# Patient Record
Sex: Female | Born: 1935
Health system: Southern US, Community
[De-identification: ages and names within clinical notes are randomized; demographics above are authoritative.]

## PROBLEM LIST (undated history)

## (undated) DIAGNOSIS — C439 Malignant melanoma of skin, unspecified: Secondary | ICD-10-CM

## (undated) DIAGNOSIS — D45 Polycythemia vera: Secondary | ICD-10-CM

## (undated) DIAGNOSIS — I10 Essential (primary) hypertension: Secondary | ICD-10-CM

## (undated) DIAGNOSIS — M810 Age-related osteoporosis without current pathological fracture: Secondary | ICD-10-CM

## (undated) DIAGNOSIS — K219 Gastro-esophageal reflux disease without esophagitis: Secondary | ICD-10-CM

## (undated) DIAGNOSIS — J45909 Unspecified asthma, uncomplicated: Secondary | ICD-10-CM

## (undated) DIAGNOSIS — E7800 Pure hypercholesterolemia, unspecified: Secondary | ICD-10-CM

## (undated) DIAGNOSIS — G4730 Sleep apnea, unspecified: Secondary | ICD-10-CM

## (undated) DIAGNOSIS — E039 Hypothyroidism, unspecified: Secondary | ICD-10-CM

## (undated) DIAGNOSIS — C4490 Unspecified malignant neoplasm of skin, unspecified: Secondary | ICD-10-CM

## (undated) HISTORY — PX: CHOLECYSTECTOMY: SHX55

## (undated) HISTORY — PX: EYE SURGERY: SHX253

## (undated) HISTORY — DX: Age-related osteoporosis without current pathological fracture: M81.0

## (undated) HISTORY — DX: Unspecified malignant neoplasm of skin, unspecified: C44.90

---

## 1986-07-12 HISTORY — PX: BREAST EXCISIONAL BIOPSY: SUR124

## 2001-07-12 HISTORY — PX: BREAST BIOPSY: SHX20

## 2004-07-10 ENCOUNTER — Ambulatory Visit: Payer: Self-pay

## 2005-04-22 ENCOUNTER — Ambulatory Visit: Payer: Self-pay | Admitting: Internal Medicine

## 2005-07-13 ENCOUNTER — Ambulatory Visit: Payer: Self-pay

## 2006-07-19 ENCOUNTER — Ambulatory Visit: Payer: Self-pay | Admitting: Internal Medicine

## 2007-07-27 ENCOUNTER — Ambulatory Visit: Payer: Self-pay | Admitting: Internal Medicine

## 2007-10-11 ENCOUNTER — Ambulatory Visit: Payer: Self-pay | Admitting: Unknown Physician Specialty

## 2008-05-10 ENCOUNTER — Ambulatory Visit: Payer: Self-pay | Admitting: Internal Medicine

## 2008-07-30 ENCOUNTER — Ambulatory Visit: Payer: Self-pay | Admitting: Internal Medicine

## 2009-07-31 ENCOUNTER — Ambulatory Visit: Payer: Self-pay | Admitting: Internal Medicine

## 2010-05-04 ENCOUNTER — Ambulatory Visit: Payer: Self-pay | Admitting: Otolaryngology

## 2010-08-03 ENCOUNTER — Ambulatory Visit: Payer: Self-pay | Admitting: Internal Medicine

## 2011-07-08 ENCOUNTER — Ambulatory Visit: Payer: Self-pay

## 2011-07-16 ENCOUNTER — Ambulatory Visit: Payer: Self-pay

## 2011-07-16 DIAGNOSIS — Z79899 Other long term (current) drug therapy: Secondary | ICD-10-CM | POA: Diagnosis not present

## 2011-07-16 DIAGNOSIS — M810 Age-related osteoporosis without current pathological fracture: Secondary | ICD-10-CM | POA: Diagnosis not present

## 2011-07-16 DIAGNOSIS — N8501 Benign endometrial hyperplasia: Secondary | ICD-10-CM | POA: Diagnosis not present

## 2011-07-16 DIAGNOSIS — G4730 Sleep apnea, unspecified: Secondary | ICD-10-CM | POA: Diagnosis not present

## 2011-07-16 DIAGNOSIS — D269 Other benign neoplasm of uterus, unspecified: Secondary | ICD-10-CM | POA: Diagnosis not present

## 2011-07-16 DIAGNOSIS — Z85820 Personal history of malignant melanoma of skin: Secondary | ICD-10-CM | POA: Diagnosis not present

## 2011-07-16 DIAGNOSIS — E039 Hypothyroidism, unspecified: Secondary | ICD-10-CM | POA: Diagnosis not present

## 2011-07-16 DIAGNOSIS — N840 Polyp of corpus uteri: Secondary | ICD-10-CM | POA: Diagnosis not present

## 2011-07-16 DIAGNOSIS — I10 Essential (primary) hypertension: Secondary | ICD-10-CM | POA: Diagnosis not present

## 2011-07-16 DIAGNOSIS — N879 Dysplasia of cervix uteri, unspecified: Secondary | ICD-10-CM | POA: Diagnosis not present

## 2011-07-16 DIAGNOSIS — J45909 Unspecified asthma, uncomplicated: Secondary | ICD-10-CM | POA: Diagnosis not present

## 2011-07-20 LAB — PATHOLOGY REPORT

## 2011-08-10 ENCOUNTER — Ambulatory Visit: Payer: Self-pay | Admitting: Internal Medicine

## 2011-08-10 DIAGNOSIS — Z1231 Encounter for screening mammogram for malignant neoplasm of breast: Secondary | ICD-10-CM | POA: Diagnosis not present

## 2011-08-24 DIAGNOSIS — J301 Allergic rhinitis due to pollen: Secondary | ICD-10-CM | POA: Diagnosis not present

## 2011-08-24 DIAGNOSIS — E039 Hypothyroidism, unspecified: Secondary | ICD-10-CM | POA: Diagnosis not present

## 2011-08-24 DIAGNOSIS — E782 Mixed hyperlipidemia: Secondary | ICD-10-CM | POA: Diagnosis not present

## 2011-08-24 DIAGNOSIS — K219 Gastro-esophageal reflux disease without esophagitis: Secondary | ICD-10-CM | POA: Diagnosis not present

## 2011-08-24 DIAGNOSIS — J45909 Unspecified asthma, uncomplicated: Secondary | ICD-10-CM | POA: Diagnosis not present

## 2011-08-27 DIAGNOSIS — D239 Other benign neoplasm of skin, unspecified: Secondary | ICD-10-CM | POA: Diagnosis not present

## 2011-08-27 DIAGNOSIS — Z85820 Personal history of malignant melanoma of skin: Secondary | ICD-10-CM | POA: Diagnosis not present

## 2011-08-27 DIAGNOSIS — D1800 Hemangioma unspecified site: Secondary | ICD-10-CM | POA: Diagnosis not present

## 2011-09-23 DIAGNOSIS — G4730 Sleep apnea, unspecified: Secondary | ICD-10-CM | POA: Diagnosis not present

## 2011-09-23 DIAGNOSIS — G4710 Hypersomnia, unspecified: Secondary | ICD-10-CM | POA: Diagnosis not present

## 2011-09-23 DIAGNOSIS — G4720 Circadian rhythm sleep disorder, unspecified type: Secondary | ICD-10-CM | POA: Diagnosis not present

## 2011-10-15 DIAGNOSIS — L570 Actinic keratosis: Secondary | ICD-10-CM | POA: Diagnosis not present

## 2011-10-25 DIAGNOSIS — J449 Chronic obstructive pulmonary disease, unspecified: Secondary | ICD-10-CM | POA: Diagnosis not present

## 2011-10-25 DIAGNOSIS — R0602 Shortness of breath: Secondary | ICD-10-CM | POA: Diagnosis not present

## 2011-11-22 DIAGNOSIS — G4720 Circadian rhythm sleep disorder, unspecified type: Secondary | ICD-10-CM | POA: Diagnosis not present

## 2011-11-22 DIAGNOSIS — G4730 Sleep apnea, unspecified: Secondary | ICD-10-CM | POA: Diagnosis not present

## 2011-11-22 DIAGNOSIS — J449 Chronic obstructive pulmonary disease, unspecified: Secondary | ICD-10-CM | POA: Diagnosis not present

## 2011-11-22 DIAGNOSIS — G4710 Hypersomnia, unspecified: Secondary | ICD-10-CM | POA: Diagnosis not present

## 2011-11-24 DIAGNOSIS — E2839 Other primary ovarian failure: Secondary | ICD-10-CM | POA: Diagnosis not present

## 2011-11-24 DIAGNOSIS — M810 Age-related osteoporosis without current pathological fracture: Secondary | ICD-10-CM | POA: Diagnosis not present

## 2011-11-25 DIAGNOSIS — Z85820 Personal history of malignant melanoma of skin: Secondary | ICD-10-CM | POA: Diagnosis not present

## 2011-11-25 DIAGNOSIS — L906 Striae atrophicae: Secondary | ICD-10-CM | POA: Diagnosis not present

## 2011-12-04 DIAGNOSIS — L02419 Cutaneous abscess of limb, unspecified: Secondary | ICD-10-CM | POA: Diagnosis not present

## 2011-12-04 DIAGNOSIS — L03119 Cellulitis of unspecified part of limb: Secondary | ICD-10-CM | POA: Diagnosis not present

## 2011-12-05 DIAGNOSIS — T300 Burn of unspecified body region, unspecified degree: Secondary | ICD-10-CM | POA: Diagnosis not present

## 2011-12-05 DIAGNOSIS — L259 Unspecified contact dermatitis, unspecified cause: Secondary | ICD-10-CM | POA: Diagnosis not present

## 2011-12-08 DIAGNOSIS — T148 Other injury of unspecified body region: Secondary | ICD-10-CM | POA: Diagnosis not present

## 2011-12-27 DIAGNOSIS — K219 Gastro-esophageal reflux disease without esophagitis: Secondary | ICD-10-CM | POA: Diagnosis not present

## 2011-12-27 DIAGNOSIS — I10 Essential (primary) hypertension: Secondary | ICD-10-CM | POA: Diagnosis not present

## 2011-12-27 DIAGNOSIS — E039 Hypothyroidism, unspecified: Secondary | ICD-10-CM | POA: Diagnosis not present

## 2011-12-27 DIAGNOSIS — E782 Mixed hyperlipidemia: Secondary | ICD-10-CM | POA: Diagnosis not present

## 2012-01-03 DIAGNOSIS — G4730 Sleep apnea, unspecified: Secondary | ICD-10-CM | POA: Diagnosis not present

## 2012-01-03 DIAGNOSIS — J029 Acute pharyngitis, unspecified: Secondary | ICD-10-CM | POA: Diagnosis not present

## 2012-01-03 DIAGNOSIS — E782 Mixed hyperlipidemia: Secondary | ICD-10-CM | POA: Diagnosis not present

## 2012-01-03 DIAGNOSIS — G4710 Hypersomnia, unspecified: Secondary | ICD-10-CM | POA: Diagnosis not present

## 2012-01-03 DIAGNOSIS — I719 Aortic aneurysm of unspecified site, without rupture: Secondary | ICD-10-CM | POA: Diagnosis not present

## 2012-01-03 DIAGNOSIS — D45 Polycythemia vera: Secondary | ICD-10-CM | POA: Diagnosis not present

## 2012-01-10 ENCOUNTER — Ambulatory Visit: Payer: Self-pay | Admitting: Internal Medicine

## 2012-01-10 DIAGNOSIS — D751 Secondary polycythemia: Secondary | ICD-10-CM | POA: Diagnosis not present

## 2012-01-11 ENCOUNTER — Ambulatory Visit: Payer: Self-pay | Admitting: Internal Medicine

## 2012-01-11 DIAGNOSIS — E785 Hyperlipidemia, unspecified: Secondary | ICD-10-CM | POA: Diagnosis not present

## 2012-01-11 DIAGNOSIS — D582 Other hemoglobinopathies: Secondary | ICD-10-CM | POA: Diagnosis not present

## 2012-01-11 DIAGNOSIS — D751 Secondary polycythemia: Secondary | ICD-10-CM | POA: Diagnosis not present

## 2012-01-11 DIAGNOSIS — E039 Hypothyroidism, unspecified: Secondary | ICD-10-CM | POA: Diagnosis not present

## 2012-01-11 DIAGNOSIS — M810 Age-related osteoporosis without current pathological fracture: Secondary | ICD-10-CM | POA: Diagnosis not present

## 2012-01-11 DIAGNOSIS — G4730 Sleep apnea, unspecified: Secondary | ICD-10-CM | POA: Diagnosis not present

## 2012-01-11 DIAGNOSIS — I10 Essential (primary) hypertension: Secondary | ICD-10-CM | POA: Diagnosis not present

## 2012-01-11 DIAGNOSIS — Z79899 Other long term (current) drug therapy: Secondary | ICD-10-CM | POA: Diagnosis not present

## 2012-01-11 DIAGNOSIS — J449 Chronic obstructive pulmonary disease, unspecified: Secondary | ICD-10-CM | POA: Diagnosis not present

## 2012-01-11 LAB — CBC CANCER CENTER
Basophil %: 0.7 %
Eosinophil %: 1.6 %
HGB: 16.1 g/dL — ABNORMAL HIGH (ref 12.0–16.0)
Monocyte %: 9.5 %
Neutrophil %: 58.6 %
Platelet: 205 x10 3/mm (ref 150–440)
RBC: 5.08 10*6/uL (ref 3.80–5.20)
WBC: 8.3 x10 3/mm (ref 3.6–11.0)

## 2012-01-11 LAB — IRON AND TIBC
Iron: 86 ug/dL (ref 50–170)
Unbound Iron-Bind.Cap.: 295 ug/dL

## 2012-01-11 LAB — FERRITIN: Ferritin (ARMC): 35 ng/mL (ref 8–388)

## 2012-01-18 ENCOUNTER — Ambulatory Visit: Payer: Self-pay | Admitting: Internal Medicine

## 2012-01-18 DIAGNOSIS — J449 Chronic obstructive pulmonary disease, unspecified: Secondary | ICD-10-CM | POA: Diagnosis not present

## 2012-01-18 DIAGNOSIS — G4730 Sleep apnea, unspecified: Secondary | ICD-10-CM | POA: Diagnosis not present

## 2012-01-18 DIAGNOSIS — D45 Polycythemia vera: Secondary | ICD-10-CM | POA: Diagnosis not present

## 2012-01-18 DIAGNOSIS — G4720 Circadian rhythm sleep disorder, unspecified type: Secondary | ICD-10-CM | POA: Diagnosis not present

## 2012-01-18 DIAGNOSIS — G4710 Hypersomnia, unspecified: Secondary | ICD-10-CM | POA: Diagnosis not present

## 2012-01-18 DIAGNOSIS — R0602 Shortness of breath: Secondary | ICD-10-CM | POA: Diagnosis not present

## 2012-01-18 DIAGNOSIS — R079 Chest pain, unspecified: Secondary | ICD-10-CM | POA: Diagnosis not present

## 2012-01-31 DIAGNOSIS — J45909 Unspecified asthma, uncomplicated: Secondary | ICD-10-CM | POA: Diagnosis not present

## 2012-02-09 DIAGNOSIS — D751 Secondary polycythemia: Secondary | ICD-10-CM | POA: Diagnosis not present

## 2012-02-09 DIAGNOSIS — D582 Other hemoglobinopathies: Secondary | ICD-10-CM | POA: Diagnosis not present

## 2012-02-09 DIAGNOSIS — J449 Chronic obstructive pulmonary disease, unspecified: Secondary | ICD-10-CM | POA: Diagnosis not present

## 2012-02-09 DIAGNOSIS — G4730 Sleep apnea, unspecified: Secondary | ICD-10-CM | POA: Diagnosis not present

## 2012-02-09 DIAGNOSIS — E785 Hyperlipidemia, unspecified: Secondary | ICD-10-CM | POA: Diagnosis not present

## 2012-02-09 DIAGNOSIS — I10 Essential (primary) hypertension: Secondary | ICD-10-CM | POA: Diagnosis not present

## 2012-02-10 ENCOUNTER — Ambulatory Visit: Payer: Self-pay | Admitting: Internal Medicine

## 2012-02-10 DIAGNOSIS — J449 Chronic obstructive pulmonary disease, unspecified: Secondary | ICD-10-CM | POA: Diagnosis not present

## 2012-02-13 ENCOUNTER — Emergency Department: Payer: Self-pay | Admitting: Unknown Physician Specialty

## 2012-02-13 DIAGNOSIS — E039 Hypothyroidism, unspecified: Secondary | ICD-10-CM | POA: Diagnosis not present

## 2012-02-13 DIAGNOSIS — R319 Hematuria, unspecified: Secondary | ICD-10-CM | POA: Diagnosis not present

## 2012-02-13 DIAGNOSIS — I10 Essential (primary) hypertension: Secondary | ICD-10-CM | POA: Diagnosis not present

## 2012-02-13 DIAGNOSIS — Z79899 Other long term (current) drug therapy: Secondary | ICD-10-CM | POA: Diagnosis not present

## 2012-02-13 LAB — CBC WITH DIFFERENTIAL/PLATELET
Basophil %: 1.5 %
HCT: 47.7 % — ABNORMAL HIGH (ref 35.0–47.0)
Lymphocyte #: 1.6 10*3/uL (ref 1.0–3.6)
Lymphocyte %: 12.2 %
MCH: 31.5 pg (ref 26.0–34.0)
MCHC: 33.5 g/dL (ref 32.0–36.0)
MCV: 94 fL (ref 80–100)
Monocyte %: 7.5 %
Neutrophil #: 10.2 10*3/uL — ABNORMAL HIGH (ref 1.4–6.5)
RDW: 14 % (ref 11.5–14.5)
WBC: 13 10*3/uL — ABNORMAL HIGH (ref 3.6–11.0)

## 2012-02-13 LAB — BASIC METABOLIC PANEL
Anion Gap: 8 (ref 7–16)
Calcium, Total: 9.3 mg/dL (ref 8.5–10.1)
Chloride: 99 mmol/L (ref 98–107)
Co2: 29 mmol/L (ref 21–32)
EGFR (African American): >60
Sodium: 136 mmol/L (ref 136–145)

## 2012-02-14 ENCOUNTER — Ambulatory Visit: Payer: Self-pay | Admitting: Internal Medicine

## 2012-02-14 DIAGNOSIS — D751 Secondary polycythemia: Secondary | ICD-10-CM | POA: Diagnosis not present

## 2012-02-14 DIAGNOSIS — E785 Hyperlipidemia, unspecified: Secondary | ICD-10-CM | POA: Diagnosis not present

## 2012-02-14 DIAGNOSIS — I10 Essential (primary) hypertension: Secondary | ICD-10-CM | POA: Diagnosis not present

## 2012-02-14 DIAGNOSIS — M810 Age-related osteoporosis without current pathological fracture: Secondary | ICD-10-CM | POA: Diagnosis not present

## 2012-02-14 DIAGNOSIS — J449 Chronic obstructive pulmonary disease, unspecified: Secondary | ICD-10-CM | POA: Diagnosis not present

## 2012-02-14 DIAGNOSIS — Z79899 Other long term (current) drug therapy: Secondary | ICD-10-CM | POA: Diagnosis not present

## 2012-02-14 DIAGNOSIS — E039 Hypothyroidism, unspecified: Secondary | ICD-10-CM | POA: Diagnosis not present

## 2012-02-16 DIAGNOSIS — D751 Secondary polycythemia: Secondary | ICD-10-CM | POA: Diagnosis not present

## 2012-02-16 DIAGNOSIS — D45 Polycythemia vera: Secondary | ICD-10-CM | POA: Diagnosis not present

## 2012-02-16 DIAGNOSIS — I10 Essential (primary) hypertension: Secondary | ICD-10-CM | POA: Diagnosis not present

## 2012-02-16 DIAGNOSIS — E039 Hypothyroidism, unspecified: Secondary | ICD-10-CM | POA: Diagnosis not present

## 2012-02-16 DIAGNOSIS — J449 Chronic obstructive pulmonary disease, unspecified: Secondary | ICD-10-CM | POA: Diagnosis not present

## 2012-02-16 DIAGNOSIS — M810 Age-related osteoporosis without current pathological fracture: Secondary | ICD-10-CM | POA: Diagnosis not present

## 2012-02-16 DIAGNOSIS — E785 Hyperlipidemia, unspecified: Secondary | ICD-10-CM | POA: Diagnosis not present

## 2012-02-16 LAB — CANCER CENTER HEMATOCRIT: HCT: 44.3 % (ref 35.0–47.0)

## 2012-02-24 DIAGNOSIS — M810 Age-related osteoporosis without current pathological fracture: Secondary | ICD-10-CM | POA: Diagnosis not present

## 2012-03-01 DIAGNOSIS — J449 Chronic obstructive pulmonary disease, unspecified: Secondary | ICD-10-CM | POA: Diagnosis not present

## 2012-03-01 DIAGNOSIS — E785 Hyperlipidemia, unspecified: Secondary | ICD-10-CM | POA: Diagnosis not present

## 2012-03-01 DIAGNOSIS — I10 Essential (primary) hypertension: Secondary | ICD-10-CM | POA: Diagnosis not present

## 2012-03-01 DIAGNOSIS — M810 Age-related osteoporosis without current pathological fracture: Secondary | ICD-10-CM | POA: Diagnosis not present

## 2012-03-01 DIAGNOSIS — E039 Hypothyroidism, unspecified: Secondary | ICD-10-CM | POA: Diagnosis not present

## 2012-03-01 DIAGNOSIS — D751 Secondary polycythemia: Secondary | ICD-10-CM | POA: Diagnosis not present

## 2012-03-01 LAB — URINALYSIS, COMPLETE
Bacteria: NONE SEEN
Blood: NEGATIVE
Ketone: NEGATIVE
Leukocyte Esterase: NEGATIVE
Nitrite: NEGATIVE
Ph: 6 (ref 4.5–8.0)
Protein: NEGATIVE
Specific Gravity: 1.01 (ref 1.003–1.030)
Transitional Epi: <1

## 2012-03-02 LAB — URINE CULTURE

## 2012-03-09 DIAGNOSIS — J45909 Unspecified asthma, uncomplicated: Secondary | ICD-10-CM | POA: Diagnosis not present

## 2012-03-12 ENCOUNTER — Ambulatory Visit: Payer: Self-pay | Admitting: Internal Medicine

## 2012-03-12 DIAGNOSIS — Z79899 Other long term (current) drug therapy: Secondary | ICD-10-CM | POA: Diagnosis not present

## 2012-03-12 DIAGNOSIS — D751 Secondary polycythemia: Secondary | ICD-10-CM | POA: Diagnosis not present

## 2012-03-12 DIAGNOSIS — I10 Essential (primary) hypertension: Secondary | ICD-10-CM | POA: Diagnosis not present

## 2012-03-12 DIAGNOSIS — J449 Chronic obstructive pulmonary disease, unspecified: Secondary | ICD-10-CM | POA: Diagnosis not present

## 2012-03-12 DIAGNOSIS — E039 Hypothyroidism, unspecified: Secondary | ICD-10-CM | POA: Diagnosis not present

## 2012-03-12 DIAGNOSIS — E785 Hyperlipidemia, unspecified: Secondary | ICD-10-CM | POA: Diagnosis not present

## 2012-03-12 DIAGNOSIS — M810 Age-related osteoporosis without current pathological fracture: Secondary | ICD-10-CM | POA: Diagnosis not present

## 2012-03-16 DIAGNOSIS — D751 Secondary polycythemia: Secondary | ICD-10-CM | POA: Diagnosis not present

## 2012-03-16 LAB — CANCER CENTER HEMATOCRIT: HCT: 46.6 % (ref 35.0–47.0)

## 2012-03-29 DIAGNOSIS — D1801 Hemangioma of skin and subcutaneous tissue: Secondary | ICD-10-CM | POA: Diagnosis not present

## 2012-03-29 DIAGNOSIS — Z85820 Personal history of malignant melanoma of skin: Secondary | ICD-10-CM | POA: Diagnosis not present

## 2012-03-30 DIAGNOSIS — D751 Secondary polycythemia: Secondary | ICD-10-CM | POA: Diagnosis not present

## 2012-03-30 LAB — CANCER CENTER HEMATOCRIT: HCT: 51.2 % — ABNORMAL HIGH (ref 35.0–47.0)

## 2012-04-07 DIAGNOSIS — R7989 Other specified abnormal findings of blood chemistry: Secondary | ICD-10-CM | POA: Diagnosis not present

## 2012-04-07 DIAGNOSIS — I10 Essential (primary) hypertension: Secondary | ICD-10-CM | POA: Diagnosis not present

## 2012-04-11 ENCOUNTER — Ambulatory Visit: Payer: Self-pay | Admitting: Internal Medicine

## 2012-04-11 DIAGNOSIS — E785 Hyperlipidemia, unspecified: Secondary | ICD-10-CM | POA: Diagnosis not present

## 2012-04-11 DIAGNOSIS — E039 Hypothyroidism, unspecified: Secondary | ICD-10-CM | POA: Diagnosis not present

## 2012-04-11 DIAGNOSIS — D751 Secondary polycythemia: Secondary | ICD-10-CM | POA: Diagnosis not present

## 2012-04-11 DIAGNOSIS — I739 Peripheral vascular disease, unspecified: Secondary | ICD-10-CM | POA: Diagnosis not present

## 2012-04-11 DIAGNOSIS — I10 Essential (primary) hypertension: Secondary | ICD-10-CM | POA: Diagnosis not present

## 2012-04-11 DIAGNOSIS — G4730 Sleep apnea, unspecified: Secondary | ICD-10-CM | POA: Diagnosis not present

## 2012-04-11 DIAGNOSIS — M810 Age-related osteoporosis without current pathological fracture: Secondary | ICD-10-CM | POA: Diagnosis not present

## 2012-04-11 DIAGNOSIS — J449 Chronic obstructive pulmonary disease, unspecified: Secondary | ICD-10-CM | POA: Diagnosis not present

## 2012-04-11 DIAGNOSIS — Z79899 Other long term (current) drug therapy: Secondary | ICD-10-CM | POA: Diagnosis not present

## 2012-04-12 DIAGNOSIS — K219 Gastro-esophageal reflux disease without esophagitis: Secondary | ICD-10-CM | POA: Diagnosis not present

## 2012-04-12 DIAGNOSIS — R220 Localized swelling, mass and lump, head: Secondary | ICD-10-CM | POA: Diagnosis not present

## 2012-04-12 DIAGNOSIS — R490 Dysphonia: Secondary | ICD-10-CM | POA: Diagnosis not present

## 2012-04-12 DIAGNOSIS — J060 Acute laryngopharyngitis: Secondary | ICD-10-CM | POA: Diagnosis not present

## 2012-04-21 DIAGNOSIS — Z23 Encounter for immunization: Secondary | ICD-10-CM | POA: Diagnosis not present

## 2012-04-21 LAB — CBC CANCER CENTER
Basophil #: 0.2 x10 3/mm — ABNORMAL HIGH (ref 0.0–0.1)
Basophil %: 2.4 %
Lymphocyte %: 42.5 %
MCH: 30.4 pg (ref 26.0–34.0)
MCHC: 32.4 g/dL (ref 32.0–36.0)
MCV: 94 fL (ref 80–100)
Monocyte #: 1.1 x10 3/mm — ABNORMAL HIGH (ref 0.2–0.9)
Monocyte %: 14.8 %
Neutrophil %: 38 %
Platelet: 282 x10 3/mm (ref 150–440)
RBC: 4.92 10*6/uL (ref 3.80–5.20)

## 2012-04-24 ENCOUNTER — Ambulatory Visit: Payer: Self-pay | Admitting: Nephrology

## 2012-04-24 DIAGNOSIS — D45 Polycythemia vera: Secondary | ICD-10-CM | POA: Diagnosis not present

## 2012-04-24 DIAGNOSIS — N854 Malposition of uterus: Secondary | ICD-10-CM | POA: Diagnosis not present

## 2012-04-24 DIAGNOSIS — D751 Secondary polycythemia: Secondary | ICD-10-CM | POA: Diagnosis not present

## 2012-04-26 DIAGNOSIS — M810 Age-related osteoporosis without current pathological fracture: Secondary | ICD-10-CM | POA: Diagnosis not present

## 2012-05-05 DIAGNOSIS — R7989 Other specified abnormal findings of blood chemistry: Secondary | ICD-10-CM | POA: Diagnosis not present

## 2012-05-05 DIAGNOSIS — I10 Essential (primary) hypertension: Secondary | ICD-10-CM | POA: Diagnosis not present

## 2012-05-09 DIAGNOSIS — N390 Urinary tract infection, site not specified: Secondary | ICD-10-CM | POA: Diagnosis not present

## 2012-05-09 DIAGNOSIS — R300 Dysuria: Secondary | ICD-10-CM | POA: Diagnosis not present

## 2012-05-11 DIAGNOSIS — D751 Secondary polycythemia: Secondary | ICD-10-CM | POA: Diagnosis not present

## 2012-05-11 DIAGNOSIS — J449 Chronic obstructive pulmonary disease, unspecified: Secondary | ICD-10-CM | POA: Diagnosis not present

## 2012-05-11 DIAGNOSIS — G4730 Sleep apnea, unspecified: Secondary | ICD-10-CM | POA: Diagnosis not present

## 2012-05-11 LAB — CBC CANCER CENTER
Basophil %: 0.8 %
Eosinophil #: 0.2 x10 3/mm (ref 0.0–0.7)
Eosinophil %: 2.8 %
HGB: 14.3 g/dL (ref 12.0–16.0)
Lymphocyte #: 2.1 x10 3/mm (ref 1.0–3.6)
Lymphocyte %: 30.2 %
Monocyte %: 11.3 %
Neutrophil %: 54.9 %
RBC: 4.77 10*6/uL (ref 3.80–5.20)
WBC: 6.9 x10 3/mm (ref 3.6–11.0)

## 2012-05-12 ENCOUNTER — Ambulatory Visit: Payer: Self-pay | Admitting: Internal Medicine

## 2012-05-26 DIAGNOSIS — R300 Dysuria: Secondary | ICD-10-CM | POA: Diagnosis not present

## 2012-05-26 DIAGNOSIS — N390 Urinary tract infection, site not specified: Secondary | ICD-10-CM | POA: Diagnosis not present

## 2012-06-11 ENCOUNTER — Ambulatory Visit: Payer: Self-pay | Admitting: Internal Medicine

## 2012-06-11 DIAGNOSIS — D751 Secondary polycythemia: Secondary | ICD-10-CM | POA: Diagnosis not present

## 2012-06-12 DIAGNOSIS — D45 Polycythemia vera: Secondary | ICD-10-CM | POA: Diagnosis not present

## 2012-06-12 DIAGNOSIS — G4720 Circadian rhythm sleep disorder, unspecified type: Secondary | ICD-10-CM | POA: Diagnosis not present

## 2012-06-12 DIAGNOSIS — J45909 Unspecified asthma, uncomplicated: Secondary | ICD-10-CM | POA: Diagnosis not present

## 2012-06-12 DIAGNOSIS — J449 Chronic obstructive pulmonary disease, unspecified: Secondary | ICD-10-CM | POA: Diagnosis not present

## 2012-06-20 DIAGNOSIS — M810 Age-related osteoporosis without current pathological fracture: Secondary | ICD-10-CM | POA: Diagnosis not present

## 2012-06-20 DIAGNOSIS — K219 Gastro-esophageal reflux disease without esophagitis: Secondary | ICD-10-CM | POA: Diagnosis not present

## 2012-06-20 DIAGNOSIS — I10 Essential (primary) hypertension: Secondary | ICD-10-CM | POA: Diagnosis not present

## 2012-06-20 DIAGNOSIS — E039 Hypothyroidism, unspecified: Secondary | ICD-10-CM | POA: Diagnosis not present

## 2012-06-26 LAB — CANCER CENTER HEMATOCRIT: HCT: 44.6 % (ref 35.0–47.0)

## 2012-06-30 DIAGNOSIS — R059 Cough, unspecified: Secondary | ICD-10-CM | POA: Diagnosis not present

## 2012-06-30 DIAGNOSIS — R05 Cough: Secondary | ICD-10-CM | POA: Diagnosis not present

## 2012-06-30 DIAGNOSIS — J029 Acute pharyngitis, unspecified: Secondary | ICD-10-CM | POA: Diagnosis not present

## 2012-07-03 DIAGNOSIS — J209 Acute bronchitis, unspecified: Secondary | ICD-10-CM | POA: Diagnosis not present

## 2012-07-03 DIAGNOSIS — R059 Cough, unspecified: Secondary | ICD-10-CM | POA: Diagnosis not present

## 2012-07-03 DIAGNOSIS — R05 Cough: Secondary | ICD-10-CM | POA: Diagnosis not present

## 2012-07-12 ENCOUNTER — Ambulatory Visit: Payer: Self-pay | Admitting: Internal Medicine

## 2012-07-14 ENCOUNTER — Emergency Department: Payer: Self-pay | Admitting: Unknown Physician Specialty

## 2012-07-14 DIAGNOSIS — R109 Unspecified abdominal pain: Secondary | ICD-10-CM | POA: Diagnosis not present

## 2012-07-14 DIAGNOSIS — F17200 Nicotine dependence, unspecified, uncomplicated: Secondary | ICD-10-CM | POA: Diagnosis not present

## 2012-07-14 DIAGNOSIS — R6889 Other general symptoms and signs: Secondary | ICD-10-CM | POA: Diagnosis not present

## 2012-07-14 DIAGNOSIS — E869 Volume depletion, unspecified: Secondary | ICD-10-CM | POA: Diagnosis not present

## 2012-07-14 DIAGNOSIS — I10 Essential (primary) hypertension: Secondary | ICD-10-CM | POA: Diagnosis not present

## 2012-07-14 DIAGNOSIS — R112 Nausea with vomiting, unspecified: Secondary | ICD-10-CM | POA: Diagnosis not present

## 2012-07-14 DIAGNOSIS — R1110 Vomiting, unspecified: Secondary | ICD-10-CM | POA: Diagnosis not present

## 2012-07-14 DIAGNOSIS — R197 Diarrhea, unspecified: Secondary | ICD-10-CM | POA: Diagnosis not present

## 2012-07-14 DIAGNOSIS — R9431 Abnormal electrocardiogram [ECG] [EKG]: Secondary | ICD-10-CM | POA: Diagnosis not present

## 2012-07-14 DIAGNOSIS — G4730 Sleep apnea, unspecified: Secondary | ICD-10-CM | POA: Diagnosis not present

## 2012-07-14 DIAGNOSIS — Z9889 Other specified postprocedural states: Secondary | ICD-10-CM | POA: Diagnosis not present

## 2012-07-14 DIAGNOSIS — R5381 Other malaise: Secondary | ICD-10-CM | POA: Diagnosis not present

## 2012-07-14 LAB — URINALYSIS, COMPLETE
Bacteria: NONE SEEN
Bilirubin,UR: NEGATIVE
Ketone: NEGATIVE
Nitrite: NEGATIVE
Specific Gravity: 1.005 (ref 1.003–1.030)
Squamous Epithelial: <1
WBC UR: 17 /HPF (ref 0–5)

## 2012-07-14 LAB — COMPREHENSIVE METABOLIC PANEL
Albumin: 3.3 g/dL — ABNORMAL LOW (ref 3.4–5.0)
Anion Gap: 6 — ABNORMAL LOW (ref 7–16)
BUN: 18 mg/dL (ref 7–18)
Calcium, Total: 8.5 mg/dL (ref 8.5–10.1)
Co2: 29 mmol/L (ref 21–32)
Creatinine: 0.92 mg/dL (ref 0.60–1.30)
EGFR (African American): >60
Glucose: 116 mg/dL — ABNORMAL HIGH (ref 65–99)
Osmolality: 280 (ref 275–301)
Potassium: 4.2 mmol/L (ref 3.5–5.1)
SGPT (ALT): 25 U/L (ref 12–78)
Sodium: 139 mmol/L (ref 136–145)
Total Protein: 7.5 g/dL (ref 6.4–8.2)

## 2012-07-14 LAB — CBC
HCT: 45.8 % (ref 35.0–47.0)
MCH: 29.2 pg (ref 26.0–34.0)
MCHC: 32.6 g/dL (ref 32.0–36.0)
MCV: 90 fL (ref 80–100)
Platelet: 280 10*3/uL (ref 150–440)
RBC: 5.11 10*6/uL (ref 3.80–5.20)
RDW: 14.4 % (ref 11.5–14.5)

## 2012-07-14 LAB — CK TOTAL AND CKMB (NOT AT ARMC): CK-MB: 0.6 ng/mL (ref 0.5–3.6)

## 2012-07-17 ENCOUNTER — Ambulatory Visit: Payer: Self-pay | Admitting: Internal Medicine

## 2012-07-17 DIAGNOSIS — I10 Essential (primary) hypertension: Secondary | ICD-10-CM | POA: Diagnosis not present

## 2012-07-17 DIAGNOSIS — M810 Age-related osteoporosis without current pathological fracture: Secondary | ICD-10-CM | POA: Diagnosis not present

## 2012-07-17 DIAGNOSIS — J449 Chronic obstructive pulmonary disease, unspecified: Secondary | ICD-10-CM | POA: Diagnosis not present

## 2012-07-17 DIAGNOSIS — D751 Secondary polycythemia: Secondary | ICD-10-CM | POA: Diagnosis not present

## 2012-07-17 DIAGNOSIS — E785 Hyperlipidemia, unspecified: Secondary | ICD-10-CM | POA: Diagnosis not present

## 2012-07-17 DIAGNOSIS — E039 Hypothyroidism, unspecified: Secondary | ICD-10-CM | POA: Diagnosis not present

## 2012-07-17 DIAGNOSIS — Z79899 Other long term (current) drug therapy: Secondary | ICD-10-CM | POA: Diagnosis not present

## 2012-07-17 LAB — CBC CANCER CENTER
Basophil #: 0.1 x10 3/mm (ref 0.0–0.1)
Eosinophil %: 1.7 %
HCT: 44.1 % (ref 35.0–47.0)
Lymphocyte #: 1.7 x10 3/mm (ref 1.0–3.6)
Lymphocyte %: 12.7 %
MCV: 90 fL (ref 80–100)
Monocyte #: 1.2 x10 3/mm — ABNORMAL HIGH (ref 0.2–0.9)
Monocyte %: 8.9 %
Neutrophil #: 10.2 x10 3/mm — ABNORMAL HIGH (ref 1.4–6.5)
RBC: 4.91 10*6/uL (ref 3.80–5.20)
WBC: 13.4 x10 3/mm — ABNORMAL HIGH (ref 3.6–11.0)

## 2012-07-27 DIAGNOSIS — L708 Other acne: Secondary | ICD-10-CM | POA: Diagnosis not present

## 2012-07-27 DIAGNOSIS — Z85820 Personal history of malignant melanoma of skin: Secondary | ICD-10-CM | POA: Diagnosis not present

## 2012-07-27 DIAGNOSIS — D1801 Hemangioma of skin and subcutaneous tissue: Secondary | ICD-10-CM | POA: Diagnosis not present

## 2012-08-07 DIAGNOSIS — D751 Secondary polycythemia: Secondary | ICD-10-CM | POA: Diagnosis not present

## 2012-08-07 DIAGNOSIS — E785 Hyperlipidemia, unspecified: Secondary | ICD-10-CM | POA: Diagnosis not present

## 2012-08-07 DIAGNOSIS — M810 Age-related osteoporosis without current pathological fracture: Secondary | ICD-10-CM | POA: Diagnosis not present

## 2012-08-07 DIAGNOSIS — I10 Essential (primary) hypertension: Secondary | ICD-10-CM | POA: Diagnosis not present

## 2012-08-07 DIAGNOSIS — E039 Hypothyroidism, unspecified: Secondary | ICD-10-CM | POA: Diagnosis not present

## 2012-08-07 DIAGNOSIS — J449 Chronic obstructive pulmonary disease, unspecified: Secondary | ICD-10-CM | POA: Diagnosis not present

## 2012-08-07 LAB — CBC CANCER CENTER
Basophil #: 0.1 x10 3/mm (ref 0.0–0.1)
Basophil %: 1 %
Eosinophil #: 0.1 x10 3/mm (ref 0.0–0.7)
HCT: 46.1 % (ref 35.0–47.0)
MCH: 28.9 pg (ref 26.0–34.0)
Monocyte #: 0.8 x10 3/mm (ref 0.2–0.9)
Neutrophil #: 3.4 x10 3/mm (ref 1.4–6.5)
Platelet: 251 x10 3/mm (ref 150–440)
WBC: 6.9 x10 3/mm (ref 3.6–11.0)

## 2012-08-11 ENCOUNTER — Ambulatory Visit: Payer: Self-pay | Admitting: Internal Medicine

## 2012-08-11 DIAGNOSIS — Z1231 Encounter for screening mammogram for malignant neoplasm of breast: Secondary | ICD-10-CM | POA: Diagnosis not present

## 2012-08-12 ENCOUNTER — Ambulatory Visit: Payer: Self-pay | Admitting: Internal Medicine

## 2012-08-12 ENCOUNTER — Ambulatory Visit: Payer: Self-pay

## 2012-08-12 DIAGNOSIS — I10 Essential (primary) hypertension: Secondary | ICD-10-CM | POA: Diagnosis not present

## 2012-08-12 DIAGNOSIS — J449 Chronic obstructive pulmonary disease, unspecified: Secondary | ICD-10-CM | POA: Diagnosis not present

## 2012-08-12 DIAGNOSIS — D751 Secondary polycythemia: Secondary | ICD-10-CM | POA: Diagnosis not present

## 2012-08-12 DIAGNOSIS — E039 Hypothyroidism, unspecified: Secondary | ICD-10-CM | POA: Diagnosis not present

## 2012-08-12 DIAGNOSIS — E785 Hyperlipidemia, unspecified: Secondary | ICD-10-CM | POA: Diagnosis not present

## 2012-08-12 DIAGNOSIS — M810 Age-related osteoporosis without current pathological fracture: Secondary | ICD-10-CM | POA: Diagnosis not present

## 2012-08-12 DIAGNOSIS — Z79899 Other long term (current) drug therapy: Secondary | ICD-10-CM | POA: Diagnosis not present

## 2012-09-04 LAB — CBC CANCER CENTER
Basophil #: 0.1 x10 3/mm (ref 0.0–0.1)
Basophil %: 0.9 %
HGB: 15.1 g/dL (ref 12.0–16.0)
Lymphocyte %: 32 %
MCHC: 33.1 g/dL (ref 32.0–36.0)
MCV: 91 fL (ref 80–100)
Monocyte #: 0.6 x10 3/mm (ref 0.2–0.9)
Monocyte %: 7.9 %
Neutrophil %: 58.2 %
RBC: 5.04 10*6/uL (ref 3.80–5.20)
WBC: 7.2 x10 3/mm (ref 3.6–11.0)

## 2012-09-09 ENCOUNTER — Ambulatory Visit: Payer: Self-pay

## 2012-09-09 ENCOUNTER — Ambulatory Visit: Payer: Self-pay | Admitting: Internal Medicine

## 2012-09-09 DIAGNOSIS — E785 Hyperlipidemia, unspecified: Secondary | ICD-10-CM | POA: Diagnosis not present

## 2012-09-09 DIAGNOSIS — J449 Chronic obstructive pulmonary disease, unspecified: Secondary | ICD-10-CM | POA: Diagnosis not present

## 2012-09-09 DIAGNOSIS — E039 Hypothyroidism, unspecified: Secondary | ICD-10-CM | POA: Diagnosis not present

## 2012-09-09 DIAGNOSIS — I10 Essential (primary) hypertension: Secondary | ICD-10-CM | POA: Diagnosis not present

## 2012-09-09 DIAGNOSIS — D751 Secondary polycythemia: Secondary | ICD-10-CM | POA: Diagnosis not present

## 2012-09-09 DIAGNOSIS — Z79899 Other long term (current) drug therapy: Secondary | ICD-10-CM | POA: Diagnosis not present

## 2012-09-09 DIAGNOSIS — M810 Age-related osteoporosis without current pathological fracture: Secondary | ICD-10-CM | POA: Diagnosis not present

## 2012-10-10 ENCOUNTER — Ambulatory Visit: Payer: Self-pay | Admitting: Internal Medicine

## 2012-10-19 DIAGNOSIS — J309 Allergic rhinitis, unspecified: Secondary | ICD-10-CM | POA: Diagnosis not present

## 2012-10-19 DIAGNOSIS — I10 Essential (primary) hypertension: Secondary | ICD-10-CM | POA: Diagnosis not present

## 2012-10-19 DIAGNOSIS — E782 Mixed hyperlipidemia: Secondary | ICD-10-CM | POA: Diagnosis not present

## 2012-10-19 DIAGNOSIS — M818 Other osteoporosis without current pathological fracture: Secondary | ICD-10-CM | POA: Diagnosis not present

## 2012-10-19 DIAGNOSIS — M545 Low back pain, unspecified: Secondary | ICD-10-CM | POA: Diagnosis not present

## 2012-10-19 DIAGNOSIS — Z006 Encounter for examination for normal comparison and control in clinical research program: Secondary | ICD-10-CM | POA: Diagnosis not present

## 2012-10-21 DIAGNOSIS — T148 Other injury of unspecified body region: Secondary | ICD-10-CM | POA: Diagnosis not present

## 2012-10-21 DIAGNOSIS — W57XXXA Bitten or stung by nonvenomous insect and other nonvenomous arthropods, initial encounter: Secondary | ICD-10-CM | POA: Diagnosis not present

## 2012-11-29 ENCOUNTER — Ambulatory Visit: Payer: Self-pay | Admitting: Internal Medicine

## 2012-11-29 DIAGNOSIS — D751 Secondary polycythemia: Secondary | ICD-10-CM | POA: Diagnosis not present

## 2012-11-30 DIAGNOSIS — J069 Acute upper respiratory infection, unspecified: Secondary | ICD-10-CM | POA: Diagnosis not present

## 2012-11-30 DIAGNOSIS — J301 Allergic rhinitis due to pollen: Secondary | ICD-10-CM | POA: Diagnosis not present

## 2012-11-30 DIAGNOSIS — D751 Secondary polycythemia: Secondary | ICD-10-CM | POA: Diagnosis not present

## 2012-12-10 ENCOUNTER — Ambulatory Visit: Payer: Self-pay | Admitting: Internal Medicine

## 2012-12-11 DIAGNOSIS — J449 Chronic obstructive pulmonary disease, unspecified: Secondary | ICD-10-CM | POA: Diagnosis not present

## 2012-12-21 DIAGNOSIS — L570 Actinic keratosis: Secondary | ICD-10-CM | POA: Diagnosis not present

## 2012-12-21 DIAGNOSIS — Z85820 Personal history of malignant melanoma of skin: Secondary | ICD-10-CM | POA: Diagnosis not present

## 2012-12-25 DIAGNOSIS — I10 Essential (primary) hypertension: Secondary | ICD-10-CM | POA: Diagnosis not present

## 2012-12-25 DIAGNOSIS — E782 Mixed hyperlipidemia: Secondary | ICD-10-CM | POA: Diagnosis not present

## 2012-12-25 DIAGNOSIS — E039 Hypothyroidism, unspecified: Secondary | ICD-10-CM | POA: Diagnosis not present

## 2012-12-25 DIAGNOSIS — Z79899 Other long term (current) drug therapy: Secondary | ICD-10-CM | POA: Diagnosis not present

## 2012-12-26 DIAGNOSIS — H5200 Hypermetropia, unspecified eye: Secondary | ICD-10-CM | POA: Diagnosis not present

## 2012-12-26 DIAGNOSIS — H52229 Regular astigmatism, unspecified eye: Secondary | ICD-10-CM | POA: Diagnosis not present

## 2012-12-26 DIAGNOSIS — H524 Presbyopia: Secondary | ICD-10-CM | POA: Diagnosis not present

## 2012-12-26 DIAGNOSIS — H269 Unspecified cataract: Secondary | ICD-10-CM | POA: Diagnosis not present

## 2013-01-09 ENCOUNTER — Ambulatory Visit: Payer: Self-pay | Admitting: Internal Medicine

## 2013-01-09 DIAGNOSIS — I10 Essential (primary) hypertension: Secondary | ICD-10-CM | POA: Diagnosis not present

## 2013-01-09 DIAGNOSIS — E785 Hyperlipidemia, unspecified: Secondary | ICD-10-CM | POA: Diagnosis not present

## 2013-01-09 DIAGNOSIS — Z7982 Long term (current) use of aspirin: Secondary | ICD-10-CM | POA: Diagnosis not present

## 2013-01-09 DIAGNOSIS — D45 Polycythemia vera: Secondary | ICD-10-CM | POA: Diagnosis not present

## 2013-01-09 DIAGNOSIS — E039 Hypothyroidism, unspecified: Secondary | ICD-10-CM | POA: Diagnosis not present

## 2013-01-09 DIAGNOSIS — Z9089 Acquired absence of other organs: Secondary | ICD-10-CM | POA: Diagnosis not present

## 2013-01-09 DIAGNOSIS — J449 Chronic obstructive pulmonary disease, unspecified: Secondary | ICD-10-CM | POA: Diagnosis not present

## 2013-01-09 DIAGNOSIS — E7800 Pure hypercholesterolemia, unspecified: Secondary | ICD-10-CM | POA: Diagnosis not present

## 2013-01-09 DIAGNOSIS — Z79899 Other long term (current) drug therapy: Secondary | ICD-10-CM | POA: Diagnosis not present

## 2013-01-09 DIAGNOSIS — G4730 Sleep apnea, unspecified: Secondary | ICD-10-CM | POA: Diagnosis not present

## 2013-01-19 DIAGNOSIS — M278 Other specified diseases of jaws: Secondary | ICD-10-CM | POA: Diagnosis not present

## 2013-01-19 DIAGNOSIS — B372 Candidiasis of skin and nail: Secondary | ICD-10-CM | POA: Diagnosis not present

## 2013-01-25 DIAGNOSIS — D45 Polycythemia vera: Secondary | ICD-10-CM | POA: Diagnosis not present

## 2013-02-04 DIAGNOSIS — R319 Hematuria, unspecified: Secondary | ICD-10-CM | POA: Diagnosis not present

## 2013-02-04 DIAGNOSIS — N390 Urinary tract infection, site not specified: Secondary | ICD-10-CM | POA: Diagnosis not present

## 2013-02-09 ENCOUNTER — Ambulatory Visit: Payer: Self-pay | Admitting: Internal Medicine

## 2013-02-09 DIAGNOSIS — Z79899 Other long term (current) drug therapy: Secondary | ICD-10-CM | POA: Diagnosis not present

## 2013-02-09 DIAGNOSIS — E039 Hypothyroidism, unspecified: Secondary | ICD-10-CM | POA: Diagnosis not present

## 2013-02-09 DIAGNOSIS — J449 Chronic obstructive pulmonary disease, unspecified: Secondary | ICD-10-CM | POA: Diagnosis not present

## 2013-02-09 DIAGNOSIS — E785 Hyperlipidemia, unspecified: Secondary | ICD-10-CM | POA: Diagnosis not present

## 2013-02-09 DIAGNOSIS — Z7982 Long term (current) use of aspirin: Secondary | ICD-10-CM | POA: Diagnosis not present

## 2013-02-09 DIAGNOSIS — D45 Polycythemia vera: Secondary | ICD-10-CM | POA: Diagnosis not present

## 2013-02-09 DIAGNOSIS — Z9089 Acquired absence of other organs: Secondary | ICD-10-CM | POA: Diagnosis not present

## 2013-02-09 DIAGNOSIS — I10 Essential (primary) hypertension: Secondary | ICD-10-CM | POA: Diagnosis not present

## 2013-02-09 DIAGNOSIS — G4730 Sleep apnea, unspecified: Secondary | ICD-10-CM | POA: Diagnosis not present

## 2013-02-09 DIAGNOSIS — E7800 Pure hypercholesterolemia, unspecified: Secondary | ICD-10-CM | POA: Diagnosis not present

## 2013-02-15 DIAGNOSIS — M810 Age-related osteoporosis without current pathological fracture: Secondary | ICD-10-CM | POA: Diagnosis not present

## 2013-02-15 DIAGNOSIS — I719 Aortic aneurysm of unspecified site, without rupture: Secondary | ICD-10-CM | POA: Diagnosis not present

## 2013-02-15 DIAGNOSIS — R300 Dysuria: Secondary | ICD-10-CM | POA: Diagnosis not present

## 2013-02-15 DIAGNOSIS — J449 Chronic obstructive pulmonary disease, unspecified: Secondary | ICD-10-CM | POA: Diagnosis not present

## 2013-02-15 DIAGNOSIS — I10 Essential (primary) hypertension: Secondary | ICD-10-CM | POA: Diagnosis not present

## 2013-02-15 DIAGNOSIS — E039 Hypothyroidism, unspecified: Secondary | ICD-10-CM | POA: Diagnosis not present

## 2013-02-15 DIAGNOSIS — Z0000 Encounter for general adult medical examination without abnormal findings: Secondary | ICD-10-CM | POA: Diagnosis not present

## 2013-02-15 DIAGNOSIS — K219 Gastro-esophageal reflux disease without esophagitis: Secondary | ICD-10-CM | POA: Diagnosis not present

## 2013-02-22 LAB — CANCER CENTER HEMATOCRIT: HCT: 46.5 % (ref 35.0–47.0)

## 2013-03-12 ENCOUNTER — Ambulatory Visit: Payer: Self-pay | Admitting: Internal Medicine

## 2013-04-16 DIAGNOSIS — Z23 Encounter for immunization: Secondary | ICD-10-CM | POA: Diagnosis not present

## 2013-04-19 ENCOUNTER — Ambulatory Visit: Payer: Self-pay | Admitting: Internal Medicine

## 2013-04-19 DIAGNOSIS — D45 Polycythemia vera: Secondary | ICD-10-CM | POA: Diagnosis not present

## 2013-04-19 LAB — CBC CANCER CENTER
Basophil %: 0.7 %
Eosinophil #: 0.1 x10 3/mm (ref 0.0–0.7)
Eosinophil %: 1.2 %
HGB: 15.6 g/dL (ref 12.0–16.0)
Lymphocyte #: 2.3 x10 3/mm (ref 1.0–3.6)
Lymphocyte %: 34.7 %
MCH: 30.4 pg (ref 26.0–34.0)
MCHC: 33.1 g/dL (ref 32.0–36.0)
MCV: 92 fL (ref 80–100)
Monocyte #: 0.7 x10 3/mm (ref 0.2–0.9)
Neutrophil #: 3.4 x10 3/mm (ref 1.4–6.5)
Neutrophil %: 52.3 %
RBC: 5.15 10*6/uL (ref 3.80–5.20)
RDW: 14.3 % (ref 11.5–14.5)

## 2013-05-02 DIAGNOSIS — K625 Hemorrhage of anus and rectum: Secondary | ICD-10-CM | POA: Diagnosis not present

## 2013-05-07 DIAGNOSIS — E038 Other specified hypothyroidism: Secondary | ICD-10-CM | POA: Diagnosis not present

## 2013-05-08 DIAGNOSIS — K625 Hemorrhage of anus and rectum: Secondary | ICD-10-CM | POA: Diagnosis not present

## 2013-05-11 DIAGNOSIS — K625 Hemorrhage of anus and rectum: Secondary | ICD-10-CM | POA: Diagnosis not present

## 2013-05-12 ENCOUNTER — Ambulatory Visit: Payer: Self-pay | Admitting: Internal Medicine

## 2013-05-12 DIAGNOSIS — G4730 Sleep apnea, unspecified: Secondary | ICD-10-CM | POA: Diagnosis not present

## 2013-05-12 DIAGNOSIS — E039 Hypothyroidism, unspecified: Secondary | ICD-10-CM | POA: Diagnosis not present

## 2013-05-12 DIAGNOSIS — I10 Essential (primary) hypertension: Secondary | ICD-10-CM | POA: Diagnosis not present

## 2013-05-12 DIAGNOSIS — E785 Hyperlipidemia, unspecified: Secondary | ICD-10-CM | POA: Diagnosis not present

## 2013-05-12 DIAGNOSIS — E7800 Pure hypercholesterolemia, unspecified: Secondary | ICD-10-CM | POA: Diagnosis not present

## 2013-05-12 DIAGNOSIS — Z79899 Other long term (current) drug therapy: Secondary | ICD-10-CM | POA: Diagnosis not present

## 2013-05-12 DIAGNOSIS — Z9089 Acquired absence of other organs: Secondary | ICD-10-CM | POA: Diagnosis not present

## 2013-05-12 DIAGNOSIS — D45 Polycythemia vera: Secondary | ICD-10-CM | POA: Diagnosis not present

## 2013-05-12 DIAGNOSIS — Z7982 Long term (current) use of aspirin: Secondary | ICD-10-CM | POA: Diagnosis not present

## 2013-05-12 DIAGNOSIS — J449 Chronic obstructive pulmonary disease, unspecified: Secondary | ICD-10-CM | POA: Diagnosis not present

## 2013-05-17 DIAGNOSIS — D45 Polycythemia vera: Secondary | ICD-10-CM | POA: Diagnosis not present

## 2013-05-17 LAB — CBC CANCER CENTER
Basophil %: 1.2 %
Eosinophil #: 0.1 x10 3/mm (ref 0.0–0.7)
Eosinophil %: 0.8 %
HCT: 50.2 % — ABNORMAL HIGH (ref 35.0–47.0)
HGB: 16.5 g/dL — ABNORMAL HIGH (ref 12.0–16.0)
Lymphocyte %: 29.8 %
MCH: 30.2 pg (ref 26.0–34.0)
MCV: 92 fL (ref 80–100)
Monocyte #: 0.8 x10 3/mm (ref 0.2–0.9)
RBC: 5.47 10*6/uL — ABNORMAL HIGH (ref 3.80–5.20)
RDW: 13.9 % (ref 11.5–14.5)

## 2013-05-17 LAB — CANCER CENTER HEMATOCRIT: HCT: 48.4 % — ABNORMAL HIGH (ref 35.0–47.0)

## 2013-05-21 DIAGNOSIS — M810 Age-related osteoporosis without current pathological fracture: Secondary | ICD-10-CM | POA: Diagnosis not present

## 2013-05-21 DIAGNOSIS — I10 Essential (primary) hypertension: Secondary | ICD-10-CM | POA: Diagnosis not present

## 2013-05-21 DIAGNOSIS — K219 Gastro-esophageal reflux disease without esophagitis: Secondary | ICD-10-CM | POA: Diagnosis not present

## 2013-05-21 DIAGNOSIS — E039 Hypothyroidism, unspecified: Secondary | ICD-10-CM | POA: Diagnosis not present

## 2013-05-21 DIAGNOSIS — J45909 Unspecified asthma, uncomplicated: Secondary | ICD-10-CM | POA: Diagnosis not present

## 2013-05-21 DIAGNOSIS — J309 Allergic rhinitis, unspecified: Secondary | ICD-10-CM | POA: Diagnosis not present

## 2013-05-23 DIAGNOSIS — Z85820 Personal history of malignant melanoma of skin: Secondary | ICD-10-CM | POA: Diagnosis not present

## 2013-05-23 DIAGNOSIS — L570 Actinic keratosis: Secondary | ICD-10-CM | POA: Diagnosis not present

## 2013-05-23 DIAGNOSIS — L821 Other seborrheic keratosis: Secondary | ICD-10-CM | POA: Diagnosis not present

## 2013-05-23 DIAGNOSIS — D1801 Hemangioma of skin and subcutaneous tissue: Secondary | ICD-10-CM | POA: Diagnosis not present

## 2013-06-01 LAB — CANCER CENTER HEMATOCRIT: HCT: 46.7 % (ref 35.0–47.0)

## 2013-06-11 ENCOUNTER — Ambulatory Visit: Payer: Self-pay | Admitting: Internal Medicine

## 2013-06-28 ENCOUNTER — Ambulatory Visit: Payer: Self-pay | Admitting: Unknown Physician Specialty

## 2013-06-28 DIAGNOSIS — K219 Gastro-esophageal reflux disease without esophagitis: Secondary | ICD-10-CM | POA: Diagnosis not present

## 2013-06-28 DIAGNOSIS — E785 Hyperlipidemia, unspecified: Secondary | ICD-10-CM | POA: Diagnosis not present

## 2013-06-28 DIAGNOSIS — Z882 Allergy status to sulfonamides status: Secondary | ICD-10-CM | POA: Diagnosis not present

## 2013-06-28 DIAGNOSIS — Z881 Allergy status to other antibiotic agents status: Secondary | ICD-10-CM | POA: Diagnosis not present

## 2013-06-28 DIAGNOSIS — Z880 Allergy status to penicillin: Secondary | ICD-10-CM | POA: Diagnosis not present

## 2013-06-28 DIAGNOSIS — J45909 Unspecified asthma, uncomplicated: Secondary | ICD-10-CM | POA: Diagnosis not present

## 2013-06-28 DIAGNOSIS — Z79899 Other long term (current) drug therapy: Secondary | ICD-10-CM | POA: Diagnosis not present

## 2013-06-28 DIAGNOSIS — M810 Age-related osteoporosis without current pathological fracture: Secondary | ICD-10-CM | POA: Diagnosis not present

## 2013-06-28 DIAGNOSIS — Z7982 Long term (current) use of aspirin: Secondary | ICD-10-CM | POA: Diagnosis not present

## 2013-06-28 DIAGNOSIS — E039 Hypothyroidism, unspecified: Secondary | ICD-10-CM | POA: Diagnosis not present

## 2013-06-28 DIAGNOSIS — K648 Other hemorrhoids: Secondary | ICD-10-CM | POA: Diagnosis not present

## 2013-06-28 DIAGNOSIS — N6019 Diffuse cystic mastopathy of unspecified breast: Secondary | ICD-10-CM | POA: Diagnosis not present

## 2013-06-28 DIAGNOSIS — Z1211 Encounter for screening for malignant neoplasm of colon: Secondary | ICD-10-CM | POA: Diagnosis not present

## 2013-06-28 DIAGNOSIS — K625 Hemorrhage of anus and rectum: Secondary | ICD-10-CM | POA: Diagnosis not present

## 2013-06-28 DIAGNOSIS — Z803 Family history of malignant neoplasm of breast: Secondary | ICD-10-CM | POA: Diagnosis not present

## 2013-06-28 DIAGNOSIS — G4730 Sleep apnea, unspecified: Secondary | ICD-10-CM | POA: Diagnosis not present

## 2013-06-28 DIAGNOSIS — D126 Benign neoplasm of colon, unspecified: Secondary | ICD-10-CM | POA: Diagnosis not present

## 2013-07-09 ENCOUNTER — Ambulatory Visit: Payer: Self-pay | Admitting: Internal Medicine

## 2013-07-09 DIAGNOSIS — D45 Polycythemia vera: Secondary | ICD-10-CM | POA: Diagnosis not present

## 2013-07-09 LAB — CBC CANCER CENTER
Basophil #: 0.1 x10 3/mm (ref 0.0–0.1)
Basophil %: 1.1 %
Eosinophil #: 0.1 x10 3/mm (ref 0.0–0.7)
Lymphocyte #: 2.1 x10 3/mm (ref 1.0–3.6)
Lymphocyte %: 43.8 %
MCHC: 32.1 g/dL (ref 32.0–36.0)
MCV: 93 fL (ref 80–100)
Monocyte #: 0.6 x10 3/mm (ref 0.2–0.9)
Monocyte %: 11.4 %
Neutrophil #: 2 x10 3/mm (ref 1.4–6.5)
Neutrophil %: 41.2 %
RBC: 4.82 10*6/uL (ref 3.80–5.20)
WBC: 4.9 x10 3/mm (ref 3.6–11.0)

## 2013-07-12 ENCOUNTER — Ambulatory Visit: Payer: Self-pay | Admitting: Internal Medicine

## 2013-07-12 DIAGNOSIS — I714 Abdominal aortic aneurysm, without rupture, unspecified: Secondary | ICD-10-CM | POA: Diagnosis not present

## 2013-07-12 DIAGNOSIS — D751 Secondary polycythemia: Secondary | ICD-10-CM | POA: Diagnosis not present

## 2013-07-12 DIAGNOSIS — G4730 Sleep apnea, unspecified: Secondary | ICD-10-CM | POA: Diagnosis not present

## 2013-07-12 DIAGNOSIS — J449 Chronic obstructive pulmonary disease, unspecified: Secondary | ICD-10-CM | POA: Diagnosis not present

## 2013-07-12 DIAGNOSIS — Z79899 Other long term (current) drug therapy: Secondary | ICD-10-CM | POA: Diagnosis not present

## 2013-07-12 DIAGNOSIS — Z7982 Long term (current) use of aspirin: Secondary | ICD-10-CM | POA: Diagnosis not present

## 2013-07-12 DIAGNOSIS — E039 Hypothyroidism, unspecified: Secondary | ICD-10-CM | POA: Diagnosis not present

## 2013-07-12 DIAGNOSIS — M810 Age-related osteoporosis without current pathological fracture: Secondary | ICD-10-CM | POA: Diagnosis not present

## 2013-07-12 DIAGNOSIS — I10 Essential (primary) hypertension: Secondary | ICD-10-CM | POA: Diagnosis not present

## 2013-07-12 DIAGNOSIS — E785 Hyperlipidemia, unspecified: Secondary | ICD-10-CM | POA: Diagnosis not present

## 2013-08-08 DIAGNOSIS — G4730 Sleep apnea, unspecified: Secondary | ICD-10-CM | POA: Diagnosis not present

## 2013-08-08 DIAGNOSIS — D751 Secondary polycythemia: Secondary | ICD-10-CM | POA: Diagnosis not present

## 2013-08-08 LAB — CBC CANCER CENTER
BASOS ABS: 0.1 x10 3/mm (ref 0.0–0.1)
Basophil %: 1 %
EOS PCT: 2.5 %
Eosinophil #: 0.1 x10 3/mm (ref 0.0–0.7)
HCT: 46.3 % (ref 35.0–47.0)
HGB: 15.1 g/dL (ref 12.0–16.0)
Lymphocyte #: 2.5 x10 3/mm (ref 1.0–3.6)
Lymphocyte %: 44.8 %
MCH: 30 pg (ref 26.0–34.0)
MCHC: 32.5 g/dL (ref 32.0–36.0)
MCV: 92 fL (ref 80–100)
MONO ABS: 0.6 x10 3/mm (ref 0.2–0.9)
Monocyte %: 10 %
Neutrophil #: 2.3 x10 3/mm (ref 1.4–6.5)
Neutrophil %: 41.7 %
PLATELETS: 227 x10 3/mm (ref 150–440)
RBC: 5.02 10*6/uL (ref 3.80–5.20)
RDW: 14.1 % (ref 11.5–14.5)
WBC: 5.5 x10 3/mm (ref 3.6–11.0)

## 2013-08-12 ENCOUNTER — Ambulatory Visit: Payer: Self-pay

## 2013-08-15 ENCOUNTER — Ambulatory Visit: Payer: Self-pay

## 2013-08-15 DIAGNOSIS — Z1231 Encounter for screening mammogram for malignant neoplasm of breast: Secondary | ICD-10-CM | POA: Diagnosis not present

## 2013-08-16 ENCOUNTER — Ambulatory Visit: Payer: Self-pay | Admitting: Internal Medicine

## 2013-09-12 DIAGNOSIS — D235 Other benign neoplasm of skin of trunk: Secondary | ICD-10-CM | POA: Diagnosis not present

## 2013-09-12 DIAGNOSIS — D1801 Hemangioma of skin and subcutaneous tissue: Secondary | ICD-10-CM | POA: Diagnosis not present

## 2013-09-12 DIAGNOSIS — Z85820 Personal history of malignant melanoma of skin: Secondary | ICD-10-CM | POA: Diagnosis not present

## 2013-09-20 DIAGNOSIS — E039 Hypothyroidism, unspecified: Secondary | ICD-10-CM | POA: Diagnosis not present

## 2013-09-20 DIAGNOSIS — I10 Essential (primary) hypertension: Secondary | ICD-10-CM | POA: Diagnosis not present

## 2013-09-20 DIAGNOSIS — E038 Other specified hypothyroidism: Secondary | ICD-10-CM | POA: Diagnosis not present

## 2013-09-20 DIAGNOSIS — D518 Other vitamin B12 deficiency anemias: Secondary | ICD-10-CM | POA: Diagnosis not present

## 2013-09-20 DIAGNOSIS — K219 Gastro-esophageal reflux disease without esophagitis: Secondary | ICD-10-CM | POA: Diagnosis not present

## 2013-09-20 DIAGNOSIS — D509 Iron deficiency anemia, unspecified: Secondary | ICD-10-CM | POA: Diagnosis not present

## 2013-09-20 DIAGNOSIS — I729 Aneurysm of unspecified site: Secondary | ICD-10-CM | POA: Diagnosis not present

## 2013-09-24 DIAGNOSIS — J45909 Unspecified asthma, uncomplicated: Secondary | ICD-10-CM | POA: Diagnosis not present

## 2013-10-25 DIAGNOSIS — I719 Aortic aneurysm of unspecified site, without rupture: Secondary | ICD-10-CM | POA: Diagnosis not present

## 2013-11-09 DIAGNOSIS — D45 Polycythemia vera: Secondary | ICD-10-CM | POA: Diagnosis not present

## 2013-12-25 DIAGNOSIS — I10 Essential (primary) hypertension: Secondary | ICD-10-CM | POA: Diagnosis not present

## 2013-12-25 DIAGNOSIS — E038 Other specified hypothyroidism: Secondary | ICD-10-CM | POA: Diagnosis not present

## 2013-12-25 DIAGNOSIS — E039 Hypothyroidism, unspecified: Secondary | ICD-10-CM | POA: Diagnosis not present

## 2013-12-25 DIAGNOSIS — K219 Gastro-esophageal reflux disease without esophagitis: Secondary | ICD-10-CM | POA: Diagnosis not present

## 2013-12-25 DIAGNOSIS — R5381 Other malaise: Secondary | ICD-10-CM | POA: Diagnosis not present

## 2013-12-25 DIAGNOSIS — D509 Iron deficiency anemia, unspecified: Secondary | ICD-10-CM | POA: Diagnosis not present

## 2013-12-25 DIAGNOSIS — R5383 Other fatigue: Secondary | ICD-10-CM | POA: Diagnosis not present

## 2013-12-25 DIAGNOSIS — M810 Age-related osteoporosis without current pathological fracture: Secondary | ICD-10-CM | POA: Diagnosis not present

## 2013-12-25 DIAGNOSIS — D45 Polycythemia vera: Secondary | ICD-10-CM | POA: Diagnosis not present

## 2013-12-25 DIAGNOSIS — E559 Vitamin D deficiency, unspecified: Secondary | ICD-10-CM | POA: Diagnosis not present

## 2013-12-27 DIAGNOSIS — H01009 Unspecified blepharitis unspecified eye, unspecified eyelid: Secondary | ICD-10-CM | POA: Diagnosis not present

## 2013-12-27 DIAGNOSIS — H52229 Regular astigmatism, unspecified eye: Secondary | ICD-10-CM | POA: Diagnosis not present

## 2013-12-27 DIAGNOSIS — H2510 Age-related nuclear cataract, unspecified eye: Secondary | ICD-10-CM | POA: Diagnosis not present

## 2013-12-27 DIAGNOSIS — H5200 Hypermetropia, unspecified eye: Secondary | ICD-10-CM | POA: Diagnosis not present

## 2014-01-16 ENCOUNTER — Ambulatory Visit: Payer: Self-pay

## 2014-01-16 DIAGNOSIS — Z85820 Personal history of malignant melanoma of skin: Secondary | ICD-10-CM | POA: Diagnosis not present

## 2014-01-16 DIAGNOSIS — L723 Sebaceous cyst: Secondary | ICD-10-CM | POA: Diagnosis not present

## 2014-01-16 DIAGNOSIS — E289 Ovarian dysfunction, unspecified: Secondary | ICD-10-CM | POA: Diagnosis not present

## 2014-01-16 DIAGNOSIS — D1801 Hemangioma of skin and subcutaneous tissue: Secondary | ICD-10-CM | POA: Diagnosis not present

## 2014-01-16 DIAGNOSIS — D485 Neoplasm of uncertain behavior of skin: Secondary | ICD-10-CM | POA: Diagnosis not present

## 2014-01-16 DIAGNOSIS — M810 Age-related osteoporosis without current pathological fracture: Secondary | ICD-10-CM | POA: Diagnosis not present

## 2014-01-16 DIAGNOSIS — L570 Actinic keratosis: Secondary | ICD-10-CM | POA: Diagnosis not present

## 2014-02-19 DIAGNOSIS — M810 Age-related osteoporosis without current pathological fracture: Secondary | ICD-10-CM | POA: Insufficient documentation

## 2014-03-06 DIAGNOSIS — D45 Polycythemia vera: Secondary | ICD-10-CM | POA: Diagnosis not present

## 2014-03-11 DIAGNOSIS — L570 Actinic keratosis: Secondary | ICD-10-CM | POA: Diagnosis not present

## 2014-03-21 ENCOUNTER — Ambulatory Visit: Payer: Self-pay | Admitting: Internal Medicine

## 2014-03-21 DIAGNOSIS — Z79899 Other long term (current) drug therapy: Secondary | ICD-10-CM | POA: Diagnosis not present

## 2014-03-21 DIAGNOSIS — Z7982 Long term (current) use of aspirin: Secondary | ICD-10-CM | POA: Diagnosis not present

## 2014-03-21 DIAGNOSIS — E785 Hyperlipidemia, unspecified: Secondary | ICD-10-CM | POA: Diagnosis not present

## 2014-03-21 DIAGNOSIS — J449 Chronic obstructive pulmonary disease, unspecified: Secondary | ICD-10-CM | POA: Diagnosis not present

## 2014-03-21 DIAGNOSIS — I739 Peripheral vascular disease, unspecified: Secondary | ICD-10-CM | POA: Diagnosis not present

## 2014-03-21 DIAGNOSIS — I10 Essential (primary) hypertension: Secondary | ICD-10-CM | POA: Diagnosis not present

## 2014-03-21 DIAGNOSIS — I714 Abdominal aortic aneurysm, without rupture, unspecified: Secondary | ICD-10-CM | POA: Diagnosis not present

## 2014-03-21 DIAGNOSIS — D45 Polycythemia vera: Secondary | ICD-10-CM | POA: Diagnosis not present

## 2014-03-21 DIAGNOSIS — E039 Hypothyroidism, unspecified: Secondary | ICD-10-CM | POA: Diagnosis not present

## 2014-03-21 LAB — CBC CANCER CENTER
BASOS ABS: 0 x10 3/mm (ref 0.0–0.1)
Basophil %: 0.7 %
EOS PCT: 1.7 %
Eosinophil #: 0.1 x10 3/mm (ref 0.0–0.7)
HCT: 47.1 % — AB (ref 35.0–47.0)
HGB: 15.4 g/dL (ref 12.0–16.0)
LYMPHS ABS: 2.5 x10 3/mm (ref 1.0–3.6)
Lymphocyte %: 36.7 %
MCH: 30.8 pg (ref 26.0–34.0)
MCHC: 32.6 g/dL (ref 32.0–36.0)
MCV: 94 fL (ref 80–100)
MONO ABS: 0.7 x10 3/mm (ref 0.2–0.9)
MONOS PCT: 10.9 %
NEUTROS PCT: 50 %
Neutrophil #: 3.4 x10 3/mm (ref 1.4–6.5)
Platelet: 209 x10 3/mm (ref 150–440)
RBC: 4.99 10*6/uL (ref 3.80–5.20)
RDW: 14.4 % (ref 11.5–14.5)
WBC: 6.8 x10 3/mm (ref 3.6–11.0)

## 2014-03-27 DIAGNOSIS — D509 Iron deficiency anemia, unspecified: Secondary | ICD-10-CM | POA: Diagnosis not present

## 2014-03-27 DIAGNOSIS — Z0000 Encounter for general adult medical examination without abnormal findings: Secondary | ICD-10-CM | POA: Diagnosis not present

## 2014-03-27 DIAGNOSIS — D751 Secondary polycythemia: Secondary | ICD-10-CM | POA: Diagnosis not present

## 2014-03-27 DIAGNOSIS — R300 Dysuria: Secondary | ICD-10-CM | POA: Diagnosis not present

## 2014-04-08 DIAGNOSIS — L570 Actinic keratosis: Secondary | ICD-10-CM | POA: Diagnosis not present

## 2014-04-11 ENCOUNTER — Ambulatory Visit: Payer: Self-pay | Admitting: Internal Medicine

## 2014-04-12 DIAGNOSIS — Z23 Encounter for immunization: Secondary | ICD-10-CM | POA: Diagnosis not present

## 2014-04-30 DIAGNOSIS — Z23 Encounter for immunization: Secondary | ICD-10-CM | POA: Diagnosis not present

## 2014-05-15 DIAGNOSIS — D751 Secondary polycythemia: Secondary | ICD-10-CM | POA: Diagnosis not present

## 2014-05-15 DIAGNOSIS — E038 Other specified hypothyroidism: Secondary | ICD-10-CM | POA: Diagnosis not present

## 2014-05-31 DIAGNOSIS — L821 Other seborrheic keratosis: Secondary | ICD-10-CM | POA: Diagnosis not present

## 2014-05-31 DIAGNOSIS — Z85820 Personal history of malignant melanoma of skin: Secondary | ICD-10-CM | POA: Diagnosis not present

## 2014-05-31 DIAGNOSIS — L570 Actinic keratosis: Secondary | ICD-10-CM | POA: Diagnosis not present

## 2014-05-31 DIAGNOSIS — D1801 Hemangioma of skin and subcutaneous tissue: Secondary | ICD-10-CM | POA: Diagnosis not present

## 2014-08-07 DIAGNOSIS — D751 Secondary polycythemia: Secondary | ICD-10-CM | POA: Diagnosis not present

## 2014-08-08 DIAGNOSIS — H5203 Hypermetropia, bilateral: Secondary | ICD-10-CM | POA: Diagnosis not present

## 2014-08-08 DIAGNOSIS — H524 Presbyopia: Secondary | ICD-10-CM | POA: Diagnosis not present

## 2014-08-08 DIAGNOSIS — H52223 Regular astigmatism, bilateral: Secondary | ICD-10-CM | POA: Diagnosis not present

## 2014-08-08 DIAGNOSIS — H2513 Age-related nuclear cataract, bilateral: Secondary | ICD-10-CM | POA: Diagnosis not present

## 2014-08-16 ENCOUNTER — Ambulatory Visit: Payer: Self-pay | Admitting: Internal Medicine

## 2014-08-16 DIAGNOSIS — Z1231 Encounter for screening mammogram for malignant neoplasm of breast: Secondary | ICD-10-CM | POA: Diagnosis not present

## 2014-09-03 DIAGNOSIS — R319 Hematuria, unspecified: Secondary | ICD-10-CM | POA: Diagnosis not present

## 2014-09-03 DIAGNOSIS — E2839 Other primary ovarian failure: Secondary | ICD-10-CM | POA: Diagnosis not present

## 2014-09-03 DIAGNOSIS — N390 Urinary tract infection, site not specified: Secondary | ICD-10-CM | POA: Diagnosis not present

## 2014-09-05 DIAGNOSIS — R319 Hematuria, unspecified: Secondary | ICD-10-CM | POA: Diagnosis not present

## 2014-10-03 DIAGNOSIS — Z85820 Personal history of malignant melanoma of skin: Secondary | ICD-10-CM | POA: Diagnosis not present

## 2014-10-03 DIAGNOSIS — D2272 Melanocytic nevi of left lower limb, including hip: Secondary | ICD-10-CM | POA: Diagnosis not present

## 2014-10-03 DIAGNOSIS — D2261 Melanocytic nevi of right upper limb, including shoulder: Secondary | ICD-10-CM | POA: Diagnosis not present

## 2014-10-03 DIAGNOSIS — D225 Melanocytic nevi of trunk: Secondary | ICD-10-CM | POA: Diagnosis not present

## 2014-10-09 DIAGNOSIS — D45 Polycythemia vera: Secondary | ICD-10-CM | POA: Diagnosis not present

## 2014-10-22 DIAGNOSIS — I10 Essential (primary) hypertension: Secondary | ICD-10-CM | POA: Diagnosis not present

## 2014-10-22 DIAGNOSIS — R319 Hematuria, unspecified: Secondary | ICD-10-CM | POA: Diagnosis not present

## 2014-10-22 DIAGNOSIS — N390 Urinary tract infection, site not specified: Secondary | ICD-10-CM | POA: Diagnosis not present

## 2014-10-22 DIAGNOSIS — E2839 Other primary ovarian failure: Secondary | ICD-10-CM | POA: Diagnosis not present

## 2014-11-03 NOTE — Op Note (Signed)
PATIENT NAME:  Sabrina Zamora, Sabrina Zamora MR#:  [id number] DATE OF BIRTH:  03/19/1931  DATE OF PROCEDURE:  07/16/2011  PREOPERATIVE DIAGNOSIS: Endometrial hyperplasia.   POSTOPERATIVE DIAGNOSIS: Large fleshy endometrial polyp.   OPERATION PERFORMED:  1. Dilation and curettage.  2. Hysteroscopy. 3. Removal of polyp.   SURGEON: Wonda Cheng. Laurey Morale, MD   OPERATIVE FINDINGS: The patient had a large fleshy endometrial polyp.   OPERATION: After adequate general anesthesia, the patient was prepped and draped in routine fashion. Cervix was dilated with ease. Uterine cavity was systematically visualized with demonstration of a large endometrial polyp. The hysteroscope was removed and polyp forceps were used to remove this large polyp. Uterine curettage was performed with return of no tissue. The patient tolerated the procedure well and left the operating room in good condition. Sponge and needle counts were said to be correct at the end of the procedure.   ____________________________ Wonda Cheng. Laurey Morale, MD pjr:drc D: 07/16/2011 14:10:54 ET T: 07/16/2011 15:18:45 ET JOB#: [id number]  cc: Wonda Cheng. Laurey Morale, MD, <Dictator> Rosina Lowenstein MD ELECTRONICALLY SIGNED 07/17/2011 7:35

## 2014-11-07 DIAGNOSIS — J4530 Mild persistent asthma, uncomplicated: Secondary | ICD-10-CM | POA: Diagnosis not present

## 2015-01-31 DIAGNOSIS — D2261 Melanocytic nevi of right upper limb, including shoulder: Secondary | ICD-10-CM | POA: Diagnosis not present

## 2015-01-31 DIAGNOSIS — X32XXXA Exposure to sunlight, initial encounter: Secondary | ICD-10-CM | POA: Diagnosis not present

## 2015-01-31 DIAGNOSIS — C4359 Malignant melanoma of other part of trunk: Secondary | ICD-10-CM | POA: Diagnosis not present

## 2015-01-31 DIAGNOSIS — L570 Actinic keratosis: Secondary | ICD-10-CM | POA: Diagnosis not present

## 2015-01-31 DIAGNOSIS — Z85820 Personal history of malignant melanoma of skin: Secondary | ICD-10-CM | POA: Diagnosis not present

## 2015-01-31 DIAGNOSIS — D485 Neoplasm of uncertain behavior of skin: Secondary | ICD-10-CM | POA: Diagnosis not present

## 2015-01-31 DIAGNOSIS — D2262 Melanocytic nevi of left upper limb, including shoulder: Secondary | ICD-10-CM | POA: Diagnosis not present

## 2015-02-04 DIAGNOSIS — K1121 Acute sialoadenitis: Secondary | ICD-10-CM | POA: Diagnosis not present

## 2015-02-05 DIAGNOSIS — Z880 Allergy status to penicillin: Secondary | ICD-10-CM | POA: Diagnosis not present

## 2015-02-05 DIAGNOSIS — C4359 Malignant melanoma of other part of trunk: Secondary | ICD-10-CM | POA: Insufficient documentation

## 2015-02-05 DIAGNOSIS — D225 Melanocytic nevi of trunk: Secondary | ICD-10-CM | POA: Diagnosis not present

## 2015-02-05 DIAGNOSIS — Z881 Allergy status to other antibiotic agents status: Secondary | ICD-10-CM | POA: Diagnosis not present

## 2015-02-05 DIAGNOSIS — Z85820 Personal history of malignant melanoma of skin: Secondary | ICD-10-CM | POA: Diagnosis not present

## 2015-02-05 DIAGNOSIS — Z9049 Acquired absence of other specified parts of digestive tract: Secondary | ICD-10-CM | POA: Diagnosis not present

## 2015-02-05 DIAGNOSIS — Z882 Allergy status to sulfonamides status: Secondary | ICD-10-CM | POA: Diagnosis not present

## 2015-02-05 DIAGNOSIS — C439 Malignant melanoma of skin, unspecified: Secondary | ICD-10-CM | POA: Diagnosis not present

## 2015-02-20 DIAGNOSIS — E785 Hyperlipidemia, unspecified: Secondary | ICD-10-CM | POA: Diagnosis not present

## 2015-02-20 DIAGNOSIS — J45909 Unspecified asthma, uncomplicated: Secondary | ICD-10-CM | POA: Diagnosis not present

## 2015-02-20 DIAGNOSIS — C4359 Malignant melanoma of other part of trunk: Secondary | ICD-10-CM | POA: Diagnosis not present

## 2015-02-20 DIAGNOSIS — E039 Hypothyroidism, unspecified: Secondary | ICD-10-CM | POA: Diagnosis not present

## 2015-02-20 DIAGNOSIS — I10 Essential (primary) hypertension: Secondary | ICD-10-CM | POA: Diagnosis not present

## 2015-02-20 DIAGNOSIS — C439 Malignant melanoma of skin, unspecified: Secondary | ICD-10-CM | POA: Diagnosis not present

## 2015-03-07 DIAGNOSIS — Z09 Encounter for follow-up examination after completed treatment for conditions other than malignant neoplasm: Secondary | ICD-10-CM | POA: Diagnosis not present

## 2015-03-07 DIAGNOSIS — C4359 Malignant melanoma of other part of trunk: Secondary | ICD-10-CM | POA: Diagnosis not present

## 2015-03-14 DIAGNOSIS — H52223 Regular astigmatism, bilateral: Secondary | ICD-10-CM | POA: Diagnosis not present

## 2015-03-14 DIAGNOSIS — H5203 Hypermetropia, bilateral: Secondary | ICD-10-CM | POA: Diagnosis not present

## 2015-03-14 DIAGNOSIS — H2513 Age-related nuclear cataract, bilateral: Secondary | ICD-10-CM | POA: Diagnosis not present

## 2015-04-17 DIAGNOSIS — D751 Secondary polycythemia: Secondary | ICD-10-CM | POA: Diagnosis not present

## 2015-04-17 DIAGNOSIS — J4540 Moderate persistent asthma, uncomplicated: Secondary | ICD-10-CM | POA: Diagnosis not present

## 2015-04-17 DIAGNOSIS — E782 Mixed hyperlipidemia: Secondary | ICD-10-CM | POA: Diagnosis not present

## 2015-04-17 DIAGNOSIS — Z0001 Encounter for general adult medical examination with abnormal findings: Secondary | ICD-10-CM | POA: Diagnosis not present

## 2015-04-17 DIAGNOSIS — R0602 Shortness of breath: Secondary | ICD-10-CM | POA: Diagnosis not present

## 2015-04-17 DIAGNOSIS — D509 Iron deficiency anemia, unspecified: Secondary | ICD-10-CM | POA: Diagnosis not present

## 2015-04-17 DIAGNOSIS — J3481 Nasal mucositis (ulcerative): Secondary | ICD-10-CM | POA: Diagnosis not present

## 2015-04-17 DIAGNOSIS — Z23 Encounter for immunization: Secondary | ICD-10-CM | POA: Diagnosis not present

## 2015-04-17 DIAGNOSIS — I10 Essential (primary) hypertension: Secondary | ICD-10-CM | POA: Diagnosis not present

## 2015-04-17 DIAGNOSIS — E538 Deficiency of other specified B group vitamins: Secondary | ICD-10-CM | POA: Diagnosis not present

## 2015-04-17 DIAGNOSIS — T8069XS Other serum reaction due to other serum, sequela: Secondary | ICD-10-CM | POA: Diagnosis not present

## 2015-04-17 DIAGNOSIS — E559 Vitamin D deficiency, unspecified: Secondary | ICD-10-CM | POA: Diagnosis not present

## 2015-04-22 DIAGNOSIS — E875 Hyperkalemia: Secondary | ICD-10-CM | POA: Diagnosis not present

## 2015-04-22 DIAGNOSIS — D751 Secondary polycythemia: Secondary | ICD-10-CM | POA: Diagnosis not present

## 2015-04-22 DIAGNOSIS — M545 Low back pain: Secondary | ICD-10-CM | POA: Diagnosis not present

## 2015-04-22 DIAGNOSIS — I10 Essential (primary) hypertension: Secondary | ICD-10-CM | POA: Diagnosis not present

## 2015-05-06 ENCOUNTER — Inpatient Hospital Stay: Payer: Medicare Other | Attending: Hematology and Oncology | Admitting: Hematology and Oncology

## 2015-05-06 ENCOUNTER — Encounter: Payer: Self-pay | Admitting: Hematology and Oncology

## 2015-05-06 ENCOUNTER — Inpatient Hospital Stay: Payer: Medicare Other

## 2015-05-06 ENCOUNTER — Other Ambulatory Visit: Payer: Self-pay | Admitting: Hematology and Oncology

## 2015-05-06 VITALS — BP 130/85 | HR 76 | Temp 95.6°F | Resp 18 | Ht 62.0 in | Wt 94.8 lb

## 2015-05-06 DIAGNOSIS — Z79899 Other long term (current) drug therapy: Secondary | ICD-10-CM | POA: Diagnosis not present

## 2015-05-06 DIAGNOSIS — Z85820 Personal history of malignant melanoma of skin: Secondary | ICD-10-CM

## 2015-05-06 DIAGNOSIS — D751 Secondary polycythemia: Secondary | ICD-10-CM | POA: Insufficient documentation

## 2015-05-06 DIAGNOSIS — G4730 Sleep apnea, unspecified: Secondary | ICD-10-CM | POA: Diagnosis not present

## 2015-05-06 DIAGNOSIS — Z7982 Long term (current) use of aspirin: Secondary | ICD-10-CM | POA: Insufficient documentation

## 2015-05-06 LAB — CBC WITH DIFFERENTIAL/PLATELET
Basophils Absolute: 0.1 10*3/uL (ref 0–0.1)
Basophils Relative: 1 %
Eosinophils Absolute: 0.1 10*3/uL (ref 0–0.7)
Eosinophils Relative: 2 %
HCT: 51.2 % — ABNORMAL HIGH (ref 35.0–47.0)
Hemoglobin: 16.8 g/dL — ABNORMAL HIGH (ref 12.0–16.0)
Lymphocytes Relative: 34 %
Lymphs Abs: 2.2 10*3/uL (ref 1.0–3.6)
MCH: 30.5 pg (ref 26.0–34.0)
MCHC: 32.9 g/dL (ref 32.0–36.0)
MCV: 92.8 fL (ref 80.0–100.0)
Monocytes Absolute: 0.7 10*3/uL (ref 0.2–0.9)
Monocytes Relative: 10 %
Neutro Abs: 3.5 10*3/uL (ref 1.4–6.5)
Neutrophils Relative %: 53 %
Platelets: 248 10*3/uL (ref 150–440)
RBC: 5.52 MIL/uL — ABNORMAL HIGH (ref 3.80–5.20)
RDW: 14.3 % (ref 11.5–14.5)
WBC: 6.5 10*3/uL (ref 3.6–11.0)

## 2015-05-06 LAB — IRON AND TIBC
Iron: 135 ug/dL (ref 28–170)
Saturation Ratios: 32 % — ABNORMAL HIGH (ref 10.4–31.8)
TIBC: 423 ug/dL (ref 250–450)
UIBC: 288 ug/dL

## 2015-05-06 LAB — CARBOXYHEMOGLOBIN
Carboxyhemoglobin: 2.3 % (ref 1.5–9.0)
Total oxygen content: 37.8 mL/dL

## 2015-05-06 LAB — FERRITIN: Ferritin: 34 ng/mL (ref 11–307)

## 2015-05-06 NOTE — Progress Notes (Signed)
Patient is here for yearly follow-up of polycythemia. Patient states that she recently had a melanoma removed at [facility] in August. Patient has also been dx'ed with having high calcium and high potassium. She has stopped her calcium supplements and has been been trying to watch what she eats to avoid to much potassium and calcium. She states that this has affected her eating and she is not eating that much now.

## 2015-05-06 NOTE — Progress Notes (Signed)
[facility] day:  05/06/2015  Chief Complaint: Sabrina Zamora is a 81 y.o. female with a history of secondary polycythemia who is seen for reassessment.  HPI:  The patient was initially seen in the hematology clinic by Dr. Inez Pilgrim on 01/11/2012.  Per patient records, hematocrit was 50.3 on 12/2011.  She has known sleep apnea (wears oxygen at night but no CPAP mask) and asthma.  She has never smoked.  She is unaware of any cardiac issues.  Work-up revealed a negative JAK2 and a mildy elevated erythropoietin level (46.9).  Initial carboxyhemoglobin was high then later rechecked and was normal.  WBC and platelet count have been normal.  Abdominal ultrasound was normal.  She has undergone phlebotomy initially to keep hematocrit < 48 then later to keep < 50.  Per her records, she underwent phlebotomy of 200 cc on 01/11/2012 for a hematocrit of 48.1 then 300 cc on 03/30/2012 for a hematocrit of 51.2.  She last saw Dr. Inez Pilgrim on 03/21/2014.  CBC revealed a hematocrit of 47.1 and hemoglobin 15.4.  She did not undergo phlebotomy.  She notes a history of melanoma in 02/2011 of a T1a lesion.  She notes a history of high potassium and calcium.  She is off oral calcium.  She avoids foods high in potassium.  Recent labs on 04/17/2015 revealed a hematocrit of 51.5, hemoglobin 17.2, MCV 94, platelets 227,000, WBC 6500.  Calcium was 11.3.  Albumen was 4.8.  Potassium was 6.2.  Symptomatically, she feels good.  She notes a poor appetite.  She wants food rich in potassium and calcium.  Her weight has been stable (95-96 pounds) over the past year.  She is active and walks [place] the malls.  Past Medical History  Diagnosis Date  . Osteoporosis   . Skin cancer 2012, 2016    Past Surgical History  Procedure Laterality Date  . Cholecystectomy      Family History  Problem Relation Age of Onset  . Clotting disorder Sister     Social History:  has no tobacco, alcohol,  and drug history on file.  She has never smoked.  The patient is alone today.  Allergies:  Allergies  Allergen Reactions  . Levofloxacin Rash and Shortness Of Breath  . Penicillins Hives and Rash  . Betaine Rash  . Ciprofloxacin Itching and Rash  . Sulfa Antibiotics Rash    Current Medications: Current Outpatient Prescriptions  Medication Sig Dispense Refill  . aspirin EC 81 MG tablet Take 81 mg by mouth.    . docusate sodium (COLACE) 100 MG capsule Take 100 mg by mouth.    . Fluticasone-Salmeterol (ADVAIR) 250-50 MCG/DOSE AEPB Inhale into the lungs.    Marland Kitchen levothyroxine (SYNTHROID, LEVOTHROID) 25 MCG tablet Take by mouth.    Marland Kitchen lisinopril (PRINIVIL,ZESTRIL) 10 MG tablet Take 10 mg by mouth.    . lovastatin (MEVACOR) 20 MG tablet Take 20 mg by mouth.    . Multiple Vitamin (MULTI-VITAMINS) TABS Take by mouth.    Marland Kitchen omeprazole (PRILOSEC) 20 MG capsule Take 20 mg by mouth.    . oxyCODONE (OXY IR/ROXICODONE) 5 MG immediate release tablet Take 5-10 mg by mouth.     No current facility-administered medications for this visit.    Review of Systems:  GENERAL:  Feels good.  Active.  No fevers, sweats or weight loss.  Weight stable 95-96 pounds in past year. PERFORMANCE STATUS (ECOG):  1 HEENT:  No visual changes, runny nose,  sore throat, mouth sores or tenderness. Lungs: No shortness of breath or cough.  No hemoptysis. Cardiac:  No chest pain, palpitations, orthopnea, or PND. GI:  Poor appetite.  No nausea, vomiting, diarrhea, constipation, melena or hematochezia. GU:  No urgency, frequency, dysuria, or hematuria. Musculoskeletal:  No back pain.  No joint pain.  No muscle tenderness. Extremities:  No pain or swelling. Skin:  No rashes or skin changes. Neuro:  No headache, numbness or weakness, balance or coordination issues. Endocrine:  Thyroid disease on Synthroid.  No diabetes, hot flashes or night sweats. Psych:  No mood changes, depression or anxiety. Pain:  No focal pain. Review of  systems:  All other systems reviewed and found to be negative.  Physical Exam: Blood pressure 130/85, pulse 76, temperature 95.6 F (35.3 C), temperature source Tympanic, resp. rate 18, height '5\' 2"'$  (1.575 m), weight 94 lb 12.8 oz (43 kg). GENERAL:  Thin woman sitting comfortably in the exam room in no acute distress. MENTAL STATUS:  Alert and oriented to person, place and time. HEAD:  Styled brown/dark blonde hair.  Normocephalic, atraumatic, face symmetric, no Cushingoid features. EYES:  Blue eyes.  Pupils equal round and reactive to light and accomodation.  No conjunctivitis or scleral icterus. ENT:  Oropharynx clear without lesion.  Torus.  Tongue normal. Mucous membranes moist.  RESPIRATORY:  Clear to auscultation without rales, wheezes or rhonchi. CARDIOVASCULAR:  Regular rate and rhythm without murmur, rub or gallop. ABDOMEN:  Soft, non-tender, with active bowel sounds, and no hepatosplenomegaly.  No masses. SKIN:  Left upper back large well healed incision.  Left arm incision.  Left medial knee incision.  No rashes, ulcers or lesions. EXTREMITIES: No edema, no skin discoloration or tenderness.  No palpable cords. LYMPH NODES: No palpable cervical, supraclavicular, axillary or inguinal adenopathy  NEUROLOGICAL: Unremarkable. PSYCH:  Appropriate.  No visits with results within 3 Day(s) from this visit. Latest known visit with results is:  [facility] Conversion on 03/21/2014  Component Date Value Ref Range Status  . WBC 03/21/2014 6.8  3.6-11.0 x10 3/mm  Final  . RBC 03/21/2014 4.99  3.80-5.20 x10 6/mm  Final  . HGB 03/21/2014 15.4  12.0-16.0 g/dL Final  . HCT 03/21/2014 47.1* 35.0-47.0 % Final  . MCV 03/21/2014 94  80-100 fL Final  . MCH 03/21/2014 30.8  26.0-34.0 pg Final  . MCHC 03/21/2014 32.6  32.0-36.0 g/dL Final  . RDW 03/21/2014 14.4  11.5-14.5 % Final  . Platelet 03/21/2014 209  150-440 x10 3/mm  Final  . Neutrophil % 03/21/2014 50.0   Final  . Lymphocyte % 03/21/2014 36.7    Final  . Monocyte % 03/21/2014 10.9   Final  . Eosinophil % 03/21/2014 1.7   Final  . Basophil % 03/21/2014 0.7   Final  . Neutrophil # 03/21/2014 3.4  1.4-6.5 x10 3/mm  Final  . Lymphocyte # 03/21/2014 2.5  1.0-3.6 x10 3/mm  Final  . Monocyte # 03/21/2014 0.7  0.2-0.9 x10 3/mm  Final  . Eosinophil # 03/21/2014 0.1  0.0-0.7 x10 3/mm  Final  . Basophil # 03/21/2014 0.0  0.0-0.1 x10 3/mm  Final    Assessment:  Sabrina Zamora is a 81 y.o. female with secondary polycythemia first noted in 2013.  She has known sleep apnea (wears oxygen at night but no CPAP mask) and asthma.  She has never smoked.  She is unaware of any cardiac issues.  Work-up revealed a negative JAK2 and a mildy elevated erythropoietin level (46.9).  Initial carboxyhemoglobin was high then later rechecked and was normal.  WBC and platelet count have been normal.  Abdominal ultrasound was normal.  She has undergone phlebotomy x 2  to keep hematocrit < 48 then later to keep < 50.  Per her records, she underwent phlebotomy of 200 cc on 01/11/2012 for a hematocrit of 48.1 then 300 cc on 03/30/2012 for a hematocrit of 51.2.    She has a history of T1a melanoma in 02/2011.  She has a history of high potassium and calcium.  She is off oral calcium.  She avoids foods high in potassium.  Symptomatically, she feels good.  She is active.  Exam is unremarkable.  Plan: 1. Review entire medical history, diagnosis of secondary polycythemia ad treatment to date.  Discuss phlebotomy for hematocrit > 50.  Discuss recent counts. 2. Labs today:  CBC with diff, carboxyhemoglobin, erythropoietin level, ferritin, iron studies. 3. Phlebotomy tomorrow if hematocrit high. 4. Confirm JAK2 testing. 5. RTC in 3 months for MD assessment and labs (CBC with diff +/- others).  Addendum:  Hematocrit 51.2 with a hemoglobin 16.8.  Phlebotomy 200 cc tomorrow.   Lequita Asal, MD  05/06/2015, 11:12 AM

## 2015-05-07 ENCOUNTER — Inpatient Hospital Stay: Payer: Medicare Other

## 2015-05-07 DIAGNOSIS — Z85820 Personal history of malignant melanoma of skin: Secondary | ICD-10-CM | POA: Diagnosis not present

## 2015-05-07 DIAGNOSIS — G4730 Sleep apnea, unspecified: Secondary | ICD-10-CM | POA: Diagnosis not present

## 2015-05-07 DIAGNOSIS — D751 Secondary polycythemia: Secondary | ICD-10-CM | POA: Diagnosis not present

## 2015-05-07 DIAGNOSIS — Z79899 Other long term (current) drug therapy: Secondary | ICD-10-CM | POA: Diagnosis not present

## 2015-05-07 DIAGNOSIS — Z7982 Long term (current) use of aspirin: Secondary | ICD-10-CM | POA: Diagnosis not present

## 2015-05-07 LAB — ERYTHROPOIETIN: Erythropoietin: 4.4 m[IU]/mL (ref 2.6–18.5)

## 2015-05-08 DIAGNOSIS — L720 Epidermal cyst: Secondary | ICD-10-CM | POA: Diagnosis not present

## 2015-05-08 DIAGNOSIS — Z85820 Personal history of malignant melanoma of skin: Secondary | ICD-10-CM | POA: Diagnosis not present

## 2015-05-08 DIAGNOSIS — D1801 Hemangioma of skin and subcutaneous tissue: Secondary | ICD-10-CM | POA: Diagnosis not present

## 2015-05-08 DIAGNOSIS — Z872 Personal history of diseases of the skin and subcutaneous tissue: Secondary | ICD-10-CM | POA: Diagnosis not present

## 2015-05-08 DIAGNOSIS — D485 Neoplasm of uncertain behavior of skin: Secondary | ICD-10-CM | POA: Diagnosis not present

## 2015-05-08 DIAGNOSIS — L820 Inflamed seborrheic keratosis: Secondary | ICD-10-CM | POA: Diagnosis not present

## 2015-06-10 ENCOUNTER — Inpatient Hospital Stay: Payer: Medicare Other

## 2015-06-10 ENCOUNTER — Inpatient Hospital Stay: Payer: Medicare Other | Attending: Hematology and Oncology

## 2015-06-10 DIAGNOSIS — D751 Secondary polycythemia: Secondary | ICD-10-CM

## 2015-06-10 LAB — CBC
HCT: 45 % (ref 35.0–47.0)
Hemoglobin: 14.7 g/dL (ref 12.0–16.0)
MCH: 30.7 pg (ref 26.0–34.0)
MCHC: 32.7 g/dL (ref 32.0–36.0)
MCV: 93.6 fL (ref 80.0–100.0)
Platelets: 238 10*3/uL (ref 150–440)
RBC: 4.81 MIL/uL (ref 3.80–5.20)
RDW: 13.6 % (ref 11.5–14.5)
WBC: 5.7 10*3/uL (ref 3.6–11.0)

## 2015-06-10 NOTE — Progress Notes (Unsigned)
Spoke with Theadora Rama, RN, and she looked back at Dr. Kem Parkinson note and she stated phlebotomy only if hematocrit hight, hematocrit within normal range.  LJ

## 2015-06-12 DIAGNOSIS — E875 Hyperkalemia: Secondary | ICD-10-CM | POA: Diagnosis not present

## 2015-06-12 DIAGNOSIS — E782 Mixed hyperlipidemia: Secondary | ICD-10-CM | POA: Diagnosis not present

## 2015-06-12 DIAGNOSIS — E039 Hypothyroidism, unspecified: Secondary | ICD-10-CM | POA: Diagnosis not present

## 2015-06-12 DIAGNOSIS — I10 Essential (primary) hypertension: Secondary | ICD-10-CM | POA: Diagnosis not present

## 2015-06-12 DIAGNOSIS — D751 Secondary polycythemia: Secondary | ICD-10-CM | POA: Diagnosis not present

## 2015-07-18 DIAGNOSIS — L570 Actinic keratosis: Secondary | ICD-10-CM | POA: Diagnosis not present

## 2015-07-18 DIAGNOSIS — X32XXXA Exposure to sunlight, initial encounter: Secondary | ICD-10-CM | POA: Diagnosis not present

## 2015-07-18 DIAGNOSIS — L821 Other seborrheic keratosis: Secondary | ICD-10-CM | POA: Diagnosis not present

## 2015-07-31 DIAGNOSIS — E875 Hyperkalemia: Secondary | ICD-10-CM | POA: Diagnosis not present

## 2015-08-08 ENCOUNTER — Inpatient Hospital Stay: Payer: Medicare Other | Attending: Hematology and Oncology

## 2015-08-08 ENCOUNTER — Inpatient Hospital Stay (HOSPITAL_BASED_OUTPATIENT_CLINIC_OR_DEPARTMENT_OTHER): Payer: Medicare Other | Admitting: Hematology and Oncology

## 2015-08-08 ENCOUNTER — Other Ambulatory Visit: Payer: Self-pay

## 2015-08-08 ENCOUNTER — Encounter: Payer: Self-pay | Admitting: Hematology and Oncology

## 2015-08-08 VITALS — BP 143/83 | HR 76 | Temp 96.6°F | Resp 18 | Ht 62.0 in | Wt 92.5 lb

## 2015-08-08 DIAGNOSIS — G4730 Sleep apnea, unspecified: Secondary | ICD-10-CM | POA: Insufficient documentation

## 2015-08-08 DIAGNOSIS — D751 Secondary polycythemia: Secondary | ICD-10-CM

## 2015-08-08 DIAGNOSIS — Z85828 Personal history of other malignant neoplasm of skin: Secondary | ICD-10-CM | POA: Insufficient documentation

## 2015-08-08 DIAGNOSIS — Z79899 Other long term (current) drug therapy: Secondary | ICD-10-CM

## 2015-08-08 DIAGNOSIS — Z7982 Long term (current) use of aspirin: Secondary | ICD-10-CM | POA: Diagnosis not present

## 2015-08-08 LAB — CBC
HCT: 48.2 % — ABNORMAL HIGH (ref 35.0–47.0)
Hemoglobin: 16 g/dL (ref 12.0–16.0)
MCH: 30.9 pg (ref 26.0–34.0)
MCHC: 33.3 g/dL (ref 32.0–36.0)
MCV: 92.9 fL (ref 80.0–100.0)
Platelets: 179 10*3/uL (ref 150–440)
RBC: 5.18 MIL/uL (ref 3.80–5.20)
RDW: 13.2 % (ref 11.5–14.5)
WBC: 5.4 10*3/uL (ref 3.6–11.0)

## 2015-08-08 NOTE — Progress Notes (Signed)
[facility] day:  08/08/2015  Chief Complaint: Sabrina Zamora is a 82 y.o. female with a history of secondary polycythemia who is seen for 6 month assessment.  HPI:  The patient was last seen in the medical oncology clinic on 05/06/2015.  At that time, she was seen for initial assessment by me.  Symptomatically, she felt good.  She noted a poor appetite.  She wants food rich in potassium and calcium.  Her weight was stable (95-96 pounds) over the past year.  She was active and walks in the malls.    Labs at last visit included a CBC with a hematocrit of 51.2 with a hemoglobin 16.8.  Carboxyhemoglobin was 2.3% (normal).  Erythropoietin level was 4.4 (normal).  Ferritin was 34 and iron saturation of 32%.  She underwent a phlebotomy of 200 cc on 05/07/2015.  Symptomatically, she feels fine. She continues to eat a diet low in potassium and calcium.  She notes that her weight fluctuates.  Regarding her health maintenance, she underwent a colonoscopy 2-3 years ago by Dr. Vira Agar. One polyp was removed. Mammogram is due on 09/04/2015.   Past Medical History  Diagnosis Date  . Osteoporosis   . Skin cancer 2012, 2016    Past Surgical History  Procedure Laterality Date  . Cholecystectomy      Family History  Problem Relation Age of Onset  . Clotting disorder Sister     Social History:  reports that she has never smoked. She does not have any smokeless tobacco history on file. Her alcohol and drug histories are not on file.  She has never smoked.  The patient is alone today.  Allergies:  Allergies  Allergen Reactions  . Levofloxacin Rash and Shortness Of Breath  . Penicillins Hives and Rash  . Betaine Rash  . Ciprofloxacin Itching and Rash  . Sulfa Antibiotics Rash    Current Medications: Current Outpatient Prescriptions  Medication Sig Dispense Refill  . aspirin EC 81 MG tablet Take 81 mg by mouth.    . docusate sodium (COLACE) 100 MG  capsule Take 100 mg by mouth.    . Fluticasone-Salmeterol (ADVAIR) 250-50 MCG/DOSE AEPB Inhale into the lungs.    Marland Kitchen levothyroxine (SYNTHROID, LEVOTHROID) 25 MCG tablet Take by mouth.    Marland Kitchen lisinopril (PRINIVIL,ZESTRIL) 10 MG tablet Take 10 mg by mouth.    . lovastatin (MEVACOR) 20 MG tablet Take 20 mg by mouth.    . Multiple Vitamin (MULTI-VITAMINS) TABS Take by mouth.    Marland Kitchen omeprazole (PRILOSEC) 20 MG capsule Take 20 mg by mouth.    . oxyCODONE (OXY IR/ROXICODONE) 5 MG immediate release tablet Take 5-10 mg by mouth.     No current facility-administered medications for this visit.    Review of Systems:  GENERAL:  Feels good.  No fevers, sweats or weight loss.  Weight fluctuates, but overall stable. PERFORMANCE STATUS (ECOG):  1 HEENT:  No visual changes, runny nose, sore throat, mouth sores or tenderness. Lungs: No shortness of breath or cough.  No hemoptysis. Cardiac:  No chest pain, palpitations, orthopnea, or PND. GI:  Poor appetite secondary to food choices low in potassium and calcium.  No nausea, vomiting, diarrhea, constipation, melena or hematochezia. GU:  No urgency, frequency, dysuria, or hematuria. Musculoskeletal:  No back pain.  No joint pain.  No muscle tenderness. Extremities:  No pain or swelling. Skin:  No rashes or skin changes. Neuro:  No headache, numbness or weakness, balance  or coordination issues. Endocrine:  Thyroid disease on Synthroid.  No diabetes, hot flashes or night sweats. Psych:  No mood changes, depression or anxiety. Pain:  No focal pain. Review of systems:  All other systems reviewed and found to be negative.  Physical Exam: Blood pressure 143/83, pulse 76, temperature 96.6 F (35.9 C), temperature source Tympanic, resp. rate 18, height '5\' 2"'$  (1.575 m), weight 92 lb 7.7 oz (41.95 kg). GENERAL:  Thin woman sitting comfortably in the exam room in no acute distress. MENTAL STATUS:  Alert and oriented to person, place and time. HEAD:  Styled brown/dark  blonde hair.  Normocephalic, atraumatic, face symmetric, no Cushingoid features. EYES:  Blue eyes.  Pupils equal round and reactive to light and accomodation.  No conjunctivitis or scleral icterus. ENT:  Oropharynx clear without lesion.  Torus.  Tongue normal. Mucous membranes moist.  RESPIRATORY:  Clear to auscultation without rales, wheezes or rhonchi. CARDIOVASCULAR:  Regular rate and rhythm without murmur, rub or gallop. ABDOMEN:  Soft, non-tender, with active bowel sounds, and no hepatosplenomegaly.  No masses. SKIN:  No rashes, ulcers or lesions. EXTREMITIES: No edema, no skin discoloration or tenderness.  No palpable cords. LYMPH NODES: No palpable cervical, supraclavicular, axillary or inguinal adenopathy  NEUROLOGICAL: Unremarkable. PSYCH:  Appropriate.  Appointment on 08/08/2015  Component Date Value Ref Range Status  . WBC 08/08/2015 5.4  3.6 - 11.0 K/uL Final  . RBC 08/08/2015 5.18  3.80 - 5.20 MIL/uL Final  . Hemoglobin 08/08/2015 16.0  12.0 - 16.0 g/dL Final  . HCT 08/08/2015 48.2* 35.0 - 47.0 % Final  . MCV 08/08/2015 92.9  80.0 - 100.0 fL Final  . MCH 08/08/2015 30.9  26.0 - 34.0 pg Final  . MCHC 08/08/2015 33.3  32.0 - 36.0 g/dL Final  . RDW 08/08/2015 13.2  11.5 - 14.5 % Final  . Platelets 08/08/2015 179  150 - 440 K/uL Final    Assessment:  Sabrina Zamora is a 82 y.o. female with secondary polycythemia first noted in 2013.  She has known sleep apnea (wears oxygen at night but no CPAP mask) and asthma.  She has never smoked.  She is unaware of any cardiac issues.  Work-up on 01/11/2012 revealed a negative JAK2 V617F and exon 12 mutation.  She had a mildy elevated erythropoietin level (46.9) with repeat normal (4.4) on 05/06/2015.  Initial carboxyhemoglobin was 4.6% (high) then 2.3% (normal) on 05/06/2015.   WBC and platelet count have been normal.  Abdominal ultrasound was normal.  She has undergone phlebotomy x 3 to keep hematocrit < 48 then later to keep < 50.  She  underwent phlebotomy of 200 cc on 01/11/2012 for a hematocrit of 48.1, 300 cc on 03/30/2012 for a hematocrit of 51.2, and 200 cc on 05/07/2015 for a hematocrit of 51.2.  Ferritin was 34 and iron saturation of 32% on 05/06/2015  She has a history of melanoma x 3.  She had a T1a melanoma in 02/2011.  She has a history of high potassium and calcium.  She is off oral calcium.  She avoids foods high in potassium.  Symptomatically, she feels good.  She is active.  Exam is unremarkable.  Plan: 1. Review labs from last visit and today. 2. Obtain copy of original JAK2 testing- done. 3. RTC in 3 months for labs (CBC with diff) +/- phlebotomy. 4. RTC in 6 months for MD assessment, labs (CBC with diff) +/- phlebotomy.   Lequita Asal, MD  08/08/2015, 11:11 AM

## 2015-08-09 ENCOUNTER — Encounter: Payer: Self-pay | Admitting: Hematology and Oncology

## 2015-08-18 ENCOUNTER — Other Ambulatory Visit: Payer: Self-pay | Admitting: Nurse Practitioner

## 2015-08-18 DIAGNOSIS — I10 Essential (primary) hypertension: Secondary | ICD-10-CM | POA: Diagnosis not present

## 2015-08-18 DIAGNOSIS — E039 Hypothyroidism, unspecified: Secondary | ICD-10-CM | POA: Diagnosis not present

## 2015-08-18 DIAGNOSIS — E875 Hyperkalemia: Secondary | ICD-10-CM | POA: Diagnosis not present

## 2015-08-18 DIAGNOSIS — E782 Mixed hyperlipidemia: Secondary | ICD-10-CM | POA: Diagnosis not present

## 2015-08-18 DIAGNOSIS — Z1231 Encounter for screening mammogram for malignant neoplasm of breast: Secondary | ICD-10-CM

## 2015-08-22 DIAGNOSIS — C4359 Malignant melanoma of other part of trunk: Secondary | ICD-10-CM | POA: Diagnosis not present

## 2015-08-28 ENCOUNTER — Ambulatory Visit: Payer: Medicare Other

## 2015-09-01 ENCOUNTER — Other Ambulatory Visit: Payer: Self-pay | Admitting: Nurse Practitioner

## 2015-09-01 ENCOUNTER — Ambulatory Visit
Admission: RE | Admit: 2015-09-01 | Discharge: 2015-09-01 | Disposition: A | Discharge status: Home / Self Care | Payer: Medicare Other | Source: Ambulatory Visit | Attending: Nurse Practitioner | Admitting: Nurse Practitioner

## 2015-09-01 DIAGNOSIS — Z1231 Encounter for screening mammogram for malignant neoplasm of breast: Secondary | ICD-10-CM

## 2015-09-04 DIAGNOSIS — J3489 Other specified disorders of nose and nasal sinuses: Secondary | ICD-10-CM | POA: Diagnosis not present

## 2015-09-17 DIAGNOSIS — H5203 Hypermetropia, bilateral: Secondary | ICD-10-CM | POA: Diagnosis not present

## 2015-09-17 DIAGNOSIS — H2513 Age-related nuclear cataract, bilateral: Secondary | ICD-10-CM | POA: Diagnosis not present

## 2015-09-17 DIAGNOSIS — D3140 Benign neoplasm of unspecified ciliary body: Secondary | ICD-10-CM | POA: Diagnosis not present

## 2015-09-17 DIAGNOSIS — H52223 Regular astigmatism, bilateral: Secondary | ICD-10-CM | POA: Diagnosis not present

## 2015-09-25 DIAGNOSIS — E875 Hyperkalemia: Secondary | ICD-10-CM | POA: Diagnosis not present

## 2015-09-25 DIAGNOSIS — E039 Hypothyroidism, unspecified: Secondary | ICD-10-CM | POA: Diagnosis not present

## 2015-11-06 ENCOUNTER — Inpatient Hospital Stay: Payer: Medicare Other

## 2015-11-06 ENCOUNTER — Inpatient Hospital Stay: Payer: Medicare Other | Attending: Hematology and Oncology

## 2015-11-06 DIAGNOSIS — Z85828 Personal history of other malignant neoplasm of skin: Secondary | ICD-10-CM | POA: Insufficient documentation

## 2015-11-06 DIAGNOSIS — D751 Secondary polycythemia: Secondary | ICD-10-CM | POA: Diagnosis not present

## 2015-11-06 DIAGNOSIS — G4730 Sleep apnea, unspecified: Secondary | ICD-10-CM | POA: Diagnosis not present

## 2015-11-06 DIAGNOSIS — Z08 Encounter for follow-up examination after completed treatment for malignant neoplasm: Secondary | ICD-10-CM | POA: Diagnosis not present

## 2015-11-06 DIAGNOSIS — Z79899 Other long term (current) drug therapy: Secondary | ICD-10-CM | POA: Insufficient documentation

## 2015-11-06 DIAGNOSIS — Z872 Personal history of diseases of the skin and subcutaneous tissue: Secondary | ICD-10-CM | POA: Diagnosis not present

## 2015-11-06 DIAGNOSIS — Z85820 Personal history of malignant melanoma of skin: Secondary | ICD-10-CM | POA: Diagnosis not present

## 2015-11-06 DIAGNOSIS — L821 Other seborrheic keratosis: Secondary | ICD-10-CM | POA: Diagnosis not present

## 2015-11-06 LAB — CBC WITH DIFFERENTIAL/PLATELET
Basophils Absolute: 0.1 10*3/uL (ref 0–0.1)
Basophils Relative: 1 %
Eosinophils Absolute: 0.1 10*3/uL (ref 0–0.7)
Eosinophils Relative: 2 %
HCT: 44.2 % (ref 35.0–47.0)
Hemoglobin: 14.9 g/dL (ref 12.0–16.0)
Lymphocytes Relative: 34 %
Lymphs Abs: 2 10*3/uL (ref 1.0–3.6)
MCH: 30.6 pg (ref 26.0–34.0)
MCHC: 33.7 g/dL (ref 32.0–36.0)
MCV: 90.8 fL (ref 80.0–100.0)
Monocytes Absolute: 0.5 10*3/uL (ref 0.2–0.9)
Monocytes Relative: 9 %
Neutro Abs: 3.2 10*3/uL (ref 1.4–6.5)
Neutrophils Relative %: 54 %
Platelets: 224 10*3/uL (ref 150–440)
RBC: 4.87 MIL/uL (ref 3.80–5.20)
RDW: 14.3 % (ref 11.5–14.5)
WBC: 5.9 10*3/uL (ref 3.6–11.0)

## 2015-11-24 DIAGNOSIS — J4530 Mild persistent asthma, uncomplicated: Secondary | ICD-10-CM | POA: Diagnosis not present

## 2015-12-18 DIAGNOSIS — E039 Hypothyroidism, unspecified: Secondary | ICD-10-CM | POA: Diagnosis not present

## 2015-12-18 DIAGNOSIS — D751 Secondary polycythemia: Secondary | ICD-10-CM | POA: Diagnosis not present

## 2015-12-18 DIAGNOSIS — J4530 Mild persistent asthma, uncomplicated: Secondary | ICD-10-CM | POA: Diagnosis not present

## 2015-12-18 DIAGNOSIS — R634 Abnormal weight loss: Secondary | ICD-10-CM | POA: Diagnosis not present

## 2015-12-18 DIAGNOSIS — I10 Essential (primary) hypertension: Secondary | ICD-10-CM | POA: Diagnosis not present

## 2015-12-18 DIAGNOSIS — F411 Generalized anxiety disorder: Secondary | ICD-10-CM | POA: Diagnosis not present

## 2015-12-31 DIAGNOSIS — M85862 Other specified disorders of bone density and structure, left lower leg: Secondary | ICD-10-CM | POA: Diagnosis not present

## 2015-12-31 DIAGNOSIS — M810 Age-related osteoporosis without current pathological fracture: Secondary | ICD-10-CM | POA: Diagnosis not present

## 2016-02-05 ENCOUNTER — Inpatient Hospital Stay: Payer: Medicare Other | Attending: Hematology and Oncology

## 2016-02-05 ENCOUNTER — Inpatient Hospital Stay: Payer: Medicare Other

## 2016-02-05 ENCOUNTER — Inpatient Hospital Stay (HOSPITAL_BASED_OUTPATIENT_CLINIC_OR_DEPARTMENT_OTHER): Payer: Medicare Other | Admitting: Hematology and Oncology

## 2016-02-05 VITALS — BP 110/73 | HR 73 | Temp 98.9°F | Resp 18 | Wt 97.9 lb

## 2016-02-05 DIAGNOSIS — Z79899 Other long term (current) drug therapy: Secondary | ICD-10-CM

## 2016-02-05 DIAGNOSIS — D225 Melanocytic nevi of trunk: Secondary | ICD-10-CM | POA: Diagnosis not present

## 2016-02-05 DIAGNOSIS — D751 Secondary polycythemia: Secondary | ICD-10-CM | POA: Insufficient documentation

## 2016-02-05 DIAGNOSIS — M810 Age-related osteoporosis without current pathological fracture: Secondary | ICD-10-CM | POA: Diagnosis not present

## 2016-02-05 DIAGNOSIS — D0439 Carcinoma in situ of skin of other parts of face: Secondary | ICD-10-CM | POA: Diagnosis not present

## 2016-02-05 DIAGNOSIS — D485 Neoplasm of uncertain behavior of skin: Secondary | ICD-10-CM | POA: Diagnosis not present

## 2016-02-05 DIAGNOSIS — D2261 Melanocytic nevi of right upper limb, including shoulder: Secondary | ICD-10-CM | POA: Diagnosis not present

## 2016-02-05 DIAGNOSIS — D2272 Melanocytic nevi of left lower limb, including hip: Secondary | ICD-10-CM | POA: Diagnosis not present

## 2016-02-05 DIAGNOSIS — Z85820 Personal history of malignant melanoma of skin: Secondary | ICD-10-CM

## 2016-02-05 LAB — CBC WITH DIFFERENTIAL/PLATELET
Basophils Absolute: 0.1 10*3/uL (ref 0–0.1)
Basophils Relative: 1 %
Eosinophils Absolute: 0.1 10*3/uL (ref 0–0.7)
Eosinophils Relative: 2 %
HCT: 44.5 % (ref 35.0–47.0)
Hemoglobin: 15.1 g/dL (ref 12.0–16.0)
Lymphocytes Relative: 29 %
Lymphs Abs: 2.1 10*3/uL (ref 1.0–3.6)
MCH: 31 pg (ref 26.0–34.0)
MCHC: 33.9 g/dL (ref 32.0–36.0)
MCV: 91.3 fL (ref 80.0–100.0)
Monocytes Absolute: 0.7 10*3/uL (ref 0.2–0.9)
Monocytes Relative: 10 %
Neutro Abs: 4.3 10*3/uL (ref 1.4–6.5)
Neutrophils Relative %: 58 %
Platelets: 225 10*3/uL (ref 150–440)
RBC: 4.88 MIL/uL (ref 3.80–5.20)
RDW: 14.4 % (ref 11.5–14.5)
WBC: 7.3 10*3/uL (ref 3.6–11.0)

## 2016-02-05 NOTE — Progress Notes (Signed)
[facility] day:  02/05/16  Chief Complaint: Sabrina Zamora is a 82 y.o. female with a history of secondary polycythemia who is seen for 6 month assessment.  HPI:  The patient was last seen in the medical oncology clinic on 08/08/2015.  At that time,  she felt good.  Exam was unremarkable.  CBC revealed a hematocrit of 48.2, hemoglobin 16.0 and MCV 92.9.  She did not undergo phlebotomy.  Labs on 11/06/2015 revealed a hematocrit of 44.2, hemoglobin 14.9, MCV 90.8, platelets 224,000, and WBC 5900.  During the interim, she has felt good.  She feels like a "regular 82 year old".  Appetite is good.  She is off her supplements.    Past Medical History:  Diagnosis Date  . Osteoporosis   . Skin cancer 2012, 2016    Past Surgical History:  Procedure Laterality Date  . BREAST BIOPSY Left 1988   excisional - neg  . BREAST BIOPSY Left 2003   ccore - neg  . CHOLECYSTECTOMY      Family History  Problem Relation Age of Onset  . Clotting disorder Sister   . Breast cancer Sister 4    Social History:  reports that she has never smoked. She does not have any smokeless tobacco history on file. Her alcohol and drug histories are not on file.  She has never smoked.  The patient is alone today.  Allergies:  Allergies  Allergen Reactions  . Levofloxacin Rash and Shortness Of Breath  . Penicillins Hives and Rash  . Betaine Rash  . Ciprofloxacin Itching and Rash  . Sulfa Antibiotics Rash    Current Medications: Current Outpatient Prescriptions  Medication Sig Dispense Refill  . aspirin EC 81 MG tablet Take 81 mg by mouth.    . Cholecalciferol (VITAMIN D PO) Take by mouth.    . docusate sodium (COLACE) 100 MG capsule Take 100 mg by mouth.    . Fluticasone-Salmeterol (ADVAIR) 250-50 MCG/DOSE AEPB Inhale into the lungs.    Marland Kitchen levothyroxine (SYNTHROID, LEVOTHROID) 25 MCG tablet Take 25 mcg by mouth daily.     Marland Kitchen lisinopril (PRINIVIL,ZESTRIL) 10 MG  tablet Take 10 mg by mouth.    . lovastatin (MEVACOR) 20 MG tablet Take 20 mg by mouth.    . mupirocin nasal ointment (BACTROBAN) 2 % Place into the nose.    Marland Kitchen omeprazole (PRILOSEC) 20 MG capsule Take 20 mg by mouth every other day.     . Multiple Vitamin (MULTI-VITAMINS) TABS Take by mouth.     No current facility-administered medications for this visit.     Review of Systems:  GENERAL:  Feels good.  No fevers, sweats or weight loss.  Weight fluctuates (up 5 pounds). PERFORMANCE STATUS (ECOG):  1 HEENT:  No visual changes, runny nose, sore throat, mouth sores or tenderness. Lungs: No shortness of breath or cough.  No hemoptysis. Cardiac:  No chest pain, palpitations, orthopnea, or PND. GI:  Poor appetite (food choices low in potassium and calcium).  No nausea, vomiting, diarrhea, constipation, melena or hematochezia. GU:  No urgency, frequency, dysuria, or hematuria. Musculoskeletal:  No back pain.  No joint pain.  No muscle tenderness. Extremities:  No pain or swelling. Skin:  No rashes or skin changes. Neuro:  No headache, numbness or weakness, balance or coordination issues. Endocrine:  Thyroid disease on Synthroid.  No diabetes, hot flashes or night sweats. Psych:  No mood changes, depression or anxiety. Pain:  No focal  pain. Review of systems:  All other systems reviewed and found to be negative.  Physical Exam: Blood pressure 110/73, pulse 73, temperature 98.9 F (37.2 C), temperature source Tympanic, resp. rate 18, weight 97 lb 14.2 oz (44.4 kg). GENERAL:  Thin woman sitting comfortably in the exam room in no acute distress. MENTAL STATUS:  Alert and oriented to person, place and time. HEAD:  Styled dark blonde hair.  Normocephalic, atraumatic, face symmetric, no Cushingoid features. EYES:  Blue eyes.  Pupils equal round and reactive to light and accomodation.  No conjunctivitis or scleral icterus. ENT:  Bandaide on nose.  Oropharynx clear without lesion.  Torus.  Tongue  normal. Mucous membranes moist.  RESPIRATORY:  Clear to auscultation without rales, wheezes or rhonchi. CARDIOVASCULAR:  Regular rate and rhythm without murmur, rub or gallop. ABDOMEN:  Soft, non-tender, with active bowel sounds, and no hepatosplenomegaly.  No masses. SKIN:  No rashes, ulcers or lesions. EXTREMITIES: No edema, no skin discoloration or tenderness.  No palpable cords. LYMPH NODES: No palpable cervical, supraclavicular, axillary or inguinal adenopathy  NEUROLOGICAL: Unremarkable. PSYCH:  Appropriate.   Appointment on 02/05/2016  Component Date Value Ref Range Status  . WBC 02/05/2016 7.3  3.6 - 11.0 K/uL Final  . RBC 02/05/2016 4.88  3.80 - 5.20 MIL/uL Final  . Hemoglobin 02/05/2016 15.1  12.0 - 16.0 g/dL Final  . HCT 02/05/2016 44.5  35.0 - 47.0 % Final  . MCV 02/05/2016 91.3  80.0 - 100.0 fL Final  . MCH 02/05/2016 31.0  26.0 - 34.0 pg Final  . MCHC 02/05/2016 33.9  32.0 - 36.0 g/dL Final  . RDW 02/05/2016 14.4  11.5 - 14.5 % Final  . Platelets 02/05/2016 225  150 - 440 K/uL Final  . Neutrophils Relative % 02/05/2016 58  % Final  . Neutro Abs 02/05/2016 4.3  1.4 - 6.5 K/uL Final  . Lymphocytes Relative 02/05/2016 29  % Final  . Lymphs Abs 02/05/2016 2.1  1.0 - 3.6 K/uL Final  . Monocytes Relative 02/05/2016 10  % Final  . Monocytes Absolute 02/05/2016 0.7  0.2 - 0.9 K/uL Final  . Eosinophils Relative 02/05/2016 2  % Final  . Eosinophils Absolute 02/05/2016 0.1  0 - 0.7 K/uL Final  . Basophils Relative 02/05/2016 1  % Final  . Basophils Absolute 02/05/2016 0.1  0 - 0.1 K/uL Final    Assessment:  Sabrina Zamora is a 82 y.o. female with secondary polycythemia first noted in 2013.  She has known sleep apnea (wears oxygen at night but no CPAP mask) and asthma.  She has never smoked.  She is unaware of any cardiac issues.  Work-up on 01/11/2012 revealed a negative JAK2 V617F and exon 12 mutation.  She had a mildy elevated erythropoietin level (46.9) with repeat normal  (4.4) on 05/06/2015.  Initial carboxyhemoglobin was 4.6% (high) then 2.3% (normal) on 05/06/2015.   WBC and platelet count have been normal.  Abdominal ultrasound was normal.  She has undergone phlebotomy x 3 to keep hematocrit < 48 then later to keep < 50.  She underwent phlebotomy of 200 cc on 01/11/2012 for a hematocrit of 48.1, 300 cc on 03/30/2012 for a hematocrit of 51.2, and 200 cc on 05/07/2015 for a hematocrit of 51.2.  Ferritin was 34 and iron saturation of 32% on 05/06/2015  She has a history of melanoma x 3.  She had a T1a melanoma in 02/2011.  She has a history of high potassium and calcium.  She  is off oral calcium.  She avoids foods high in potassium.  Symptomatically, she feels good.  Exam is unremarkable.  Plan: 1. Labs today:  CBC with diff. 2. Review interval labs and today. 3.  RTC every 4 months for CBC +/- phlebotomy 4.  RTC in 1 year for MD assessment, labs (CBC with diff) +/- phlebotomy.   Lequita Asal, MD  02/05/2016, 2:40 PM

## 2016-02-05 NOTE — Progress Notes (Signed)
Patient is here for follow up, she is by herself and no complaints today.

## 2016-02-08 ENCOUNTER — Encounter: Payer: Self-pay | Admitting: Hematology and Oncology

## 2016-02-10 DIAGNOSIS — M810 Age-related osteoporosis without current pathological fracture: Secondary | ICD-10-CM | POA: Diagnosis not present

## 2016-02-18 DIAGNOSIS — C4359 Malignant melanoma of other part of trunk: Secondary | ICD-10-CM | POA: Diagnosis not present

## 2016-02-18 DIAGNOSIS — Z681 Body mass index (BMI) 19 or less, adult: Secondary | ICD-10-CM | POA: Diagnosis not present

## 2016-03-10 DIAGNOSIS — E039 Hypothyroidism, unspecified: Secondary | ICD-10-CM | POA: Diagnosis not present

## 2016-03-10 DIAGNOSIS — E782 Mixed hyperlipidemia: Secondary | ICD-10-CM | POA: Diagnosis not present

## 2016-03-10 DIAGNOSIS — I10 Essential (primary) hypertension: Secondary | ICD-10-CM | POA: Diagnosis not present

## 2016-03-24 DIAGNOSIS — M810 Age-related osteoporosis without current pathological fracture: Secondary | ICD-10-CM | POA: Diagnosis not present

## 2016-04-02 DIAGNOSIS — I10 Essential (primary) hypertension: Secondary | ICD-10-CM | POA: Diagnosis not present

## 2016-04-02 DIAGNOSIS — N390 Urinary tract infection, site not specified: Secondary | ICD-10-CM | POA: Diagnosis not present

## 2016-04-05 DIAGNOSIS — E611 Iron deficiency: Secondary | ICD-10-CM | POA: Diagnosis not present

## 2016-04-05 DIAGNOSIS — E538 Deficiency of other specified B group vitamins: Secondary | ICD-10-CM | POA: Diagnosis not present

## 2016-04-05 DIAGNOSIS — I10 Essential (primary) hypertension: Secondary | ICD-10-CM | POA: Diagnosis not present

## 2016-04-05 DIAGNOSIS — Z0001 Encounter for general adult medical examination with abnormal findings: Secondary | ICD-10-CM | POA: Diagnosis not present

## 2016-04-05 DIAGNOSIS — E559 Vitamin D deficiency, unspecified: Secondary | ICD-10-CM | POA: Diagnosis not present

## 2016-04-05 DIAGNOSIS — E039 Hypothyroidism, unspecified: Secondary | ICD-10-CM | POA: Diagnosis not present

## 2016-04-05 DIAGNOSIS — E782 Mixed hyperlipidemia: Secondary | ICD-10-CM | POA: Diagnosis not present

## 2016-04-05 DIAGNOSIS — D509 Iron deficiency anemia, unspecified: Secondary | ICD-10-CM | POA: Diagnosis not present

## 2016-04-09 DIAGNOSIS — H52223 Regular astigmatism, bilateral: Secondary | ICD-10-CM | POA: Diagnosis not present

## 2016-04-09 DIAGNOSIS — K649 Unspecified hemorrhoids: Secondary | ICD-10-CM | POA: Diagnosis not present

## 2016-04-09 DIAGNOSIS — B373 Candidiasis of vulva and vagina: Secondary | ICD-10-CM | POA: Diagnosis not present

## 2016-04-09 DIAGNOSIS — H5203 Hypermetropia, bilateral: Secondary | ICD-10-CM | POA: Diagnosis not present

## 2016-04-09 DIAGNOSIS — E782 Mixed hyperlipidemia: Secondary | ICD-10-CM | POA: Diagnosis not present

## 2016-04-09 DIAGNOSIS — K621 Rectal polyp: Secondary | ICD-10-CM | POA: Diagnosis not present

## 2016-04-09 DIAGNOSIS — I10 Essential (primary) hypertension: Secondary | ICD-10-CM | POA: Diagnosis not present

## 2016-04-09 DIAGNOSIS — H2513 Age-related nuclear cataract, bilateral: Secondary | ICD-10-CM | POA: Diagnosis not present

## 2016-04-09 DIAGNOSIS — D3140 Benign neoplasm of unspecified ciliary body: Secondary | ICD-10-CM | POA: Diagnosis not present

## 2016-04-22 DIAGNOSIS — K5900 Constipation, unspecified: Secondary | ICD-10-CM | POA: Diagnosis not present

## 2016-04-22 DIAGNOSIS — K648 Other hemorrhoids: Secondary | ICD-10-CM | POA: Diagnosis not present

## 2016-05-04 DIAGNOSIS — Z0001 Encounter for general adult medical examination with abnormal findings: Secondary | ICD-10-CM | POA: Diagnosis not present

## 2016-05-04 DIAGNOSIS — N390 Urinary tract infection, site not specified: Secondary | ICD-10-CM | POA: Diagnosis not present

## 2016-05-04 DIAGNOSIS — K649 Unspecified hemorrhoids: Secondary | ICD-10-CM | POA: Diagnosis not present

## 2016-05-04 DIAGNOSIS — M810 Age-related osteoporosis without current pathological fracture: Secondary | ICD-10-CM | POA: Diagnosis not present

## 2016-05-04 DIAGNOSIS — D751 Secondary polycythemia: Secondary | ICD-10-CM | POA: Diagnosis not present

## 2016-05-04 DIAGNOSIS — Z23 Encounter for immunization: Secondary | ICD-10-CM | POA: Diagnosis not present

## 2016-05-04 DIAGNOSIS — I10 Essential (primary) hypertension: Secondary | ICD-10-CM | POA: Diagnosis not present

## 2016-05-12 DIAGNOSIS — Z08 Encounter for follow-up examination after completed treatment for malignant neoplasm: Secondary | ICD-10-CM | POA: Diagnosis not present

## 2016-05-12 DIAGNOSIS — Z85820 Personal history of malignant melanoma of skin: Secondary | ICD-10-CM | POA: Diagnosis not present

## 2016-05-12 DIAGNOSIS — D1801 Hemangioma of skin and subcutaneous tissue: Secondary | ICD-10-CM | POA: Diagnosis not present

## 2016-05-12 DIAGNOSIS — Z85828 Personal history of other malignant neoplasm of skin: Secondary | ICD-10-CM | POA: Diagnosis not present

## 2016-06-07 ENCOUNTER — Other Ambulatory Visit: Payer: Self-pay | Admitting: Hematology and Oncology

## 2016-06-07 ENCOUNTER — Inpatient Hospital Stay: Payer: Medicare Other

## 2016-06-07 ENCOUNTER — Inpatient Hospital Stay: Payer: Medicare Other | Attending: Internal Medicine

## 2016-06-07 DIAGNOSIS — Z85820 Personal history of malignant melanoma of skin: Secondary | ICD-10-CM | POA: Diagnosis not present

## 2016-06-07 DIAGNOSIS — D751 Secondary polycythemia: Secondary | ICD-10-CM | POA: Diagnosis not present

## 2016-06-07 DIAGNOSIS — Z79899 Other long term (current) drug therapy: Secondary | ICD-10-CM | POA: Insufficient documentation

## 2016-06-07 DIAGNOSIS — M810 Age-related osteoporosis without current pathological fracture: Secondary | ICD-10-CM | POA: Insufficient documentation

## 2016-06-07 LAB — CBC
HCT: 46.5 % (ref 35.0–47.0)
Hemoglobin: 15.7 g/dL (ref 12.0–16.0)
MCH: 31 pg (ref 26.0–34.0)
MCHC: 33.7 g/dL (ref 32.0–36.0)
MCV: 92 fL (ref 80.0–100.0)
Platelets: 215 10*3/uL (ref 150–440)
RBC: 5.05 MIL/uL (ref 3.80–5.20)
RDW: 14.2 % (ref 11.5–14.5)
WBC: 6.3 10*3/uL (ref 3.6–11.0)

## 2016-07-21 ENCOUNTER — Other Ambulatory Visit: Payer: Self-pay | Admitting: Nurse Practitioner

## 2016-07-21 DIAGNOSIS — Z1231 Encounter for screening mammogram for malignant neoplasm of breast: Secondary | ICD-10-CM

## 2016-08-18 DIAGNOSIS — C4359 Malignant melanoma of other part of trunk: Secondary | ICD-10-CM | POA: Diagnosis not present

## 2016-08-18 DIAGNOSIS — Z681 Body mass index (BMI) 19 or less, adult: Secondary | ICD-10-CM | POA: Diagnosis not present

## 2016-09-01 ENCOUNTER — Ambulatory Visit
Admission: RE | Admit: 2016-09-01 | Discharge: 2016-09-01 | Disposition: A | Discharge status: Home / Self Care | Payer: Medicare Other | Source: Ambulatory Visit | Attending: Nurse Practitioner | Admitting: Nurse Practitioner

## 2016-09-01 DIAGNOSIS — Z85820 Personal history of malignant melanoma of skin: Secondary | ICD-10-CM | POA: Diagnosis not present

## 2016-09-01 DIAGNOSIS — Z08 Encounter for follow-up examination after completed treatment for malignant neoplasm: Secondary | ICD-10-CM | POA: Diagnosis not present

## 2016-09-01 DIAGNOSIS — Z1231 Encounter for screening mammogram for malignant neoplasm of breast: Secondary | ICD-10-CM | POA: Insufficient documentation

## 2016-09-01 DIAGNOSIS — X32XXXA Exposure to sunlight, initial encounter: Secondary | ICD-10-CM | POA: Diagnosis not present

## 2016-09-01 DIAGNOSIS — Z85828 Personal history of other malignant neoplasm of skin: Secondary | ICD-10-CM | POA: Diagnosis not present

## 2016-09-01 DIAGNOSIS — L821 Other seborrheic keratosis: Secondary | ICD-10-CM | POA: Diagnosis not present

## 2016-09-01 DIAGNOSIS — D485 Neoplasm of uncertain behavior of skin: Secondary | ICD-10-CM | POA: Diagnosis not present

## 2016-09-01 DIAGNOSIS — L570 Actinic keratosis: Secondary | ICD-10-CM | POA: Diagnosis not present

## 2016-09-17 DIAGNOSIS — J4540 Moderate persistent asthma, uncomplicated: Secondary | ICD-10-CM | POA: Diagnosis not present

## 2016-09-17 DIAGNOSIS — D751 Secondary polycythemia: Secondary | ICD-10-CM | POA: Diagnosis not present

## 2016-09-17 DIAGNOSIS — L905 Scar conditions and fibrosis of skin: Secondary | ICD-10-CM | POA: Diagnosis not present

## 2016-09-17 DIAGNOSIS — I10 Essential (primary) hypertension: Secondary | ICD-10-CM | POA: Diagnosis not present

## 2016-09-17 DIAGNOSIS — D225 Melanocytic nevi of trunk: Secondary | ICD-10-CM | POA: Diagnosis not present

## 2016-09-17 DIAGNOSIS — I714 Abdominal aortic aneurysm, without rupture: Secondary | ICD-10-CM | POA: Diagnosis not present

## 2016-10-05 ENCOUNTER — Inpatient Hospital Stay: Payer: Medicare Other | Attending: Internal Medicine

## 2016-10-05 ENCOUNTER — Inpatient Hospital Stay: Payer: Medicare Other

## 2016-10-05 DIAGNOSIS — Z79899 Other long term (current) drug therapy: Secondary | ICD-10-CM | POA: Diagnosis not present

## 2016-10-05 DIAGNOSIS — D751 Secondary polycythemia: Secondary | ICD-10-CM | POA: Diagnosis not present

## 2016-10-05 DIAGNOSIS — M810 Age-related osteoporosis without current pathological fracture: Secondary | ICD-10-CM | POA: Insufficient documentation

## 2016-10-05 DIAGNOSIS — Z85820 Personal history of malignant melanoma of skin: Secondary | ICD-10-CM | POA: Diagnosis not present

## 2016-10-05 LAB — CBC WITH DIFFERENTIAL/PLATELET
Basophils Absolute: 0.1 10*3/uL (ref 0–0.1)
Basophils Relative: 1 %
Eosinophils Absolute: 0.1 10*3/uL (ref 0–0.7)
Eosinophils Relative: 2 %
HCT: 46.2 % (ref 35.0–47.0)
Hemoglobin: 15.6 g/dL (ref 12.0–16.0)
Lymphocytes Relative: 35 %
Lymphs Abs: 2.3 10*3/uL (ref 1.0–3.6)
MCH: 31.5 pg (ref 26.0–34.0)
MCHC: 33.8 g/dL (ref 32.0–36.0)
MCV: 93.2 fL (ref 80.0–100.0)
Monocytes Absolute: 0.6 10*3/uL (ref 0.2–0.9)
Monocytes Relative: 9 %
Neutro Abs: 3.5 10*3/uL (ref 1.4–6.5)
Neutrophils Relative %: 53 %
Platelets: 222 10*3/uL (ref 150–440)
RBC: 4.95 MIL/uL (ref 3.80–5.20)
RDW: 14.1 % (ref 11.5–14.5)
WBC: 6.6 10*3/uL (ref 3.6–11.0)

## 2016-10-05 NOTE — Progress Notes (Signed)
Patient does not need phlebotomy today per parameters. LJ

## 2016-10-06 DIAGNOSIS — H5203 Hypermetropia, bilateral: Secondary | ICD-10-CM | POA: Diagnosis not present

## 2016-10-06 DIAGNOSIS — H52223 Regular astigmatism, bilateral: Secondary | ICD-10-CM | POA: Diagnosis not present

## 2016-10-06 DIAGNOSIS — H2513 Age-related nuclear cataract, bilateral: Secondary | ICD-10-CM | POA: Diagnosis not present

## 2016-10-06 DIAGNOSIS — D3140 Benign neoplasm of unspecified ciliary body: Secondary | ICD-10-CM | POA: Diagnosis not present

## 2016-11-03 DIAGNOSIS — M810 Age-related osteoporosis without current pathological fracture: Secondary | ICD-10-CM | POA: Diagnosis not present

## 2016-11-24 DIAGNOSIS — M810 Age-related osteoporosis without current pathological fracture: Secondary | ICD-10-CM | POA: Diagnosis not present

## 2016-12-02 DIAGNOSIS — D225 Melanocytic nevi of trunk: Secondary | ICD-10-CM | POA: Diagnosis not present

## 2016-12-02 DIAGNOSIS — D2261 Melanocytic nevi of right upper limb, including shoulder: Secondary | ICD-10-CM | POA: Diagnosis not present

## 2016-12-02 DIAGNOSIS — D2272 Melanocytic nevi of left lower limb, including hip: Secondary | ICD-10-CM | POA: Diagnosis not present

## 2016-12-02 DIAGNOSIS — D2262 Melanocytic nevi of left upper limb, including shoulder: Secondary | ICD-10-CM | POA: Diagnosis not present

## 2017-01-10 DIAGNOSIS — R0602 Shortness of breath: Secondary | ICD-10-CM | POA: Diagnosis not present

## 2017-01-10 DIAGNOSIS — J4530 Mild persistent asthma, uncomplicated: Secondary | ICD-10-CM | POA: Diagnosis not present

## 2017-01-10 DIAGNOSIS — R05 Cough: Secondary | ICD-10-CM | POA: Diagnosis not present

## 2017-02-04 ENCOUNTER — Inpatient Hospital Stay: Payer: Medicare Other | Attending: Internal Medicine

## 2017-02-04 ENCOUNTER — Inpatient Hospital Stay (HOSPITAL_BASED_OUTPATIENT_CLINIC_OR_DEPARTMENT_OTHER): Payer: Medicare Other | Admitting: Hematology and Oncology

## 2017-02-04 ENCOUNTER — Encounter: Payer: Self-pay | Admitting: Hematology and Oncology

## 2017-02-04 ENCOUNTER — Other Ambulatory Visit: Payer: Self-pay

## 2017-02-04 ENCOUNTER — Inpatient Hospital Stay: Payer: Medicare Other

## 2017-02-04 VITALS — BP 127/81 | HR 80 | Temp 98.2°F | Resp 18 | Wt 97.4 lb

## 2017-02-04 DIAGNOSIS — D751 Secondary polycythemia: Secondary | ICD-10-CM | POA: Diagnosis not present

## 2017-02-04 DIAGNOSIS — Z9981 Dependence on supplemental oxygen: Secondary | ICD-10-CM | POA: Insufficient documentation

## 2017-02-04 DIAGNOSIS — J45909 Unspecified asthma, uncomplicated: Secondary | ICD-10-CM | POA: Diagnosis not present

## 2017-02-04 DIAGNOSIS — Z79899 Other long term (current) drug therapy: Secondary | ICD-10-CM | POA: Diagnosis not present

## 2017-02-04 DIAGNOSIS — Z7982 Long term (current) use of aspirin: Secondary | ICD-10-CM | POA: Insufficient documentation

## 2017-02-04 DIAGNOSIS — Z85820 Personal history of malignant melanoma of skin: Secondary | ICD-10-CM | POA: Insufficient documentation

## 2017-02-04 LAB — CBC WITH DIFFERENTIAL/PLATELET
Basophils Absolute: 0 10*3/uL (ref 0–0.1)
Basophils Relative: 1 %
Eosinophils Absolute: 0.1 10*3/uL (ref 0–0.7)
Eosinophils Relative: 2 %
HCT: 46.1 % (ref 35.0–47.0)
Hemoglobin: 15.6 g/dL (ref 12.0–16.0)
Lymphocytes Relative: 34 %
Lymphs Abs: 2.1 10*3/uL (ref 1.0–3.6)
MCH: 31.2 pg (ref 26.0–34.0)
MCHC: 33.9 g/dL (ref 32.0–36.0)
MCV: 92.2 fL (ref 80.0–100.0)
Monocytes Absolute: 0.7 10*3/uL (ref 0.2–0.9)
Monocytes Relative: 11 %
Neutro Abs: 3.3 10*3/uL (ref 1.4–6.5)
Neutrophils Relative %: 52 %
Platelets: 212 10*3/uL (ref 150–440)
RBC: 5.01 MIL/uL (ref 3.80–5.20)
RDW: 13.9 % (ref 11.5–14.5)
WBC: 6.2 10*3/uL (ref 3.6–11.0)

## 2017-02-04 NOTE — Progress Notes (Signed)
[facility] day:  02/04/17  Chief Complaint: Sabrina Zamora is a 83 y.o. female with a history of secondary polycythemia who is seen for 6 month assessment.  HPI:  The patient was last seen in the medical oncology clinic on 02/05/2016.  At that time, she felt good.  Exam was unremarkable.  CBC revealed a hematocrit of 48.2, hemoglobin 16.0 and MCV 92.9.  She did not undergo phlebotomy.  Labs on 10/05/2016 revealed a hematocrit of 46.2, hemoglobin 15.6, MCV 93.8, platelets 222,000, and WBC 6600.  During the interim, she has done well.  Her only issue is her age.    Past Medical History:  Diagnosis Date  . Osteoporosis   . Skin cancer 2012, 2016    Past Surgical History:  Procedure Laterality Date  . BREAST BIOPSY Left 2003   neg  . BREAST EXCISIONAL BIOPSY Left 1988   excisional - neg  . CHOLECYSTECTOMY      Family History  Problem Relation Age of Onset  . Clotting disorder Sister   . Breast cancer Sister 30    Social History:  reports that she has never smoked. She does not have any smokeless tobacco history on file. Her alcohol and drug histories are not on file.  She has never smoked.  The patient is alone today.  Allergies:  Allergies  Allergen Reactions  . Levofloxacin Rash and Shortness Of Breath  . Penicillins Hives and Rash  . Betaine Rash  . Ciprofloxacin Itching and Rash  . Sulfa Antibiotics Rash    Current Medications: Current Outpatient Prescriptions  Medication Sig Dispense Refill  . aspirin EC 81 MG tablet Take 81 mg by mouth.    . Cholecalciferol (VITAMIN D PO) Take by mouth.    . Fluticasone-Salmeterol (ADVAIR) 250-50 MCG/DOSE AEPB Inhale into the lungs.    Marland Kitchen levothyroxine (SYNTHROID, LEVOTHROID) 25 MCG tablet Take 25 mcg by mouth daily.     Marland Kitchen lisinopril (PRINIVIL,ZESTRIL) 10 MG tablet Take 10 mg by mouth.    . lovastatin (MEVACOR) 20 MG tablet Take 20 mg by mouth.    . Multiple Vitamin  (MULTI-VITAMINS) TABS Take by mouth.    Marland Kitchen omeprazole (PRILOSEC) 20 MG capsule Take 20 mg by mouth every other day.     . docusate sodium (COLACE) 100 MG capsule Take 100 mg by mouth.    . mupirocin nasal ointment (BACTROBAN) 2 % Place into the nose.     No current facility-administered medications for this visit.     Review of Systems:  GENERAL:  Feels good.  No fevers, sweats or weight loss.  Weight fluctuates (up 5 pounds). PERFORMANCE STATUS (ECOG):  1 HEENT:  No visual changes, runny nose, sore throat, mouth sores or tenderness. Lungs: No shortness of breath or cough.  No hemoptysis. Cardiac:  No chest pain, palpitations, orthopnea, or PND. GI: No nausea, vomiting, diarrhea, constipation, melena or hematochezia. GU:  No urgency, frequency, dysuria, or hematuria. Musculoskeletal:  No back pain.  No joint pain.  No muscle tenderness. Extremities:  No pain or swelling. Skin:  No rashes or skin changes. Neuro:  No headache, numbness or weakness, balance or coordination issues. Endocrine:  Thyroid disease on Synthroid.  No diabetes, hot flashes or night sweats. Psych:  No mood changes, depression or anxiety. Pain:  No focal pain. Review of systems:  All other systems reviewed and found to be negative.  Physical Exam: Blood pressure 127/81, pulse 80, temperature 98.2 F (  36.8 C), temperature source Tympanic, resp. rate 18, weight 97 lb 6 oz (44.2 kg). GENERAL:  Thin woman sitting comfortably in the exam room in no acute distress. MENTAL STATUS:  Alert and oriented to person, place and time. HEAD:  Styled dark blonde hair.  Normocephalic, atraumatic, face symmetric, no Cushingoid features. EYES:  Blue eyes.  Pupils equal round and reactive to light and accomodation.  No conjunctivitis or scleral icterus. ENT:  Bandaide on nose.  Oropharynx clear without lesion.  Torus.  Tongue normal. Mucous membranes moist.  RESPIRATORY:  Clear to auscultation without rales, wheezes or  rhonchi. CARDIOVASCULAR:  Regular rate and rhythm without murmur, rub or gallop. ABDOMEN:  Soft, non-tender, with active bowel sounds, and no hepatosplenomegaly.  No masses. SKIN:  Left posterior shoulder well-healed status post old melanoma removal.  No rashes, ulcers or lesions. EXTREMITIES: No edema, no skin discoloration or tenderness.  No palpable cords. LYMPH NODES: No palpable cervical, supraclavicular, axillary or inguinal adenopathy  NEUROLOGICAL: Unremarkable. PSYCH:  Appropriate.   Orders Only on 02/04/2017  Component Date Value Ref Range Status  . WBC 02/04/2017 6.2  3.6 - 11.0 K/uL Final  . RBC 02/04/2017 5.01  3.80 - 5.20 MIL/uL Final  . Hemoglobin 02/04/2017 15.6  12.0 - 16.0 g/dL Final  . HCT 02/04/2017 46.1  35.0 - 47.0 % Final  . MCV 02/04/2017 92.2  80.0 - 100.0 fL Final  . MCH 02/04/2017 31.2  26.0 - 34.0 pg Final  . MCHC 02/04/2017 33.9  32.0 - 36.0 g/dL Final  . RDW 02/04/2017 13.9  11.5 - 14.5 % Final  . Platelets 02/04/2017 212  150 - 440 K/uL Final  . Neutrophils Relative % 02/04/2017 52  % Final  . Neutro Abs 02/04/2017 3.3  1.4 - 6.5 K/uL Final  . Lymphocytes Relative 02/04/2017 34  % Final  . Lymphs Abs 02/04/2017 2.1  1.0 - 3.6 K/uL Final  . Monocytes Relative 02/04/2017 11  % Final  . Monocytes Absolute 02/04/2017 0.7  0.2 - 0.9 K/uL Final  . Eosinophils Relative 02/04/2017 2  % Final  . Eosinophils Absolute 02/04/2017 0.1  0 - 0.7 K/uL Final  . Basophils Relative 02/04/2017 1  % Final  . Basophils Absolute 02/04/2017 0.0  0 - 0.1 K/uL Final    Assessment:  Sabrina Zamora is a 83 y.o. female with secondary polycythemia first noted in 2013.  She has known sleep apnea (wears oxygen at night but no CPAP mask) and asthma.  She has never smoked.  She is unaware of any cardiac issues.  Work-up on 01/11/2012 revealed a negative JAK2 V617F and exon 12 mutation.  She had a mildy elevated erythropoietin level (46.9) with repeat normal (4.4) on 05/06/2015.   Initial carboxyhemoglobin was 4.6% (high) then 2.3% (normal) on 05/06/2015.   WBC and platelet count have been normal.  Abdominal ultrasound was normal.  She has undergone phlebotomy x 3 to keep hematocrit < 48 then later to keep < 50.  She underwent phlebotomy of 200 cc on 01/11/2012 for a hematocrit of 48.1, 300 cc on 03/30/2012 for a hematocrit of 51.2, and 200 cc on 05/07/2015 for a hematocrit of 51.2.  Ferritin was 34 and iron saturation of 32% on 05/06/2015  She has a history of melanoma x 3.  She had a T1a melanoma in 02/2011.  She has a history of high potassium and calcium.  She is off oral calcium.  She avoids foods high in potassium.  Symptomatically, she  feels good.  Exam is unremarkable.  Plan: 1.  Labs today:  CBC with diff. 2.  Review interval labs and today.  She has not required a phlebotomy since 04/2015.  I suspect she will do well.  I discussed follow-up with Dr. Humphrey Rolls.  The patient requested ongoing blood checks in clinic every 6 months. 3.  RTC every 6 months for CBC +/- phlebotomy. 4.  RTC in 2 year for MD assessment, labs (CBC with diff) +/- phlebotomy.   Lequita Asal, MD  02/04/2017, 2:40 PM

## 2017-02-04 NOTE — Progress Notes (Signed)
Patient offers no concerns today. 

## 2017-02-05 ENCOUNTER — Encounter: Payer: Self-pay | Admitting: Hematology and Oncology

## 2017-02-16 DIAGNOSIS — Z681 Body mass index (BMI) 19 or less, adult: Secondary | ICD-10-CM | POA: Diagnosis not present

## 2017-02-16 DIAGNOSIS — C4359 Malignant melanoma of other part of trunk: Secondary | ICD-10-CM | POA: Diagnosis not present

## 2017-03-02 DIAGNOSIS — I10 Essential (primary) hypertension: Secondary | ICD-10-CM | POA: Diagnosis not present

## 2017-03-02 DIAGNOSIS — N390 Urinary tract infection, site not specified: Secondary | ICD-10-CM | POA: Diagnosis not present

## 2017-03-11 DIAGNOSIS — N390 Urinary tract infection, site not specified: Secondary | ICD-10-CM | POA: Diagnosis not present

## 2017-03-11 DIAGNOSIS — Z85828 Personal history of other malignant neoplasm of skin: Secondary | ICD-10-CM | POA: Diagnosis not present

## 2017-03-11 DIAGNOSIS — L821 Other seborrheic keratosis: Secondary | ICD-10-CM | POA: Diagnosis not present

## 2017-03-11 DIAGNOSIS — I10 Essential (primary) hypertension: Secondary | ICD-10-CM | POA: Diagnosis not present

## 2017-03-11 DIAGNOSIS — D225 Melanocytic nevi of trunk: Secondary | ICD-10-CM | POA: Diagnosis not present

## 2017-03-11 DIAGNOSIS — Z85820 Personal history of malignant melanoma of skin: Secondary | ICD-10-CM | POA: Diagnosis not present

## 2017-03-15 DIAGNOSIS — N390 Urinary tract infection, site not specified: Secondary | ICD-10-CM | POA: Diagnosis not present

## 2017-03-22 DIAGNOSIS — B373 Candidiasis of vulva and vagina: Secondary | ICD-10-CM | POA: Diagnosis not present

## 2017-03-22 DIAGNOSIS — I10 Essential (primary) hypertension: Secondary | ICD-10-CM | POA: Diagnosis not present

## 2017-03-22 DIAGNOSIS — N390 Urinary tract infection, site not specified: Secondary | ICD-10-CM | POA: Diagnosis not present

## 2017-04-05 DIAGNOSIS — H52223 Regular astigmatism, bilateral: Secondary | ICD-10-CM | POA: Diagnosis not present

## 2017-04-05 DIAGNOSIS — D3140 Benign neoplasm of unspecified ciliary body: Secondary | ICD-10-CM | POA: Diagnosis not present

## 2017-04-05 DIAGNOSIS — H5203 Hypermetropia, bilateral: Secondary | ICD-10-CM | POA: Diagnosis not present

## 2017-04-05 DIAGNOSIS — H2513 Age-related nuclear cataract, bilateral: Secondary | ICD-10-CM | POA: Diagnosis not present

## 2017-05-02 DIAGNOSIS — I714 Abdominal aortic aneurysm, without rupture: Secondary | ICD-10-CM | POA: Diagnosis not present

## 2017-05-02 DIAGNOSIS — Z23 Encounter for immunization: Secondary | ICD-10-CM | POA: Diagnosis not present

## 2017-05-10 DIAGNOSIS — N390 Urinary tract infection, site not specified: Secondary | ICD-10-CM | POA: Diagnosis not present

## 2017-05-10 DIAGNOSIS — D751 Secondary polycythemia: Secondary | ICD-10-CM | POA: Diagnosis not present

## 2017-05-10 DIAGNOSIS — I10 Essential (primary) hypertension: Secondary | ICD-10-CM | POA: Diagnosis not present

## 2017-05-10 DIAGNOSIS — Z0001 Encounter for general adult medical examination with abnormal findings: Secondary | ICD-10-CM | POA: Diagnosis not present

## 2017-05-10 DIAGNOSIS — K649 Unspecified hemorrhoids: Secondary | ICD-10-CM | POA: Diagnosis not present

## 2017-05-10 DIAGNOSIS — M810 Age-related osteoporosis without current pathological fracture: Secondary | ICD-10-CM | POA: Diagnosis not present

## 2017-05-11 DIAGNOSIS — E782 Mixed hyperlipidemia: Secondary | ICD-10-CM | POA: Diagnosis not present

## 2017-05-11 DIAGNOSIS — Z0001 Encounter for general adult medical examination with abnormal findings: Secondary | ICD-10-CM | POA: Diagnosis not present

## 2017-05-11 DIAGNOSIS — I10 Essential (primary) hypertension: Secondary | ICD-10-CM | POA: Diagnosis not present

## 2017-05-11 DIAGNOSIS — E039 Hypothyroidism, unspecified: Secondary | ICD-10-CM | POA: Diagnosis not present

## 2017-05-11 DIAGNOSIS — E559 Vitamin D deficiency, unspecified: Secondary | ICD-10-CM | POA: Diagnosis not present

## 2017-05-20 DIAGNOSIS — N390 Urinary tract infection, site not specified: Secondary | ICD-10-CM | POA: Diagnosis not present

## 2017-05-23 DIAGNOSIS — N390 Urinary tract infection, site not specified: Secondary | ICD-10-CM | POA: Diagnosis not present

## 2017-05-25 DIAGNOSIS — N390 Urinary tract infection, site not specified: Secondary | ICD-10-CM | POA: Diagnosis not present

## 2017-05-30 DIAGNOSIS — I10 Essential (primary) hypertension: Secondary | ICD-10-CM | POA: Diagnosis not present

## 2017-05-30 DIAGNOSIS — N390 Urinary tract infection, site not specified: Secondary | ICD-10-CM | POA: Diagnosis not present

## 2017-05-30 DIAGNOSIS — M810 Age-related osteoporosis without current pathological fracture: Secondary | ICD-10-CM | POA: Diagnosis not present

## 2017-05-30 DIAGNOSIS — D751 Secondary polycythemia: Secondary | ICD-10-CM | POA: Diagnosis not present

## 2017-06-08 DIAGNOSIS — D225 Melanocytic nevi of trunk: Secondary | ICD-10-CM | POA: Diagnosis not present

## 2017-06-08 DIAGNOSIS — L821 Other seborrheic keratosis: Secondary | ICD-10-CM | POA: Diagnosis not present

## 2017-06-08 DIAGNOSIS — Z85828 Personal history of other malignant neoplasm of skin: Secondary | ICD-10-CM | POA: Diagnosis not present

## 2017-06-08 DIAGNOSIS — Z85820 Personal history of malignant melanoma of skin: Secondary | ICD-10-CM | POA: Diagnosis not present

## 2017-06-08 DIAGNOSIS — L814 Other melanin hyperpigmentation: Secondary | ICD-10-CM | POA: Diagnosis not present

## 2017-06-08 DIAGNOSIS — L905 Scar conditions and fibrosis of skin: Secondary | ICD-10-CM | POA: Diagnosis not present

## 2017-06-08 DIAGNOSIS — D485 Neoplasm of uncertain behavior of skin: Secondary | ICD-10-CM | POA: Diagnosis not present

## 2017-06-10 DIAGNOSIS — I10 Essential (primary) hypertension: Secondary | ICD-10-CM | POA: Diagnosis not present

## 2017-06-10 DIAGNOSIS — J019 Acute sinusitis, unspecified: Secondary | ICD-10-CM | POA: Diagnosis not present

## 2017-06-10 DIAGNOSIS — R05 Cough: Secondary | ICD-10-CM | POA: Diagnosis not present

## 2017-07-20 DIAGNOSIS — M810 Age-related osteoporosis without current pathological fracture: Secondary | ICD-10-CM | POA: Diagnosis not present

## 2017-07-21 ENCOUNTER — Other Ambulatory Visit: Payer: Self-pay | Admitting: Nurse Practitioner

## 2017-07-21 DIAGNOSIS — Z1231 Encounter for screening mammogram for malignant neoplasm of breast: Secondary | ICD-10-CM

## 2017-08-08 ENCOUNTER — Inpatient Hospital Stay: Payer: Medicare Other

## 2017-08-08 ENCOUNTER — Other Ambulatory Visit: Payer: Self-pay | Admitting: Hematology and Oncology

## 2017-08-08 ENCOUNTER — Inpatient Hospital Stay: Payer: Medicare Other | Attending: Hematology and Oncology

## 2017-08-08 VITALS — BP 136/84 | HR 69 | Temp 96.9°F | Resp 18

## 2017-08-08 DIAGNOSIS — D751 Secondary polycythemia: Secondary | ICD-10-CM | POA: Diagnosis not present

## 2017-08-08 DIAGNOSIS — Z79899 Other long term (current) drug therapy: Secondary | ICD-10-CM | POA: Diagnosis not present

## 2017-08-08 DIAGNOSIS — Z9981 Dependence on supplemental oxygen: Secondary | ICD-10-CM | POA: Insufficient documentation

## 2017-08-08 DIAGNOSIS — Z7982 Long term (current) use of aspirin: Secondary | ICD-10-CM | POA: Insufficient documentation

## 2017-08-08 DIAGNOSIS — J45909 Unspecified asthma, uncomplicated: Secondary | ICD-10-CM | POA: Diagnosis not present

## 2017-08-08 DIAGNOSIS — Z85820 Personal history of malignant melanoma of skin: Secondary | ICD-10-CM | POA: Insufficient documentation

## 2017-08-08 LAB — CBC WITH DIFFERENTIAL/PLATELET
Basophils Absolute: 0.1 10*3/uL (ref 0–0.1)
Basophils Relative: 1 %
Eosinophils Absolute: 0.1 10*3/uL (ref 0–0.7)
Eosinophils Relative: 1 %
HCT: 50.5 % — ABNORMAL HIGH (ref 35.0–47.0)
Hemoglobin: 16.7 g/dL — ABNORMAL HIGH (ref 12.0–16.0)
Lymphocytes Relative: 39 %
Lymphs Abs: 3 10*3/uL (ref 1.0–3.6)
MCH: 31.3 pg (ref 26.0–34.0)
MCHC: 33.1 g/dL (ref 32.0–36.0)
MCV: 94.6 fL (ref 80.0–100.0)
Monocytes Absolute: 0.8 10*3/uL (ref 0.2–0.9)
Monocytes Relative: 10 %
Neutro Abs: 3.8 10*3/uL (ref 1.4–6.5)
Neutrophils Relative %: 49 %
Platelets: 240 10*3/uL (ref 150–440)
RBC: 5.34 MIL/uL — ABNORMAL HIGH (ref 3.80–5.20)
RDW: 13.7 % (ref 11.5–14.5)
WBC: 7.7 10*3/uL (ref 3.6–11.0)

## 2017-08-24 DIAGNOSIS — C4359 Malignant melanoma of other part of trunk: Secondary | ICD-10-CM | POA: Diagnosis not present

## 2017-08-24 DIAGNOSIS — Z681 Body mass index (BMI) 19 or less, adult: Secondary | ICD-10-CM | POA: Diagnosis not present

## 2017-08-29 DIAGNOSIS — H2513 Age-related nuclear cataract, bilateral: Secondary | ICD-10-CM | POA: Diagnosis not present

## 2017-09-09 ENCOUNTER — Ambulatory Visit
Admission: RE | Admit: 2017-09-09 | Discharge: 2017-09-09 | Disposition: A | Discharge status: Home / Self Care | Payer: Medicare Other | Source: Ambulatory Visit | Attending: Nurse Practitioner | Admitting: Nurse Practitioner

## 2017-09-09 DIAGNOSIS — N6310 Unspecified lump in the right breast, unspecified quadrant: Secondary | ICD-10-CM | POA: Diagnosis not present

## 2017-09-09 DIAGNOSIS — Z1231 Encounter for screening mammogram for malignant neoplasm of breast: Secondary | ICD-10-CM

## 2017-09-12 ENCOUNTER — Other Ambulatory Visit: Payer: Self-pay | Admitting: Nurse Practitioner

## 2017-09-12 ENCOUNTER — Other Ambulatory Visit: Payer: Self-pay

## 2017-09-12 DIAGNOSIS — N6310 Unspecified lump in the right breast, unspecified quadrant: Secondary | ICD-10-CM

## 2017-09-12 DIAGNOSIS — R928 Other abnormal and inconclusive findings on diagnostic imaging of breast: Secondary | ICD-10-CM

## 2017-09-12 MED ORDER — MUPIROCIN CALCIUM 2 % NA OINT: TOPICAL_OINTMENT | NASAL | 1 refills | Status: DC

## 2017-09-13 DIAGNOSIS — H2512 Age-related nuclear cataract, left eye: Secondary | ICD-10-CM | POA: Diagnosis not present

## 2017-09-16 ENCOUNTER — Ambulatory Visit
Admission: RE | Admit: 2017-09-16 | Discharge: 2017-09-16 | Disposition: A | Discharge status: Home / Self Care | Payer: Medicare Other | Source: Ambulatory Visit | Attending: Nurse Practitioner | Admitting: Nurse Practitioner

## 2017-09-16 DIAGNOSIS — N6310 Unspecified lump in the right breast, unspecified quadrant: Secondary | ICD-10-CM

## 2017-09-16 DIAGNOSIS — R928 Other abnormal and inconclusive findings on diagnostic imaging of breast: Secondary | ICD-10-CM

## 2017-09-16 DIAGNOSIS — R922 Inconclusive mammogram: Secondary | ICD-10-CM | POA: Diagnosis not present

## 2017-09-16 DIAGNOSIS — N6311 Unspecified lump in the right breast, upper outer quadrant: Secondary | ICD-10-CM | POA: Diagnosis not present

## 2017-09-16 DIAGNOSIS — N6312 Unspecified lump in the right breast, upper inner quadrant: Secondary | ICD-10-CM | POA: Diagnosis not present

## 2017-09-17 HISTORY — PX: BREAST BIOPSY: SHX20

## 2017-09-19 ENCOUNTER — Ambulatory Visit: Payer: Medicare Other

## 2017-09-19 ENCOUNTER — Other Ambulatory Visit: Payer: Medicare Other

## 2017-09-19 ENCOUNTER — Other Ambulatory Visit: Payer: Self-pay | Admitting: Nurse Practitioner

## 2017-09-19 DIAGNOSIS — R928 Other abnormal and inconclusive findings on diagnostic imaging of breast: Secondary | ICD-10-CM

## 2017-09-19 DIAGNOSIS — N6310 Unspecified lump in the right breast, unspecified quadrant: Secondary | ICD-10-CM

## 2017-09-19 NOTE — Discharge Instructions (Signed)

## 2017-09-20 ENCOUNTER — Ambulatory Visit
Admission: RE | Admit: 2017-09-20 | Discharge: 2017-09-20 | Disposition: A | Discharge status: Home / Self Care | Payer: Medicare Other | Source: Ambulatory Visit | Attending: Ophthalmology | Admitting: Ophthalmology

## 2017-09-20 ENCOUNTER — Encounter
Admission: RE | Disposition: A | Discharge status: Home / Self Care | Payer: Self-pay | Source: Ambulatory Visit | Attending: Ophthalmology

## 2017-09-20 ENCOUNTER — Ambulatory Visit: Payer: Medicare Other | Admitting: Anesthesiology

## 2017-09-20 DIAGNOSIS — E7800 Pure hypercholesterolemia, unspecified: Secondary | ICD-10-CM | POA: Diagnosis not present

## 2017-09-20 DIAGNOSIS — Z79899 Other long term (current) drug therapy: Secondary | ICD-10-CM | POA: Diagnosis not present

## 2017-09-20 DIAGNOSIS — I10 Essential (primary) hypertension: Secondary | ICD-10-CM | POA: Diagnosis not present

## 2017-09-20 DIAGNOSIS — E039 Hypothyroidism, unspecified: Secondary | ICD-10-CM | POA: Insufficient documentation

## 2017-09-20 DIAGNOSIS — Z881 Allergy status to other antibiotic agents status: Secondary | ICD-10-CM | POA: Diagnosis not present

## 2017-09-20 DIAGNOSIS — Z85828 Personal history of other malignant neoplasm of skin: Secondary | ICD-10-CM | POA: Insufficient documentation

## 2017-09-20 DIAGNOSIS — Z7982 Long term (current) use of aspirin: Secondary | ICD-10-CM | POA: Insufficient documentation

## 2017-09-20 DIAGNOSIS — Z882 Allergy status to sulfonamides status: Secondary | ICD-10-CM | POA: Insufficient documentation

## 2017-09-20 DIAGNOSIS — M810 Age-related osteoporosis without current pathological fracture: Secondary | ICD-10-CM | POA: Diagnosis not present

## 2017-09-20 DIAGNOSIS — Z880 Allergy status to penicillin: Secondary | ICD-10-CM | POA: Diagnosis not present

## 2017-09-20 DIAGNOSIS — J45909 Unspecified asthma, uncomplicated: Secondary | ICD-10-CM | POA: Insufficient documentation

## 2017-09-20 DIAGNOSIS — H2512 Age-related nuclear cataract, left eye: Secondary | ICD-10-CM | POA: Diagnosis not present

## 2017-09-20 HISTORY — PX: CATARACT EXTRACTION W/PHACO: SHX586

## 2017-09-20 HISTORY — DX: Unspecified asthma, uncomplicated: J45.909

## 2017-09-20 HISTORY — DX: Hypothyroidism, unspecified: E03.9

## 2017-09-20 SURGERY — PHACOEMULSIFICATION, CATARACT, WITH IOL INSERTION
Anesthesia: Monitor Anesthesia Care | Site: Eye | Laterality: Left | Wound class: Clean

## 2017-09-20 MED ORDER — FENTANYL CITRATE (PF) 100 MCG/2ML IJ SOLN
INTRAMUSCULAR | Status: DC | PRN
  Administered 2017-09-20: 50 ug via INTRAVENOUS

## 2017-09-20 MED ORDER — MIDAZOLAM HCL 2 MG/2ML IJ SOLN
INTRAMUSCULAR | Status: DC | PRN
  Administered 2017-09-20: 1 mg via INTRAVENOUS

## 2017-09-20 MED ORDER — NA HYALUR & NA CHOND-NA HYALUR 0.4-0.35 ML IO KIT
PACK | INTRAOCULAR | Status: DC | PRN
  Administered 2017-09-20: 1 mL via INTRAOCULAR

## 2017-09-20 MED ORDER — FENTANYL CITRATE (PF) 100 MCG/2ML IJ SOLN: 25.0000 ug | INTRAMUSCULAR | Status: DC | PRN

## 2017-09-20 MED ORDER — LIDOCAINE HCL (PF) 2 % IJ SOLN
INTRAOCULAR | Status: DC | PRN
  Administered 2017-09-20: 1 mL

## 2017-09-20 MED ORDER — LACTATED RINGERS IV SOLN: INTRAVENOUS | Status: DC

## 2017-09-20 MED ORDER — OXYCODONE HCL 5 MG/5ML PO SOLN: 5.0000 mg | Freq: Once | ORAL | Status: DC | PRN

## 2017-09-20 MED ORDER — ARMC OPHTHALMIC DILATING DROPS
1.0000 "application " | OPHTHALMIC | Status: DC | PRN
  Administered 2017-09-20 (×3): 1 via OPHTHALMIC

## 2017-09-20 MED ORDER — OXYCODONE HCL 5 MG PO TABS: 5.0000 mg | ORAL_TABLET | Freq: Once | ORAL | Status: DC | PRN

## 2017-09-20 MED ORDER — POLYMYXIN B-TRIMETHOPRIM 10000-0.1 UNIT/ML-% OP SOLN: 1.0000 [drp] | OPHTHALMIC | Status: DC

## 2017-09-20 MED ORDER — EPINEPHRINE PF 1 MG/ML IJ SOLN
INTRAMUSCULAR | Status: DC | PRN
  Administered 2017-09-20: 91 mL via OPHTHALMIC

## 2017-09-20 MED ORDER — BRIMONIDINE TARTRATE-TIMOLOL 0.2-0.5 % OP SOLN
OPHTHALMIC | Status: DC | PRN
  Administered 2017-09-20: 1 [drp] via OPHTHALMIC

## 2017-09-20 MED ORDER — POLYMYXIN B-TRIMETHOPRIM 10000-0.1 UNIT/ML-% OP SOLN
1.0000 [drp] | OPHTHALMIC | Status: AC
  Administered 2017-09-20 (×3): 1 [drp] via OPHTHALMIC

## 2017-09-20 MED ORDER — CEFUROXIME OPHTHALMIC INJECTION 1 MG/0.1 ML
INJECTION | OPHTHALMIC | Status: DC | PRN
  Administered 2017-09-20: 0.1 mL via INTRACAMERAL

## 2017-09-20 SURGICAL SUPPLY — 25 items

## 2017-09-20 NOTE — Op Note (Signed)
OPERATIVE NOTE  Sabrina Zamora [id number] 09/20/2017   PREOPERATIVE DIAGNOSIS:  Nuclear sclerotic cataract left eye. H25.12   POSTOPERATIVE DIAGNOSIS:    Nuclear sclerotic cataract left eye.     PROCEDURE:  Phacoemusification with posterior chamber intraocular lens placement of the left eye   LENS:   Implant Name Type Inv. Item Serial No. Manufacturer Lot No. LRB No. Used  LENS IOL DIOP 23.0 - [id number] Intraocular Lens LENS IOL DIOP 23.0 [id number] AMO  Left 1        ULTRASOUND TIME: 15  % of 2 minutes 0 seconds, CDE 17.7  SURGEON:  Wyonia Hough, MD   ANESTHESIA:  Topical with tetracaine drops and 2% Xylocaine jelly, augmented with 1% preservative-free intracameral lidocaine.    COMPLICATIONS:  None.   DESCRIPTION OF PROCEDURE:  The patient was identified in the holding room and transported to the operating room and placed in the supine position under the operating microscope.  The left eye was identified as the operative eye and it was prepped and draped in the usual sterile ophthalmic fashion.   A 1 millimeter clear-corneal paracentesis was made at the 1:30 position.  0.5 ml of preservative-free 1% lidocaine was injected into the anterior chamber.  The anterior chamber was filled with Viscoat viscoelastic.  A 2.4 millimeter keratome was used to make a near-clear corneal incision at the 10:30 position.  .  A curvilinear capsulorrhexis was made with a cystotome and capsulorrhexis forceps.  Balanced salt solution was used to hydrodissect and hydrodelineate the nucleus.   Phacoemulsification was then used in stop and chop fashion to remove the lens nucleus and epinucleus.  The remaining cortex was then removed using the irrigation and aspiration handpiece. Provisc was then placed into the capsular bag to distend it for lens placement.  A lens was then injected into the capsular bag.  The remaining viscoelastic was aspirated.   Wounds were hydrated with balanced salt  solution.  The anterior chamber was inflated to a physiologic pressure with balanced salt solution.  No wound leaks were noted. Cefuroxime 0.1 ml of a 10mg /ml solution was injected into the anterior chamber for a dose of 1 mg of intracameral antibiotic at the completion of the case.   Timolol and Brimonidine drops were applied to the eye.  The patient was taken to the recovery room in stable condition without complications of anesthesia or surgery.  Sabrina Zamora 09/20/2017, 11:13 AM

## 2017-09-20 NOTE — Anesthesia Preprocedure Evaluation (Signed)
Anesthesia Evaluation    Airway Mallampati: II  TM Distance: >3 FB Neck ROM: Full    Dental no notable dental hx.    Pulmonary asthma ,    Pulmonary exam normal breath sounds clear to auscultation       Cardiovascular negative cardio ROS Normal cardiovascular exam Rhythm:Regular Rate:Normal     Neuro/Psych    GI/Hepatic negative GI ROS, Neg liver ROS,   Endo/Other  Hypothyroidism   Renal/GU negative Renal ROS     Musculoskeletal osteoporosis   Abdominal   Peds  Hematology   Anesthesia Other Findings   Reproductive/Obstetrics                             Anesthesia Physical Anesthesia Plan  ASA: II  Anesthesia Plan: MAC   Post-op Pain Management:    Induction: Intravenous  PONV Risk Score and Plan: TIVA  Airway Management Planned: Natural Airway  Additional Equipment:   Intra-op Plan:   Post-operative Plan: Extubation in OR  Informed Consent: I have reviewed the patients History and Physical, chart, labs and discussed the procedure including the risks, benefits and alternatives for the proposed anesthesia with the patient or authorized representative who has indicated his/her understanding and acceptance.   Dental advisory given  Plan Discussed with: CRNA  Anesthesia Plan Comments:         Anesthesia Quick Evaluation

## 2017-09-20 NOTE — Anesthesia Procedure Notes (Signed)
Procedure Name: MAC Performed by: Sonoma Firkus, CRNA Pre-anesthesia Checklist: Patient identified, Emergency Drugs available, Suction available, Timeout performed and Patient being monitored Patient Re-evaluated:Patient Re-evaluated prior to induction Oxygen Delivery Method: Nasal cannula Placement Confirmation: positive ETCO2       

## 2017-09-20 NOTE — Anesthesia Postprocedure Evaluation (Signed)
Anesthesia Post Note  Patient: Sabrina Zamora  Procedure(s) Performed: CATARACT EXTRACTION PHACO AND INTRAOCULAR LENS PLACEMENT (IOC) LEFT (Left Eye)  Patient location during evaluation: PACU Anesthesia Type: MAC Level of consciousness: awake and alert Pain management: pain level controlled Vital Signs Assessment: post-procedure vital signs reviewed and stable Respiratory status: spontaneous breathing, nonlabored ventilation, respiratory function stable and patient connected to nasal cannula oxygen Cardiovascular status: stable and blood pressure returned to baseline Postop Assessment: no apparent nausea or vomiting Anesthetic complications: no    [name]

## 2017-09-20 NOTE — H&P (Signed)
The History and Physical notes are on paper, have been signed, and are to be scanned. The patient remains stable and unchanged from the H&P.   Previous H&P reviewed, patient examined, and there are no changes.  Sabrina Zamora 09/20/2017 10:08 AM

## 2017-09-20 NOTE — Transfer of Care (Signed)
Immediate Anesthesia Transfer of Care Note  Patient: Sabrina Zamora  Procedure(s) Performed: CATARACT EXTRACTION PHACO AND INTRAOCULAR LENS PLACEMENT (IOC) LEFT (Left Eye)  Patient Location: PACU  Anesthesia Type: MAC  Level of Consciousness: awake, alert  and patient cooperative  Airway and Oxygen Therapy: Patient Spontanous Breathing and Patient connected to supplemental oxygen  Post-op Assessment: Post-op Vital signs reviewed, Patient's Cardiovascular Status Stable, Respiratory Function Stable, Patent Airway and No signs of Nausea or vomiting  Post-op Vital Signs: Reviewed and stable  Complications: No apparent anesthesia complications

## 2017-09-21 ENCOUNTER — Telehealth: Payer: Self-pay

## 2017-09-21 NOTE — Telephone Encounter (Signed)
[facility] faxed an URGENT order that needed to be signed by Leretha Pol for additional imaging.   Heather signed and I faxed the paper. She also signed the order through [place].

## 2017-09-27 ENCOUNTER — Ambulatory Visit
Admission: RE | Admit: 2017-09-27 | Discharge: 2017-09-27 | Disposition: A | Discharge status: Home / Self Care | Payer: Medicare Other | Source: Ambulatory Visit | Attending: Nurse Practitioner | Admitting: Nurse Practitioner

## 2017-09-27 DIAGNOSIS — N6310 Unspecified lump in the right breast, unspecified quadrant: Secondary | ICD-10-CM

## 2017-09-27 DIAGNOSIS — R928 Other abnormal and inconclusive findings on diagnostic imaging of breast: Secondary | ICD-10-CM

## 2017-09-27 DIAGNOSIS — D241 Benign neoplasm of right breast: Secondary | ICD-10-CM | POA: Diagnosis not present

## 2017-09-27 DIAGNOSIS — N6312 Unspecified lump in the right breast, upper inner quadrant: Secondary | ICD-10-CM | POA: Diagnosis not present

## 2017-09-27 DIAGNOSIS — N62 Hypertrophy of breast: Secondary | ICD-10-CM | POA: Diagnosis not present

## 2017-09-27 DIAGNOSIS — N6311 Unspecified lump in the right breast, upper outer quadrant: Secondary | ICD-10-CM | POA: Diagnosis not present

## 2017-09-27 HISTORY — PX: BREAST BIOPSY: SHX20

## 2017-09-28 LAB — SURGICAL PATHOLOGY

## 2017-09-30 DIAGNOSIS — D2261 Melanocytic nevi of right upper limb, including shoulder: Secondary | ICD-10-CM | POA: Diagnosis not present

## 2017-09-30 DIAGNOSIS — D225 Melanocytic nevi of trunk: Secondary | ICD-10-CM | POA: Diagnosis not present

## 2017-09-30 DIAGNOSIS — Z85820 Personal history of malignant melanoma of skin: Secondary | ICD-10-CM | POA: Diagnosis not present

## 2017-09-30 DIAGNOSIS — Z85828 Personal history of other malignant neoplasm of skin: Secondary | ICD-10-CM | POA: Diagnosis not present

## 2017-10-10 ENCOUNTER — Other Ambulatory Visit: Payer: Self-pay | Admitting: Internal Medicine

## 2017-10-31 ENCOUNTER — Encounter (INDEPENDENT_AMBULATORY_CARE_PROVIDER_SITE_OTHER): Payer: Self-pay

## 2017-10-31 ENCOUNTER — Ambulatory Visit (INDEPENDENT_AMBULATORY_CARE_PROVIDER_SITE_OTHER): Payer: Medicare Other | Admitting: Nurse Practitioner

## 2017-10-31 ENCOUNTER — Encounter: Payer: Self-pay | Admitting: Nurse Practitioner

## 2017-10-31 VITALS — BP 148/86 | HR 75 | Resp 16 | Ht 62.5 in | Wt 100.0 lb

## 2017-10-31 DIAGNOSIS — R319 Hematuria, unspecified: Secondary | ICD-10-CM

## 2017-10-31 DIAGNOSIS — N390 Urinary tract infection, site not specified: Secondary | ICD-10-CM | POA: Diagnosis not present

## 2017-10-31 DIAGNOSIS — N6019 Diffuse cystic mastopathy of unspecified breast: Secondary | ICD-10-CM | POA: Insufficient documentation

## 2017-10-31 DIAGNOSIS — F419 Anxiety disorder, unspecified: Secondary | ICD-10-CM | POA: Insufficient documentation

## 2017-10-31 DIAGNOSIS — E039 Hypothyroidism, unspecified: Secondary | ICD-10-CM | POA: Diagnosis not present

## 2017-10-31 DIAGNOSIS — R300 Dysuria: Secondary | ICD-10-CM

## 2017-10-31 DIAGNOSIS — G4730 Sleep apnea, unspecified: Secondary | ICD-10-CM | POA: Insufficient documentation

## 2017-10-31 DIAGNOSIS — I10 Essential (primary) hypertension: Secondary | ICD-10-CM

## 2017-10-31 DIAGNOSIS — G4733 Obstructive sleep apnea (adult) (pediatric): Secondary | ICD-10-CM | POA: Insufficient documentation

## 2017-10-31 DIAGNOSIS — Z85820 Personal history of malignant melanoma of skin: Secondary | ICD-10-CM | POA: Insufficient documentation

## 2017-10-31 DIAGNOSIS — D751 Secondary polycythemia: Secondary | ICD-10-CM

## 2017-10-31 LAB — POCT URINALYSIS DIPSTICK
Bilirubin, UA: NEGATIVE
GLUCOSE UA: NEGATIVE
KETONES UA: NEGATIVE
LEUKOCYTES UA: NEGATIVE
NITRITE UA: NEGATIVE
PROTEIN UA: NEGATIVE
SPEC GRAV UA: 1.01 (ref 1.010–1.025)
Urobilinogen, UA: 0.2 E.U./dL
pH, UA: 7 (ref 5.0–8.0)

## 2017-10-31 NOTE — Progress Notes (Signed)
[facility] [place], [place] [address]  Internal MEDICINE  Office Visit Note  Patient Name: Sabrina Zamora  [id number]  [id number]  Date of Service: 10/31/2017  Chief Complaint  Patient presents with  . Urinary Tract Infection    has been treated several times over past few months for UTI.   Marland Kitchen Hypertension    states that she was in a hurry this morning and feels like this may have resulted in elevated bp.    The patient is here for routine follow up exam. She has elevated blood pressure today. Was in a rush trying tin be here on time. Feels like her BP is generally elevated when she first comes into the office.  Has had recurrent UTIs. Due to allergies to multiple antibiotics, she had to be treated with rocephin IM. Will, intermittently have bladder spasms. Come and go. Currently doing well.  Since her last visit, she did have a mammogram, which resulted in two biopsies of right breast. Both were benign. Results are available in her chart.   Pt is here for routine follow up.    Current Medication: Outpatient Encounter Medications as of 10/31/2017  Medication Sig Note  . amLODipine (NORVASC) 2.5 MG tablet Take 2.5 mg by mouth daily.   Marland Kitchen aspirin EC 81 MG tablet Take 81 mg by mouth. 02/04/2017: Takes every other day.  . Cholecalciferol (VITAMIN D PO) Take by mouth.   . Fluticasone-Salmeterol (ADVAIR) 250-50 MCG/DOSE AEPB Inhale into the lungs. 05/06/2015: Received from: [facility]  . levothyroxine (SYNTHROID, LEVOTHROID) 25 MCG tablet Take 25 mcg by mouth daily.  05/06/2015: Received from: [facility]  . lovastatin (MEVACOR) 20 MG tablet TAKE ONE TABLET BY MOUTH AT BEDTIME   . Multiple Vitamin (MULTI-VITAMINS) TABS Take by mouth. 05/06/2015: Received from: [facility]  . mupirocin nasal ointment (BACTROBAN) 2 % Apply to nasal area at bedtime as needed   . omeprazole (PRILOSEC) 20 MG capsule Take 20 mg by mouth every other day.  05/06/2015:  Received from: [facility]  . docusate sodium (COLACE) 100 MG capsule Take 100 mg by mouth. 05/06/2015: Received from: [facility]  . [DISCONTINUED] lisinopril (PRINIVIL,ZESTRIL) 10 MG tablet Take 10 mg by mouth. 05/06/2015: Received from: [facility]   No facility-administered encounter medications on file as of 10/31/2017.     Surgical History: Past Surgical History:  Procedure Laterality Date  . BREAST BIOPSY Left 2003   neg  . BREAST BIOPSY Right 09/27/2017   US biopsy of 2 areas  . BREAST EXCISIONAL BIOPSY Left 1988   excisional - neg  . CATARACT EXTRACTION W/PHACO Left 09/20/2017   Procedure: CATARACT EXTRACTION PHACO AND INTRAOCULAR LENS PLACEMENT ([place]) LEFT;  Surgeon: Leandrew Koyanagi, MD;  Location: [facility];  Service: Ophthalmology;  Laterality: Left;  . CHOLECYSTECTOMY      Medical History: Past Medical History:  Diagnosis Date  . Asthma   . Hypothyroidism   . Osteoporosis   . Skin cancer 2012, 2016    Family History: Family History  Problem Relation Age of Onset  . Clotting disorder Sister   . Breast cancer Sister 55    Social History   Socioeconomic History  . Marital status: Married    Spouse name: Not on file  . Number of children: Not on file  . Years of education: Not on file  . Highest education level: Not on file  Occupational History  . Not on file  Social Needs  . Financial resource strain: Not on file  . Food insecurity:    Worry: Not on file    Inability: Not on file  . Transportation needs:    Medical: Not on file    Non-medical: Not on file  Tobacco Use  . Smoking status: Never Smoker  . Smokeless tobacco: Never Used  Substance and Sexual Activity  . Alcohol use: Never    Alcohol/week: 0.0 oz    Frequency: Never  . Drug use: Never  . Sexual activity: Not on file  Lifestyle  . Physical activity:    Days per week: Not on file    Minutes per session: Not on file  . Stress: Not on file  Relationships   . Social connections:    Talks on phone: Not on file    Gets together: Not on file    Attends religious service: Not on file    Active member of club or organization: Not on file    Attends meetings of clubs or organizations: Not on file    Relationship status: Not on file  . Intimate partner violence:    Fear of current or ex partner: Not on file    Emotionally abused: Not on file    Physically abused: Not on file    Forced sexual activity: Not on file  Other Topics Concern  . Not on file  Social History Narrative  . Not on file      Review of Systems  Constitutional: Negative for activity change, chills, fatigue and unexpected weight change.  HENT: Negative for congestion, postnasal drip, rhinorrhea, sneezing and sore throat.   Eyes: Negative.  Negative for redness.  Respiratory: Negative for cough, chest tightness and shortness of breath.   Cardiovascular: Negative for chest pain and palpitations.  Gastrointestinal: Negative for abdominal pain, constipation, diarrhea, nausea and vomiting.  Endocrine: Negative for cold intolerance, heat intolerance, polydipsia, polyphagia and polyuria.  Genitourinary: Negative for dysuria and frequency.  Musculoskeletal: Negative for arthralgias, back pain, joint swelling and neck pain.  Skin: Negative for rash.  Allergic/Immunologic: Negative for environmental allergies.  Neurological: Negative for dizziness, tremors, numbness and headaches.  Hematological: Negative for adenopathy. Does not bruise/bleed easily.  Psychiatric/Behavioral: Negative for behavioral problems (Depression), dysphoric mood, sleep disturbance and suicidal ideas. The patient is not nervous/anxious.     Today's Vitals   10/31/17 0903 10/31/17 0942  BP: (!) 163/95 (!) 148/86  Pulse: 75   Resp: 16   SpO2: 99%   Weight: 100 lb (45.4 kg)   Height: 5' 2.5" (1.588 m)     Physical Exam  Constitutional: She is oriented to person, place, and time. She appears  well-developed and well-nourished. No distress.  HENT:  Head: Normocephalic and atraumatic.  Mouth/Throat: Oropharynx is clear and moist. No oropharyngeal exudate.  Eyes: Pupils are equal, round, and reactive to light. Conjunctivae and EOM are normal.  Neck: Normal range of motion. Neck supple. No JVD present. Carotid bruit is not present. No tracheal deviation present. No thyromegaly present.  Cardiovascular: Normal rate, regular rhythm and normal heart sounds. Exam reveals no gallop and no friction rub.  No murmur heard. Pulmonary/Chest: Effort normal and breath sounds normal. No respiratory distress. She has no rales. She exhibits no tenderness.  Abdominal: Soft. Bowel sounds are normal. There is no tenderness.  Genitourinary:  Genitourinary Comments: Urine sample positive for trace blood only.   Musculoskeletal: Normal range of motion.  Lymphadenopathy:    She has no cervical  adenopathy.  Neurological: She is alert and oriented to person, place, and time. No cranial nerve deficit.  Skin: Skin is warm and dry. She is not diaphoretic.  Psychiatric: She has a normal mood and affect. Her behavior is normal. Judgment and thought content normal.  Nursing note and vitals reviewed.   Assessment/Plan: 1. Essential hypertension, benign Generally stable. Continue bp medication as prescribed.   2. Urinary tract infection with hematuria, site unspecified U/a positive fortrace blood only. Will send for culture and sensitivity and treat as indicated.  - CULTURE, URINE COMPREHENSIVE  3. Dysuria - POCT Urinalysis Dipstick - positive for trace blood only.   4. Acquired hypothyroidism Stable. Continue levothyroxine as prescribed.   5. Erythrocytosis Labs stable. Continue to monitor.   General Counseling: Madalin verbalizes understanding of the findings of todays visit and agrees with plan of treatment. I have discussed any further diagnostic evaluation that may be needed or ordered today. We  also reviewed her medications today. she has been encouraged to call the office with any questions or concerns that should arise related to todays visit.  Hypertension Counseling:   The following hypertensive lifestyle modification were recommended and discussed:  1. Limiting alcohol intake to less than 1 oz/day of ethanol:(24 oz of beer or 8 oz of wine or 2 oz of 100-proof whiskey). 2. Take baby ASA 81 mg daily. 3. Importance of regular aerobic exercise and losing weight. 4. Reduce dietary saturated fat and cholesterol intake for overall cardiovascular health. 5. Maintaining adequate dietary potassium, calcium, and magnesium intake. 6. Regular monitoring of the blood pressure. 7. Reduce sodium intake to less than 100 mmol/day (less than 2.3 gm of sodium or less than 6 gm of sodium choride)   This patient was seen by Leretha Pol, FNP- C in Collaboration with Dr Lavera Guise as a part of collaborative care agreement    Orders Placed This Encounter  Procedures  . CULTURE, URINE COMPREHENSIVE  . POCT Urinalysis Dipstick    Time spent: 39 Minutes      Dr Lavera Guise Internal medicine

## 2017-11-03 ENCOUNTER — Other Ambulatory Visit: Payer: Self-pay | Admitting: Nurse Practitioner

## 2017-11-03 ENCOUNTER — Telehealth: Payer: Self-pay | Admitting: Nurse Practitioner

## 2017-11-03 DIAGNOSIS — R319 Hematuria, unspecified: Principal | ICD-10-CM

## 2017-11-03 DIAGNOSIS — N390 Urinary tract infection, site not specified: Secondary | ICD-10-CM

## 2017-11-03 LAB — CULTURE, URINE COMPREHENSIVE

## 2017-11-03 MED ORDER — DOXYCYCLINE HYCLATE 100 MG PO TABS: 100.0000 mg | ORAL_TABLET | Freq: Two times a day (BID) | ORAL | 0 refills | Status: DC

## 2017-11-03 NOTE — Progress Notes (Signed)
Urine culture from recent visit did grow back moderate infection. Sent I prescription for dixycycline 100mg  bid for 10 days. [facility] to [facility].

## 2017-11-03 NOTE — Telephone Encounter (Signed)
-----   Message from Ronnell Freshwater, NP sent at 11/03/2017  8:40 AM EDT ----- Please let the patient know that Urine culture from recent visit did grow back moderate infection. Sent I prescription for dixycycline 100mg  bid for 10 days. Sent to [facility]. thanks

## 2017-11-03 NOTE — Progress Notes (Signed)
Please let the patient know that Urine culture from recent visit did grow back moderate infection. Sent I prescription for dixycycline 100mg  bid for 10 days. Sent to [facility]. thanks

## 2017-11-03 NOTE — Telephone Encounter (Signed)
Spoke with patient regarding urine culture and antibiotic that was sent in to [facility]

## 2017-11-15 ENCOUNTER — Encounter: Payer: Self-pay | Admitting: Internal Medicine

## 2017-11-15 ENCOUNTER — Ambulatory Visit (INDEPENDENT_AMBULATORY_CARE_PROVIDER_SITE_OTHER): Payer: Medicare Other | Admitting: Internal Medicine

## 2017-11-15 DIAGNOSIS — N390 Urinary tract infection, site not specified: Secondary | ICD-10-CM | POA: Diagnosis not present

## 2017-11-15 DIAGNOSIS — I10 Essential (primary) hypertension: Secondary | ICD-10-CM | POA: Diagnosis not present

## 2017-11-15 DIAGNOSIS — R300 Dysuria: Secondary | ICD-10-CM | POA: Diagnosis not present

## 2017-11-15 LAB — POCT URINALYSIS DIPSTICK
Bilirubin, UA: NEGATIVE
Glucose, UA: NEGATIVE
KETONES UA: NEGATIVE
NITRITE UA: NEGATIVE
PH UA: 5 (ref 5.0–8.0)
PROTEIN UA: NEGATIVE
RBC UA: NEGATIVE
Spec Grav, UA: 1.01 (ref 1.010–1.025)
UROBILINOGEN UA: 0.2 U/dL

## 2017-11-15 NOTE — Progress Notes (Signed)
[facility] [place], [place] [address]  Internal MEDICINE  Office Visit Note  Patient Name: Sabrina Zamora  [id number]  [id number]  Date of Service: 11/15/2017  Chief Complaint  Patient presents with  . Urinary Tract Infection     HPI Pt is here for a sick visit. She was treated with Doxy for 10 days. She is here for repeat UA. Frequency of these UTI has been increased. Pt seems upset because she has to take Abx    Current Medication:  Outpatient Encounter Medications as of 11/15/2017  Medication Sig Note  . amLODipine (NORVASC) 2.5 MG tablet Take 2.5 mg by mouth daily.   Marland Kitchen aspirin EC 81 MG tablet Take 81 mg by mouth. 02/04/2017: Takes every other day.  . Cholecalciferol (VITAMIN D PO) Take by mouth.   . docusate sodium (COLACE) 100 MG capsule Take 100 mg by mouth. 05/06/2015: Received from: [facility]  . doxycycline (VIBRA-TABS) 100 MG tablet Take 1 tablet (100 mg total) by mouth 2 (two) times daily.   . Fluticasone-Salmeterol (ADVAIR) 250-50 MCG/DOSE AEPB Inhale into the lungs. 05/06/2015: Received from: [facility]  . levothyroxine (SYNTHROID, LEVOTHROID) 25 MCG tablet Take 25 mcg by mouth daily.  05/06/2015: Received from: [facility]  . lovastatin (MEVACOR) 20 MG tablet TAKE ONE TABLET BY MOUTH AT BEDTIME   . Multiple Vitamin (MULTI-VITAMINS) TABS Take by mouth. 05/06/2015: Received from: [facility]  . mupirocin nasal ointment (BACTROBAN) 2 % Apply to nasal area at bedtime as needed   . omeprazole (PRILOSEC) 20 MG capsule Take 20 mg by mouth every other day.  05/06/2015: Received from: [facility]   No facility-administered encounter medications on file as of 11/15/2017.       Medical History: Past Medical History:  Diagnosis Date  . Asthma   . Hypothyroidism   . Osteoporosis   . Skin cancer 2012, 2016     Vital Signs: BP (!) 142/90   Pulse 71   Temp (!) 97.5 F (36.4 C) (Oral)   Resp 16   Ht 5\' 2"  (1.575 m)    Wt 97 lb 13.9 oz (44.4 kg)   SpO2 99%   BMI 17.90 kg/m    Review of Systems  Constitutional: Negative for chills, diaphoresis and fatigue.  HENT: Negative for ear pain, postnasal drip and sinus pressure.   Eyes: Negative for photophobia, discharge, redness, itching and visual disturbance.  Respiratory: Negative for cough, shortness of breath and wheezing.   Cardiovascular: Negative for chest pain, palpitations and leg swelling.  Gastrointestinal: Negative for abdominal pain, constipation, diarrhea, nausea and vomiting.  Genitourinary: Negative for dysuria and flank pain.  Musculoskeletal: Negative for arthralgias, back pain, gait problem and neck pain.  Skin: Negative for color change.  Allergic/Immunologic: Negative for environmental allergies and food allergies.  Neurological: Negative for dizziness and headaches.  Hematological: Does not bruise/bleed easily.  Psychiatric/Behavioral: Negative for agitation, behavioral problems (depression) and hallucinations.    Physical Exam  Constitutional: She is oriented to person, place, and time. She appears well-developed and well-nourished.  Eyes: Pupils are equal, round, and reactive to light. EOM are normal.  Cardiovascular: Normal rate and regular rhythm.  Abdominal: Soft.  Neurological: She is alert and oriented to person, place, and time.   Assessment/Plan: 1. Dysuria - POCT Urinalysis Dipstick - Ambulatory referral to Urology  2. Urinary tract infection without hematuria, site unspecified - CULTURE, URINE COMPREHENSIVE - Ambulatory referral to Urology  3.  Benign hypertension - Monitor bp at home, pt seems to be anxious   General Counseling: Sabrina Zamora verbalizes understanding of the findings of todays visit and agrees with plan of treatment. I have discussed any further diagnostic evaluation that may be needed or ordered today. We also reviewed her medications today. she has been encouraged to call the office with any questions or  concerns that should arise related to todays visit.   Orders Placed This Encounter  Procedures  . CULTURE, URINE COMPREHENSIVE  . Ambulatory referral to Urology  . POCT Urinalysis Dipstick     Time spent:25 Minutes

## 2017-11-18 LAB — CULTURE, URINE COMPREHENSIVE

## 2017-11-22 ENCOUNTER — Other Ambulatory Visit: Payer: Self-pay | Admitting: Nurse Practitioner

## 2017-11-22 ENCOUNTER — Telehealth: Payer: Self-pay

## 2017-11-22 DIAGNOSIS — R319 Hematuria, unspecified: Principal | ICD-10-CM

## 2017-11-22 DIAGNOSIS — N390 Urinary tract infection, site not specified: Secondary | ICD-10-CM

## 2017-11-22 MED ORDER — CEPHALEXIN 250 MG PO CAPS: 250.0000 mg | ORAL_CAPSULE | Freq: Three times a day (TID) | ORAL | 0 refills | Status: DC

## 2017-11-22 NOTE — Telephone Encounter (Signed)
Previously treated with doxycycline for TUI. Bacteria resistant. Will do trial of cephalexin 250mg  TID for 10 days. [facility] Will have her monitor closely for negative effects due to allergy to penicillin. Has done well with ceftriaxone in the past. Urology appointment as scheduled.

## 2017-11-22 NOTE — Progress Notes (Signed)
Previously treated with doxycycline for TUI. Bacteria resistant. Will do trial of cephalexin 250mg  TID for 10 days. Will have her monitor closely for negative effects due to allergy to penicillin. Has done well with ceftriaxone in the past. Urology appointment as scheduled.

## 2017-12-03 DIAGNOSIS — S62316A Displaced fracture of base of fifth metacarpal bone, right hand, initial encounter for closed fracture: Secondary | ICD-10-CM | POA: Diagnosis not present

## 2017-12-03 DIAGNOSIS — S99921A Unspecified injury of right foot, initial encounter: Secondary | ICD-10-CM | POA: Diagnosis not present

## 2017-12-03 DIAGNOSIS — W010XXA Fall on same level from slipping, tripping and stumbling without subsequent striking against object, initial encounter: Secondary | ICD-10-CM | POA: Diagnosis not present

## 2017-12-03 DIAGNOSIS — S92351A Displaced fracture of fifth metatarsal bone, right foot, initial encounter for closed fracture: Secondary | ICD-10-CM | POA: Diagnosis not present

## 2017-12-06 ENCOUNTER — Encounter: Payer: Self-pay | Admitting: Nurse Practitioner

## 2017-12-06 ENCOUNTER — Ambulatory Visit (INDEPENDENT_AMBULATORY_CARE_PROVIDER_SITE_OTHER): Payer: Medicare Other | Admitting: Nurse Practitioner

## 2017-12-06 VITALS — BP 168/92 | HR 85 | Temp 99.1°F | Resp 16 | Ht 62.5 in | Wt 102.6 lb

## 2017-12-06 DIAGNOSIS — N390 Urinary tract infection, site not specified: Secondary | ICD-10-CM | POA: Diagnosis not present

## 2017-12-06 DIAGNOSIS — R300 Dysuria: Secondary | ICD-10-CM

## 2017-12-06 DIAGNOSIS — E039 Hypothyroidism, unspecified: Secondary | ICD-10-CM | POA: Diagnosis not present

## 2017-12-06 DIAGNOSIS — S92351D Displaced fracture of fifth metatarsal bone, right foot, subsequent encounter for fracture with routine healing: Secondary | ICD-10-CM | POA: Diagnosis not present

## 2017-12-06 DIAGNOSIS — I10 Essential (primary) hypertension: Secondary | ICD-10-CM | POA: Diagnosis not present

## 2017-12-06 LAB — POCT URINALYSIS DIPSTICK
Bilirubin, UA: NEGATIVE
Blood, UA: NEGATIVE
GLUCOSE UA: NEGATIVE
KETONES UA: NEGATIVE
Nitrite, UA: NEGATIVE
Protein, UA: NEGATIVE
Spec Grav, UA: 1.01 (ref 1.010–1.025)
Urobilinogen, UA: 0.2 E.U./dL
pH, UA: 5 (ref 5.0–8.0)

## 2017-12-06 MED ORDER — CEFUROXIME AXETIL 250 MG PO TABS
250.0000 mg | ORAL_TABLET | Freq: Two times a day (BID) | ORAL | 0 refills | Status: DC
Start: 2017-12-06 — End: 2018-02-06

## 2017-12-06 NOTE — Progress Notes (Signed)
[facility] [place], [place] [address]  Internal MEDICINE  Office Visit Note  Patient Name: Sabrina Zamora  [id number]  [id number]  Date of Service: 12/08/2017   Pt is here for routine follow up.   Chief Complaint  Patient presents with  . Urinary Tract Infection    Urinary Tract Infection   This is a recurrent problem. The current episode started more than 1 month ago. The problem occurs intermittently. The problem has been unchanged. The patient is experiencing no pain. There has been no fever. She is not sexually active. There is no history of pyelonephritis. Associated symptoms include frequency. Pertinent negatives include no chills, flank pain, nausea or vomiting. She has tried antibiotics for the symptoms. The treatment provided mild relief. Her past medical history is significant for recurrent UTIs.       Current Medication: Outpatient Encounter Medications as of 12/06/2017  Medication Sig Note  . amLODipine (NORVASC) 2.5 MG tablet Take 2.5 mg by mouth daily.   Marland Kitchen aspirin EC 81 MG tablet Take 81 mg by mouth. 02/04/2017: Takes every other day.  . cefUROXime (CEFTIN) 250 MG tablet Take 1 tablet (250 mg total) by mouth 2 (two) times daily with a meal.   . cephALEXin (KEFLEX) 250 MG capsule Take 1 capsule (250 mg total) by mouth 3 (three) times daily.   . Cholecalciferol (VITAMIN D PO) Take by mouth.   . docusate sodium (COLACE) 100 MG capsule Take 100 mg by mouth. 05/06/2015: Received from: [facility]  . doxycycline (VIBRA-TABS) 100 MG tablet Take 1 tablet (100 mg total) by mouth 2 (two) times daily.   . Fluticasone-Salmeterol (ADVAIR) 250-50 MCG/DOSE AEPB Inhale into the lungs. 05/06/2015: Received from: [facility]  . levothyroxine (SYNTHROID, LEVOTHROID) 25 MCG tablet Take 25 mcg by mouth daily.  05/06/2015: Received from: [facility]  . lovastatin (MEVACOR) 20 MG tablet TAKE ONE TABLET BY MOUTH AT BEDTIME   . Multiple Vitamin  (MULTI-VITAMINS) TABS Take by mouth. 05/06/2015: Received from: [facility]  . mupirocin nasal ointment (BACTROBAN) 2 % Apply to nasal area at bedtime as needed   . omeprazole (PRILOSEC) 20 MG capsule Take 20 mg by mouth every other day.  05/06/2015: Received from: [facility]   No facility-administered encounter medications on file as of 12/06/2017.     Surgical History: Past Surgical History:  Procedure Laterality Date  . BREAST BIOPSY Left 2003   neg  . BREAST BIOPSY Right 09/27/2017   US biopsy of 2 areas  . BREAST EXCISIONAL BIOPSY Left 1988   excisional - neg  . CATARACT EXTRACTION W/PHACO Left 09/20/2017   Procedure: CATARACT EXTRACTION PHACO AND INTRAOCULAR LENS PLACEMENT ([place]) LEFT;  Surgeon: Leandrew Koyanagi, MD;  Location: [place];  Service: Ophthalmology;  Laterality: Left;  . CHOLECYSTECTOMY      Medical History: Past Medical History:  Diagnosis Date  . Asthma   . Hypothyroidism   . Osteoporosis   . Skin cancer 2012, 2016    Family History: Family History  Problem Relation Age of Onset  . Clotting disorder Sister   . Breast cancer Sister 55    Social History   Socioeconomic History  . Marital status: Married    Spouse name: Not on file  . Number of children: Not on file  . Years of education: Not on file  . Highest education level: Not on file  Occupational History  . Not on file  Social Needs  .  Financial resource strain: Not on file  . Food insecurity:    Worry: Not on file    Inability: Not on file  . Transportation needs:    Medical: Not on file    Non-medical: Not on file  Tobacco Use  . Smoking status: Never Smoker  . Smokeless tobacco: Never Used  Substance and Sexual Activity  . Alcohol use: Never    Alcohol/week: 0.0 oz    Frequency: Never  . Drug use: Never  . Sexual activity: Not on file  Lifestyle  . Physical activity:    Days per week: Not on file    Minutes per session: Not on file  . Stress: Not on  file  Relationships  . Social connections:    Talks on phone: Not on file    Gets together: Not on file    Attends religious service: Not on file    Active member of club or organization: Not on file    Attends meetings of clubs or organizations: Not on file    Relationship status: Not on file  . Intimate partner violence:    Fear of current or ex partner: Not on file    Emotionally abused: Not on file    Physically abused: Not on file    Forced sexual activity: Not on file  Other Topics Concern  . Not on file  Social History Narrative  . Not on file      Review of Systems  Constitutional: Negative for chills, diaphoresis and fatigue.  HENT: Negative for ear pain, postnasal drip and sinus pressure.   Eyes: Negative for photophobia, discharge, redness, itching and visual disturbance.  Respiratory: Negative for cough, shortness of breath and wheezing.   Cardiovascular: Negative for chest pain, palpitations and leg swelling.  Gastrointestinal: Negative for abdominal pain, constipation, diarrhea, nausea and vomiting.  Genitourinary: Positive for frequency. Negative for dysuria and flank pain.  Musculoskeletal: Negative for arthralgias, back pain, gait problem and neck pain.  Skin: Negative for color change.  Allergic/Immunologic: Negative for environmental allergies and food allergies.  Neurological: Negative for dizziness and headaches.  Hematological: Does not bruise/bleed easily.  Psychiatric/Behavioral: Negative for agitation, behavioral problems (depression) and hallucinations.    Today's Vitals   12/06/17 1422  BP: (!) 168/92  Pulse: 85  Resp: 16  Temp: 99.1 F (37.3 C)  SpO2: 94%  Weight: 102 lb 9.6 oz (46.5 kg)  Height: 5' 2.5" (1.588 m)    Physical Exam  Constitutional: She is oriented to person, place, and time. She appears well-developed and well-nourished. No distress.  HENT:  Head: Normocephalic and atraumatic.  Mouth/Throat: Oropharynx is clear and  moist. No oropharyngeal exudate.  Eyes: Pupils are equal, round, and reactive to light. Conjunctivae and EOM are normal.  Neck: Normal range of motion. Neck supple. No JVD present. Carotid bruit is not present. No tracheal deviation present. No thyromegaly present.  Cardiovascular: Normal rate, regular rhythm and normal heart sounds. Exam reveals no gallop and no friction rub.  No murmur heard. Pulmonary/Chest: Effort normal and breath sounds normal. No respiratory distress. She has no rales. She exhibits no tenderness.  Abdominal: Soft. Bowel sounds are normal. There is no tenderness.  Genitourinary:  Genitourinary Comments: Urine sample positive for moderate WBC  Musculoskeletal: Normal range of motion.  Lymphadenopathy:    She has no cervical adenopathy.  Neurological: She is alert and oriented to person, place, and time. No cranial nerve deficit.  Skin: Skin is warm and dry. She is  not diaphoretic.  Psychiatric: She has a normal mood and affect. Her behavior is normal. Judgment and thought content normal.  Nursing note and vitals reviewed.  Assessment/Plan: 1. Urinary tract infection without hematuria, site unspecified U/a positive for moderate WBC. Start ceftin 250mg  bid for 21 days. Send for culture and sensitivity. Will get ultrasound of bldder and kidneys ffor further evaluation.  - CULTURE, URINE COMPREHENSIVE - [place], retroperitnl abd,  ltd - US Renal; Future - cefUROXime (CEFTIN) 250 MG tablet; Take 1 tablet (250 mg total) by mouth 2 (two) times daily with a meal.  Dispense: 42 tablet; Refill: 0  2. Dysuria Urine sample continues to be positive for infection. Start ceftine 250mg  twice daily for 21 dasy. Ultrasound bladder and kidneys for ffurther evaluation.  - POCT Urinalysis Dipstick  3. Essential hypertension, benign Generally stable. Continue bp medication as prescribed.   4. Acquired hypothyroidism Stable. Continue hypothyroid as prescribed   General Counseling: Shulamis  verbalizes understanding of the findings of todays visit and agrees with plan of treatment. I have discussed any further diagnostic evaluation that may be needed or ordered today. We also reviewed her medications today. she has been encouraged to call the office with any questions or concerns that should arise related to todays visit.    Counseling:  This patient was seen by Leretha Pol, FNP- C in Collaboration with Dr Lavera Guise as a part of collaborative care agreement  Orders Placed This Encounter  Procedures  . CULTURE, URINE COMPREHENSIVE  . US Renal  . [place], retroperitnl abd,  ltd  . POCT Urinalysis Dipstick    Meds ordered this encounter  Medications  . cefUROXime (CEFTIN) 250 MG tablet    Sig: Take 1 tablet (250 mg total) by mouth 2 (two) times daily with a meal.    Dispense:  42 tablet    Refill:  0    Order Specific Question:   Supervising Provider    Answer:   Lavera Guise [[id number]]    Time spent: 88 Minutes     Dr Lavera Guise Internal medicine

## 2017-12-09 ENCOUNTER — Ambulatory Visit: Payer: Self-pay

## 2017-12-09 LAB — CULTURE, URINE COMPREHENSIVE

## 2017-12-16 ENCOUNTER — Ambulatory Visit (INDEPENDENT_AMBULATORY_CARE_PROVIDER_SITE_OTHER): Payer: Medicare Other

## 2017-12-16 DIAGNOSIS — N390 Urinary tract infection, site not specified: Secondary | ICD-10-CM | POA: Diagnosis not present

## 2017-12-19 DIAGNOSIS — L986 Other infiltrative disorders of the skin and subcutaneous tissue: Secondary | ICD-10-CM | POA: Diagnosis not present

## 2017-12-19 DIAGNOSIS — D485 Neoplasm of uncertain behavior of skin: Secondary | ICD-10-CM | POA: Diagnosis not present

## 2017-12-22 ENCOUNTER — Encounter

## 2017-12-22 ENCOUNTER — Ambulatory Visit (INDEPENDENT_AMBULATORY_CARE_PROVIDER_SITE_OTHER): Payer: Medicare Other | Admitting: Urology

## 2017-12-22 ENCOUNTER — Encounter: Payer: Self-pay | Admitting: Urology

## 2017-12-22 VITALS — BP 174/84 | HR 80 | Ht 62.0 in | Wt 99.0 lb

## 2017-12-22 DIAGNOSIS — R8271 Bacteriuria: Secondary | ICD-10-CM

## 2017-12-22 DIAGNOSIS — N390 Urinary tract infection, site not specified: Secondary | ICD-10-CM | POA: Diagnosis not present

## 2017-12-22 DIAGNOSIS — R319 Hematuria, unspecified: Secondary | ICD-10-CM | POA: Diagnosis not present

## 2017-12-22 LAB — URINALYSIS, COMPLETE
BILIRUBIN UA: NEGATIVE
GLUCOSE, UA: NEGATIVE
KETONES UA: NEGATIVE
LEUKOCYTES UA: NEGATIVE
Nitrite, UA: NEGATIVE
PROTEIN UA: NEGATIVE
SPEC GRAV UA: 1.015 (ref 1.005–1.030)
Urobilinogen, Ur: 0.2 mg/dL (ref 0.2–1.0)
pH, UA: 6 (ref 5.0–7.5)

## 2017-12-22 LAB — BLADDER SCAN AMB NON-IMAGING

## 2017-12-22 LAB — MICROSCOPIC EXAMINATION
Bacteria, UA: NONE SEEN
Epithelial Cells (non renal): NONE SEEN /hpf (ref 0–10)
WBC, UA: NONE SEEN /hpf (ref 0–5)

## 2017-12-22 MED ORDER — ESTROGENS, CONJUGATED 0.625 MG/GM VA CREA: 1.0000 | TOPICAL_CREAM | Freq: Every day | VAGINAL | 3 refills | Status: DC

## 2017-12-22 NOTE — Progress Notes (Signed)
12/22/2017 1:19 PM   Sabrina Zamora Nov 11, 1930 [id number]  Referring provider: Lavera Guise, [place], [place] [address]  Chief Complaint  Patient presents with  . Recurrent UTI    New Patient    HPI: 84 year old female referred for further evaluation of dysuria/recurrent UTIs.  She is been struggling with recurrent/persistent urinary tract infections since April 2019.  On 10/31/2017, she grew Citrobacter and Proteus which is fairly sensitive but she does have multiple drug allergies.  She then grew Proteus again on 11/15/2017.  Most recently, she grew the same Proteus and Citrobacter on 12/06/2017.  Renal ultrasound was performed on 12/16/2017.  For unclear reasons, I am unable to see report or images.  She notes that her initial UTI on this occasion, she was going in for a blood pressure check and she also had her urine checked routinely.  At the time, she was relatively asymptomatic.  Her urine was checked and rechecked for infection or evidence of clearance but she had no significant urinary symptoms throughout.  She notes that over the past few years, she is had approximately 2-3 urinary tract infections every year but these are only rarely symptomatic.  No history of pyelonephritis or febrile UTIs.  No flank pain or gross hematuria.  She is not currently sexually active.  She was on Premarin cream in the remote past.  She does take cranberry tabs once daily.  She also takes a probiotic.  At baseline, she denies any urinary frequency, urgency, or significant incontinence.  No dysuria.  No significant nocturia.  She is very pleased with her voiding symptoms.  She is extremely healthy for her age and advises that she "keeps moving" at this degree to her good health.   PMH: Past Medical History:  Diagnosis Date  . Asthma   . Hypothyroidism   . Osteoporosis   . Skin cancer 2012, 2016    Surgical History: Past Surgical History:  Procedure Laterality Date  .  BREAST BIOPSY Left 2003   neg  . BREAST BIOPSY Right 09/27/2017   US biopsy of 2 areas  . BREAST EXCISIONAL BIOPSY Left 1988   excisional - neg  . CATARACT EXTRACTION W/PHACO Left 09/20/2017   Procedure: CATARACT EXTRACTION PHACO AND INTRAOCULAR LENS PLACEMENT ([place]) LEFT;  Surgeon: Leandrew Koyanagi, MD;  Location: [place];  Service: Ophthalmology;  Laterality: Left;  . CHOLECYSTECTOMY      Home Medications:  Allergies as of 12/22/2017      Reactions   Levofloxacin Rash, Shortness Of Breath   Penicillins Hives, Rash   Betaine Rash   Ciprofloxacin Itching, Rash   Sulfa Antibiotics Rash      Medication List        Accurate as of 12/22/17  1:19 PM. Always use your most recent med list.          amLODipine 2.5 MG tablet Commonly known as:  NORVASC Take 2.5 mg by mouth daily.   aspirin EC 81 MG tablet Take 81 mg by mouth.   cefUROXime 250 MG tablet Commonly known as:  CEFTIN Take 1 tablet (250 mg total) by mouth 2 (two) times daily with a meal.   conjugated estrogens vaginal cream Commonly known as:  PREMARIN Place 1 Applicatorful vaginally daily. Use pea sized amount M-W-Fr before bedtime   docusate sodium 100 MG capsule Commonly known as:  COLACE Take 100 mg by mouth.   Fluticasone-Salmeterol 250-50 MCG/DOSE Aepb Commonly known as:  ADVAIR Inhale into the  lungs.   levothyroxine 25 MCG tablet Commonly known as:  SYNTHROID, LEVOTHROID Take 25 mcg by mouth daily.   lovastatin 20 MG tablet Commonly known as:  MEVACOR TAKE ONE TABLET BY MOUTH AT BEDTIME   MULTI-VITAMINS Tabs Take by mouth.   omeprazole 20 MG capsule Commonly known as:  PRILOSEC Take 20 mg by mouth every other day.   VITAMIN D PO Take by mouth.       Allergies:  Allergies  Allergen Reactions  . Levofloxacin Rash and Shortness Of Breath  . Penicillins Hives and Rash  . Betaine Rash  . Ciprofloxacin Itching and Rash  . Sulfa Antibiotics Rash    Family  History: Family History  Problem Relation Age of Onset  . Clotting disorder Sister   . Breast cancer Sister 26    Social History:  reports that she has never smoked. She has never used smokeless tobacco. She reports that she does not drink alcohol or use drugs.  ROS: UROLOGY Frequent Urination?: No Hard to postpone urination?: No Burning/pain with urination?: No Get up at night to urinate?: Yes Leakage of urine?: No Urine stream starts and stops?: No Trouble starting stream?: No Do you have to strain to urinate?: No Blood in urine?: No Urinary tract infection?: Yes Sexually transmitted disease?: No Injury to kidneys or bladder?: No Painful intercourse?: No Weak stream?: No Currently pregnant?: No Vaginal bleeding?: No Last menstrual period?: n  Gastrointestinal Nausea?: No Vomiting?: No Indigestion/heartburn?: No Diarrhea?: No Constipation?: No  Constitutional Fever: No Night sweats?: No Weight loss?: No Fatigue?: No  Skin Skin rash/lesions?: No Itching?: No  Eyes Blurred vision?: No Double vision?: No  Ears/Nose/Throat Sore throat?: No Sinus problems?: No  Hematologic/Lymphatic Swollen glands?: No Easy bruising?: No  Cardiovascular Leg swelling?: No Chest pain?: No  Respiratory Cough?: No Shortness of breath?: No  Endocrine Excessive thirst?: No  Musculoskeletal Back pain?: No Joint pain?: No  Neurological Headaches?: No Dizziness?: No  Psychologic Depression?: No Anxiety?: No  Physical Exam: BP (!) 174/84   Pulse 80   Ht 5\' 2"  (1.575 m)   Wt 99 lb (44.9 kg)   BMI 18.11 kg/m   Constitutional:  Alert and oriented, No acute distress.  Appears younger than stated age. HEENT: [place] AT, moist mucus membranes.  Trachea midline, no masses. Cardiovascular: No clubbing, cyanosis, or edema. Respiratory: Normal respiratory effort, no increased work of breathing. GI: Abdomen is soft, nontender, nondistended, no abdominal masses GU: No CVA  tenderness Skin: No rashes, bruises or suspicious lesions. Neurologic: Grossly intact, no focal deficits, moving all 4 extremities. Psychiatric: Normal mood and affect.  Laboratory Data: Lab Results  Component Value Date   WBC 7.7 08/08/2017   HGB 16.7 (H) 08/08/2017   HCT 50.5 (H) 08/08/2017   MCV 94.6 08/08/2017   PLT 240 08/08/2017    Lab Results  Component Value Date   CREATININE 0.92 07/14/2012    Urinalysis Results for orders placed or performed in visit on 12/22/17  Microscopic Examination  Result Value Ref Range   WBC, UA None seen 0 - 5 /hpf   RBC, UA 0-2 0 - 2 /hpf   Epithelial Cells (non renal) None seen 0 - 10 /hpf   Bacteria, UA None seen None seen/Few  Urinalysis, Complete  Result Value Ref Range   Specific Gravity, UA 1.015 1.005 - 1.030   pH, UA 6.0 5.0 - 7.5   Color, UA Yellow Yellow   Appearance Ur Clear Clear   Leukocytes, UA  Negative Negative   Protein, UA Negative Negative/Trace   Glucose, UA Negative Negative   Ketones, UA Negative Negative   RBC, UA Trace (A) Negative   Bilirubin, UA Negative Negative   Urobilinogen, Ur 0.2 0.2 - 1.0 mg/dL   Nitrite, UA Negative Negative   Microscopic Examination See below:   BLADDER SCAN AMB NON-IMAGING  Result Value Ref Range   Scan Result 40ml     Pertinent Imaging: Renal ultrasound on 6 7, awaiting results but presumably normal   Assessment & Plan:    1. Asymptomatic bacteriuria Extensive review of records as well as patient history indicate that she has asymptomatic bacteriuria and is chronically colonized rather than true cystitis We had a lengthy discussion reviewing the signs and symptoms of true urinary tract infection of which the patient has none currently. We discussed the concept of antibiotic stewardship and reserving the use only for symptomatic infections especially given her multiple drug allergies limiting her ability to administer many important antibiotics and time of need Recommend  continuation of probiotics, increase cranberry tabs twice a day, and discussed the addition of topical estrogen cream to urethra 3 times per week as she was previously using for UTI prevention In the future, she will return to our office for nurse visit if she develops any symptoms of acute cystitis - Urinalysis, Complete - BLADDER SCAN AMB NON-IMAGING   Return if symptoms worsen or fail to improve.  Hollice Espy, MD  [facility] [address] [place],  [address] [phone]

## 2017-12-28 DIAGNOSIS — S92351D Displaced fracture of fifth metatarsal bone, right foot, subsequent encounter for fracture with routine healing: Secondary | ICD-10-CM | POA: Diagnosis not present

## 2018-01-06 ENCOUNTER — Encounter: Payer: Self-pay | Admitting: Nurse Practitioner

## 2018-01-06 ENCOUNTER — Ambulatory Visit (INDEPENDENT_AMBULATORY_CARE_PROVIDER_SITE_OTHER): Payer: Medicare Other | Admitting: Nurse Practitioner

## 2018-01-06 VITALS — BP 171/88 | HR 74 | Resp 16 | Ht 63.5 in | Wt 97.4 lb

## 2018-01-06 DIAGNOSIS — I10 Essential (primary) hypertension: Secondary | ICD-10-CM | POA: Diagnosis not present

## 2018-01-06 DIAGNOSIS — E039 Hypothyroidism, unspecified: Secondary | ICD-10-CM | POA: Diagnosis not present

## 2018-01-06 DIAGNOSIS — N390 Urinary tract infection, site not specified: Secondary | ICD-10-CM

## 2018-01-06 DIAGNOSIS — R319 Hematuria, unspecified: Secondary | ICD-10-CM | POA: Diagnosis not present

## 2018-01-06 NOTE — Progress Notes (Signed)
[facility] [place], [place] [address]  Internal MEDICINE  Office Visit Note  Patient Name: Sabrina Zamora  [id number]  [id number]  Date of Service: 01/29/2018   Pt is here for routine follow up.    Chief Complaint  Patient presents with  . Urinary Tract Infection    PT STATED IT WAS CHECKED ON JUNE 13TH WITH THE SPECIALIST SHE DIDNT WANT ANOTHER ONE TODAY  . Results    ULTRASOUND REVIEW    Urinary Tract Infection   This is a recurrent problem. The current episode started more than 1 month ago. The problem occurs intermittently. The problem has been unchanged. The patient is experiencing no pain. There has been no fever. She is not sexually active. There is no history of pyelonephritis. Pertinent negatives include no chills, flank pain, frequency, nausea, urgency or vomiting. She has tried antibiotics for the symptoms. The treatment provided mild relief. Her past medical history is significant for recurrent UTIs.       Current Medication: Outpatient Encounter Medications as of 01/06/2018  Medication Sig Note  . amLODipine (NORVASC) 2.5 MG tablet Take 2.5 mg by mouth daily.   Marland Kitchen aspirin EC 81 MG tablet Take 81 mg by mouth. 02/04/2017: Takes every other day.  . cefUROXime (CEFTIN) 250 MG tablet Take 1 tablet (250 mg total) by mouth 2 (two) times daily with a meal.   . Cholecalciferol (VITAMIN D PO) Take by mouth.   . conjugated estrogens (PREMARIN) vaginal cream Place 1 Applicatorful vaginally daily. Use pea sized amount M-W-Fr before bedtime   . docusate sodium (COLACE) 100 MG capsule Take 100 mg by mouth. 05/06/2015: Received from: [facility]  . Fluticasone-Salmeterol (ADVAIR) 250-50 MCG/DOSE AEPB Inhale into the lungs. 05/06/2015: Received from: [facility]  . levothyroxine (SYNTHROID, LEVOTHROID) 25 MCG tablet Take 25 mcg by mouth daily.  05/06/2015: Received from: [facility]  . lovastatin (MEVACOR) 20 MG tablet TAKE ONE TABLET BY MOUTH AT  BEDTIME   . Multiple Vitamin (MULTI-VITAMINS) TABS Take by mouth. 05/06/2015: Received from: [facility]  . omeprazole (PRILOSEC) 20 MG capsule Take 20 mg by mouth every other day.  05/06/2015: Received from: [facility]   No facility-administered encounter medications on file as of 01/06/2018.     Surgical History: Past Surgical History:  Procedure Laterality Date  . BREAST BIOPSY Left 2003   neg  . BREAST BIOPSY Right 09/27/2017   US biopsy of 2 areas  . BREAST EXCISIONAL BIOPSY Left 1988   excisional - neg  . CATARACT EXTRACTION W/PHACO Left 09/20/2017   Procedure: CATARACT EXTRACTION PHACO AND INTRAOCULAR LENS PLACEMENT ([place]) LEFT;  Surgeon: Leandrew Koyanagi, MD;  Location: [place];  Service: Ophthalmology;  Laterality: Left;  . CHOLECYSTECTOMY      Medical History: Past Medical History:  Diagnosis Date  . Asthma   . Hypothyroidism   . Osteoporosis   . Skin cancer 2012, 2016    Family History: Family History  Problem Relation Age of Onset  . Clotting disorder Sister   . Breast cancer Sister 63    Social History   Socioeconomic History  . Marital status: Married    Spouse name: Not on file  . Number of children: Not on file  . Years of education: Not on file  . Highest education level: Not on file  Occupational History  . Not on file  Social Needs  . Financial resource strain: Not on file  .  Food insecurity:    Worry: Not on file    Inability: Not on file  . Transportation needs:    Medical: Not on file    Non-medical: Not on file  Tobacco Use  . Smoking status: Never Smoker  . Smokeless tobacco: Never Used  Substance and Sexual Activity  . Alcohol use: Never    Alcohol/week: 0.0 oz    Frequency: Never  . Drug use: Never  . Sexual activity: Not on file  Lifestyle  . Physical activity:    Days per week: Not on file    Minutes per session: Not on file  . Stress: Not on file  Relationships  . Social connections:    Talks on  phone: Not on file    Gets together: Not on file    Attends religious service: Not on file    Active member of club or organization: Not on file    Attends meetings of clubs or organizations: Not on file    Relationship status: Not on file  . Intimate partner violence:    Fear of current or ex partner: Not on file    Emotionally abused: Not on file    Physically abused: Not on file    Forced sexual activity: Not on file  Other Topics Concern  . Not on file  Social History Narrative  . Not on file    Review of Systems  Constitutional: Negative for chills, diaphoresis, fatigue and unexpected weight change.  HENT: Negative for ear pain, postnasal drip and sinus pressure.   Eyes: Negative for photophobia, discharge, redness, itching and visual disturbance.  Respiratory: Negative for cough, shortness of breath and wheezing.   Cardiovascular: Negative for chest pain, palpitations and leg swelling.       Elevated blood pressure.  Gastrointestinal: Negative for abdominal pain, constipation, diarrhea, nausea and vomiting.  Endocrine: Negative for cold intolerance, heat intolerance, polydipsia, polyphagia and polyuria.  Genitourinary: Negative for dysuria, flank pain, frequency and urgency.  Musculoskeletal: Negative for arthralgias, back pain, gait problem and neck pain.  Skin: Negative for color change and rash.  Allergic/Immunologic: Negative for environmental allergies and food allergies.  Neurological: Negative for dizziness, syncope and headaches.  Hematological: Does not bruise/bleed easily.  Psychiatric/Behavioral: Negative for agitation, behavioral problems (depression) and hallucinations. The patient is not nervous/anxious.     Today's Vitals   01/06/18 1154  BP: (!) 171/88  Pulse: 74  Resp: 16  SpO2: 96%  Weight: 97 lb 6.4 oz (44.2 kg)  Height: 5' 3.5" (1.613 m)    Physical Exam  Constitutional: She is oriented to person, place, and time. She appears well-developed and  well-nourished. No distress.  HENT:  Head: Normocephalic and atraumatic.  Nose: Nose normal.  Mouth/Throat: Oropharynx is clear and moist. No oropharyngeal exudate.  Eyes: Pupils are equal, round, and reactive to light. Conjunctivae and EOM are normal.  Neck: Normal range of motion. Neck supple. No JVD present. Carotid bruit is not present. No tracheal deviation present. No thyromegaly present.  Cardiovascular: Normal rate, regular rhythm and normal heart sounds. Exam reveals no gallop and no friction rub.  No murmur heard. Pulmonary/Chest: Effort normal and breath sounds normal. No respiratory distress. She has no rales. She exhibits no tenderness.  Abdominal: Soft. Bowel sounds are normal. There is no tenderness.  Genitourinary:  Genitourinary Comments: Urine sample positive for moderate WBC.  Musculoskeletal: Normal range of motion.  Lymphadenopathy:    She has no cervical adenopathy.  Neurological: She is alert  and oriented to person, place, and time. No cranial nerve deficit.  Skin: Skin is warm and dry. Capillary refill takes 2 to 3 seconds. She is not diaphoretic.  Psychiatric: She has a normal mood and affect. Her behavior is normal. Judgment and thought content normal.  Nursing note and vitals reviewed.  Assessment/Plan: 1. Urinary tract infection with hematuria, site unspecified Continue antibiotics as prescribed. Patient to see urology for further evaluation and treatment.   2. Benign hypertension Generally stable though elevated today. Continue bp medication as prescribed.    3. Acquired hypothyroidism Continue levothyroxine as prescribed.   General Counseling: Zamiyah verbalizes understanding of the findings of todays visit and agrees with plan of treatment. I have discussed any further diagnostic evaluation that may be needed or ordered today. We also reviewed her medications today. she has been encouraged to call the office with any questions or concerns that should arise  related to todays visit.    Counseling:  This patient was seen by Leretha Pol FNP Collaboration with Dr Lavera Guise as a part of collaborative care agreement   Time spent: 106 Minutes     Dr Lavera Guise Internal medicine

## 2018-01-16 ENCOUNTER — Ambulatory Visit: Payer: Self-pay | Admitting: Internal Medicine

## 2018-01-20 DIAGNOSIS — S92351D Displaced fracture of fifth metatarsal bone, right foot, subsequent encounter for fracture with routine healing: Secondary | ICD-10-CM | POA: Diagnosis not present

## 2018-01-26 DIAGNOSIS — H2511 Age-related nuclear cataract, right eye: Secondary | ICD-10-CM | POA: Diagnosis not present

## 2018-01-26 DIAGNOSIS — M810 Age-related osteoporosis without current pathological fracture: Secondary | ICD-10-CM | POA: Diagnosis not present

## 2018-02-02 DIAGNOSIS — Z08 Encounter for follow-up examination after completed treatment for malignant neoplasm: Secondary | ICD-10-CM | POA: Diagnosis not present

## 2018-02-02 DIAGNOSIS — Z09 Encounter for follow-up examination after completed treatment for conditions other than malignant neoplasm: Secondary | ICD-10-CM | POA: Diagnosis not present

## 2018-02-02 DIAGNOSIS — Z872 Personal history of diseases of the skin and subcutaneous tissue: Secondary | ICD-10-CM | POA: Diagnosis not present

## 2018-02-02 DIAGNOSIS — Z85828 Personal history of other malignant neoplasm of skin: Secondary | ICD-10-CM | POA: Diagnosis not present

## 2018-02-02 DIAGNOSIS — Z85820 Personal history of malignant melanoma of skin: Secondary | ICD-10-CM | POA: Diagnosis not present

## 2018-02-06 ENCOUNTER — Inpatient Hospital Stay: Payer: Medicare Other

## 2018-02-06 ENCOUNTER — Inpatient Hospital Stay: Payer: Medicare Other | Attending: Hematology and Oncology

## 2018-02-06 ENCOUNTER — Other Ambulatory Visit: Payer: Self-pay

## 2018-02-06 ENCOUNTER — Encounter: Payer: Self-pay | Admitting: *Deleted

## 2018-02-06 DIAGNOSIS — D45 Polycythemia vera: Secondary | ICD-10-CM | POA: Insufficient documentation

## 2018-02-06 DIAGNOSIS — D751 Secondary polycythemia: Secondary | ICD-10-CM

## 2018-02-06 LAB — CBC WITH DIFFERENTIAL/PLATELET
Basophils Absolute: 0.1 10*3/uL (ref 0–0.1)
Basophils Relative: 1 %
Eosinophils Absolute: 0.1 10*3/uL (ref 0–0.7)
Eosinophils Relative: 2 %
HCT: 48.3 % — ABNORMAL HIGH (ref 35.0–47.0)
Hemoglobin: 16.2 g/dL — ABNORMAL HIGH (ref 12.0–16.0)
Lymphocytes Relative: 32 %
Lymphs Abs: 2.1 10*3/uL (ref 1.0–3.6)
MCH: 31.7 pg (ref 26.0–34.0)
MCHC: 33.5 g/dL (ref 32.0–36.0)
MCV: 94.6 fL (ref 80.0–100.0)
Monocytes Absolute: 0.7 10*3/uL (ref 0.2–0.9)
Monocytes Relative: 11 %
Neutro Abs: 3.7 10*3/uL (ref 1.4–6.5)
Neutrophils Relative %: 54 %
Platelets: 215 10*3/uL (ref 150–440)
RBC: 5.1 MIL/uL (ref 3.80–5.20)
RDW: 14.3 % (ref 11.5–14.5)
WBC: 6.7 10*3/uL (ref 3.6–11.0)

## 2018-02-08 NOTE — Discharge Instructions (Signed)

## 2018-02-15 ENCOUNTER — Encounter
Admission: RE | Disposition: A | Discharge status: Home / Self Care | Payer: Self-pay | Source: Ambulatory Visit | Attending: Ophthalmology

## 2018-02-15 ENCOUNTER — Ambulatory Visit: Payer: Medicare Other | Admitting: Anesthesiology

## 2018-02-15 ENCOUNTER — Ambulatory Visit
Admission: RE | Admit: 2018-02-15 | Discharge: 2018-02-15 | Disposition: A | Discharge status: Home / Self Care | Payer: Medicare Other | Source: Ambulatory Visit | Attending: Ophthalmology | Admitting: Ophthalmology

## 2018-02-15 DIAGNOSIS — Z9049 Acquired absence of other specified parts of digestive tract: Secondary | ICD-10-CM | POA: Diagnosis not present

## 2018-02-15 DIAGNOSIS — J45909 Unspecified asthma, uncomplicated: Secondary | ICD-10-CM | POA: Insufficient documentation

## 2018-02-15 DIAGNOSIS — Z881 Allergy status to other antibiotic agents status: Secondary | ICD-10-CM | POA: Diagnosis not present

## 2018-02-15 DIAGNOSIS — Z882 Allergy status to sulfonamides status: Secondary | ICD-10-CM | POA: Diagnosis not present

## 2018-02-15 DIAGNOSIS — R002 Palpitations: Secondary | ICD-10-CM | POA: Insufficient documentation

## 2018-02-15 DIAGNOSIS — E7800 Pure hypercholesterolemia, unspecified: Secondary | ICD-10-CM | POA: Diagnosis not present

## 2018-02-15 DIAGNOSIS — Z880 Allergy status to penicillin: Secondary | ICD-10-CM | POA: Diagnosis not present

## 2018-02-15 DIAGNOSIS — G4730 Sleep apnea, unspecified: Secondary | ICD-10-CM | POA: Insufficient documentation

## 2018-02-15 DIAGNOSIS — H9190 Unspecified hearing loss, unspecified ear: Secondary | ICD-10-CM | POA: Diagnosis not present

## 2018-02-15 DIAGNOSIS — M810 Age-related osteoporosis without current pathological fracture: Secondary | ICD-10-CM | POA: Diagnosis not present

## 2018-02-15 DIAGNOSIS — Z85828 Personal history of other malignant neoplasm of skin: Secondary | ICD-10-CM | POA: Diagnosis not present

## 2018-02-15 DIAGNOSIS — H25811 Combined forms of age-related cataract, right eye: Secondary | ICD-10-CM | POA: Diagnosis not present

## 2018-02-15 DIAGNOSIS — E039 Hypothyroidism, unspecified: Secondary | ICD-10-CM | POA: Diagnosis not present

## 2018-02-15 DIAGNOSIS — Z888 Allergy status to other drugs, medicaments and biological substances status: Secondary | ICD-10-CM | POA: Insufficient documentation

## 2018-02-15 DIAGNOSIS — I10 Essential (primary) hypertension: Secondary | ICD-10-CM | POA: Diagnosis not present

## 2018-02-15 DIAGNOSIS — H2511 Age-related nuclear cataract, right eye: Secondary | ICD-10-CM | POA: Diagnosis not present

## 2018-02-15 DIAGNOSIS — K449 Diaphragmatic hernia without obstruction or gangrene: Secondary | ICD-10-CM | POA: Diagnosis not present

## 2018-02-15 DIAGNOSIS — Z9842 Cataract extraction status, left eye: Secondary | ICD-10-CM | POA: Insufficient documentation

## 2018-02-15 HISTORY — PX: CATARACT EXTRACTION W/PHACO: SHX586

## 2018-02-15 SURGERY — PHACOEMULSIFICATION, CATARACT, WITH IOL INSERTION
Anesthesia: Monitor Anesthesia Care | Site: Eye | Laterality: Right | Wound class: "Clean "

## 2018-02-15 MED ORDER — BRIMONIDINE TARTRATE-TIMOLOL 0.2-0.5 % OP SOLN
OPHTHALMIC | Status: DC | PRN
  Administered 2018-02-15: 1 [drp] via OPHTHALMIC

## 2018-02-15 MED ORDER — CYCLOPENTOLATE HCL 2 % OP SOLN
1.0000 [drp] | OPHTHALMIC | Status: DC | PRN
  Administered 2018-02-15 (×3): 1 [drp] via OPHTHALMIC

## 2018-02-15 MED ORDER — POLYMYXIN B-TRIMETHOPRIM 10000-0.1 UNIT/ML-% OP SOLN
1.0000 [drp] | OPHTHALMIC | Status: DC | PRN
  Administered 2018-02-15 (×3): 1 [drp] via OPHTHALMIC

## 2018-02-15 MED ORDER — CEFUROXIME OPHTHALMIC INJECTION 1 MG/0.1 ML
INJECTION | OPHTHALMIC | Status: DC | PRN
  Administered 2018-02-15: 0.1 mL via OPHTHALMIC

## 2018-02-15 MED ORDER — PHENYLEPHRINE HCL 10 % OP SOLN
1.0000 [drp] | OPHTHALMIC | Status: DC | PRN
  Administered 2018-02-15 (×3): 1 [drp] via OPHTHALMIC

## 2018-02-15 MED ORDER — MIDAZOLAM HCL 2 MG/2ML IJ SOLN
INTRAMUSCULAR | Status: DC | PRN
  Administered 2018-02-15: 1 mg via INTRAVENOUS

## 2018-02-15 MED ORDER — FENTANYL CITRATE (PF) 100 MCG/2ML IJ SOLN
INTRAMUSCULAR | Status: DC | PRN
  Administered 2018-02-15: 50 ug via INTRAVENOUS

## 2018-02-15 MED ORDER — EPINEPHRINE PF 1 MG/ML IJ SOLN
INTRAOCULAR | Status: DC | PRN
  Administered 2018-02-15: 73 mL via OPHTHALMIC

## 2018-02-15 MED ORDER — TETRACAINE HCL 0.5 % OP SOLN
1.0000 [drp] | OPHTHALMIC | Status: DC | PRN
  Administered 2018-02-15 (×2): 1 [drp] via OPHTHALMIC

## 2018-02-15 MED ORDER — MEPERIDINE HCL 25 MG/ML IJ SOLN: 6.2500 mg | INTRAMUSCULAR | Status: DC | PRN

## 2018-02-15 MED ORDER — OXYCODONE HCL 5 MG/5ML PO SOLN: 5.0000 mg | Freq: Once | ORAL | Status: DC | PRN

## 2018-02-15 MED ORDER — NA HYALUR & NA CHOND-NA HYALUR 0.4-0.35 ML IO KIT
PACK | INTRAOCULAR | Status: DC | PRN
  Administered 2018-02-15: 1 mL via INTRAOCULAR

## 2018-02-15 MED ORDER — LIDOCAINE HCL (PF) 2 % IJ SOLN
INTRAOCULAR | Status: DC | PRN
  Administered 2018-02-15: 1 mL

## 2018-02-15 MED ORDER — LACTATED RINGERS IV SOLN: INTRAVENOUS | Status: DC

## 2018-02-15 MED ORDER — OXYCODONE HCL 5 MG PO TABS: 5.0000 mg | ORAL_TABLET | Freq: Once | ORAL | Status: DC | PRN

## 2018-02-15 MED ORDER — FENTANYL CITRATE (PF) 100 MCG/2ML IJ SOLN: 25.0000 ug | INTRAMUSCULAR | Status: DC | PRN

## 2018-02-15 SURGICAL SUPPLY — 27 items

## 2018-02-15 NOTE — Anesthesia Postprocedure Evaluation (Signed)
Anesthesia Post Note  Patient: Sabrina Zamora  Procedure(s) Performed: CATARACT EXTRACTION PHACO AND INTRAOCULAR LENS PLACEMENT ([facility]) TOPICAL  RIGHT (Right Eye)  Patient location during evaluation: PACU Anesthesia Type: MAC Level of consciousness: awake and alert Pain management: pain level controlled Vital Signs Assessment: post-procedure vital signs reviewed and stable Respiratory status: spontaneous breathing, nonlabored ventilation, respiratory function stable and patient connected to nasal cannula oxygen Cardiovascular status: stable and blood pressure returned to baseline Postop Assessment: no apparent nausea or vomiting Anesthetic complications: no    Tyjon Bowen ELAINE

## 2018-02-15 NOTE — Transfer of Care (Signed)
Immediate Anesthesia Transfer of Care Note  Patient: Sabrina Zamora  Procedure(s) Performed: CATARACT EXTRACTION PHACO AND INTRAOCULAR LENS PLACEMENT ([facility]) TOPICAL  RIGHT (Right Eye)  Patient Location: PACU  Anesthesia Type: MAC  Level of Consciousness: awake, alert  and patient cooperative  Airway and Oxygen Therapy: Patient Spontanous Breathing and Patient connected to supplemental oxygen  Post-op Assessment: Post-op Vital signs reviewed, Patient's Cardiovascular Status Stable, Respiratory Function Stable, Patent Airway and No signs of Nausea or vomiting  Post-op Vital Signs: Reviewed and stable  Complications: No apparent anesthesia complications

## 2018-02-15 NOTE — Op Note (Signed)
LOCATION:  [place]   PREOPERATIVE DIAGNOSIS:    Nuclear sclerotic cataract right eye. H25.11   POSTOPERATIVE DIAGNOSIS:  Nuclear sclerotic cataract right eye.     PROCEDURE:  Phacoemusification with posterior chamber intraocular lens placement of the right eye   LENS:   Implant Name Type Inv. Item Serial No. Manufacturer Lot No. LRB No. Used  LENS IOL DIOP 23.0 - [id number] Intraocular Lens LENS IOL DIOP 23.0 [id number]  Right 1        ULTRASOUND TIME: 19 % of 1 minutes, 6 seconds.  CDE 12.8   SURGEON:  Wyonia Hough, MD   ANESTHESIA:  Topical with tetracaine drops and 2% Xylocaine jelly, augmented with 1% preservative-free intracameral lidocaine.    COMPLICATIONS:  None.   DESCRIPTION OF PROCEDURE:  The patient was identified in the holding room and transported to the operating room and placed in the supine position under the operating microscope.  The right eye was identified as the operative eye and it was prepped and draped in the usual sterile ophthalmic fashion.   A 1 millimeter clear-corneal paracentesis was made at the 12:00 position.  0.5 ml of preservative-free 1% lidocaine was injected into the anterior chamber. The anterior chamber was filled with Viscoat viscoelastic.  A 2.4 millimeter keratome was used to make a near-clear corneal incision at the 9:00 position.  A curvilinear capsulorrhexis was made with a cystotome and capsulorrhexis forceps.  Balanced salt solution was used to hydrodissect and hydrodelineate the nucleus.   Phacoemulsification was then used in stop and chop fashion to remove the lens nucleus and epinucleus.  The remaining cortex was then removed using the irrigation and aspiration handpiece. Provisc was then placed into the capsular bag to distend it for lens placement.  A lens was then injected into the capsular bag.  The remaining viscoelastic was aspirated.   Wounds were hydrated with balanced salt solution.  The anterior  chamber was inflated to a physiologic pressure with balanced salt solution.  No wound leaks were noted. Cefuroxime 0.1 ml of a 10mg /ml solution was injected into the anterior chamber for a dose of 1 mg of intracameral antibiotic at the completion of the case.   Timolol and Brimonidine drops were applied to the eye.  The patient was taken to the recovery room in stable condition without complications of anesthesia or surgery.   Idelle Reimann 02/15/2018, 8:20 AM

## 2018-02-15 NOTE — Anesthesia Procedure Notes (Signed)
Procedure Name: MAC Date/Time: 02/15/2018 8:05 AM Performed by: Janna Arch, CRNA Pre-anesthesia Checklist: Patient identified, Emergency Drugs available, Suction available and Patient being monitored Patient Re-evaluated:Patient Re-evaluated prior to induction Oxygen Delivery Method: Nasal cannula

## 2018-02-15 NOTE — H&P (Signed)
The History and Physical notes are on paper, have been signed, and are to be scanned. The patient remains stable and unchanged from the H&P.   Previous H&P reviewed, patient examined, and there are no changes.  Sabrina Zamora 02/15/2018 7:27 AM

## 2018-02-15 NOTE — Anesthesia Preprocedure Evaluation (Addendum)
Anesthesia Evaluation  Patient identified by MRN, date of birth, ID band Patient awake    Reviewed: Allergy & Precautions, H&P , NPO status , Patient's Chart, lab work & pertinent test results, reviewed documented beta blocker date and time   Airway Mallampati: II  TM Distance: >3 FB Neck ROM: full    Dental no notable dental hx.    Pulmonary neg pulmonary ROS, asthma , sleep apnea ,    Pulmonary exam normal breath sounds clear to auscultation       Cardiovascular Exercise Tolerance: Good hypertension, negative cardio ROS   Rhythm:regular Rate:Normal     Neuro/Psych negative neurological ROS  negative psych ROS   GI/Hepatic negative GI ROS, Neg liver ROS,   Endo/Other  Hypothyroidism   Renal/GU negative Renal ROS  negative genitourinary   Musculoskeletal   Abdominal   Peds  Hematology negative hematology ROS (+)   Anesthesia Other Findings   Reproductive/Obstetrics negative OB ROS                             Anesthesia Physical Anesthesia Plan  ASA: II  Anesthesia Plan: MAC   Post-op Pain Management:    Induction:   PONV Risk Score and Plan:   Airway Management Planned:   Additional Equipment:   Intra-op Plan:   Post-operative Plan:   Informed Consent: I have reviewed the patients History and Physical, chart, labs and discussed the procedure including the risks, benefits and alternatives for the proposed anesthesia with the patient or authorized representative who has indicated his/her understanding and acceptance.   Dental Advisory Given  Plan Discussed with: CRNA  Anesthesia Plan Comments:         Anesthesia Quick Evaluation                                  Anesthesia Evaluation    Airway Mallampati: II  TM Distance: >3 FB Neck ROM: Full    Dental no notable dental hx.    Pulmonary asthma ,    Pulmonary exam normal breath sounds clear to  auscultation       Cardiovascular negative cardio ROS Normal cardiovascular exam Rhythm:Regular Rate:Normal     Neuro/Psych    GI/Hepatic negative GI ROS, Neg liver ROS,   Endo/Other  Hypothyroidism   Renal/GU negative Renal ROS     Musculoskeletal osteoporosis   Abdominal   Peds  Hematology   Anesthesia Other Findings   Reproductive/Obstetrics                             Anesthesia Physical Anesthesia Plan  ASA: II  Anesthesia Plan: MAC   Post-op Pain Management:    Induction: Intravenous  PONV Risk Score and Plan: TIVA  Airway Management Planned: Natural Airway  Additional Equipment:   Intra-op Plan:   Post-operative Plan: Extubation in OR  Informed Consent: I have reviewed the patients History and Physical, chart, labs and discussed the procedure including the risks, benefits and alternatives for the proposed anesthesia with the patient or authorized representative who has indicated his/her understanding and acceptance.   Dental advisory given  Plan Discussed with: CRNA  Anesthesia Plan Comments:         Anesthesia Quick Evaluation

## 2018-02-20 DIAGNOSIS — M810 Age-related osteoporosis without current pathological fracture: Secondary | ICD-10-CM | POA: Diagnosis not present

## 2018-02-20 DIAGNOSIS — S92351D Displaced fracture of fifth metatarsal bone, right foot, subsequent encounter for fracture with routine healing: Secondary | ICD-10-CM | POA: Diagnosis not present

## 2018-02-22 DIAGNOSIS — Z681 Body mass index (BMI) 19 or less, adult: Secondary | ICD-10-CM | POA: Diagnosis not present

## 2018-02-22 DIAGNOSIS — C4359 Malignant melanoma of other part of trunk: Secondary | ICD-10-CM | POA: Diagnosis not present

## 2018-02-24 DIAGNOSIS — H02053 Trichiasis without entropian right eye, unspecified eyelid: Secondary | ICD-10-CM | POA: Diagnosis not present

## 2018-03-01 ENCOUNTER — Other Ambulatory Visit: Payer: Self-pay | Admitting: Internal Medicine

## 2018-03-03 DIAGNOSIS — W57XXXA Bitten or stung by nonvenomous insect and other nonvenomous arthropods, initial encounter: Secondary | ICD-10-CM | POA: Diagnosis not present

## 2018-03-03 DIAGNOSIS — S60469A Insect bite (nonvenomous) of unspecified finger, initial encounter: Secondary | ICD-10-CM | POA: Diagnosis not present

## 2018-03-03 DIAGNOSIS — L089 Local infection of the skin and subcutaneous tissue, unspecified: Secondary | ICD-10-CM | POA: Diagnosis not present

## 2018-03-06 ENCOUNTER — Other Ambulatory Visit: Payer: Self-pay | Admitting: Nurse Practitioner

## 2018-03-06 DIAGNOSIS — N6310 Unspecified lump in the right breast, unspecified quadrant: Secondary | ICD-10-CM

## 2018-03-30 ENCOUNTER — Ambulatory Visit (INDEPENDENT_AMBULATORY_CARE_PROVIDER_SITE_OTHER): Payer: Medicare Other | Admitting: Internal Medicine

## 2018-03-30 ENCOUNTER — Encounter: Payer: Self-pay | Admitting: Internal Medicine

## 2018-03-30 VITALS — BP 130/80 | HR 78 | Resp 16 | Ht 62.5 in | Wt 101.0 lb

## 2018-03-30 DIAGNOSIS — J449 Chronic obstructive pulmonary disease, unspecified: Secondary | ICD-10-CM | POA: Diagnosis not present

## 2018-03-30 DIAGNOSIS — Z9981 Dependence on supplemental oxygen: Secondary | ICD-10-CM | POA: Diagnosis not present

## 2018-03-30 DIAGNOSIS — G4730 Sleep apnea, unspecified: Secondary | ICD-10-CM | POA: Diagnosis not present

## 2018-03-30 DIAGNOSIS — R0602 Shortness of breath: Secondary | ICD-10-CM

## 2018-03-30 NOTE — Progress Notes (Signed)
[facility] [place], [place] [address]  Pulmonary Sleep Medicine   Office Visit Note  Patient Name: Sabrina Zamora DOB: 07-May-1931 MRN [id number]  Date of Service: 03/30/2018  Complaints/HPI: She is here for her annual follow-up.  She is doing fairly well denies having any exacerbations of her breathing issues.  She has not been in the hospital.  Denies having any chest pain no palpitations.  Denies having any cough or congestion.  She is not using her inhalers other than as needed.  Right now is comfortable without distress  ROS  General: (-) fever, (-) chills, (-) night sweats, (-) weakness Skin: (-) rashes, (-) itching,. Eyes: (-) visual changes, (-) redness, (-) itching. Nose and Sinuses: (-) nasal stuffiness or itchiness, (-) postnasal drip, (-) nosebleeds, (-) sinus trouble. Mouth and Throat: (-) sore throat, (-) hoarseness. Neck: (-) swollen glands, (-) enlarged thyroid, (-) neck pain. Respiratory: - cough, (-) bloody sputum, - shortness of breath, - wheezing. Cardiovascular: - ankle swelling, (-) chest pain. Lymphatic: (-) lymph node enlargement. Neurologic: (-) numbness, (-) tingling. Psychiatric: (-) anxiety, (-) depression   Current Medication: Outpatient Encounter Medications as of 03/30/2018  Medication Sig Note  . ADVAIR DISKUS 250-50 MCG/DOSE AEPB TAKE 1 PUFF TWICE A DAY   . amLODipine (NORVASC) 2.5 MG tablet Take 2.5 mg by mouth daily.   Marland Kitchen aspirin EC 81 MG tablet Take 81 mg by mouth. 02/04/2017: Takes every other day.  . Cholecalciferol (VITAMIN D PO) Take by mouth.   . conjugated estrogens (PREMARIN) vaginal cream Place 1 Applicatorful vaginally daily. Use pea sized amount M-W-Fr before bedtime   . Fluticasone-Salmeterol (ADVAIR) 250-50 MCG/DOSE AEPB Inhale into the lungs. 05/06/2015: Received from: [facility]  . levothyroxine (SYNTHROID, LEVOTHROID) 25 MCG tablet Take 25 mcg by mouth daily.  05/06/2015: Received from: [facility]  . lovastatin (MEVACOR) 20 MG tablet TAKE ONE TABLET BY MOUTH AT BEDTIME   . Multiple Vitamin (MULTI-VITAMINS) TABS Take by mouth. 05/06/2015: Received from: [facility]  . omeprazole (PRILOSEC) 20 MG capsule Take 20 mg by mouth every other day.  05/06/2015: Received from: [facility]  . Probiotic Product (PROBIOTIC DAILY PO) Take by mouth daily.    No facility-administered encounter medications on file as of 03/30/2018.     Surgical History: Past Surgical History:  Procedure Laterality Date  . BREAST BIOPSY Left 2003   neg  . BREAST BIOPSY Right 09/27/2017   US biopsy of 2 areas  . BREAST EXCISIONAL BIOPSY Left 1988   excisional - neg  . CATARACT EXTRACTION W/PHACO Left 09/20/2017   Procedure: CATARACT EXTRACTION PHACO AND INTRAOCULAR LENS PLACEMENT ([place]) LEFT;  Surgeon: Leandrew Koyanagi, MD;  Location: [place];  Service: Ophthalmology;  Laterality: Left;  . CATARACT EXTRACTION W/PHACO Right 02/15/2018   Procedure: CATARACT EXTRACTION PHACO AND INTRAOCULAR LENS PLACEMENT ([place]) TOPICAL  RIGHT;  Surgeon: Leandrew Koyanagi, MD;  Location: [facility];  Service: Ophthalmology;  Laterality: Right;  prefers early  . CHOLECYSTECTOMY      Medical History: Past Medical History:  Diagnosis Date  . Asthma   . Hypothyroidism   . Osteoporosis   . Skin cancer 2012, 2016    Family History: Family History  Problem Relation Age of Onset  . Clotting disorder Sister   . Breast cancer Sister 15    Social History: Social History   Socioeconomic History  . Marital status: Married    Spouse name: Not on file  .  Number of children: Not on file  . Years of education: Not on file  . Highest education level: Not on file  Occupational History  . Not on file  Social Needs  . Financial resource strain: Not on file  . Food insecurity:    Worry: Not on file    Inability: Not on file  . Transportation needs:    Medical: Not on file    Non-medical: Not on  file  Tobacco Use  . Smoking status: Never Smoker  . Smokeless tobacco: Never Used  Substance and Sexual Activity  . Alcohol use: Never    Alcohol/week: 0.0 standard drinks    Frequency: Never  . Drug use: Never  . Sexual activity: Not on file  Lifestyle  . Physical activity:    Days per week: Not on file    Minutes per session: Not on file  . Stress: Not on file  Relationships  . Social connections:    Talks on phone: Not on file    Gets together: Not on file    Attends religious service: Not on file    Active member of club or organization: Not on file    Attends meetings of clubs or organizations: Not on file    Relationship status: Not on file  . Intimate partner violence:    Fear of current or ex partner: Not on file    Emotionally abused: Not on file    Physically abused: Not on file    Forced sexual activity: Not on file  Other Topics Concern  . Not on file  Social History Narrative  . Not on file    Vital Signs: Blood pressure 130/80, pulse 78, resp. rate 16, height 5' 2.5" (1.588 m), weight 101 lb (45.8 kg), SpO2 97 %.  Examination: General Appearance: The patient is well-developed, well-nourished, and in no distress. Skin: Gross inspection of skin unremarkable. Head: normocephalic, no gross deformities. Eyes: no gross deformities noted. ENT: ears appear grossly normal no exudates. Neck: Supple. No thyromegaly. No LAD. Respiratory: no rhonchi noted at this time. Cardiovascular: Normal S1 and S2 without murmur or rub. Extremities: No cyanosis. pulses are equal. Neurologic: Alert and oriented. No involuntary movements.  LABS: Recent Results (from the past 2160 hour(s))  CBC with Differential/Platelet     Status: Abnormal   Collection Time: 02/06/18  1:22 PM  Result Value Ref Range   WBC 6.7 3.6 - 11.0 K/uL   RBC 5.10 3.80 - 5.20 MIL/uL   Hemoglobin 16.2 (H) 12.0 - 16.0 g/dL   HCT 48.3 (H) 35.0 - 47.0 %   MCV 94.6 80.0 - 100.0 fL   MCH 31.7 26.0 - 34.0  pg   MCHC 33.5 32.0 - 36.0 g/dL   RDW 14.3 11.5 - 14.5 %   Platelets 215 150 - 440 K/uL   Neutrophils Relative % 54 %   Neutro Abs 3.7 1.4 - 6.5 K/uL   Lymphocytes Relative 32 %   Lymphs Abs 2.1 1.0 - 3.6 K/uL   Monocytes Relative 11 %   Monocytes Absolute 0.7 0.2 - 0.9 K/uL   Eosinophils Relative 2 %   Eosinophils Absolute 0.1 0 - 0.7 K/uL   Basophils Relative 1 %   Basophils Absolute 0.1 0 - 0.1 K/uL    Comment: Performed at [facility], [address], [place], [place] [address]    Radiology: No results found.  No results found.  No results found.    Assessment and Plan: Patient Active  Problem List   Diagnosis Date Noted  . Anxiety 10/31/2017  . Fibrocystic breast disease 10/31/2017  . History of melanoma 10/31/2017  . Sleep apnea 10/31/2017  . Hypothyroidism 10/31/2017  . Benign hypertension 10/31/2017  . Urinary tract infection with hematuria 10/31/2017  . Dysuria 10/31/2017  . Erythrocytosis 05/06/2015  . Melanoma of trunk ([place]) 02/05/2015  . Osteoporosis 02/19/2014    1. COPD mild by Arlyce Harman continue with present management doing well we will continue to follow on a annual basis. 2. Oxygen not using during the day she will use during the night 3. Sleep Apnea on oxygen therapy  General Counseling: I have discussed the findings of the evaluation and examination with Cipriana.  I have also discussed any further diagnostic evaluation thatmay be needed or ordered today. Rolonda verbalizes understanding of the findings of todays visit. We also reviewed her medications today and discussed drug interactions and side effects including but not limited excessive drowsiness and altered mental states. We also discussed that there is always a risk not just to her but also people around her. she has been encouraged to call the office with any questions or concerns that should arise related to todays visit.    Time spent: 15 minutes  I have personally obtained a history,  examined the patient, evaluated laboratory and imaging results, formulated the assessment and plan and placed orders.    Allyne Gee, MD [facility] Pulmonary and Critical Care Sleep medicine

## 2018-03-30 NOTE — Patient Instructions (Signed)

## 2018-03-31 ENCOUNTER — Ambulatory Visit
Admission: RE | Admit: 2018-03-31 | Discharge: 2018-03-31 | Disposition: A | Discharge status: Home / Self Care | Payer: Medicare Other | Source: Ambulatory Visit | Attending: Nurse Practitioner | Admitting: Nurse Practitioner

## 2018-03-31 DIAGNOSIS — R922 Inconclusive mammogram: Secondary | ICD-10-CM | POA: Diagnosis not present

## 2018-03-31 DIAGNOSIS — N6310 Unspecified lump in the right breast, unspecified quadrant: Secondary | ICD-10-CM

## 2018-04-19 ENCOUNTER — Other Ambulatory Visit: Payer: Self-pay | Admitting: Internal Medicine

## 2018-04-20 ENCOUNTER — Other Ambulatory Visit: Payer: Self-pay

## 2018-04-20 DIAGNOSIS — M545 Low back pain: Secondary | ICD-10-CM | POA: Diagnosis not present

## 2018-04-20 MED ORDER — LEVOTHYROXINE SODIUM 25 MCG PO TABS: ORAL_TABLET | ORAL | 1 refills | Status: DC

## 2018-05-02 DIAGNOSIS — Z23 Encounter for immunization: Secondary | ICD-10-CM | POA: Diagnosis not present

## 2018-05-04 ENCOUNTER — Ambulatory Visit (INDEPENDENT_AMBULATORY_CARE_PROVIDER_SITE_OTHER): Payer: Medicare Other | Admitting: Nurse Practitioner

## 2018-05-04 ENCOUNTER — Encounter: Payer: Self-pay | Admitting: Nurse Practitioner

## 2018-05-04 VITALS — BP 142/80 | HR 77 | Resp 16 | Ht 62.5 in | Wt 100.8 lb

## 2018-05-04 DIAGNOSIS — E559 Vitamin D deficiency, unspecified: Secondary | ICD-10-CM | POA: Insufficient documentation

## 2018-05-04 DIAGNOSIS — R300 Dysuria: Secondary | ICD-10-CM

## 2018-05-04 DIAGNOSIS — E039 Hypothyroidism, unspecified: Secondary | ICD-10-CM | POA: Diagnosis not present

## 2018-05-04 DIAGNOSIS — Z0001 Encounter for general adult medical examination with abnormal findings: Secondary | ICD-10-CM

## 2018-05-04 DIAGNOSIS — I10 Essential (primary) hypertension: Secondary | ICD-10-CM

## 2018-05-04 DIAGNOSIS — D751 Secondary polycythemia: Secondary | ICD-10-CM | POA: Diagnosis not present

## 2018-05-04 DIAGNOSIS — Z1239 Encounter for other screening for malignant neoplasm of breast: Secondary | ICD-10-CM | POA: Diagnosis not present

## 2018-05-04 DIAGNOSIS — Z8249 Family history of ischemic heart disease and other diseases of the circulatory system: Secondary | ICD-10-CM

## 2018-05-04 NOTE — Progress Notes (Signed)
[facility], [place] [address]  Internal MEDICINE  Office Visit Note  Patient Name: Sabrina Zamora  [id number]  [id number]  Date of Service: 05/04/2018   Pt is here for routine health maintenance examination  Chief Complaint  Patient presents with  . Medicare Wellness    5month well visit  . Hypertension     The patient is here for health maintenance exam. Her blood pressure is elevated a bit. Generally elevated when she first arrives. Feels good. Did have follow up diagnostic mammogram on the right side. This was benign. She does see urology for her persistent UTI. Not currently on medication for uti but not currently c/o flank pain, frequency, or blood in her urine. She got her flu shot this past Monday at her pharmacy. She is due to have routine, fating labs checked.     Current Medication: Outpatient Encounter Medications as of 05/04/2018  Medication Sig Note  . ADVAIR DISKUS 250-50 MCG/DOSE AEPB TAKE 1 PUFF TWICE A DAY   . amLODipine (NORVASC) 2.5 MG tablet Take 2.5 mg by mouth daily.   Marland Kitchen aspirin EC 81 MG tablet Take 81 mg by mouth. 02/04/2017: Takes every other day.  . Cholecalciferol (VITAMIN D PO) Take by mouth.   . conjugated estrogens (PREMARIN) vaginal cream Place 1 Applicatorful vaginally daily. Use pea sized amount M-W-Fr before bedtime   . Fluticasone-Salmeterol (ADVAIR) 250-50 MCG/DOSE AEPB Inhale into the lungs. 05/06/2015: Received from: [facility]  . levothyroxine (SYNTHROID, LEVOTHROID) 25 MCG tablet Take 1.5 tab po Daily before breakfast   . lovastatin (MEVACOR) 20 MG tablet TAKE ONE TABLET BY MOUTH AT BEDTIME   . Multiple Vitamin (MULTI-VITAMINS) TABS Take by mouth. 05/06/2015: Received from: [facility]  . omeprazole (PRILOSEC) 20 MG capsule Take 20 mg by mouth every other day.  05/06/2015: Received from: [facility]  . Probiotic Product (PROBIOTIC DAILY PO) Take by mouth daily.   . [DISCONTINUED] tiZANidine  (ZANAFLEX) 2 MG tablet     No facility-administered encounter medications on file as of 05/04/2018.     Surgical History: Past Surgical History:  Procedure Laterality Date  . BREAST BIOPSY Left 2003   neg  . BREAST BIOPSY Right 09/27/2017   12:00 1 cmfn fibroadenomatous change   . BREAST BIOPSY Right 09/17/2017   12:00 3 cmfn benign breast and fibroadipose tissue  . BREAST EXCISIONAL BIOPSY Left 1988   excisional - neg  . CATARACT EXTRACTION W/PHACO Left 09/20/2017   Procedure: CATARACT EXTRACTION PHACO AND INTRAOCULAR LENS PLACEMENT ([place]) LEFT;  Surgeon: Leandrew Koyanagi, MD;  Location: [facility];  Service: Ophthalmology;  Laterality: Left;  . CATARACT EXTRACTION W/PHACO Right 02/15/2018   Procedure: CATARACT EXTRACTION PHACO AND INTRAOCULAR LENS PLACEMENT ([place]) TOPICAL  RIGHT;  Surgeon: Leandrew Koyanagi, MD;  Location: [facility];  Service: Ophthalmology;  Laterality: Right;  prefers early  . CHOLECYSTECTOMY      Medical History: Past Medical History:  Diagnosis Date  . Asthma   . Hypothyroidism   . Osteoporosis   . Skin cancer 2012, 2016    Family History: Family History  Problem Relation Age of Onset  . Clotting disorder Sister   . Breast cancer Sister 55      Review of Systems  Constitutional: Negative for chills, diaphoresis, fatigue and unexpected weight change.  HENT: Negative for ear pain, postnasal drip, sinus pressure and sore throat.   Eyes: Negative for photophobia, discharge, redness, itching and visual disturbance.  Respiratory: Negative for cough, shortness of breath and wheezing.   Cardiovascular: Negative for chest pain, palpitations and leg swelling.       Elevated blood pressure.  Gastrointestinal: Negative for abdominal pain, constipation, diarrhea, nausea and vomiting.  Endocrine: Negative for cold intolerance, heat intolerance, polydipsia, polyphagia and polyuria.  Genitourinary: Negative for dysuria, flank pain,  frequency and urgency.  Musculoskeletal: Negative for arthralgias, back pain, gait problem and neck pain.  Skin: Negative for color change and rash.  Allergic/Immunologic: Negative for environmental allergies and food allergies.  Neurological: Negative for dizziness, syncope and headaches.  Hematological: Does not bruise/bleed easily.  Psychiatric/Behavioral: Negative for agitation, behavioral problems (depression) and hallucinations. The patient is not nervous/anxious.      Today's Vitals   05/04/18 0912  BP: (!) 142/80  Pulse: 77  Resp: 16  SpO2: 97%  Weight: 100 lb 12.8 oz (45.7 kg)  Height: 5' 2.5" (1.588 m)    Physical Exam  Constitutional: She is oriented to person, place, and time. She appears well-developed and well-nourished. No distress.  HENT:  Head: Normocephalic and atraumatic.  Nose: Nose normal.  Mouth/Throat: Oropharynx is clear and moist. No oropharyngeal exudate.  Eyes: Pupils are equal, round, and reactive to light. Conjunctivae and EOM are normal.  Neck: Normal range of motion. Neck supple. No JVD present. Carotid bruit is not present. No tracheal deviation present. No thyromegaly present.  Cardiovascular: Normal rate, regular rhythm, normal heart sounds and intact distal pulses. Exam reveals no gallop and no friction rub.  No murmur heard. Pulmonary/Chest: Effort normal and breath sounds normal. No respiratory distress. She has no rales. She exhibits no tenderness. Right breast exhibits no inverted nipple, no mass, no nipple discharge, no skin change and no tenderness. Left breast exhibits no inverted nipple, no mass, no nipple discharge, no skin change and no tenderness.  Abdominal: Soft. Bowel sounds are normal. There is no tenderness.  Musculoskeletal: Normal range of motion.  Lymphadenopathy:    She has no cervical adenopathy.  Neurological: She is alert and oriented to person, place, and time. No cranial nerve deficit.  Skin: Skin is warm and dry.  Capillary refill takes 2 to 3 seconds. She is not diaphoretic.  Psychiatric: She has a normal mood and affect. Her behavior is normal. Judgment and thought content normal.  Nursing note and vitals reviewed.  Depression screen [facility] 2/9 05/04/2018 01/06/2018 10/31/2017  Decreased Interest 0 0 0  Down, Depressed, Hopeless 0 0 0  PHQ - 2 Score 0 0 0    Functional Status Survey: Is the patient deaf or have difficulty hearing?: Yes Does the patient have difficulty seeing, even when wearing glasses/contacts?: No Does the patient have difficulty concentrating, remembering, or making decisions?: No Does the patient have difficulty walking or climbing stairs?: No Does the patient have difficulty dressing or bathing?: No Does the patient have difficulty doing errands alone such as visiting a doctor's office or shopping?: No  MMSE - [place] Exam 05/04/2018  Orientation to time 5  Orientation to Place 5  Registration 3  Attention/ Calculation 5  Recall 3  Language- name 2 objects 2  Language- repeat 1  Language- follow 3 step command 3  Language- read & follow direction 1  Write a sentence 1  Copy design 1  Total score 30    Fall Risk  05/04/2018 01/06/2018 10/31/2017 02/04/2017  Falls in the past year? No No No No  Comment - - - Emmi Telephone Survey: data to  providers prior to load      LABS: Recent Results (from the past 2160 hour(s))  CBC with Differential/Platelet     Status: Abnormal   Collection Time: 02/06/18  1:22 PM  Result Value Ref Range   WBC 6.7 3.6 - 11.0 K/uL   RBC 5.10 3.80 - 5.20 MIL/uL   Hemoglobin 16.2 (H) 12.0 - 16.0 g/dL   HCT 48.3 (H) 35.0 - 47.0 %   MCV 94.6 80.0 - 100.0 fL   MCH 31.7 26.0 - 34.0 pg   MCHC 33.5 32.0 - 36.0 g/dL   RDW 14.3 11.5 - 14.5 %   Platelets 215 150 - 440 K/uL   Neutrophils Relative % 54 %   Neutro Abs 3.7 1.4 - 6.5 K/uL   Lymphocytes Relative 32 %   Lymphs Abs 2.1 1.0 - 3.6 K/uL   Monocytes Relative 11 %   Monocytes  Absolute 0.7 0.2 - 0.9 K/uL   Eosinophils Relative 2 %   Eosinophils Absolute 0.1 0 - 0.7 K/uL   Basophils Relative 1 %   Basophils Absolute 0.1 0 - 0.1 K/uL    Comment: Performed at [facility], [address], [place], [place] [address]   Assessment/Plan: 1. Encounter for general adult medical examination with abnormal findings Annual health maintenance exam today  2. Benign hypertension Generally stable. Continue bp medication as prescribed.  - Comprehensive metabolic panel - Lipid panel  3. Erythrocytosis Check labs. Continue regular visits with hematology as scheduled  - CBC with Differential/Platelet - Comprehensive metabolic panel - Lipid panel  4. Acquired hypothyroidism - T4, free - TSH  5. Family history of coronary artery disease - EKG 12-Lead showing some left atrial enlargement without acute abnormalities. Will monitor yearly and as needed.   6. Dysuria - UA/M w/rflx Culture, Routine  7. Screening for breast cancer Yearly, screening mammogram should be scheduled for 09/2018  8. Vitamin D deficiency - Vitamin D 1,25 dihydroxy  General Counseling: Morgana verbalizes understanding of the findings of todays visit and agrees with plan of treatment. I have discussed any further diagnostic evaluation that may be needed or ordered today. We also reviewed her medications today. she has been encouraged to call the office with any questions or concerns that should arise related to todays visit.    Counseling:  Cardiac risk factor modification:  1. Control blood pressure. 2. Exercise as prescribed. 3. Follow low sodium, low fat diet. and low fat and low cholestrol diet. 4. Take ASA 81mg  once a day. 5. Restricted calories diet to lose weight.  This patient was seen by Leretha Pol FNP Collaboration with Dr Lavera Guise as a part of collaborative care agreement  Orders Placed This Encounter  Procedures  . UA/M w/rflx Culture, Routine  . CBC with  Differential/Platelet  . Comprehensive metabolic panel  . T4, free  . TSH  . Lipid panel  . Vitamin D 1,25 dihydroxy  . EKG 12-Lead      Time spent: [place], MD  Internal Medicine

## 2018-05-05 ENCOUNTER — Other Ambulatory Visit: Payer: Self-pay | Admitting: Nurse Practitioner

## 2018-05-05 DIAGNOSIS — R922 Inconclusive mammogram: Secondary | ICD-10-CM

## 2018-05-05 DIAGNOSIS — Z1231 Encounter for screening mammogram for malignant neoplasm of breast: Secondary | ICD-10-CM

## 2018-05-05 LAB — UA/M W/RFLX CULTURE, ROUTINE
Bilirubin, UA: NEGATIVE
Glucose, UA: NEGATIVE
KETONES UA: NEGATIVE
Leukocytes, UA: NEGATIVE
NITRITE UA: NEGATIVE
PROTEIN UA: NEGATIVE
RBC UA: NEGATIVE
SPEC GRAV UA: 1.007 (ref 1.005–1.030)
UUROB: 0.2 mg/dL (ref 0.2–1.0)
pH, UA: 7 (ref 5.0–7.5)

## 2018-05-05 LAB — MICROSCOPIC EXAMINATION: Casts: NONE SEEN /lpf

## 2018-05-10 DIAGNOSIS — D751 Secondary polycythemia: Secondary | ICD-10-CM | POA: Diagnosis not present

## 2018-05-10 DIAGNOSIS — E559 Vitamin D deficiency, unspecified: Secondary | ICD-10-CM | POA: Diagnosis not present

## 2018-05-10 DIAGNOSIS — E039 Hypothyroidism, unspecified: Secondary | ICD-10-CM | POA: Diagnosis not present

## 2018-05-10 DIAGNOSIS — I10 Essential (primary) hypertension: Secondary | ICD-10-CM | POA: Diagnosis not present

## 2018-05-12 ENCOUNTER — Telehealth: Payer: Self-pay

## 2018-05-12 NOTE — Telephone Encounter (Signed)
P advised for labs and mailed copy labs

## 2018-05-15 LAB — CBC WITH DIFFERENTIAL/PLATELET
Basophils Absolute: 0.1 10*3/uL (ref 0.0–0.2)
Basos: 2 %
EOS (ABSOLUTE): 0.1 10*3/uL (ref 0.0–0.4)
Eos: 2 %
HEMATOCRIT: 50.8 % — AB (ref 34.0–46.6)
Hemoglobin: 16.8 g/dL — ABNORMAL HIGH (ref 11.1–15.9)
Immature Grans (Abs): 0 10*3/uL (ref 0.0–0.1)
Immature Granulocytes: 0 %
LYMPHS ABS: 2.4 10*3/uL (ref 0.7–3.1)
Lymphs: 46 %
MCH: 31.2 pg (ref 26.6–33.0)
MCHC: 33.1 g/dL (ref 31.5–35.7)
MCV: 94 fL (ref 79–97)
MONOS ABS: 0.6 10*3/uL (ref 0.1–0.9)
Monocytes: 12 %
NEUTROS ABS: 2 10*3/uL (ref 1.4–7.0)
Neutrophils: 38 %
PLATELETS: 241 10*3/uL (ref 150–450)
RBC: 5.39 x10E6/uL — AB (ref 3.77–5.28)
RDW: 12.5 % (ref 12.3–15.4)
WBC: 5.2 10*3/uL (ref 3.4–10.8)

## 2018-05-15 LAB — VITAMIN D 1,25 DIHYDROXY
VITAMIN D 1, 25 (OH) TOTAL: 53 pg/mL
Vitamin D3 1, 25 (OH)2: 52 pg/mL

## 2018-05-15 LAB — COMPREHENSIVE METABOLIC PANEL
ALK PHOS: 54 IU/L (ref 39–117)
ALT: 26 IU/L (ref 0–32)
AST: 34 IU/L (ref 0–40)
Albumin/Globulin Ratio: 1.7 (ref 1.2–2.2)
Albumin: 4.7 g/dL (ref 3.5–4.7)
BUN/Creatinine Ratio: 23 (ref 12–28)
BUN: 19 mg/dL (ref 8–27)
Bilirubin Total: 0.5 mg/dL (ref 0.0–1.2)
CO2: 23 mmol/L (ref 20–29)
CREATININE: 0.83 mg/dL (ref 0.57–1.00)
Calcium: 9.7 mg/dL (ref 8.7–10.3)
Chloride: 102 mmol/L (ref 96–106)
GFR calc Af Amer: 74 mL/min/{1.73_m2} (ref >59)
GFR calc non Af Amer: 65 mL/min/{1.73_m2} (ref >59)
GLOBULIN, TOTAL: 2.8 g/dL (ref 1.5–4.5)
Glucose: 91 mg/dL (ref 65–99)
Potassium: 4.9 mmol/L (ref 3.5–5.2)
SODIUM: 145 mmol/L — AB (ref 134–144)
Total Protein: 7.5 g/dL (ref 6.0–8.5)

## 2018-05-15 LAB — LIPID PANEL
Chol/HDL Ratio: 2.7 ratio (ref 0.0–4.4)
Cholesterol, Total: 210 mg/dL — ABNORMAL HIGH (ref 100–199)
HDL: 78 mg/dL (ref >39)
LDL Calculated: 116 mg/dL — ABNORMAL HIGH (ref 0–99)
Triglycerides: 79 mg/dL (ref 0–149)
VLDL Cholesterol Cal: 16 mg/dL (ref 5–40)

## 2018-05-15 LAB — TSH: TSH: 3.86 u[IU]/mL (ref 0.450–4.500)

## 2018-05-15 LAB — T4, FREE: Free T4: 1.55 ng/dL (ref 0.82–1.77)

## 2018-05-15 LAB — T3: T3 TOTAL: 90 ng/dL (ref 71–180)

## 2018-05-22 ENCOUNTER — Other Ambulatory Visit: Payer: Self-pay | Admitting: Hematology and Oncology

## 2018-05-22 ENCOUNTER — Telehealth: Payer: Self-pay | Admitting: Hematology and Oncology

## 2018-05-22 DIAGNOSIS — D751 Secondary polycythemia: Secondary | ICD-10-CM

## 2018-05-22 NOTE — Telephone Encounter (Signed)
Re:  Elevated hemoglobin  Patient contacted office concerned regarding an elevated hemoglobin (16.8) and hematocrit (50.8) on 05/10/2018.  We discussed level above her typical cut off for phlebotomy (HCT > 50).  Discussed repeat CBC at the [facility].  She will be contacted for a lab appointment and possible phlebotomy.   Lequita Asal, MD

## 2018-05-24 ENCOUNTER — Inpatient Hospital Stay: Payer: Medicare Other

## 2018-05-24 ENCOUNTER — Inpatient Hospital Stay: Payer: Medicare Other | Attending: Hematology and Oncology

## 2018-05-24 VITALS — BP 150/84 | HR 72 | Temp 98.0°F | Resp 20

## 2018-05-24 DIAGNOSIS — D45 Polycythemia vera: Secondary | ICD-10-CM | POA: Insufficient documentation

## 2018-05-24 DIAGNOSIS — D751 Secondary polycythemia: Secondary | ICD-10-CM

## 2018-05-24 LAB — CBC WITH DIFFERENTIAL/PLATELET
Abs Immature Granulocytes: 0.03 10*3/uL (ref 0.00–0.07)
BASOS ABS: 0.1 10*3/uL (ref 0.0–0.1)
Basophils Relative: 1 %
EOS ABS: 0.1 10*3/uL (ref 0.0–0.5)
Eosinophils Relative: 1 %
HEMATOCRIT: 50.1 % — AB (ref 36.0–46.0)
Hemoglobin: 16.4 g/dL — ABNORMAL HIGH (ref 12.0–15.0)
IMMATURE GRANULOCYTES: 0 %
LYMPHS ABS: 2.3 10*3/uL (ref 0.7–4.0)
LYMPHS PCT: 33 %
MCH: 31.2 pg (ref 26.0–34.0)
MCHC: 32.7 g/dL (ref 30.0–36.0)
MCV: 95.4 fL (ref 80.0–100.0)
Monocytes Absolute: 0.8 10*3/uL (ref 0.1–1.0)
Monocytes Relative: 11 %
NEUTROS PCT: 54 %
NRBC: 0 % (ref 0.0–0.2)
Neutro Abs: 3.9 10*3/uL (ref 1.7–7.7)
Platelets: 223 10*3/uL (ref 150–400)
RBC: 5.25 MIL/uL — ABNORMAL HIGH (ref 3.87–5.11)
RDW: 12.9 % (ref 11.5–15.5)
WBC: 7.1 10*3/uL (ref 4.0–10.5)

## 2018-06-12 ENCOUNTER — Other Ambulatory Visit: Payer: Self-pay | Admitting: Nurse Practitioner

## 2018-06-12 DIAGNOSIS — R928 Other abnormal and inconclusive findings on diagnostic imaging of breast: Secondary | ICD-10-CM

## 2018-06-14 ENCOUNTER — Other Ambulatory Visit: Payer: Self-pay

## 2018-06-14 DIAGNOSIS — Z08 Encounter for follow-up examination after completed treatment for malignant neoplasm: Secondary | ICD-10-CM | POA: Diagnosis not present

## 2018-06-14 DIAGNOSIS — D2272 Melanocytic nevi of left lower limb, including hip: Secondary | ICD-10-CM | POA: Diagnosis not present

## 2018-06-14 DIAGNOSIS — D2262 Melanocytic nevi of left upper limb, including shoulder: Secondary | ICD-10-CM | POA: Diagnosis not present

## 2018-06-14 DIAGNOSIS — Z872 Personal history of diseases of the skin and subcutaneous tissue: Secondary | ICD-10-CM | POA: Diagnosis not present

## 2018-06-14 DIAGNOSIS — Z85828 Personal history of other malignant neoplasm of skin: Secondary | ICD-10-CM | POA: Diagnosis not present

## 2018-06-14 DIAGNOSIS — Z85820 Personal history of malignant melanoma of skin: Secondary | ICD-10-CM | POA: Diagnosis not present

## 2018-06-14 DIAGNOSIS — L821 Other seborrheic keratosis: Secondary | ICD-10-CM | POA: Diagnosis not present

## 2018-06-14 DIAGNOSIS — L570 Actinic keratosis: Secondary | ICD-10-CM | POA: Diagnosis not present

## 2018-06-14 DIAGNOSIS — Z09 Encounter for follow-up examination after completed treatment for conditions other than malignant neoplasm: Secondary | ICD-10-CM | POA: Diagnosis not present

## 2018-06-14 MED ORDER — AMLODIPINE BESYLATE 2.5 MG PO TABS: ORAL_TABLET | ORAL | 5 refills | Status: DC

## 2018-07-18 ENCOUNTER — Other Ambulatory Visit: Payer: Self-pay

## 2018-07-18 MED ORDER — LOVASTATIN 20 MG PO TABS: 20.0000 mg | ORAL_TABLET | Freq: Every day | ORAL | 2 refills | Status: DC

## 2018-07-19 DIAGNOSIS — H40013 Open angle with borderline findings, low risk, bilateral: Secondary | ICD-10-CM | POA: Diagnosis not present

## 2018-07-19 DIAGNOSIS — Z961 Presence of intraocular lens: Secondary | ICD-10-CM | POA: Diagnosis not present

## 2018-08-10 ENCOUNTER — Other Ambulatory Visit: Payer: Self-pay | Admitting: Urgent Care

## 2018-08-10 ENCOUNTER — Inpatient Hospital Stay: Payer: Medicare Other | Attending: Oncology

## 2018-08-10 ENCOUNTER — Inpatient Hospital Stay: Payer: Medicare Other

## 2018-08-10 DIAGNOSIS — D751 Secondary polycythemia: Secondary | ICD-10-CM

## 2018-08-10 LAB — CBC WITH DIFFERENTIAL/PLATELET
ABS IMMATURE GRANULOCYTES: 0.03 10*3/uL (ref 0.00–0.07)
Basophils Absolute: 0.1 10*3/uL (ref 0.0–0.1)
Basophils Relative: 1 %
EOS PCT: 1 %
Eosinophils Absolute: 0.1 10*3/uL (ref 0.0–0.5)
HEMATOCRIT: 49.2 % — AB (ref 36.0–46.0)
Hemoglobin: 15.9 g/dL — ABNORMAL HIGH (ref 12.0–15.0)
Immature Granulocytes: 0 %
Lymphocytes Relative: 27 %
Lymphs Abs: 1.9 10*3/uL (ref 0.7–4.0)
MCH: 31.1 pg (ref 26.0–34.0)
MCHC: 32.3 g/dL (ref 30.0–36.0)
MCV: 96.1 fL (ref 80.0–100.0)
MONO ABS: 0.6 10*3/uL (ref 0.1–1.0)
Monocytes Relative: 9 %
NEUTROS ABS: 4.3 10*3/uL (ref 1.7–7.7)
Neutrophils Relative %: 62 %
Platelets: 199 10*3/uL (ref 150–400)
RBC: 5.12 MIL/uL — AB (ref 3.87–5.11)
RDW: 12.9 % (ref 11.5–15.5)
WBC: 7 10*3/uL (ref 4.0–10.5)
nRBC: 0 % (ref 0.0–0.2)

## 2018-08-23 ENCOUNTER — Other Ambulatory Visit: Payer: Self-pay

## 2018-08-23 DIAGNOSIS — D751 Secondary polycythemia: Secondary | ICD-10-CM

## 2018-08-25 ENCOUNTER — Inpatient Hospital Stay: Payer: Medicare Other | Attending: Hematology and Oncology | Admitting: Hematology and Oncology

## 2018-08-25 ENCOUNTER — Other Ambulatory Visit: Payer: Self-pay | Admitting: Hematology and Oncology

## 2018-08-25 ENCOUNTER — Encounter: Payer: Self-pay | Admitting: Hematology and Oncology

## 2018-08-25 ENCOUNTER — Inpatient Hospital Stay: Payer: Medicare Other

## 2018-08-25 VITALS — BP 135/75 | HR 79

## 2018-08-25 VITALS — BP 149/85 | HR 81 | Temp 97.5°F | Resp 16 | Wt 99.4 lb

## 2018-08-25 DIAGNOSIS — D751 Secondary polycythemia: Secondary | ICD-10-CM

## 2018-08-25 DIAGNOSIS — G4730 Sleep apnea, unspecified: Secondary | ICD-10-CM | POA: Diagnosis not present

## 2018-08-25 LAB — CBC WITH DIFFERENTIAL/PLATELET
Abs Immature Granulocytes: 0.04 10*3/uL (ref 0.00–0.07)
Basophils Absolute: 0.1 10*3/uL (ref 0.0–0.1)
Basophils Relative: 1 %
Eosinophils Absolute: 0.1 10*3/uL (ref 0.0–0.5)
Eosinophils Relative: 1 %
HCT: 49.9 % — ABNORMAL HIGH (ref 36.0–46.0)
Hemoglobin: 16.3 g/dL — ABNORMAL HIGH (ref 12.0–15.0)
Immature Granulocytes: 1 %
Lymphocytes Relative: 34 %
Lymphs Abs: 2.4 10*3/uL (ref 0.7–4.0)
MCH: 31 pg (ref 26.0–34.0)
MCHC: 32.7 g/dL (ref 30.0–36.0)
MCV: 95 fL (ref 80.0–100.0)
Monocytes Absolute: 0.8 10*3/uL (ref 0.1–1.0)
Monocytes Relative: 11 %
Neutro Abs: 3.7 10*3/uL (ref 1.7–7.7)
Neutrophils Relative %: 52 %
Platelets: 228 10*3/uL (ref 150–400)
RBC: 5.25 MIL/uL — ABNORMAL HIGH (ref 3.87–5.11)
RDW: 12.9 % (ref 11.5–15.5)
WBC: 7 10*3/uL (ref 4.0–10.5)
nRBC: 0 % (ref 0.0–0.2)

## 2018-08-25 LAB — FERRITIN: Ferritin: 29 ng/mL (ref 11–307)

## 2018-08-25 NOTE — Progress Notes (Signed)
[facility] day:  08/25/18  Chief Complaint: Sabrina Zamora is a 85 y.o. female with a history of secondary polycythemia who is seen for 18 month assessment.  HPI:  The patient was last seen in the medical oncology clinic on 02/04/2017.  At that time, she felt good.  Exam was unremarkable.  Hematocrit was 46.1 and hemoglobin 15.6.  She had mot required a phlebotomy since 04/2015.  Follow-up appointments were spread out.  CBC was to be checked every 6 months.  Follow-up labs: 08/08/2017:  Hematocrit of 50.5, hemoglobin 16.7, MCV 94.6, platelets 240,000, and WBC 7700.  She underwent phlebotomy. 02/06/2018:  Hematocrit of 48.3, hemoglobin 16.2, MCV 94.6, platelets 215,000, and WBC 6700. 05/24/2018:  Hematocrit of 50.1, hemoglobin 16.4, MCV 95.4, platelets 223,000, and WBC 7100.  She underwent phlebotomy. 07/24/2018:  Hematocrit of 49.2, hemoglobin 15.9, MCV 96.1, platelets 199,000, and WBC 7000.    During the interim, she has felt pretty good.  She denies any sleep apnea.  She denies any use of testosterone.  She comments that she eats very little iron containing foods since her last phlebotomy.    Past Medical History:  Diagnosis Date  . Asthma   . Hypothyroidism   . Osteoporosis   . Skin cancer 2012, 2016    Past Surgical History:  Procedure Laterality Date  . BREAST BIOPSY Left 2003   neg  . BREAST BIOPSY Right 09/27/2017   12:00 1 cmfn fibroadenomatous change   . BREAST BIOPSY Right 09/17/2017   12:00 3 cmfn benign breast and fibroadipose tissue  . BREAST EXCISIONAL BIOPSY Left 1988   excisional - neg  . CATARACT EXTRACTION W/PHACO Left 09/20/2017   Procedure: CATARACT EXTRACTION PHACO AND INTRAOCULAR LENS PLACEMENT ([place]) LEFT;  Surgeon: Leandrew Koyanagi, MD;  Location: [facility];  Service: Ophthalmology;  Laterality: Left;  . CATARACT EXTRACTION W/PHACO Right 02/15/2018   Procedure: CATARACT EXTRACTION PHACO AND  INTRAOCULAR LENS PLACEMENT ([place]) TOPICAL  RIGHT;  Surgeon: Leandrew Koyanagi, MD;  Location: [facility];  Service: Ophthalmology;  Laterality: Right;  prefers early  . CHOLECYSTECTOMY      Family History  Problem Relation Age of Onset  . Clotting disorder Sister   . Breast cancer Sister 69    Social History:  reports that she has never smoked. She has never used smokeless tobacco. She reports that she does not drink alcohol or use drugs.  She has never smoked.  She lives in [place].  The patient is alone today.  Allergies:  Allergies  Allergen Reactions  . Levofloxacin Rash and Shortness Of Breath  . Penicillins Hives and Rash  . Betaine Rash  . Ciprofloxacin Itching and Rash  . Sulfa Antibiotics Rash    Current Medications: Current Outpatient Medications  Medication Sig Dispense Refill  . amLODipine (NORVASC) 2.5 MG tablet Take 1 to 2 tab po daily for blood pressure 45 tablet 5  . aspirin EC 81 MG tablet Take 81 mg by mouth.    . Cholecalciferol (VITAMIN D PO) Take by mouth daily.     Marland Kitchen conjugated estrogens (PREMARIN) vaginal cream Place 1 Applicatorful vaginally daily. Use pea sized amount M-W-Fr before bedtime 42.5 g 3  . levothyroxine (SYNTHROID, LEVOTHROID) 25 MCG tablet Take 1.5 tab po Daily before breakfast 135 tablet 1  . lovastatin (MEVACOR) 20 MG tablet Take 1 tablet (20 mg total) by mouth at bedtime. 90 tablet 2  . Multiple Vitamin (MULTI-VITAMINS) TABS Take by  mouth.    . omeprazole (PRILOSEC) 20 MG capsule Take 20 mg by mouth every other day.     . Probiotic Product (PROBIOTIC DAILY PO) Take by mouth daily.    Marland Kitchen ADVAIR DISKUS 250-50 MCG/DOSE AEPB TAKE 1 PUFF TWICE A DAY (Patient taking differently: Inhale 1 puff into the lungs at bedtime. ) 1 each 3   No current facility-administered medications for this visit.     Review of Systems:  GENERAL:  Feels "pretty good".  No fevers, sweats.  Weight up 2 pounds since last visit. PERFORMANCE STATUS (ECOG):   1 HEENT:  No visual changes, runny nose, sore throat, mouth sores or tenderness. Lungs: No shortness of breath or cough.  No hemoptysis. Cardiac:  No chest pain, palpitations, orthopnea, or PND. GI:  No nausea, vomiting, diarrhea, constipation, melena or hematochezia. GU:  No urgency, frequency, dysuria, or hematuria. Musculoskeletal:  No back pain.  No joint pain.  No muscle tenderness. Extremities:  No pain or swelling. Skin:  No rashes or skin changes. Neuro:  No headache, numbness or weakness, balance or coordination issues. Endocrine:  Thyroid disease on Synthroid.  No hot flashes or night sweats. Psych:  No mood changes, depression or anxiety. Pain:  No focal pain. Review of systems:  All other systems reviewed and found to be negative.   Physical Exam: Blood pressure (!) 149/85, pulse 81, temperature (!) 97.5 F (36.4 C), temperature source Oral, resp. rate 16, weight 99 lb 6.4 oz (45.1 kg), SpO2 98 %. GENERAL:  Thin woman sitting comfortably in the exam room in no acute distress. MENTAL STATUS:  Alert and oriented to person, place and time. HEAD:  Styled dark blonde hair.  Normocephalic, atraumatic, face symmetric, no Cushingoid features. EYES:  Blue eyes.  Pupils equal round and reactive to light and accomodation.  No conjunctivitis or scleral icterus. ENT:  Oropharynx clear without lesion.  Tongue normal. Mucous membranes moist.  RESPIRATORY:  Clear to auscultation without rales, wheezes or rhonchi. CARDIOVASCULAR:  Regular rate and rhythm without murmur, rub or gallop. ABDOMEN:  Soft, non-tender, with active bowel sounds, and no hepatosplenomegaly.  No masses. SKIN:  No rashes, ulcers or lesions. EXTREMITIES: No edema, no skin discoloration or tenderness.  No palpable cords. LYMPH NODES: No palpable cervical, supraclavicular, axillary or inguinal adenopathy  NEUROLOGICAL: Unremarkable. PSYCH:  Appropriate.    Appointment on 08/25/2018  Component Date Value Ref Range  Status  . WBC 08/25/2018 7.0  4.0 - 10.5 K/uL Final  . RBC 08/25/2018 5.25* 3.87 - 5.11 MIL/uL Final  . Hemoglobin 08/25/2018 16.3* 12.0 - 15.0 g/dL Final  . HCT 08/25/2018 49.9* 36.0 - 46.0 % Final  . MCV 08/25/2018 95.0  80.0 - 100.0 fL Final  . MCH 08/25/2018 31.0  26.0 - 34.0 pg Final  . MCHC 08/25/2018 32.7  30.0 - 36.0 g/dL Final  . RDW 08/25/2018 12.9  11.5 - 15.5 % Final  . Platelets 08/25/2018 228  150 - 400 K/uL Final  . nRBC 08/25/2018 0.0  0.0 - 0.2 % Final  . Neutrophils Relative % 08/25/2018 52  % Final  . Neutro Abs 08/25/2018 3.7  1.7 - 7.7 K/uL Final  . Lymphocytes Relative 08/25/2018 34  % Final  . Lymphs Abs 08/25/2018 2.4  0.7 - 4.0 K/uL Final  . Monocytes Relative 08/25/2018 11  % Final  . Monocytes Absolute 08/25/2018 0.8  0.1 - 1.0 K/uL Final  . Eosinophils Relative 08/25/2018 1  % Final  . Eosinophils Absolute  08/25/2018 0.1  0.0 - 0.5 K/uL Final  . Basophils Relative 08/25/2018 1  % Final  . Basophils Absolute 08/25/2018 0.1  0.0 - 0.1 K/uL Final  . Immature Granulocytes 08/25/2018 1  % Final  . Abs Immature Granulocytes 08/25/2018 0.04  0.00 - 0.07 K/uL Final   Performed at [facility], [address] [place], [place] [address]    Assessment:  Sabrina Zamora is a 85 y.o. female with secondary polycythemia first noted in 2013.  She has known sleep apnea (wears oxygen at night but no CPAP mask) and asthma.  She has never smoked.  She is unaware of any cardiac issues.  Work-up on 01/11/2012 revealed a negative JAK2 V617F and exon 12 mutation.  She had a mildy elevated erythropoietin level (46.9) with repeat normal (4.4) on 05/06/2015.  Initial carboxyhemoglobin was 4.6% (high) then 2.3% (normal) on 05/06/2015.   WBC and platelet count have been normal.  Abdominal ultrasound was normal.  She has undergone phlebotomy x 5 to keep hematocrit < 50.  She last underwent phlebotomy of 200 cc on 05/24/2018.  Ferritin was 34 and iron saturation of 32% on  05/06/2015.  She has a history of melanoma x 3.  She had a T1a melanoma in 02/2011.  She has a history of high potassium and calcium.  She is off oral calcium.  She avoids foods high in potassium.  Symptomatically, she is doing well.  She denies any complaints.  Exam is stable.  Hematocrit 49.9 and hemoglobin 16.3.  Plan: 1.   Labs today:  CBC with diff, ferritin, carbon monoxide level, epo level, JAK2 with reflex, BCR-ABL. 2.   Secondary polycythemia  Discuss interval events.  Last phlebotomy on 05/24/2018.  Review hematocrit goal < 50.  Review hematocrit just at cut-off.  Plan for small volume phlebotomy today. .  3.   Discuss transition to [place].  Patient wishes to stay in [place].   4.   RTC in 2 months for MD assessment (Dr Tasia Catchings), labs (CBC), and +/- phlebotomy.   Honor Loh, NP  08/25/2018, 2:48 PM  I saw and evaluated the patient, participating in the key portions of the service and reviewing pertinent diagnostic studies and records.  I reviewed the nurse practitioner's note and agree with the findings and the plan.  The assessment and plan were discussed with the patient.  Several questions were asked by the patient and answered.   Nolon Stalls, MD 08/25/2018,2:48 PM

## 2018-08-25 NOTE — Progress Notes (Signed)
200 cc of blood removed via therapeutic phlebotomy per order. Pt in NAD. VSS.

## 2018-08-25 NOTE — Progress Notes (Signed)
Pt here for follow up. Denies any concerns at this time. States she would like to discuss her diet plans.

## 2018-08-27 LAB — ERYTHROPOIETIN: ERYTHROPOIETIN: 9.5 m[IU]/mL (ref 2.6–18.5)

## 2018-08-29 LAB — CARBON MONOXIDE, BLOOD (PERFORMED AT REF LAB): Carbon Monoxide, Blood: 2.7 % (ref 0.0–3.6)

## 2018-08-30 DIAGNOSIS — M810 Age-related osteoporosis without current pathological fracture: Secondary | ICD-10-CM | POA: Diagnosis not present

## 2018-09-05 LAB — BCR-ABL1 FISH
Cells Analyzed: 200
Cells Counted: 200

## 2018-09-06 LAB — CALR + JAK2 E12-15 + MPL (REFLEXED)

## 2018-09-06 LAB — JAK2 V617F, W REFLEX TO CALR/E12/MPL

## 2018-09-11 ENCOUNTER — Ambulatory Visit
Admission: RE | Admit: 2018-09-11 | Discharge: 2018-09-11 | Disposition: A | Discharge status: Home / Self Care | Payer: Medicare Other | Source: Ambulatory Visit | Attending: Nurse Practitioner | Admitting: Nurse Practitioner

## 2018-09-11 DIAGNOSIS — R922 Inconclusive mammogram: Secondary | ICD-10-CM

## 2018-09-11 DIAGNOSIS — R928 Other abnormal and inconclusive findings on diagnostic imaging of breast: Secondary | ICD-10-CM | POA: Insufficient documentation

## 2018-09-18 DIAGNOSIS — M810 Age-related osteoporosis without current pathological fracture: Secondary | ICD-10-CM | POA: Diagnosis not present

## 2018-10-02 ENCOUNTER — Other Ambulatory Visit: Payer: Self-pay

## 2018-10-02 MED ORDER — AMLODIPINE BESYLATE 2.5 MG PO TABS: ORAL_TABLET | ORAL | 5 refills | Status: DC

## 2018-10-02 MED ORDER — ADVAIR DISKUS 250-50 MCG/DOSE IN AEPB: INHALATION_SPRAY | RESPIRATORY_TRACT | 3 refills | Status: DC

## 2018-10-03 ENCOUNTER — Ambulatory Visit: Payer: Self-pay | Admitting: Nurse Practitioner

## 2018-10-04 ENCOUNTER — Other Ambulatory Visit: Payer: Self-pay

## 2018-10-04 MED ORDER — MUPIROCIN CALCIUM 2 % NA OINT: 1.0000 "application " | TOPICAL_OINTMENT | Freq: Every day | NASAL | 1 refills | Status: DC

## 2018-10-18 ENCOUNTER — Other Ambulatory Visit: Payer: Self-pay

## 2018-10-18 MED ORDER — LEVOTHYROXINE SODIUM 25 MCG PO TABS: ORAL_TABLET | ORAL | 0 refills | Status: DC

## 2018-10-24 ENCOUNTER — Ambulatory Visit: Payer: Medicare Other | Admitting: Oncology

## 2018-10-24 ENCOUNTER — Other Ambulatory Visit: Payer: Medicare Other

## 2018-11-06 ENCOUNTER — Ambulatory Visit: Payer: Self-pay | Admitting: Nurse Practitioner

## 2018-11-14 ENCOUNTER — Ambulatory Visit: Payer: Self-pay | Admitting: Nurse Practitioner

## 2018-11-28 ENCOUNTER — Other Ambulatory Visit: Payer: Self-pay

## 2018-11-29 ENCOUNTER — Inpatient Hospital Stay: Payer: Medicare Other | Attending: Oncology

## 2018-11-29 ENCOUNTER — Encounter (INDEPENDENT_AMBULATORY_CARE_PROVIDER_SITE_OTHER): Payer: Self-pay

## 2018-11-29 ENCOUNTER — Inpatient Hospital Stay: Payer: Medicare Other

## 2018-11-29 ENCOUNTER — Other Ambulatory Visit: Payer: Self-pay

## 2018-11-29 ENCOUNTER — Encounter: Payer: Self-pay | Admitting: Oncology

## 2018-11-29 ENCOUNTER — Inpatient Hospital Stay (HOSPITAL_BASED_OUTPATIENT_CLINIC_OR_DEPARTMENT_OTHER): Payer: Medicare Other | Admitting: Oncology

## 2018-11-29 VITALS — BP 138/87 | HR 90

## 2018-11-29 VITALS — BP 163/96 | HR 86 | Temp 99.0°F | Wt 97.6 lb

## 2018-11-29 DIAGNOSIS — D751 Secondary polycythemia: Secondary | ICD-10-CM

## 2018-11-29 DIAGNOSIS — Z79899 Other long term (current) drug therapy: Secondary | ICD-10-CM

## 2018-11-29 LAB — CBC
HCT: 51.3 % — ABNORMAL HIGH (ref 36.0–46.0)
Hemoglobin: 16.8 g/dL — ABNORMAL HIGH (ref 12.0–15.0)
MCH: 31.1 pg (ref 26.0–34.0)
MCHC: 32.7 g/dL (ref 30.0–36.0)
MCV: 95 fL (ref 80.0–100.0)
Platelets: 230 10*3/uL (ref 150–400)
RBC: 5.4 MIL/uL — ABNORMAL HIGH (ref 3.87–5.11)
RDW: 13.3 % (ref 11.5–15.5)
WBC: 7.4 10*3/uL (ref 4.0–10.5)
nRBC: 0 % (ref 0.0–0.2)

## 2018-11-29 NOTE — Progress Notes (Signed)
Therapeutic phlebotomy performed per MD order removing 200cc using 20 gauge PIV in right AC. Pt tolerated procedure well drink provided snack offered and pt declined. Pt and VS stable at discharge.

## 2018-12-02 NOTE — Progress Notes (Signed)
[facility] day:  12/02/18  Chief Complaint: Sabrina Zamora is a 85 y.o. female with a history of secondary polycythemia presents for follow up .   HPI:  The patient was last seen in the medical oncology clinic on 02/04/2017.  At that time, she felt good.  Exam was unremarkable.  Hematocrit was 46.1 and hemoglobin 15.6.  She had mot required a phlebotomy since 04/2015.  Follow-up appointments were spread out.  CBC was to be checked every 6 months.   Patient previously followed up with Dr. Mike Gip.  Patient switched care to me on 11/29/2018. Extensive chart review was performed.  #Secondary polycythemia Patient has a history of sleep apnea she wears oxygen at night but no CPAP mask.  Never smoker. Work-up on 01/11/2012 reviewed and negative Jak 2 [id number] mutation, exon 12 mutation. 08/25/2018 CALR negative, JAK 2 E12-15 negative, MPL negative.  Patient has been on phlebotomy program to keep hematocrit less than 48 and then later to keep it less than 50.  #History of melanoma x3.  T1 a melanoma in August 2012.    INTERVAL HISTORY Sabrina Zamora is a 85 y.o. female who has above history reviewed by me today presents for follow up visit for management of secondary erythrocytosis. Problems and complaints are listed below: Patient reports feeling well.  Denies any shortness of breath, chest pain, abdominal pain, nausea vomiting.   Review of Systems  Constitutional: Negative for appetite change, chills, fatigue and fever.  HENT:   Negative for hearing loss and voice change.   Eyes: Negative for eye problems.  Respiratory: Negative for chest tightness and cough.   Cardiovascular: Negative for chest pain.  Gastrointestinal: Negative for abdominal distention, abdominal pain and blood in stool.  Endocrine: Negative for hot flashes.  Genitourinary: Negative for difficulty urinating and frequency.   Musculoskeletal: Negative for arthralgias.  Skin:  Negative for itching and rash.  Neurological: Negative for extremity weakness.  Hematological: Negative for adenopathy.  Psychiatric/Behavioral: Negative for confusion.    Past Medical History:  Diagnosis Date  . Asthma   . Hypothyroidism   . Osteoporosis   . Skin cancer 2012, 2016    Past Surgical History:  Procedure Laterality Date  . BREAST BIOPSY Left 2003   neg  . BREAST BIOPSY Right 09/27/2017   12:00 1 cmfn fibroadenomatous change   . BREAST BIOPSY Right 09/17/2017   12:00 3 cmfn benign breast and fibroadipose tissue  . BREAST EXCISIONAL BIOPSY Left 1988   excisional - neg  . CATARACT EXTRACTION W/PHACO Left 09/20/2017   Procedure: CATARACT EXTRACTION PHACO AND INTRAOCULAR LENS PLACEMENT ([place]) LEFT;  Surgeon: Leandrew Koyanagi, MD;  Location: [place];  Service: Ophthalmology;  Laterality: Left;  . CATARACT EXTRACTION W/PHACO Right 02/15/2018   Procedure: CATARACT EXTRACTION PHACO AND INTRAOCULAR LENS PLACEMENT ([place]) TOPICAL  RIGHT;  Surgeon: Leandrew Koyanagi, MD;  Location: [place];  Service: Ophthalmology;  Laterality: Right;  prefers early  . CHOLECYSTECTOMY      Family History  Problem Relation Age of Onset  . Clotting disorder Sister   . Breast cancer Sister 75    Social History:  reports that she has never smoked. She has never used smokeless tobacco. She reports that she does not drink alcohol or use drugs.  She has never smoked.  The patient is alone today.  Allergies:  Allergies  Allergen Reactions  . Levofloxacin Rash and Shortness Of Breath  . Penicillins Hives and  Rash  . Betaine Rash  . Ciprofloxacin Itching and Rash  . Sulfa Antibiotics Rash    Current Medications: Current Outpatient Medications  Medication Sig Dispense Refill  . ADVAIR DISKUS 250-50 MCG/DOSE AEPB TAKE 1 PUFF TWICE A DAY 1 each 3  . amLODipine (NORVASC) 2.5 MG tablet Take 1 to 2 tab po daily for blood pressure 45 tablet 5  . aspirin EC 81 MG tablet  Take 81 mg by mouth.    . Cholecalciferol (VITAMIN D PO) Take by mouth daily.     Marland Kitchen conjugated estrogens (PREMARIN) vaginal cream Place 1 Applicatorful vaginally daily. Use pea sized amount M-W-Fr before bedtime 42.5 g 3  . levothyroxine (SYNTHROID, LEVOTHROID) 25 MCG tablet Take 1.5 tab po Daily before breakfast 135 tablet 0  . lovastatin (MEVACOR) 20 MG tablet Take 1 tablet (20 mg total) by mouth at bedtime. 90 tablet 2  . Multiple Vitamin (MULTI-VITAMINS) TABS Take by mouth.    . mupirocin nasal ointment (BACTROBAN) 2 % Place 1 application into the nose at bedtime. 10 g 1  . omeprazole (PRILOSEC) 20 MG capsule Take 20 mg by mouth every other day.     . Probiotic Product (PROBIOTIC DAILY PO) Take by mouth daily.     No current facility-administered medications for this visit.     Physical Exam: Blood pressure (!) 163/96, pulse 86, temperature 99 F (37.2 C), temperature source Tympanic, weight 97 lb 9 oz (44.3 kg).  Physical Exam  Constitutional: She is oriented to person, place, and time. No distress.  thin  HENT:  Head: Normocephalic and atraumatic.  Nose: Nose normal.  Mouth/Throat: Oropharynx is clear and moist. No oropharyngeal exudate.  Eyes: Pupils are equal, round, and reactive to light. EOM are normal. No scleral icterus.  Neck: Normal range of motion. Neck supple.  Cardiovascular: Normal rate and regular rhythm.  No murmur heard. Pulmonary/Chest: Effort normal. No respiratory distress. She has no rales. She exhibits no tenderness.  Abdominal: Soft. She exhibits no distension. There is no abdominal tenderness.  Musculoskeletal: Normal range of motion.        General: No edema.  Neurological: She is alert and oriented to person, place, and time. No cranial nerve deficit. She exhibits normal muscle tone. Coordination normal.  Skin: Skin is warm and dry. She is not diaphoretic. No erythema.  Psychiatric: Affect normal.     Appointment on 11/29/2018  Component Date Value  Ref Range Status  . WBC 11/29/2018 7.4  4.0 - 10.5 K/uL Final  . RBC 11/29/2018 5.40* 3.87 - 5.11 MIL/uL Final  . Hemoglobin 11/29/2018 16.8* 12.0 - 15.0 g/dL Final  . HCT 11/29/2018 51.3* 36.0 - 46.0 % Final  . MCV 11/29/2018 95.0  80.0 - 100.0 fL Final  . MCH 11/29/2018 31.1  26.0 - 34.0 pg Final  . MCHC 11/29/2018 32.7  30.0 - 36.0 g/dL Final  . RDW 11/29/2018 13.3  11.5 - 15.5 % Final  . Platelets 11/29/2018 230  150 - 400 K/uL Final  . nRBC 11/29/2018 0.0  0.0 - 0.2 % Final   Performed at [facility], [address], [place], [place] [address]    Assessment:  Sabrina Zamora is a 85 y.o. female present for follow up of erythrocytosis.  1. Erythrocytosis    Labs are reviewed and discussed with patient. Discussed with patient that erythrocytosis is secondary to OSA, and this is a compensation process. I recommend phlebotomy 200 cc if hematocrit above 50. Today CBC showed  hemoglobin 16.8, hematocrit 51.3 Recommend proceed 200 cc of phlebotomy. Continue follow-up with primary care provider Dr. Humphrey Rolls.   We spent sufficient time to discuss many aspect of care, questions were answered to patient's satisfaction. Total face to face encounter time for this patient visit was 15 min. >50% of the time was  spent in counseling and coordination of care.    Earlie Server, MD  12/02/2018, 8:01 PM

## 2018-12-28 ENCOUNTER — Other Ambulatory Visit: Payer: Self-pay

## 2018-12-28 ENCOUNTER — Encounter: Payer: Self-pay | Admitting: Nurse Practitioner

## 2018-12-28 ENCOUNTER — Ambulatory Visit (INDEPENDENT_AMBULATORY_CARE_PROVIDER_SITE_OTHER): Payer: Medicare Other | Admitting: Nurse Practitioner

## 2018-12-28 VITALS — BP 128/82 | HR 86 | Resp 16 | Ht 62.5 in | Wt 100.6 lb

## 2018-12-28 DIAGNOSIS — D751 Secondary polycythemia: Secondary | ICD-10-CM

## 2018-12-28 DIAGNOSIS — Z9981 Dependence on supplemental oxygen: Secondary | ICD-10-CM

## 2018-12-28 DIAGNOSIS — I10 Essential (primary) hypertension: Secondary | ICD-10-CM | POA: Diagnosis not present

## 2018-12-28 DIAGNOSIS — J449 Chronic obstructive pulmonary disease, unspecified: Secondary | ICD-10-CM | POA: Insufficient documentation

## 2018-12-28 DIAGNOSIS — K819 Cholecystitis, unspecified: Secondary | ICD-10-CM | POA: Insufficient documentation

## 2018-12-28 DIAGNOSIS — J4489 Other specified chronic obstructive pulmonary disease: Secondary | ICD-10-CM | POA: Insufficient documentation

## 2018-12-28 DIAGNOSIS — K859 Acute pancreatitis without necrosis or infection, unspecified: Secondary | ICD-10-CM | POA: Insufficient documentation

## 2018-12-28 MED ORDER — FLUTICASONE-SALMETEROL 250-50 MCG/DOSE IN AEPB: 1.0000 | INHALATION_SPRAY | Freq: Two times a day (BID) | RESPIRATORY_TRACT | 3 refills | Status: DC

## 2018-12-28 NOTE — Progress Notes (Signed)
[facility] [place], [place] [address]  Internal MEDICINE  Office Visit Note  Patient Name: Sabrina Zamora  [id number]  [id number]  Date of Service: 12/28/2018  Chief Complaint  Patient presents with  . Medical Management of Chronic Issues    6 month follow up,   . Asthma  . Osteoporosis    The patient is here for routine follow up. She is concerned about her blood pressure.  She sees hematology doe to erythrocytosis. New hematologist has suggested she use a CPAP. The patient was on CPAP at one time. This produced a great deal of sputum production. Stopped using it and was placed on nocturnal oxygen which she still uses. Hematologist drew diagram showing the benefit of CPAP to her and erythrocytosis. Will get home sleep test for further evaluation of need. Blood pressure is up and down. She takes her blood pressure medication as needed and this controls pressure very well.       Current Medication: Outpatient Encounter Medications as of 12/28/2018  Medication Sig Note  . amLODipine (NORVASC) 2.5 MG tablet Take 1 to 2 tab po daily for blood pressure   . aspirin EC 81 MG tablet Take 81 mg by mouth. 02/04/2017: Takes every other day.  . Cholecalciferol (VITAMIN D PO) Take by mouth daily.  08/25/2018: Unsure of dose   . conjugated estrogens (PREMARIN) vaginal cream Place 1 Applicatorful vaginally daily. Use pea sized amount M-W-Fr before bedtime   . Fluticasone-Salmeterol (ADVAIR) 250-50 MCG/DOSE AEPB Inhale 1 puff into the lungs 2 (two) times daily.   Marland Kitchen levothyroxine (SYNTHROID, LEVOTHROID) 25 MCG tablet Take 1.5 tab po Daily before breakfast   . lovastatin (MEVACOR) 20 MG tablet Take 1 tablet (20 mg total) by mouth at bedtime.   . Multiple Vitamin (MULTI-VITAMINS) TABS Take by mouth. 05/06/2015: Received from: [facility]  . mupirocin nasal ointment (BACTROBAN) 2 % Place 1 application into the nose at bedtime.   Marland Kitchen omeprazole (PRILOSEC) 20 MG capsule Take  20 mg by mouth every other day.  05/06/2015: Received from: [facility]  . Probiotic Product (PROBIOTIC DAILY PO) Take by mouth daily.   . [DISCONTINUED] Fluticasone-Salmeterol (ADVAIR) 250-50 MCG/DOSE AEPB Inhale 1 puff into the lungs 2 (two) times daily.   . [DISCONTINUED] ADVAIR DISKUS 250-50 MCG/DOSE AEPB TAKE 1 PUFF TWICE A DAY (Patient not taking: Reported on 12/28/2018)    No facility-administered encounter medications on file as of 12/28/2018.     Surgical History: Past Surgical History:  Procedure Laterality Date  . BREAST BIOPSY Left 2003   neg  . BREAST BIOPSY Right 09/27/2017   12:00 1 cmfn fibroadenomatous change   . BREAST BIOPSY Right 09/17/2017   12:00 3 cmfn benign breast and fibroadipose tissue  . BREAST EXCISIONAL BIOPSY Left 1988   excisional - neg  . CATARACT EXTRACTION W/PHACO Left 09/20/2017   Procedure: CATARACT EXTRACTION PHACO AND INTRAOCULAR LENS PLACEMENT ([place]) LEFT;  Surgeon: Leandrew Koyanagi, MD;  Location: [place];  Service: Ophthalmology;  Laterality: Left;  . CATARACT EXTRACTION W/PHACO Right 02/15/2018   Procedure: CATARACT EXTRACTION PHACO AND INTRAOCULAR LENS PLACEMENT ([place]) TOPICAL  RIGHT;  Surgeon: Leandrew Koyanagi, MD;  Location: [place];  Service: Ophthalmology;  Laterality: Right;  prefers early  . CHOLECYSTECTOMY      Medical History: Past Medical History:  Diagnosis Date  . Asthma   . Hypothyroidism   . Osteoporosis   . Skin cancer 2012, 2016    Family  History: Family History  Problem Relation Age of Onset  . Clotting disorder Sister   . Breast cancer Sister 1    Social History   Socioeconomic History  . Marital status: Married    Spouse name: Not on file  . Number of children: Not on file  . Years of education: Not on file  . Highest education level: Not on file  Occupational History  . Not on file  Social Needs  . Financial resource strain: Not on file  . Food insecurity    Worry: Not  on file    Inability: Not on file  . Transportation needs    Medical: Not on file    Non-medical: Not on file  Tobacco Use  . Smoking status: Never Smoker  . Smokeless tobacco: Never Used  Substance and Sexual Activity  . Alcohol use: Never    Alcohol/week: 0.0 standard drinks    Frequency: Never  . Drug use: Never  . Sexual activity: Not on file  Lifestyle  . Physical activity    Days per week: Not on file    Minutes per session: Not on file  . Stress: Not on file  Relationships  . Social Herbalist on phone: Not on file    Gets together: Not on file    Attends religious service: Not on file    Active member of club or organization: Not on file    Attends meetings of clubs or organizations: Not on file    Relationship status: Not on file  . Intimate partner violence    Fear of current or ex partner: Not on file    Emotionally abused: Not on file    Physically abused: Not on file    Forced sexual activity: Not on file  Other Topics Concern  . Not on file  Social History Narrative  . Not on file      Review of Systems  Constitutional: Negative for chills, diaphoresis, fatigue and unexpected weight change.  HENT: Negative for ear pain, postnasal drip, sinus pressure and sore throat.   Respiratory: Negative for cough, shortness of breath and wheezing.   Cardiovascular: Negative for chest pain, palpitations and leg swelling.       Elevated blood pressure.  Gastrointestinal: Negative for abdominal pain, constipation, diarrhea, nausea and vomiting.  Endocrine: Negative for cold intolerance, heat intolerance, polydipsia and polyuria.  Musculoskeletal: Negative for arthralgias, back pain, gait problem and neck pain.  Skin: Negative for color change and rash.  Allergic/Immunologic: Negative for environmental allergies and food allergies.  Neurological: Negative for dizziness, syncope and headaches.  Hematological: Does not bruise/bleed easily.   Psychiatric/Behavioral: Negative for agitation, behavioral problems (depression) and hallucinations. The patient is not nervous/anxious.    Today's Vitals   12/28/18 1352  BP: 128/82  Pulse: 86  Resp: 16  SpO2: 97%  Weight: 100 lb 9.6 oz (45.6 kg)  Height: 5' 2.5" (1.588 m)   Body mass index is 18.11 kg/m.   Physical Exam Vitals signs and nursing note reviewed.  Constitutional:      General: She is not in acute distress.    Appearance: Normal appearance. She is well-developed. She is not diaphoretic.  HENT:     Head: Normocephalic and atraumatic.     Nose: Nose normal.     Mouth/Throat:     Pharynx: No oropharyngeal exudate.  Eyes:     Conjunctiva/sclera: Conjunctivae normal.     Pupils: Pupils are equal, round, and reactive  to light.  Neck:     Musculoskeletal: Normal range of motion and neck supple.     Thyroid: No thyromegaly.     Vascular: No carotid bruit or JVD.     Trachea: No tracheal deviation.  Cardiovascular:     Rate and Rhythm: Normal rate and regular rhythm.     Heart sounds: Normal heart sounds. No murmur. No friction rub. No gallop.   Pulmonary:     Effort: Pulmonary effort is normal. No respiratory distress.     Breath sounds: Normal breath sounds. No rales.  Chest:     Chest wall: No tenderness.  Abdominal:     General: Bowel sounds are normal.     Palpations: Abdomen is soft.     Tenderness: There is no abdominal tenderness.  Musculoskeletal: Normal range of motion.  Lymphadenopathy:     Cervical: No cervical adenopathy.  Skin:    General: Skin is warm and dry.     Capillary Refill: Capillary refill takes 2 to 3 seconds.  Neurological:     Mental Status: She is alert and oriented to person, place, and time.     Cranial Nerves: No cranial nerve deficit.  Psychiatric:        Behavior: Behavior normal.        Thought Content: Thought content normal.        Judgment: Judgment normal.   Assessment/Plan: 1. Benign hypertension Stable.  Continue bp medication as prescribed   2. Erythrocytosis Will get home sleep study/overnight pulse oximetry study for further evaluation.  - Home sleep test  3. Oxygen dependent  Will get home sleep study/overnight pulse oximetry study for further evaluation.  - Home sleep test  4. Obstructive chronic bronchitis without exacerbation ([place]) Continue with inhalers as prescribed  - Fluticasone-Salmeterol (ADVAIR) 250-50 MCG/DOSE AEPB; Inhale 1 puff into the lungs 2 (two) times daily.  Dispense: 60 each; Refill: 3  General Counseling: Joellyn verbalizes understanding of the findings of todays visit and agrees with plan of treatment. I have discussed any further diagnostic evaluation that may be needed or ordered today. We also reviewed her medications today. she has been encouraged to call the office with any questions or concerns that should arise related to todays visit.   Cardiac risk factor modification:  1. Control blood pressure. 2. Exercise as prescribed. 3. Follow low sodium, low fat diet. and low fat and low cholestrol diet. 4. Take ASA 81mg  once a day. 5. Restricted calories diet to lose weight.  This patient was seen by Leretha Pol FNP Collaboration with Dr Lavera Guise as a part of collaborative care agreement  Orders Placed This Encounter  Procedures  . Home sleep test    Meds ordered this encounter  Medications  . Fluticasone-Salmeterol (ADVAIR) 250-50 MCG/DOSE AEPB    Sig: Inhale 1 puff into the lungs 2 (two) times daily.    Dispense:  60 each    Refill:  3    Order Specific Question:   Supervising Provider    Answer:   Lavera Guise [[id number]]    Time spent: 56 Minutes      Dr Lavera Guise Internal medicine

## 2018-12-28 NOTE — Progress Notes (Signed)
Pt blood pressure elevated, informed provider. 

## 2019-01-01 ENCOUNTER — Other Ambulatory Visit: Payer: Medicare Other | Admitting: Internal Medicine

## 2019-01-08 ENCOUNTER — Telehealth: Payer: Self-pay | Admitting: Urology

## 2019-01-08 NOTE — Telephone Encounter (Signed)
Pt saw Erlene Quan on 6/19.  She is having burning with urination and thinks she has an infection.  Please advise.

## 2019-01-08 NOTE — Telephone Encounter (Signed)
She will need an appointment with a provider. She has not been seen in over a year.

## 2019-01-09 DIAGNOSIS — Z961 Presence of intraocular lens: Secondary | ICD-10-CM | POA: Diagnosis not present

## 2019-01-09 DIAGNOSIS — H01025 Squamous blepharitis left lower eyelid: Secondary | ICD-10-CM | POA: Diagnosis not present

## 2019-01-09 DIAGNOSIS — H01022 Squamous blepharitis right lower eyelid: Secondary | ICD-10-CM | POA: Diagnosis not present

## 2019-01-09 DIAGNOSIS — H40013 Open angle with borderline findings, low risk, bilateral: Secondary | ICD-10-CM | POA: Diagnosis not present

## 2019-01-09 NOTE — Progress Notes (Signed)
01/10/2019 11:06 AM   Sabrina Zamora [birth year] [id number]  Referring provider: Lavera Guise, [place],  [place] [address]  Chief Complaint  Patient presents with  . Dysuria    HPI: 85 year old female with a history of asymptomatic bacteriuria who presents today for burning with urination.  Background history She is been struggling with recurrent/persistent urinary tract infections since April 2019.  On 10/31/2017, she grew Citrobacter and Proteus which is fairly sensitive but she does have multiple drug allergies.  She then grew Proteus again on 11/15/2017.  Most recently, she grew the same Proteus and Citrobacter on 12/06/2017.  Renal ultrasound was performed on 12/16/2017.  For unclear reasons, I am unable to see report or images.  She notes that her initial UTI on this occasion, she was going in for a blood pressure check and she also had her urine checked routinely.  At the time, she was relatively asymptomatic.  Her urine was checked and rechecked for infection or evidence of clearance but she had no significant urinary symptoms throughout.  She notes that over the past few years, she is had approximately 2-3 urinary tract infections every year but these are only rarely symptomatic.  No history of pyelonephritis or febrile UTIs.  No flank pain or gross hematuria.  She is not currently sexually active.  She was on Premarin cream in the remote past.  She does take cranberry tabs once daily.  She also takes a probiotic.  At baseline, she denies any urinary frequency, urgency, or significant incontinence.  No dysuria.  No significant nocturia.  She is very pleased with her voiding symptoms.  She is extremely healthy for her age and advises that she "keeps moving" at this degree to her good health.  Today, she is complaining of dysuria that started one week ago.  She is also having some mild low back pain.  The dysuria and back pain are mild.  Patient denies any gross hematuria or  suprapubic/flank pain.  Patient denies any fevers, chills, nausea or vomiting. Her PVR is 0 mL.  Her UA is positive for > 30 WBC's and 3-10 RBC's.    She has been engaging in UTI prevention strategies with vaginal estrogen cream, taking cranberry tablets, drinking 6 bottles of water daily and avoiding tub baths.  She has not had an UTI since she was last seen with [place] in June 2019.    PMH: Past Medical History:  Diagnosis Date  . Asthma   . Hypothyroidism   . Osteoporosis   . Skin cancer 2012, 2016    Surgical History: Past Surgical History:  Procedure Laterality Date  . BREAST BIOPSY Left 2003   neg  . BREAST BIOPSY Right 09/27/2017   12:00 1 cmfn fibroadenomatous change   . BREAST BIOPSY Right 09/17/2017   12:00 3 cmfn benign breast and fibroadipose tissue  . BREAST EXCISIONAL BIOPSY Left 1988   excisional - neg  . CATARACT EXTRACTION W/PHACO Left 09/20/2017   Procedure: CATARACT EXTRACTION PHACO AND INTRAOCULAR LENS PLACEMENT ([place]) LEFT;  Surgeon: Leandrew Koyanagi, MD;  Location: [facility];  Service: Ophthalmology;  Laterality: Left;  . CATARACT EXTRACTION W/PHACO Right 02/15/2018   Procedure: CATARACT EXTRACTION PHACO AND INTRAOCULAR LENS PLACEMENT ([place]) TOPICAL  RIGHT;  Surgeon: Leandrew Koyanagi, MD;  Location: [facility];  Service: Ophthalmology;  Laterality: Right;  prefers early  . CHOLECYSTECTOMY      Home Medications:  Allergies as of 01/10/2019  Reactions   Levofloxacin Rash, Shortness Of Breath   Penicillins Hives, Rash   Betaine Rash   Ciprofloxacin Itching, Rash   Sulfa Antibiotics Rash      Medication List       Accurate as of January 10, 2019 11:06 AM. If you have any questions, ask your nurse or doctor.        amLODipine 2.5 MG tablet Commonly known as: NORVASC Take 1 to 2 tab po daily for blood pressure   aspirin EC 81 MG tablet Take 81 mg by mouth.   conjugated estrogens vaginal cream Commonly known as: Premarin Place 1  Applicatorful vaginally daily. Use pea sized amount M-W-Fr before bedtime   Fluticasone-Salmeterol 250-50 MCG/DOSE Aepb Commonly known as: ADVAIR Inhale 1 puff into the lungs 2 (two) times daily.   levothyroxine 25 MCG tablet Commonly known as: SYNTHROID Take 1.5 tab po Daily before breakfast   lovastatin 20 MG tablet Commonly known as: MEVACOR Take 1 tablet (20 mg total) by mouth at bedtime.   Multi-Vitamins Tabs Take by mouth.   mupirocin nasal ointment 2 % Commonly known as: BACTROBAN Place 1 application into the nose at bedtime.   omeprazole 20 MG capsule Commonly known as: PRILOSEC Take 20 mg by mouth every other day.   PROBIOTIC DAILY PO Take by mouth daily.   VITAMIN D PO Take by mouth daily.       Allergies:  Allergies  Allergen Reactions  . Levofloxacin Rash and Shortness Of Breath  . Penicillins Hives and Rash  . Betaine Rash  . Ciprofloxacin Itching and Rash  . Sulfa Antibiotics Rash    Family History: Family History  Problem Relation Age of Onset  . Clotting disorder Sister   . Breast cancer Sister 65    Social History:  reports that she has never smoked. She has never used smokeless tobacco. She reports that she does not drink alcohol or use drugs.  ROS: UROLOGY Frequent Urination?: No Hard to postpone urination?: No Burning/pain with urination?: Yes Get up at night to urinate?: No Leakage of urine?: No Urine stream starts and stops?: No Trouble starting stream?: No Do you have to strain to urinate?: No Blood in urine?: No Urinary tract infection?: No Sexually transmitted disease?: No Injury to kidneys or bladder?: No Painful intercourse?: No Weak stream?: No Currently pregnant?: No Vaginal bleeding?: No Last menstrual period?: n  Gastrointestinal Nausea?: No Vomiting?: No Indigestion/heartburn?: No Diarrhea?: No Constipation?: No  Constitutional Fever: No Night sweats?: No Weight loss?: No Fatigue?: No  Skin Skin  rash/lesions?: No Itching?: No  Eyes Blurred vision?: No Double vision?: No  Ears/Nose/Throat Sore throat?: No Sinus problems?: No  Hematologic/Lymphatic Swollen glands?: No Easy bruising?: No  Cardiovascular Leg swelling?: No Chest pain?: No  Respiratory Cough?: No Shortness of breath?: No  Endocrine Excessive thirst?: No  Musculoskeletal Back pain?: No Joint pain?: No  Neurological Headaches?: No Dizziness?: No  Psychologic Depression?: No Anxiety?: No  Physical Exam: BP (!) 159/99 (BP Location: Left Arm, Patient Position: Sitting, Cuff Size: Normal)   Pulse 92   Ht 5' 2.5" (1.588 m)   Wt 92 lb (41.7 kg)   BMI 16.56 kg/m   Constitutional:  Well nourished. Alert and oriented, No acute distress. HEENT: [place] AT, moist mucus membranes.  Trachea midline, no masses. Cardiovascular: No clubbing, cyanosis, or edema. Respiratory: Normal respiratory effort, no increased work of breathing. Neurologic: Grossly intact, no focal deficits, moving all 4 extremities. Psychiatric: Normal mood and affect.  Laboratory Data: Lab Results  Component Value Date   WBC 7.4 11/29/2018   HGB 16.8 (H) 11/29/2018   HCT 51.3 (H) 11/29/2018   MCV 95.0 11/29/2018   PLT 230 11/29/2018    Lab Results  Component Value Date   CREATININE 0.83 05/10/2018    Urinalysis > 30 WBC's and 3-10 RBC's.  See Epic. I have reviewed the labs.  Pertinent Imaging: Results for Sabrina Zamora, Sabrina Zamora (MRN [id number]) as of 01/10/2019 11:30  Ref. Range 01/10/2019 10:43  Scan Result Unknown 0   Assessment & Plan:    1. Dysuria UA is suspicious for infection - will send for culture - wait on sensitivities to prescribe an antibiotic Patient will contact the office if her symptoms worsen prior to culture results being available   2. Microscopic hematuria Likely due to infection, but we need to confirm resolution once UTI is treated  3. History of asymptomatic bacteriuria Continue vaginal estrogen  cream, cranberry tablets and water intake  Return for pending urine culture resutls .  Zara Council, PA-C  [facility] [address], [place], [place] [address] [phone]

## 2019-01-10 ENCOUNTER — Other Ambulatory Visit: Payer: Self-pay

## 2019-01-10 ENCOUNTER — Ambulatory Visit (INDEPENDENT_AMBULATORY_CARE_PROVIDER_SITE_OTHER): Payer: Medicare Other | Admitting: Urology

## 2019-01-10 ENCOUNTER — Encounter: Payer: Self-pay | Admitting: Urology

## 2019-01-10 ENCOUNTER — Ambulatory Visit (INDEPENDENT_AMBULATORY_CARE_PROVIDER_SITE_OTHER): Payer: Medicare Other | Admitting: Internal Medicine

## 2019-01-10 VITALS — BP 159/99 | HR 92 | Ht 62.5 in | Wt 92.0 lb

## 2019-01-10 DIAGNOSIS — R300 Dysuria: Secondary | ICD-10-CM

## 2019-01-10 DIAGNOSIS — R3129 Other microscopic hematuria: Secondary | ICD-10-CM | POA: Diagnosis not present

## 2019-01-10 DIAGNOSIS — G4733 Obstructive sleep apnea (adult) (pediatric): Secondary | ICD-10-CM

## 2019-01-10 LAB — URINALYSIS, COMPLETE
Bilirubin, UA: NEGATIVE
Glucose, UA: NEGATIVE
Nitrite, UA: NEGATIVE
Protein,UA: NEGATIVE
Specific Gravity, UA: 1.015 (ref 1.005–1.030)
Urobilinogen, Ur: 0.2 mg/dL (ref 0.2–1.0)
pH, UA: 7 (ref 5.0–7.5)

## 2019-01-10 LAB — MICROSCOPIC EXAMINATION: WBC, UA: >30 /hpf — AB (ref 0–5)

## 2019-01-10 LAB — BLADDER SCAN AMB NON-IMAGING: Scan Result: 0

## 2019-01-15 ENCOUNTER — Telehealth: Payer: Self-pay

## 2019-01-15 LAB — CULTURE, URINE COMPREHENSIVE

## 2019-01-15 MED ORDER — CEFUROXIME AXETIL 250 MG PO TABS: 250.0000 mg | ORAL_TABLET | Freq: Two times a day (BID) | ORAL | 0 refills | Status: DC

## 2019-01-15 NOTE — Procedures (Signed)
[facility] [place], [place] [address]  Sleep Specialist: Allyne Gee, MD [place] Sleep Study Interpretation  Patient Name: Sabrina Zamora Patient MR [id number] DOB:Feb 21, 1931  Date of Study: January 10, 2019  Indications for study: Hypersomnia sleep apnea  BMI: 18 kg/m       Respiratory Data:  Total AHI: 7.8/h with a respiratory disturbance index of 11.7/h  Total Obstructive Apneas: 44  Total Central Apneas: 0  Total Mixed Apneas: 0  Total Hypopneas: 18  If the AHI is greater than 5 per hour patient qualifies for PAP evaluation  Oximetry Data:  Oxygen Desaturation Index: 7.1/h  Lowest Desaturation: 83%  Cardiac Data:  Minimum Heart Rate: 59  Maximum Heart Rate: 91   Impression / Diagnosis:  This apnea study is consistent with mild obstructive sleep apnea with an AHI of 7.8.  There is significant desaturation with the lowest saturation of 83%.  CPAP titration is recommended clinical correlation recommended.  GENERAL Recommendations:  1.  Consider Auto PAP with pressure ranges 5-20 cmH20 with download, or facility based PAP Titration Study  2.  Consider PAP interface mask fitted for patient comfort, Heated Humidification & PAP compliance monitoring (1 month, 3 months & 12 months after PAP initiation)  3. Consider treatment with mandibular advancement splint (MAS) or referral to an ENT surgeon for modification to the upper airway if the patient prefers an alternate therapy or the PAP trial is unsuccessful  4. Sleep hygiene measures should be discussed with the patient  5. Behavioral therapy such as weight reduction or smoking cessation as appropriate for the patient  6. Advise patient against the use of alcohol or sedatives in so much as these substances can worsen excessive daytime sleepiness and respiratory disturbances of sleep  7. Advise patient against participating in potentially dangerous activities while drowsy  such as operating a motor vehicle, heavy equipment or power tools as it can put them and others in danger  8. Advise patient of the long term consequences of OSA if left untreated, need for treatment and close follow up  9. Clinical follow up as deemed necessary     This Level III home sleep study was performed using the [employer], a 4 channel screening device subject to limitations. Depending on actual total sleep time, not measured in this study, the AHI (sum of apneas and hypopneas/hr of sleep) and therefore the severity of sleep apnea may be underestimated. As with any single night study, including Level 1 attended PSG, severity of sleep apnea may also be underestimated due to the lack of supine and/or REM sleep.  The interpretation associated with this report is based on normal values and degrees of severity in accordance with AASM parameters and/or estimated from multiple sources in the literature for adults ages 42-80+. These may not agree with the displayed values. The patient's treating physician should use the interpretation and recommendations in conjunction with the overall clinical evaluation and treatment of the patient.  Some of the terminology used in this scored Apnea[facility] report was developed several years ago and may not always be in accordance with current nomenclature. This in no way affects the accuracy of the data or the reliability of the interpretation and recommendations.

## 2019-01-15 NOTE — Telephone Encounter (Signed)
Patient notified, she states that she does not remember if she has taken these before and would like to contact her pharmacy and find out before we send anything in. She will call back later today

## 2019-01-15 NOTE — Telephone Encounter (Signed)
-----   Message from Nori Riis, PA-C sent at 01/15/2019  7:32 AM EDT ----- Please ask Mrs. Malinoski if she has taken Keflex (cephalexin) or Ceftin (cefuroxime) in the past?  She has a positive urine infection and she is either allergic to what the bacteria is sensitive to or the bacteria is resistant to what she is not allergic to?

## 2019-01-15 NOTE — Telephone Encounter (Signed)
Patient called back stating that she would like ceftin sent into her pharmacy, this was done

## 2019-01-17 ENCOUNTER — Other Ambulatory Visit: Payer: Self-pay

## 2019-01-17 MED ORDER — LEVOTHYROXINE SODIUM 25 MCG PO TABS: ORAL_TABLET | ORAL | 0 refills | Status: DC

## 2019-01-19 ENCOUNTER — Telehealth: Payer: Self-pay | Admitting: Urology

## 2019-01-19 NOTE — Telephone Encounter (Signed)
Returned call to patient. She states she has three more days of antibiotics and is feeling some better. She will call on Monday after finishing the course of antibiotics if she is not feeling better.

## 2019-01-19 NOTE — Telephone Encounter (Signed)
Pt called and wants to know if she could have 3 more days of Ceftin called into pharmacy, she says she is a little better but not completely healed from the UTI. Please advise.

## 2019-01-22 ENCOUNTER — Other Ambulatory Visit: Payer: Medicare Other

## 2019-01-22 ENCOUNTER — Other Ambulatory Visit: Payer: Self-pay

## 2019-01-22 ENCOUNTER — Telehealth: Payer: Self-pay

## 2019-01-22 DIAGNOSIS — R300 Dysuria: Secondary | ICD-10-CM

## 2019-01-22 NOTE — Telephone Encounter (Signed)
Pt called and states that she is still having burning with urination. She states that she would like 3 more days of ABX. Please advise.

## 2019-01-22 NOTE — Telephone Encounter (Signed)
ERROR

## 2019-01-22 NOTE — Telephone Encounter (Signed)
Called pt she states that she has completed her antibiotics however is still symptomatic. Pt notes mild dysuria only. Denies fevers, chills or flank pain. Pt states that overall she has improved but insists that she needs an additional 3 days of antibiotics as this is what she has had in the past. Advised pt that she may come into the office and drop off a urine specimen for ua & cx. Pt gave verbal understanding. Appt scheduled.

## 2019-01-23 LAB — URINALYSIS, COMPLETE
Bilirubin, UA: NEGATIVE
Glucose, UA: NEGATIVE
Ketones, UA: NEGATIVE
Nitrite, UA: NEGATIVE
Protein,UA: NEGATIVE
RBC, UA: NEGATIVE
Specific Gravity, UA: 1.02 (ref 1.005–1.030)
Urobilinogen, Ur: 0.2 mg/dL (ref 0.2–1.0)
pH, UA: 7 (ref 5.0–7.5)

## 2019-01-23 LAB — MICROSCOPIC EXAMINATION
Bacteria, UA: NONE SEEN
RBC, Urine: NONE SEEN /hpf (ref 0–2)

## 2019-01-25 LAB — CULTURE, URINE COMPREHENSIVE

## 2019-01-26 ENCOUNTER — Other Ambulatory Visit: Payer: Self-pay | Admitting: Urology

## 2019-01-26 ENCOUNTER — Telehealth: Payer: Self-pay

## 2019-01-26 MED ORDER — DOXYCYCLINE HYCLATE 100 MG PO CAPS: 100.0000 mg | ORAL_CAPSULE | Freq: Two times a day (BID) | ORAL | 0 refills | Status: AC

## 2019-01-26 NOTE — Telephone Encounter (Signed)
-----   Message from Abbie Sons, MD sent at 01/26/2019  7:21 AM EDT ----- Urine culture was positive with a different bacteria which was resistant to the previous antibiotic she was taking.  Rx doxycycline sent to pharmacy.

## 2019-01-26 NOTE — Telephone Encounter (Signed)
Pt informed

## 2019-01-26 NOTE — Telephone Encounter (Signed)
Spoke with patient and advised that the RX was sent in to her pharmacy.  [name]

## 2019-02-02 DIAGNOSIS — Z08 Encounter for follow-up examination after completed treatment for malignant neoplasm: Secondary | ICD-10-CM | POA: Diagnosis not present

## 2019-02-02 DIAGNOSIS — L84 Corns and callosities: Secondary | ICD-10-CM | POA: Diagnosis not present

## 2019-02-02 DIAGNOSIS — Z872 Personal history of diseases of the skin and subcutaneous tissue: Secondary | ICD-10-CM | POA: Diagnosis not present

## 2019-02-02 DIAGNOSIS — Z85820 Personal history of malignant melanoma of skin: Secondary | ICD-10-CM | POA: Diagnosis not present

## 2019-02-02 DIAGNOSIS — S90122A Contusion of left lesser toe(s) without damage to nail, initial encounter: Secondary | ICD-10-CM | POA: Diagnosis not present

## 2019-02-02 DIAGNOSIS — Z09 Encounter for follow-up examination after completed treatment for conditions other than malignant neoplasm: Secondary | ICD-10-CM | POA: Diagnosis not present

## 2019-02-02 DIAGNOSIS — Z85828 Personal history of other malignant neoplasm of skin: Secondary | ICD-10-CM | POA: Diagnosis not present

## 2019-02-06 ENCOUNTER — Other Ambulatory Visit: Payer: Self-pay

## 2019-02-06 ENCOUNTER — Ambulatory Visit (INDEPENDENT_AMBULATORY_CARE_PROVIDER_SITE_OTHER): Payer: Medicare Other | Admitting: Nurse Practitioner

## 2019-02-06 ENCOUNTER — Encounter: Payer: Self-pay | Admitting: Nurse Practitioner

## 2019-02-06 VITALS — BP 149/82 | HR 77 | Temp 98.6°F | Resp 16 | Ht 62.0 in | Wt 98.8 lb

## 2019-02-06 DIAGNOSIS — I10 Essential (primary) hypertension: Secondary | ICD-10-CM

## 2019-02-06 DIAGNOSIS — R319 Hematuria, unspecified: Secondary | ICD-10-CM | POA: Diagnosis not present

## 2019-02-06 DIAGNOSIS — N390 Urinary tract infection, site not specified: Secondary | ICD-10-CM

## 2019-02-06 DIAGNOSIS — R300 Dysuria: Secondary | ICD-10-CM | POA: Diagnosis not present

## 2019-02-06 LAB — POCT URINALYSIS DIPSTICK
Bilirubin, UA: NEGATIVE
Glucose, UA: NEGATIVE
Ketones, UA: NEGATIVE
Nitrite, UA: NEGATIVE
Protein, UA: NEGATIVE
Spec Grav, UA: <=1.005 — AB (ref 1.010–1.025)
Urobilinogen, UA: 0.2 E.U./dL
pH, UA: 7.5 (ref 5.0–8.0)

## 2019-02-06 IMAGING — MG MM BREAST LOCALIZATION CLIP
4 series · 4 of 4 positions shown · non-contrast
Comparison: Previous exam(s).

CLINICAL DATA: Confirmation of clip placement after
ultrasound-guided core needle biopsy of adjacent indeterminate
masses in the upper right breast.

EXAM:
DIAGNOSTIC RIGHT MAMMOGRAM POST ULTRASOUND BIOPSY

[R CC (1 of 2)]
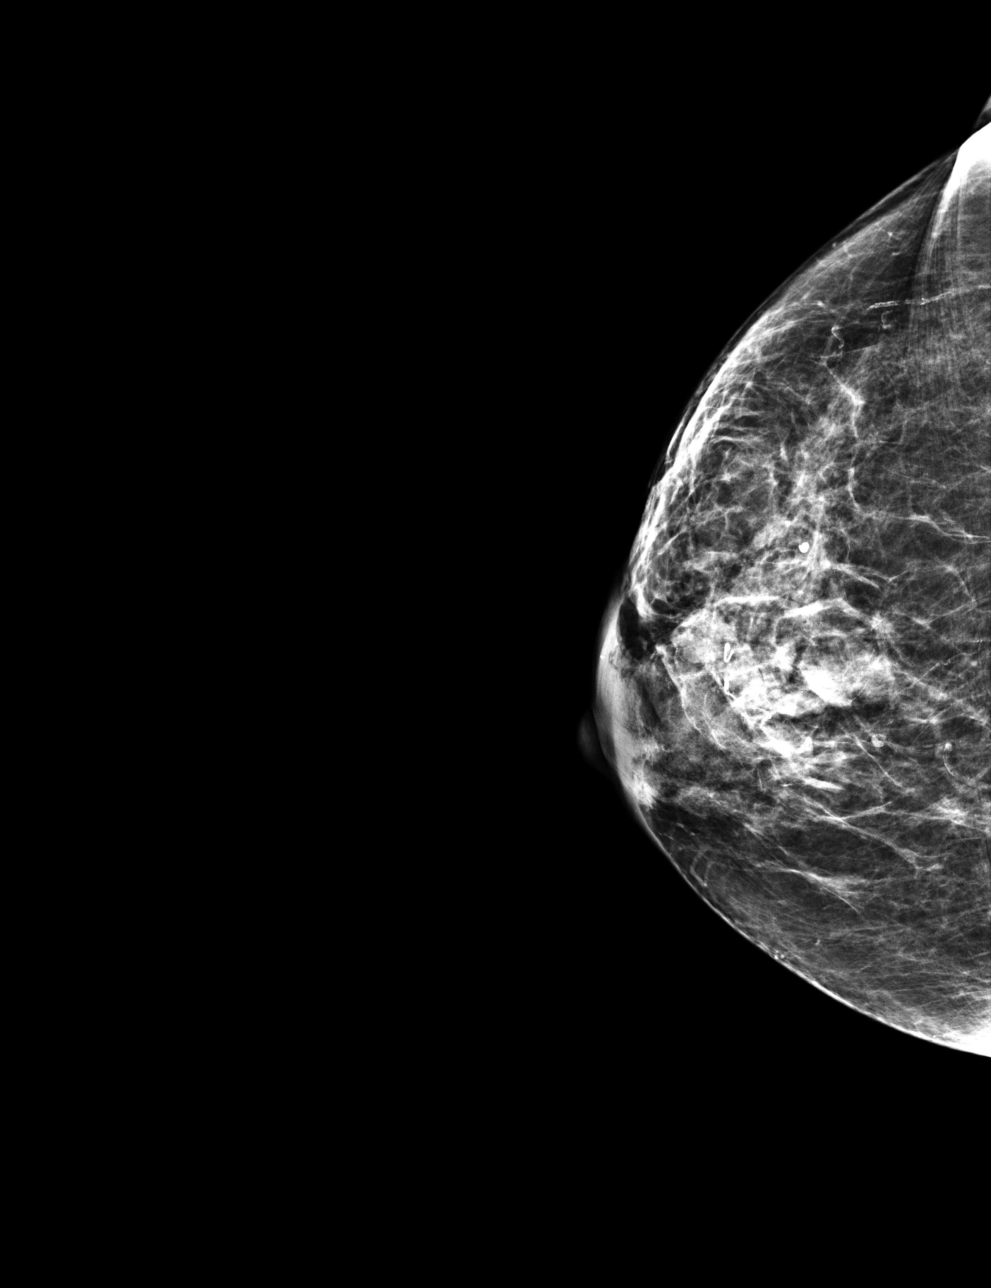

[R ML (1 of 2)]
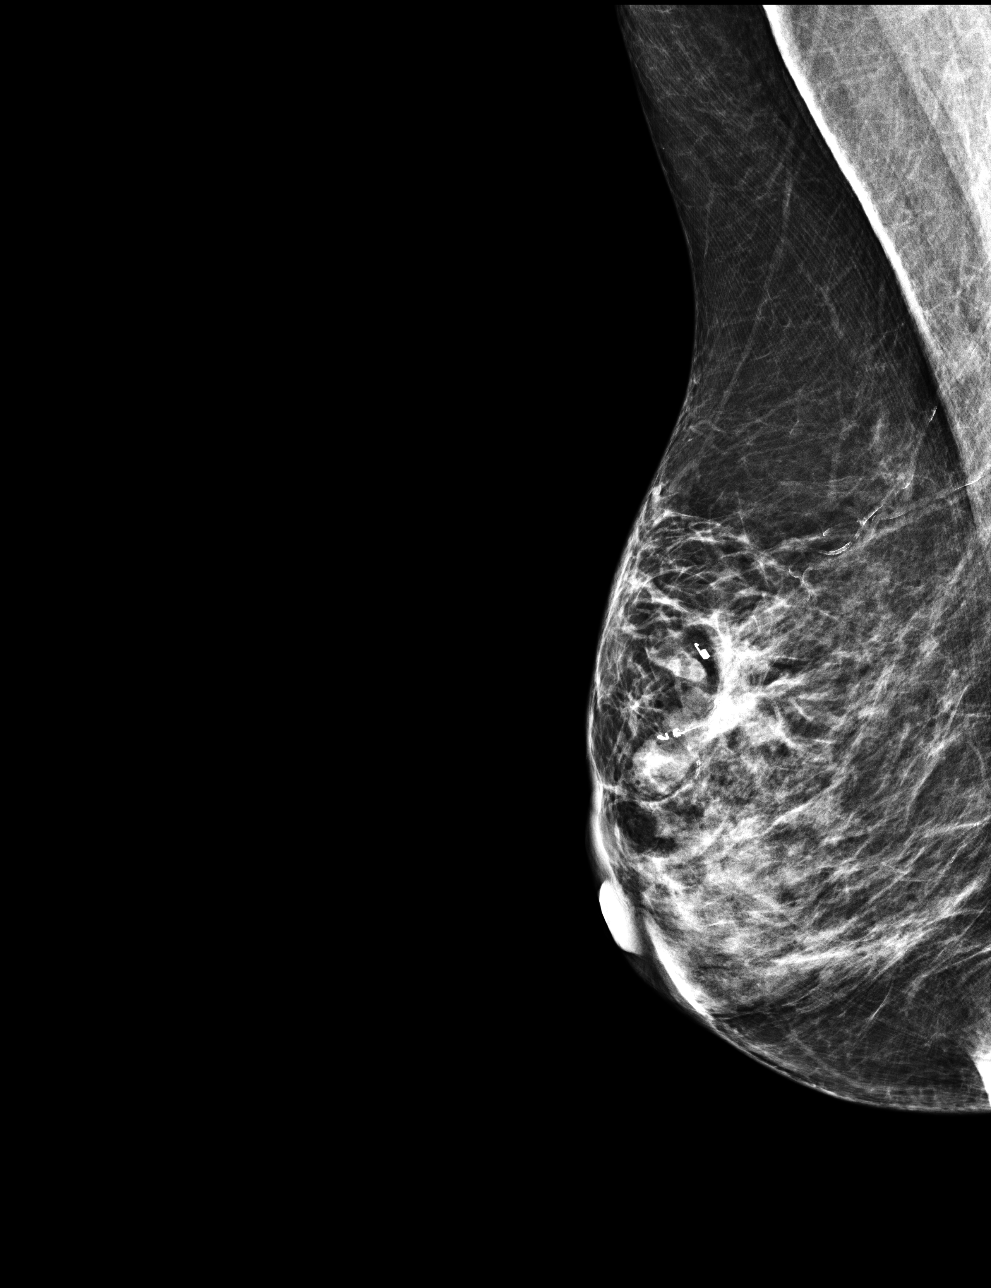

[R CC (2 of 2)]
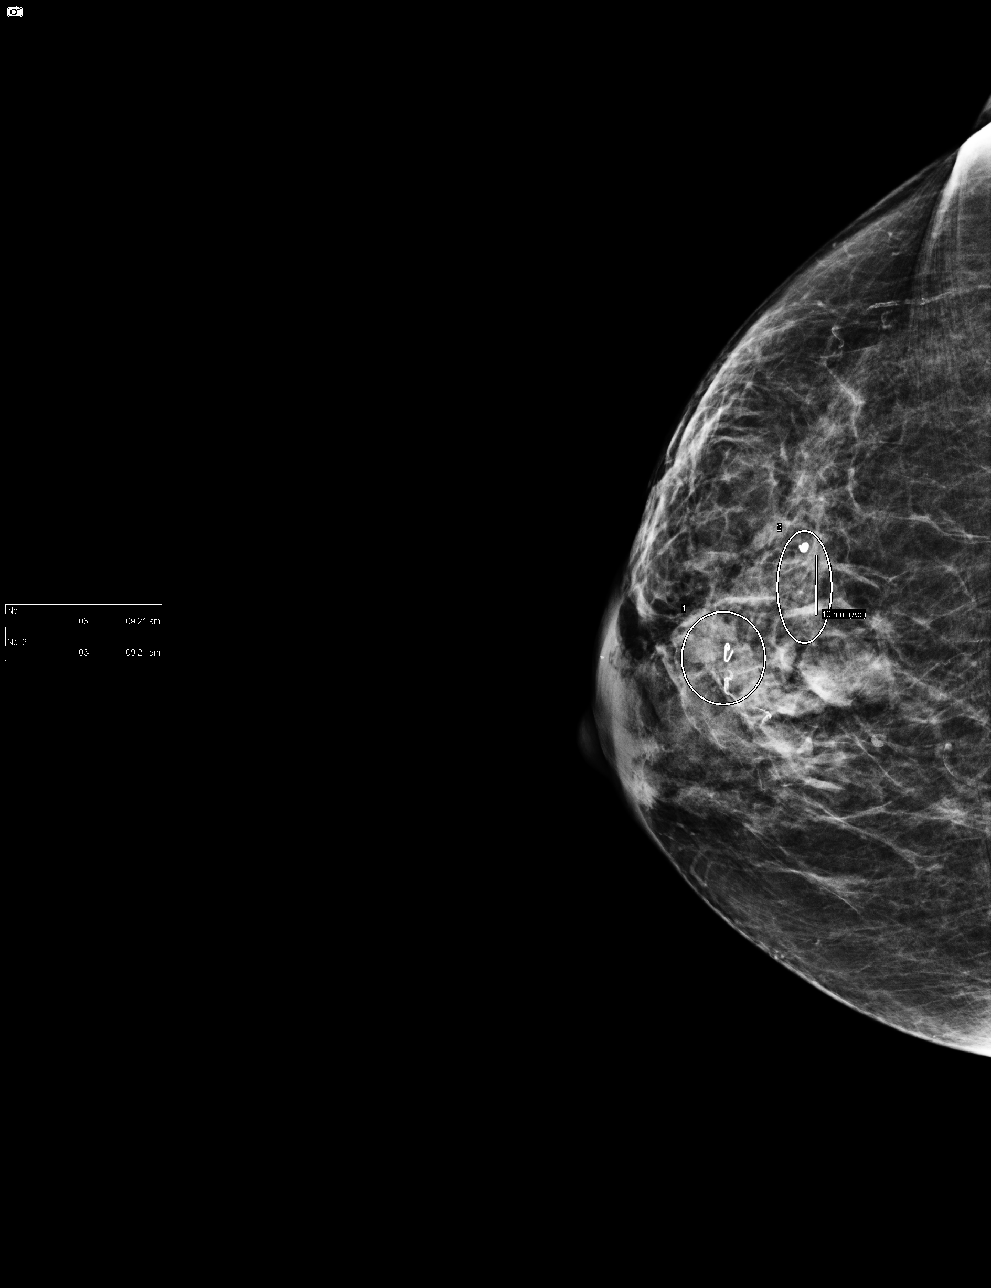

[R ML (2 of 2)]
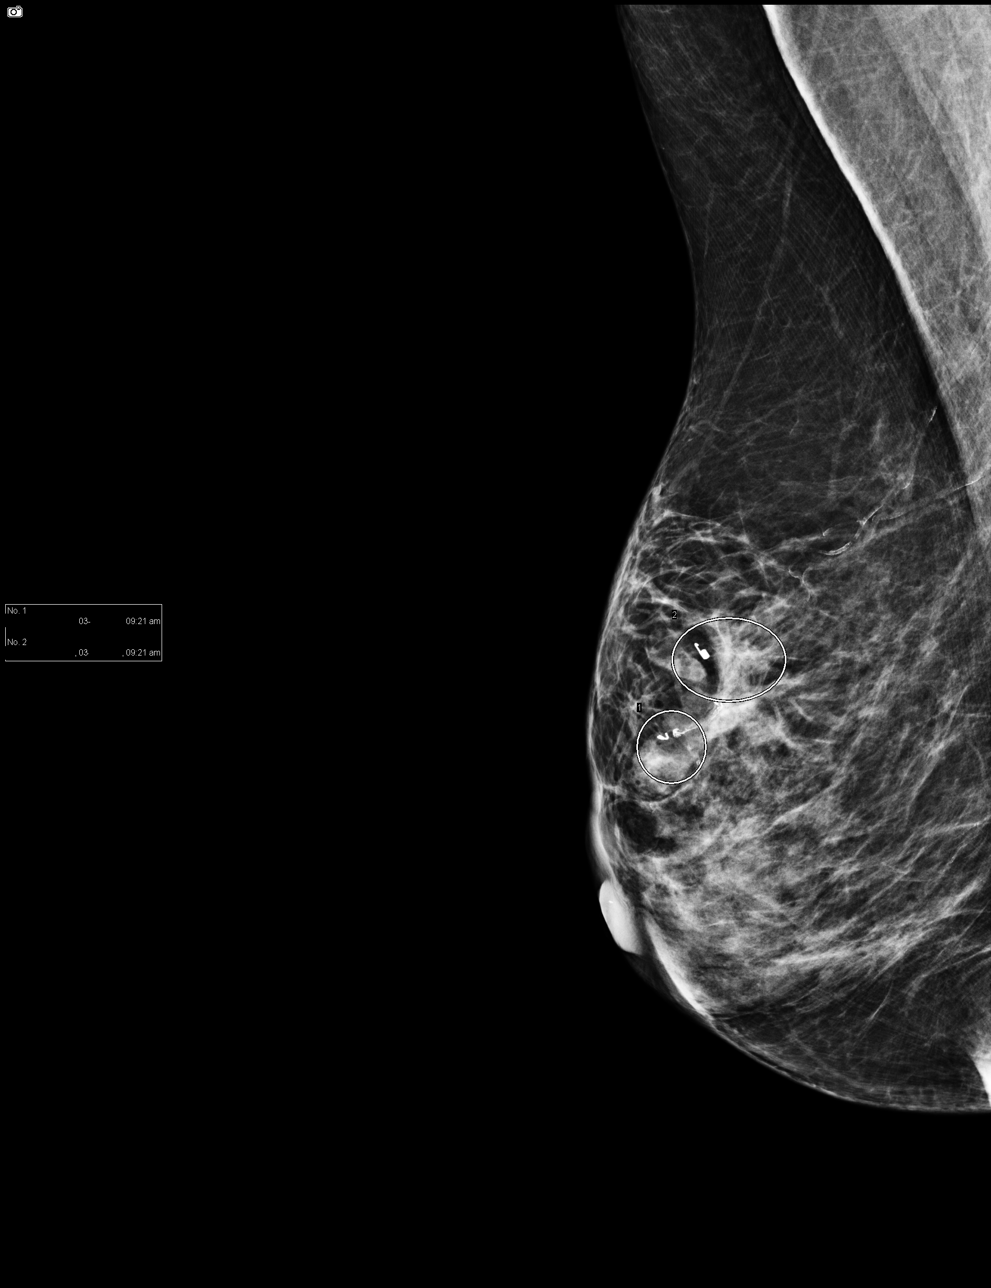

[4 of 4 positions shown; findings below may reference images not displayed]

FINDINGS: Mammographic images were obtained following ultrasound guided biopsy
of adjacent masses in the upper right breast.

The wing shaped tissue marker clip is appropriately positioned in
the slight upper outer quadrant at the site of the biopsied mass at
the approximate 12 o'clock position approximately 1 cm from the
nipple.

The coil shaped tissue marker clip migrated approximately 1 cm
lateral to the site of the biopsied mass in the slight upper outer
quadrant at the approximate 12 o'clock position approximately 3 cm
from the nipple.

Expected post biopsy changes are present without evidence of
hematoma.
IMPRESSION: 1. Appropriate positioning of the wing shaped tissue marker clip at
the site of the biopsied mass in the upper right breast at anterior
depth, 12 o'clock position approximately 1 cm from the nipple.
2. Approximate 1 cm lateral migration of the coil shaped tissue
marker clip relative to the biopsied mass in the upper right breast
at middle depth, 12 o'clock position approximately 3 cm from the
nipple.

Final Assessment: Post Procedure Mammograms for Marker Placement

## 2019-02-06 MED ORDER — NITROFURANTOIN MONOHYD MACRO 100 MG PO CAPS: 100.0000 mg | ORAL_CAPSULE | Freq: Two times a day (BID) | ORAL | 0 refills | Status: DC

## 2019-02-06 NOTE — Progress Notes (Signed)
[facility] [place], [place] [address]  Internal MEDICINE  Office Visit Note  Patient Name: Sabrina Zamora  [id number]  [id number]  Date of Service: 02/06/2019   Pt is here for a sick visit.  Chief Complaint  Patient presents with  . Urinary Tract Infection    burning sensation     The patient is here for new onset of UTI. She is having some burning with urination. This started in the beginning of July. She saw urologist. Started with seven days of cefuroxime. She did get some relief. A recheck showed she still had infection. Was then started on doxycycline. She took this twice daily for seven days. She finished this antibiotic as well. She still has some burning with urination. She did not go back for recheck. Her urine sample today is showing moderate WBC and trace blood. She denies belly or back pain. Denies nausea or vomiting.        Current Medication:  Outpatient Encounter Medications as of 02/06/2019  Medication Sig Note  . amLODipine (NORVASC) 2.5 MG tablet Take 1 to 2 tab po daily for blood pressure   . aspirin EC 81 MG tablet Take 81 mg by mouth. 02/04/2017: Takes every other day.  . Cholecalciferol (VITAMIN D PO) Take by mouth daily.  08/25/2018: Unsure of dose   . conjugated estrogens (PREMARIN) vaginal cream Place 1 Applicatorful vaginally daily. Use pea sized amount M-W-Fr before bedtime   . Fluticasone-Salmeterol (ADVAIR) 250-50 MCG/DOSE AEPB Inhale 1 puff into the lungs 2 (two) times daily.   Marland Kitchen levothyroxine (SYNTHROID) 25 MCG tablet Take 1.5 tab po Daily before breakfast   . lovastatin (MEVACOR) 20 MG tablet Take 1 tablet (20 mg total) by mouth at bedtime.   . Multiple Vitamin (MULTI-VITAMINS) TABS Take by mouth. 05/06/2015: [facility]ceived from: [facility]  . mupirocin nasal ointment (BACTROBAN) 2 % Place 1 application into the nose at bedtime.   Marland Kitchen omeprazole (PRILOSEC) 20 MG capsule Take 20 mg by mouth every other day.  05/06/2015:  Received from: [facility]  . Probiotic Product (PROBIOTIC DAILY PO) Take by mouth daily.   . nitrofurantoin, macrocrystal-monohydrate, (MACROBID) 100 MG capsule Take 1 capsule (100 mg total) by mouth 2 (two) times daily.    No facility-administered encounter medications on file as of 02/06/2019.       Medical History: Past Medical History:  Diagnosis Date  . Asthma   . Hypothyroidism   . Osteoporosis   . Skin cancer 2012, 2016    Today's Vitals   02/06/19 0920  BP: (!) 149/82  Pulse: 77  Resp: 16  Temp: 98.6 F (37 C)  SpO2: 97%  Weight: 98 lb 12.8 oz (44.8 kg)  Height: 5\' 2"  (1.575 m)   Body mass index is 18.07 kg/m.  Review of Systems  Constitutional: Negative for chills, diaphoresis, fatigue and unexpected weight change.  HENT: Negative for ear pain, postnasal drip, sinus pressure and sore throat.   Respiratory: Negative for cough, shortness of breath and wheezing.   Cardiovascular: Negative for chest pain, palpitations and leg swelling.  Gastrointestinal: Negative for abdominal pain, constipation, diarrhea, nausea and vomiting.  Endocrine: Negative for cold intolerance, heat intolerance, polydipsia and polyuria.  Genitourinary: Positive for dysuria, frequency and urgency.  Musculoskeletal: Negative for arthralgias, back pain, gait problem and neck pain.  Skin: Negative for color change and rash.  Allergic/Immunologic: Negative for environmental allergies and food allergies.  Neurological: Negative for dizziness, syncope and headaches.  Hematological: Does not bruise/bleed easily.  Psychiatric/Behavioral: Negative for agitation, behavioral problems (depression) and hallucinations. The patient is not nervous/anxious.     Physical Exam Vitals signs and nursing note reviewed.  Constitutional:      General: She is not in acute distress.    Appearance: Normal appearance. She is well-developed. She is not diaphoretic.  HENT:     Head: Normocephalic and  atraumatic.     Mouth/Throat:     Pharynx: No oropharyngeal exudate.  Eyes:     Pupils: Pupils are equal, round, and reactive to light.  Neck:     Musculoskeletal: Normal range of motion and neck supple.     Thyroid: No thyromegaly.     Vascular: No JVD.     Trachea: No tracheal deviation.  Cardiovascular:     Rate and Rhythm: Normal rate and regular rhythm.     Heart sounds: Normal heart sounds. No murmur. No friction rub. No gallop.   Pulmonary:     Effort: Pulmonary effort is normal. No respiratory distress.     Breath sounds: Normal breath sounds. No wheezing or rales.  Chest:     Chest wall: No tenderness.  Abdominal:     General: Bowel sounds are normal.     Palpations: Abdomen is soft.  Genitourinary:    Comments: Urine sample positive for moderate WBC and trace blood.  Musculoskeletal: Normal range of motion.  Lymphadenopathy:     Cervical: No cervical adenopathy.  Skin:    General: Skin is warm and dry.  Neurological:     Mental Status: She is alert and oriented to person, place, and time.     Cranial Nerves: No cranial nerve deficit.  Psychiatric:        Behavior: Behavior normal.        Thought Content: Thought content normal.        Judgment: Judgment normal.   Assessment/Plan: 1. Urinary tract infection with hematuria, site unspecified Treat based on most recent culture results on 01/22/2019. Treat with macrobid 100mg  twice daily for 10 days. Will adjust treatment based on new culture and sensitivity results - nitrofurantoin, macrocrystal-monohydrate, (MACROBID) 100 MG capsule; Take 1 capsule (100 mg total) by mouth 2 (two) times daily.  Dispense: 20 capsule; Refill: 0  2. Dysuria Treat based on most recent culture results on 01/22/2019. Treat with macrobid 100mg  twice daily for 10 days. Will adjust treatment based on new culture and sensitivity results - POCT Urinalysis Dipstick - CULTURE, URINE COMPREHENSIVE  3. Benign hypertension Stable. Continue bp  medication as prescribed   General Counseling: Sabrina Zamora verbalizes understanding of the findings of todays visit and agrees with plan of treatment. I have discussed any further diagnostic evaluation that may be needed or ordered today. We also reviewed her medications today. she has been encouraged to call the office with any questions or concerns that should arise related to todays visit.    Counseling:  Hypertension Counseling:   The following hypertensive lifestyle modification were recommended and discussed:  1. Limiting alcohol intake to less than 1 oz/day of ethanol:(24 oz of beer or 8 oz of wine or 2 oz of 100-proof whiskey). 2. Take baby ASA 81 mg daily. 3. Importance of regular aerobic exercise and losing weight. 4. Reduce dietary saturated fat and cholesterol intake for overall cardiovascular health. 5. Maintaining adequate dietary potassium, calcium, and magnesium intake. 6. Regular monitoring of the blood pressure. 7. Reduce sodium intake to less than 100 mmol/day (less than 2.3 gm of  sodium or less than 6 gm of sodium choride)   This patient was seen by [place] with Dr Lavera Guise as a part of collaborative care agreement  Orders Placed This Encounter  Procedures  . CULTURE, URINE COMPREHENSIVE  . POCT Urinalysis Dipstick    Meds ordered this encounter  Medications  . nitrofurantoin, macrocrystal-monohydrate, (MACROBID) 100 MG capsule    Sig: Take 1 capsule (100 mg total) by mouth 2 (two) times daily.    Dispense:  20 capsule    Refill:  0    Order Specific Question:   Supervising Provider    Answer:   Lavera Guise [[id number]]    Time spent: 25 Minutes

## 2019-02-08 ENCOUNTER — Other Ambulatory Visit: Payer: Medicare Other

## 2019-02-08 ENCOUNTER — Ambulatory Visit: Payer: Medicare Other | Admitting: Oncology

## 2019-02-08 LAB — CULTURE, URINE COMPREHENSIVE

## 2019-02-09 ENCOUNTER — Ambulatory Visit: Payer: Medicare Other | Admitting: Oncology

## 2019-02-09 ENCOUNTER — Ambulatory Visit: Payer: Medicare Other | Admitting: Nurse Practitioner

## 2019-02-12 ENCOUNTER — Telehealth: Payer: Self-pay

## 2019-02-12 ENCOUNTER — Other Ambulatory Visit: Payer: Self-pay

## 2019-02-12 MED ORDER — CEFUROXIME AXETIL 500 MG PO TABS: 500.0000 mg | ORAL_TABLET | Freq: Two times a day (BID) | ORAL | 0 refills | Status: DC

## 2019-02-12 NOTE — Telephone Encounter (Signed)
Pt advised stopped Macrobid she is resistant so as per heather send Ceftin 500 mg twice a day for 7 days

## 2019-02-13 ENCOUNTER — Encounter: Payer: Self-pay | Admitting: Internal Medicine

## 2019-02-19 ENCOUNTER — Telehealth: Payer: Self-pay

## 2019-02-19 ENCOUNTER — Other Ambulatory Visit: Payer: Self-pay | Admitting: Nurse Practitioner

## 2019-02-19 DIAGNOSIS — N390 Urinary tract infection, site not specified: Secondary | ICD-10-CM

## 2019-02-19 MED ORDER — CEFUROXIME AXETIL 500 MG PO TABS: 500.0000 mg | ORAL_TABLET | Freq: Two times a day (BID) | ORAL | 0 refills | Status: DC

## 2019-02-19 NOTE — Telephone Encounter (Signed)
Will try additional seven days of cefuroxime twice daily. If symptoms still persist, she will have to be seen .

## 2019-02-19 NOTE — Telephone Encounter (Signed)
Pt said that she has taken cerforoximine.

## 2019-02-19 NOTE — Telephone Encounter (Signed)
Pt was notified.  

## 2019-02-19 NOTE — Progress Notes (Signed)
Will try additional seven days of cefuroxime twice daily. If symptoms still persist, she will have to be seen .

## 2019-02-19 NOTE — Telephone Encounter (Signed)
From what I can see, she should be on antibiotics for this. Changed them to ceforoxime last week and should not be finished until today. Did she ever get this new antibiotic?

## 2019-02-23 ENCOUNTER — Other Ambulatory Visit: Payer: Self-pay

## 2019-02-23 ENCOUNTER — Inpatient Hospital Stay: Payer: Medicare Other | Attending: Oncology

## 2019-02-23 ENCOUNTER — Ambulatory Visit (INDEPENDENT_AMBULATORY_CARE_PROVIDER_SITE_OTHER): Payer: Medicare Other | Admitting: Nurse Practitioner

## 2019-02-23 ENCOUNTER — Encounter: Payer: Self-pay | Admitting: Nurse Practitioner

## 2019-02-23 VITALS — BP 180/90 | HR 80 | Temp 98.3°F | Resp 16 | Ht 62.0 in | Wt 98.6 lb

## 2019-02-23 DIAGNOSIS — D751 Secondary polycythemia: Secondary | ICD-10-CM | POA: Diagnosis not present

## 2019-02-23 DIAGNOSIS — Z79899 Other long term (current) drug therapy: Secondary | ICD-10-CM | POA: Insufficient documentation

## 2019-02-23 DIAGNOSIS — I10 Essential (primary) hypertension: Secondary | ICD-10-CM | POA: Diagnosis not present

## 2019-02-23 DIAGNOSIS — G4733 Obstructive sleep apnea (adult) (pediatric): Secondary | ICD-10-CM | POA: Diagnosis not present

## 2019-02-23 DIAGNOSIS — R300 Dysuria: Secondary | ICD-10-CM | POA: Diagnosis not present

## 2019-02-23 DIAGNOSIS — Z85820 Personal history of malignant melanoma of skin: Secondary | ICD-10-CM | POA: Diagnosis not present

## 2019-02-23 LAB — CBC WITH DIFFERENTIAL/PLATELET
Abs Immature Granulocytes: 0.04 10*3/uL (ref 0.00–0.07)
Basophils Absolute: 0.1 10*3/uL (ref 0.0–0.1)
Basophils Relative: 1 %
Eosinophils Absolute: 0.1 10*3/uL (ref 0.0–0.5)
Eosinophils Relative: 3 %
HCT: 46.8 % — ABNORMAL HIGH (ref 36.0–46.0)
Hemoglobin: 15.3 g/dL — ABNORMAL HIGH (ref 12.0–15.0)
Immature Granulocytes: 1 %
Lymphocytes Relative: 42 %
Lymphs Abs: 2 10*3/uL (ref 0.7–4.0)
MCH: 30.8 pg (ref 26.0–34.0)
MCHC: 32.7 g/dL (ref 30.0–36.0)
MCV: 94.2 fL (ref 80.0–100.0)
Monocytes Absolute: 0.5 10*3/uL (ref 0.1–1.0)
Monocytes Relative: 11 %
Neutro Abs: 2 10*3/uL (ref 1.7–7.7)
Neutrophils Relative %: 42 %
Platelets: 205 10*3/uL (ref 150–400)
RBC: 4.97 MIL/uL (ref 3.87–5.11)
RDW: 13.5 % (ref 11.5–15.5)
WBC: 4.7 10*3/uL (ref 4.0–10.5)
nRBC: 0 % (ref 0.0–0.2)

## 2019-02-23 LAB — COMPREHENSIVE METABOLIC PANEL
ALT: 28 U/L (ref 0–44)
AST: 34 U/L (ref 15–41)
Albumin: 3.8 g/dL (ref 3.5–5.0)
Alkaline Phosphatase: 50 U/L (ref 38–126)
Anion gap: 8 (ref 5–15)
BUN: 16 mg/dL (ref 8–23)
CO2: 29 mmol/L (ref 22–32)
Calcium: 9.3 mg/dL (ref 8.9–10.3)
Chloride: 101 mmol/L (ref 98–111)
Creatinine, Ser: 0.65 mg/dL (ref 0.44–1.00)
GFR calc Af Amer: >60 mL/min (ref >60)
GFR calc non Af Amer: >60 mL/min (ref >60)
Glucose, Bld: 127 mg/dL — ABNORMAL HIGH (ref 70–99)
Potassium: 4.3 mmol/L (ref 3.5–5.1)
Sodium: 138 mmol/L (ref 135–145)
Total Bilirubin: 0.6 mg/dL (ref 0.3–1.2)
Total Protein: 7.1 g/dL (ref 6.5–8.1)

## 2019-02-23 LAB — FERRITIN: Ferritin: 27 ng/mL (ref 11–307)

## 2019-02-23 LAB — POCT URINALYSIS DIPSTICK
Bilirubin, UA: NEGATIVE
Blood, UA: NEGATIVE
Glucose, UA: NEGATIVE
Ketones, UA: NEGATIVE
Leukocytes, UA: NEGATIVE
Nitrite, UA: NEGATIVE
Protein, UA: NEGATIVE
Spec Grav, UA: 1.015 (ref 1.010–1.025)
Urobilinogen, UA: 0.2 E.U./dL
pH, UA: 6 (ref 5.0–8.0)

## 2019-02-23 LAB — IRON AND TIBC
Iron: 71 ug/dL (ref 28–170)
Saturation Ratios: 20 % (ref 10.4–31.8)
TIBC: 360 ug/dL (ref 250–450)
UIBC: 289 ug/dL

## 2019-02-23 NOTE — Progress Notes (Signed)
[facility] [place], [place] [address]  Internal MEDICINE  Office Visit Note  Patient Name: Sabrina Zamora  [id number]  [id number]  Date of Service: 02/23/2019  Chief Complaint  Patient presents with  . Follow-up    re check urine post uti  . Urinary Tract Infection    a little burning during urination    The patient is here for follow up of urinary tract infection. She is on second round antibiotics. She does admit to having a little burning at the end of the urine stream. She is feeling much better. Urine sample is normal today.  Her blood pressure is moderately elevated today. She states that she had appointment at the cancer center this morning and was afraid she would be late for this appointment. She did not take blood pressure medication today.       Current Medication: Outpatient Encounter Medications as of 02/23/2019  Medication Sig Note  . amLODipine (NORVASC) 2.5 MG tablet Take 1 to 2 tab po daily for blood pressure   . aspirin EC 81 MG tablet Take 81 mg by mouth. 02/04/2017: Takes every other day.  . cefUROXime (CEFTIN) 500 MG tablet Take 1 tablet (500 mg total) by mouth 2 (two) times daily with a meal for 7 days.   . Cholecalciferol (VITAMIN D PO) Take by mouth daily.  08/25/2018: Unsure of dose   . conjugated estrogens (PREMARIN) vaginal cream Place 1 Applicatorful vaginally daily. Use pea sized amount M-W-Fr before bedtime   . Fluticasone-Salmeterol (ADVAIR) 250-50 MCG/DOSE AEPB Inhale 1 puff into the lungs 2 (two) times daily.   Marland Kitchen levothyroxine (SYNTHROID) 25 MCG tablet Take 1.5 tab po Daily before breakfast   . lovastatin (MEVACOR) 20 MG tablet Take 1 tablet (20 mg total) by mouth at bedtime.   . Multiple Vitamin (MULTI-VITAMINS) TABS Take by mouth. 05/06/2015: Received from: [facility]  . mupirocin nasal ointment (BACTROBAN) 2 % Place 1 application into the nose at bedtime.   Marland Kitchen omeprazole (PRILOSEC) 20 MG capsule Take 20 mg by mouth  every other day.  05/06/2015: Received from: [facility]  . Probiotic Product (PROBIOTIC DAILY PO) Take by mouth daily.   . Zinc 25 MG TABS Take by mouth daily.    No facility-administered encounter medications on file as of 02/23/2019.     Surgical History: Past Surgical History:  Procedure Laterality Date  . BREAST BIOPSY Left 2003   neg  . BREAST BIOPSY Right 09/27/2017   12:00 1 cmfn fibroadenomatous change   . BREAST BIOPSY Right 09/17/2017   12:00 3 cmfn benign breast and fibroadipose tissue  . BREAST EXCISIONAL BIOPSY Left 1988   excisional - neg  . CATARACT EXTRACTION W/PHACO Left 09/20/2017   Procedure: CATARACT EXTRACTION PHACO AND INTRAOCULAR LENS PLACEMENT ([place]) LEFT;  Surgeon: Leandrew Koyanagi, MD;  Location: [place];  Service: Ophthalmology;  Laterality: Left;  . CATARACT EXTRACTION W/PHACO Right 02/15/2018   Procedure: CATARACT EXTRACTION PHACO AND INTRAOCULAR LENS PLACEMENT ([place]) TOPICAL  RIGHT;  Surgeon: Leandrew Koyanagi, MD;  Location: [place];  Service: Ophthalmology;  Laterality: Right;  prefers early  . CHOLECYSTECTOMY      Medical History: Past Medical History:  Diagnosis Date  . Asthma   . Hypothyroidism   . Osteoporosis   . Skin cancer 2012, 2016    Family History: Family History  Problem Relation Age of Onset  . Clotting disorder Sister   . Breast cancer Sister 7  Social History   Socioeconomic History  . Marital status: Married    Spouse name: Not on file  . Number of children: Not on file  . Years of education: Not on file  . Highest education level: Not on file  Occupational History  . Not on file  Social Needs  . Financial resource strain: Not on file  . Food insecurity    Worry: Not on file    Inability: Not on file  . Transportation needs    Medical: Not on file    Non-medical: Not on file  Tobacco Use  . Smoking status: Never Smoker  . Smokeless tobacco: Never Used  Substance and Sexual  Activity  . Alcohol use: Never    Alcohol/week: 0.0 standard drinks    Frequency: Never  . Drug use: Never  . Sexual activity: Not on file  Lifestyle  . Physical activity    Days per week: Not on file    Minutes per session: Not on file  . Stress: Not on file  Relationships  . Social Herbalist on phone: Not on file    Gets together: Not on file    Attends religious service: Not on file    Active member of club or organization: Not on file    Attends meetings of clubs or organizations: Not on file    Relationship status: Not on file  . Intimate partner violence    Fear of current or ex partner: Not on file    Emotionally abused: Not on file    Physically abused: Not on file    Forced sexual activity: Not on file  Other Topics Concern  . Not on file  Social History Narrative  . Not on file      Review of Systems  Constitutional: Negative for chills, diaphoresis, fatigue and unexpected weight change.  HENT: Negative for ear pain, postnasal drip, sinus pressure and sore throat.   Respiratory: Negative for cough, shortness of breath and wheezing.   Cardiovascular: Negative for chest pain, palpitations and leg swelling.       Elevated blood pressure today.  Gastrointestinal: Negative for abdominal pain, constipation, diarrhea, nausea and vomiting.  Endocrine: Negative for cold intolerance, heat intolerance, polydipsia and polyuria.  Genitourinary: Positive for dysuria. Negative for frequency and urgency.  Musculoskeletal: Negative for arthralgias, back pain, gait problem and neck pain.  Skin: Negative for color change and rash.  Allergic/Immunologic: Negative for environmental allergies and food allergies.  Neurological: Negative for dizziness, syncope and headaches.  Hematological: Does not bruise/bleed easily.  Psychiatric/Behavioral: Negative for agitation, behavioral problems (depression) and hallucinations. The patient is not nervous/anxious.    Today's  Vitals   02/23/19 0958 02/23/19 1011  BP: (!) 183/94 (!) 180/90  Pulse: 80 80  Resp: 16   Temp: 98.3 F (36.8 C)   SpO2: 98% 99%  Weight: 98 lb 9.6 oz (44.7 kg)   Height: 5\' 2"  (1.575 m)    Body mass index is 18.03 kg/m.  Physical Exam Vitals signs and nursing note reviewed.  Constitutional:      General: She is not in acute distress.    Appearance: Normal appearance. She is well-developed. She is not diaphoretic.  HENT:     Head: Normocephalic and atraumatic.     Mouth/Throat:     Pharynx: No oropharyngeal exudate.  Eyes:     Pupils: Pupils are equal, round, and reactive to light.  Neck:     Musculoskeletal: Normal range  of motion and neck supple.     Thyroid: No thyromegaly.     Vascular: No JVD.     Trachea: No tracheal deviation.  Cardiovascular:     Rate and Rhythm: Normal rate and regular rhythm.     Heart sounds: Normal heart sounds. No murmur. No friction rub. No gallop.   Pulmonary:     Effort: Pulmonary effort is normal. No respiratory distress.     Breath sounds: Normal breath sounds. No wheezing or rales.  Chest:     Chest wall: No tenderness.  Abdominal:     Palpations: Abdomen is soft.  Genitourinary:    Comments: Urine samples is normal today. Musculoskeletal: Normal range of motion.  Lymphadenopathy:     Cervical: No cervical adenopathy.  Skin:    General: Skin is warm and dry.  Neurological:     Mental Status: She is alert and oriented to person, place, and time.     Cranial Nerves: No cranial nerve deficit.  Psychiatric:        Behavior: Behavior normal.        Thought Content: Thought content normal.        Judgment: Judgment normal.    Assessment/Plan: 1. Dysuria UTI resolved. Continue antibiotic until entirely gone. Notify office if new symptoms develop.  - POCT Urinalysis Dipstick  2. Essential hypertension, benign Blood pressure elevated today as she has not taken her blood pressure medication. Will take this as soon as she gets  home. Will monitor BP closely.   General Counseling: Evanne verbalizes understanding of the findings of todays visit and agrees with plan of treatment. I have discussed any further diagnostic evaluation that may be needed or ordered today. We also reviewed her medications today. she has been encouraged to call the office with any questions or concerns that should arise related to todays visit.  Hypertension Counseling:   The following hypertensive lifestyle modification were recommended and discussed:  1. Limiting alcohol intake to less than 1 oz/day of ethanol:(24 oz of beer or 8 oz of wine or 2 oz of 100-proof whiskey). 2. Take baby ASA 81 mg daily. 3. Importance of regular aerobic exercise and losing weight. 4. Reduce dietary saturated fat and cholesterol intake for overall cardiovascular health. 5. Maintaining adequate dietary potassium, calcium, and magnesium intake. 6. Regular monitoring of the blood pressure. 7. Reduce sodium intake to less than 100 mmol/day (less than 2.3 gm of sodium or less than 6 gm of sodium choride)   This patient was seen by [place] with Dr Lavera Guise as a part of collaborative care agreement  Orders Placed This Encounter  Procedures  . POCT Urinalysis Dipstick     Time spent: 9 Minutes      Dr Lavera Guise Internal medicine

## 2019-02-26 ENCOUNTER — Inpatient Hospital Stay: Payer: Medicare Other

## 2019-02-26 ENCOUNTER — Inpatient Hospital Stay (HOSPITAL_BASED_OUTPATIENT_CLINIC_OR_DEPARTMENT_OTHER): Payer: Medicare Other | Admitting: Oncology

## 2019-02-26 ENCOUNTER — Other Ambulatory Visit: Payer: Self-pay

## 2019-02-26 ENCOUNTER — Encounter: Payer: Self-pay | Admitting: Oncology

## 2019-02-26 VITALS — BP 156/75 | HR 75 | Temp 97.3°F | Resp 16 | Ht 62.0 in | Wt 95.1 lb

## 2019-02-26 DIAGNOSIS — Z79899 Other long term (current) drug therapy: Secondary | ICD-10-CM | POA: Diagnosis not present

## 2019-02-26 DIAGNOSIS — Z85820 Personal history of malignant melanoma of skin: Secondary | ICD-10-CM | POA: Diagnosis not present

## 2019-02-26 DIAGNOSIS — D751 Secondary polycythemia: Secondary | ICD-10-CM

## 2019-02-26 DIAGNOSIS — G4733 Obstructive sleep apnea (adult) (pediatric): Secondary | ICD-10-CM | POA: Diagnosis not present

## 2019-02-26 NOTE — Progress Notes (Signed)
[facility] day:  02/26/19  Chief Complaint: Sabrina Zamora is a 85 y.o. female with a history of secondary polycythemia presents for follow up .   HPI:  The patient was last seen in the medical oncology clinic on 02/04/2017.  At that time, she felt good.  Exam was unremarkable.  Hematocrit was 46.1 and hemoglobin 15.6.  She had mot required a phlebotomy since 04/2015.  Follow-up appointments were spread out.  CBC was to be checked every 6 months.   Patient previously followed up with Dr. Mike Gip.  Patient switched care to me on 11/29/2018. Extensive chart review was performed.  #Secondary polycythemia Patient has a history of sleep apnea she wears oxygen at night but no CPAP mask.  Never smoker. Work-up on 01/11/2012 reviewed and negative Jak 2 [id number] mutation, exon 12 mutation. 08/25/2018 CALR negative, JAK 2 E12-15 negative, MPL negative.  Patient has been on phlebotomy program to keep hematocrit less than 48 and then later to keep it less than 50.  #History of melanoma x3.  T1 a melanoma in August 2012.    INTERVAL HISTORY Sabrina Zamora is a 85 y.o. female who has above history reviewed by me today presents for follow up visit for management of secondary erythrocytosis. Problems and complaints are listed below: Today patient's reports feeling well at her baseline. Denies any shortness of breath, chest pain, abdominal pain, nausea vomiting. She started to use nighttime oxygen since May, after last visit.    Review of Systems  Constitutional: Negative for appetite change, chills, fatigue and fever.  HENT:   Negative for hearing loss and voice change.   Eyes: Negative for eye problems.  Respiratory: Negative for chest tightness and cough.   Cardiovascular: Negative for chest pain.  Gastrointestinal: Negative for abdominal distention, abdominal pain and blood in stool.  Endocrine: Negative for hot flashes.  Genitourinary: Negative for  difficulty urinating and frequency.   Musculoskeletal: Negative for arthralgias.  Skin: Negative for itching and rash.  Neurological: Negative for extremity weakness.  Hematological: Negative for adenopathy.  Psychiatric/Behavioral: Negative for confusion.    Past Medical History:  Diagnosis Date  . Asthma   . Hypothyroidism   . Osteoporosis   . Skin cancer 2012, 2016    Past Surgical History:  Procedure Laterality Date  . BREAST BIOPSY Left 2003   neg  . BREAST BIOPSY Right 09/27/2017   12:00 1 cmfn fibroadenomatous change   . BREAST BIOPSY Right 09/17/2017   12:00 3 cmfn benign breast and fibroadipose tissue  . BREAST EXCISIONAL BIOPSY Left 1988   excisional - neg  . CATARACT EXTRACTION W/PHACO Left 09/20/2017   Procedure: CATARACT EXTRACTION PHACO AND INTRAOCULAR LENS PLACEMENT ([place]) LEFT;  Surgeon: Leandrew Koyanagi, MD;  Location: [place];  Service: Ophthalmology;  Laterality: Left;  . CATARACT EXTRACTION W/PHACO Right 02/15/2018   Procedure: CATARACT EXTRACTION PHACO AND INTRAOCULAR LENS PLACEMENT ([place]) TOPICAL  RIGHT;  Surgeon: Leandrew Koyanagi, MD;  Location: [place];  Service: Ophthalmology;  Laterality: Right;  prefers early  . CHOLECYSTECTOMY      Family History  Problem Relation Age of Onset  . Clotting disorder Sister   . Breast cancer Sister 110    Social History:  reports that she has never smoked. She has never used smokeless tobacco. She reports that she does not drink alcohol or use drugs.  She has never smoked.  The patient is alone today.  Allergies:  Allergies  Allergen Reactions  . Levofloxacin Rash and Shortness Of Breath  . Penicillins Hives and Rash  . Betaine Rash  . Ciprofloxacin Itching and Rash  . Sulfa Antibiotics Rash    Current Medications: Current Outpatient Medications  Medication Sig Dispense Refill  . amLODipine (NORVASC) 2.5 MG tablet Take 1 to 2 tab po daily for blood pressure 45 tablet 5  .  aspirin EC 81 MG tablet Take 81 mg by mouth.    . cefUROXime (CEFTIN) 500 MG tablet Take 1 tablet (500 mg total) by mouth 2 (two) times daily with a meal for 7 days. 14 tablet 0  . Cholecalciferol (VITAMIN D PO) Take by mouth daily.     Marland Kitchen conjugated estrogens (PREMARIN) vaginal cream Place 1 Applicatorful vaginally daily. Use pea sized amount M-W-Fr before bedtime 42.5 g 3  . Fluticasone-Salmeterol (ADVAIR) 250-50 MCG/DOSE AEPB Inhale 1 puff into the lungs 2 (two) times daily. 60 each 3  . levothyroxine (SYNTHROID) 25 MCG tablet Take 1.5 tab po Daily before breakfast 135 tablet 0  . lovastatin (MEVACOR) 20 MG tablet Take 1 tablet (20 mg total) by mouth at bedtime. 90 tablet 2  . Multiple Vitamin (MULTI-VITAMINS) TABS Take by mouth.    . mupirocin nasal ointment (BACTROBAN) 2 % Place 1 application into the nose at bedtime. 10 g 1  . omeprazole (PRILOSEC) 20 MG capsule Take 20 mg by mouth every other day.     . Probiotic Product (PROBIOTIC DAILY PO) Take by mouth daily.    . Zinc 25 MG TABS Take by mouth daily.     No current facility-administered medications for this visit.     Physical Exam: Blood pressure (!) 156/75, pulse 75, temperature (!) 97.3 F (36.3 C), temperature source Tympanic, resp. rate 16, height 5\' 2"  (1.575 m), weight 95 lb 1 oz (43.1 kg).  Physical Exam  Constitutional: She is oriented to person, place, and time. No distress.  thin  HENT:  Head: Normocephalic and atraumatic.  Nose: Nose normal.  Mouth/Throat: Oropharynx is clear and moist. No oropharyngeal exudate.  Eyes: Pupils are equal, round, and reactive to light. EOM are normal. No scleral icterus.  Neck: Normal range of motion. Neck supple.  Cardiovascular: Normal rate and regular rhythm.  No murmur heard. Pulmonary/Chest: Effort normal. No respiratory distress. She has no rales. She exhibits no tenderness.  Abdominal: Soft. She exhibits no distension. There is no abdominal tenderness.  Musculoskeletal: Normal  range of motion.        General: No edema.  Neurological: She is alert and oriented to person, place, and time. No cranial nerve deficit. She exhibits normal muscle tone. Coordination normal.  Skin: Skin is warm and dry. She is not diaphoretic. No erythema.  Psychiatric: Affect normal.     No visits with results within 3 Day(s) from this visit.  Latest known visit with results is:  Office Visit on 02/23/2019  Component Date Value Ref Range Status  . Glucose, UA 02/23/2019 Negative  Negative Final  . Bilirubin, UA 02/23/2019 NEGATIVE   Final  . Ketones, UA 02/23/2019 negative   Final  . Spec Grav, UA 02/23/2019 1.015  1.010 - 1.025 Final  . Blood, UA 02/23/2019 negative   Final  . pH, UA 02/23/2019 6.0  5.0 - 8.0 Final  . Protein, UA 02/23/2019 Negative  Negative Final  . Urobilinogen, UA 02/23/2019 0.2  0.2 or 1.0 E.U./dL Final  . Nitrite, UA 02/23/2019 negative   Final  . Leukocytes, UA 02/23/2019  Negative  Negative Final    Assessment:  Sabrina Zamora is a 85 y.o. female present for follow up of erythrocytosis.  1. Erythrocytosis   Labs are reviewed and discussed with patient. Erythrocytosis secondary to OSA Hematocrit less than 50, hold phlebotomy. Recommend patient to repeat H&H in 2 months with possible phlebotomy if hematocrit above 50. Follow-up in 4 months with me repeat labs and a possible phlebotomy  Advice sleep study.  She will discuss with primary care physician and pulmonology.  We spent sufficient time to discuss many aspect of care, questions were answered to patient's satisfaction. Total face to face encounter time for this patient visit was 15 min. >50% of the time was  spent in counseling and coordination of care.    Earlie Server, MD  02/26/2019, 2:14 PM

## 2019-02-26 NOTE — Progress Notes (Signed)
Patient here today for follow up.  Patient states no new concerns today  

## 2019-02-28 ENCOUNTER — Other Ambulatory Visit: Payer: Medicare Other

## 2019-03-02 ENCOUNTER — Ambulatory Visit: Payer: Medicare Other | Admitting: Oncology

## 2019-03-13 ENCOUNTER — Other Ambulatory Visit: Payer: Self-pay

## 2019-03-13 MED ORDER — LEVOTHYROXINE SODIUM 25 MCG PO TABS: ORAL_TABLET | ORAL | 0 refills | Status: DC

## 2019-03-26 DIAGNOSIS — M810 Age-related osteoporosis without current pathological fracture: Secondary | ICD-10-CM | POA: Diagnosis not present

## 2019-04-20 DIAGNOSIS — X32XXXA Exposure to sunlight, initial encounter: Secondary | ICD-10-CM | POA: Diagnosis not present

## 2019-04-20 DIAGNOSIS — L820 Inflamed seborrheic keratosis: Secondary | ICD-10-CM | POA: Diagnosis not present

## 2019-04-23 DIAGNOSIS — Z23 Encounter for immunization: Secondary | ICD-10-CM | POA: Diagnosis not present

## 2019-04-30 ENCOUNTER — Inpatient Hospital Stay: Payer: Medicare Other | Attending: Oncology

## 2019-04-30 ENCOUNTER — Other Ambulatory Visit: Payer: Self-pay

## 2019-04-30 ENCOUNTER — Inpatient Hospital Stay: Payer: Medicare Other

## 2019-04-30 DIAGNOSIS — D751 Secondary polycythemia: Secondary | ICD-10-CM | POA: Diagnosis not present

## 2019-04-30 DIAGNOSIS — G4733 Obstructive sleep apnea (adult) (pediatric): Secondary | ICD-10-CM | POA: Insufficient documentation

## 2019-04-30 DIAGNOSIS — Z79899 Other long term (current) drug therapy: Secondary | ICD-10-CM | POA: Insufficient documentation

## 2019-04-30 DIAGNOSIS — Z85820 Personal history of malignant melanoma of skin: Secondary | ICD-10-CM | POA: Diagnosis not present

## 2019-04-30 LAB — HEMOGLOBIN: Hemoglobin: 16 g/dL — ABNORMAL HIGH (ref 12.0–15.0)

## 2019-04-30 LAB — HEMATOCRIT: HCT: 48.6 % — ABNORMAL HIGH (ref 36.0–46.0)

## 2019-05-01 DIAGNOSIS — M810 Age-related osteoporosis without current pathological fracture: Secondary | ICD-10-CM | POA: Diagnosis not present

## 2019-05-08 ENCOUNTER — Other Ambulatory Visit: Payer: Self-pay

## 2019-05-08 ENCOUNTER — Ambulatory Visit (INDEPENDENT_AMBULATORY_CARE_PROVIDER_SITE_OTHER): Payer: Medicare Other | Admitting: Nurse Practitioner

## 2019-05-08 ENCOUNTER — Encounter: Payer: Self-pay | Admitting: Nurse Practitioner

## 2019-05-08 VITALS — BP 138/70 | HR 79 | Temp 98.4°F | Resp 16 | Ht 62.0 in | Wt 97.0 lb

## 2019-05-08 DIAGNOSIS — Z0001 Encounter for general adult medical examination with abnormal findings: Secondary | ICD-10-CM

## 2019-05-08 DIAGNOSIS — Z1211 Encounter for screening for malignant neoplasm of colon: Secondary | ICD-10-CM

## 2019-05-08 DIAGNOSIS — R002 Palpitations: Secondary | ICD-10-CM

## 2019-05-08 DIAGNOSIS — I10 Essential (primary) hypertension: Secondary | ICD-10-CM | POA: Diagnosis not present

## 2019-05-08 DIAGNOSIS — R300 Dysuria: Secondary | ICD-10-CM

## 2019-05-08 NOTE — Progress Notes (Signed)
Reviewed with patient during visit. Unchanged from prior ECGs.

## 2019-05-08 NOTE — Progress Notes (Signed)
[facility] [place], [place] [address] s Internal MEDICINE  Office Visit Note  Patient Name: Sabrina Zamora  [id number]  [id number]  Date of Service: 05/26/2019   Pt is here for routine health maintenance examination   Chief Complaint  Patient presents with  . Annual Exam  . Hypertension     The patient is here for health maintenance exam. The patient states that she is feeling well. She is due to have routine, fasting labs drawn. She does have history of hypertension. Overall, well controlled.  Has also had history of recurrent urinary tract infections. Today, she reports no frequency, urgency, or dysuria.    Current Medication: Outpatient Encounter Medications as of 05/08/2019  Medication Sig Note  . amLODipine (NORVASC) 2.5 MG tablet Take 1 to 2 tab po daily for blood pressure   . aspirin EC 81 MG tablet Take 81 mg by mouth. 02/04/2017: Takes every other day.  . Cholecalciferol (VITAMIN D PO) Take by mouth daily.  08/25/2018: Unsure of dose   . conjugated estrogens (PREMARIN) vaginal cream Place 1 Applicatorful vaginally daily. Use pea sized amount M-W-Fr before bedtime   . Fluticasone-Salmeterol (ADVAIR) 250-50 MCG/DOSE AEPB Inhale 1 puff into the lungs 2 (two) times daily.   Marland Kitchen levothyroxine (SYNTHROID) 25 MCG tablet Take 1.5 tab po Daily before breakfast   . lovastatin (MEVACOR) 20 MG tablet Take 1 tablet (20 mg total) by mouth at bedtime.   . Multiple Vitamin (MULTI-VITAMINS) TABS Take by mouth. 05/06/2015: Received from: [facility]  . mupirocin nasal ointment (BACTROBAN) 2 % Place 1 application into the nose at bedtime.   Marland Kitchen omeprazole (PRILOSEC) 20 MG capsule Take 20 mg by mouth every other day.  05/06/2015: Received from: [facility]  . Probiotic Product (PROBIOTIC DAILY PO) Take by mouth daily.   . Zinc 25 MG TABS Take by mouth daily.    No facility-administered encounter medications on file as of 05/08/2019.     Surgical  History: Past Surgical History:  Procedure Laterality Date  . BREAST BIOPSY Left 2003   neg  . BREAST BIOPSY Right 09/27/2017   12:00 1 cmfn fibroadenomatous change   . BREAST BIOPSY Right 09/17/2017   12:00 3 cmfn benign breast and fibroadipose tissue  . BREAST EXCISIONAL BIOPSY Left 1988   excisional - neg  . CATARACT EXTRACTION W/PHACO Left 09/20/2017   Procedure: CATARACT EXTRACTION PHACO AND INTRAOCULAR LENS PLACEMENT ([place]) LEFT;  Surgeon: Leandrew Koyanagi, MD;  Location: [place];  Service: Ophthalmology;  Laterality: Left;  . CATARACT EXTRACTION W/PHACO Right 02/15/2018   Procedure: CATARACT EXTRACTION PHACO AND INTRAOCULAR LENS PLACEMENT ([place]) TOPICAL  RIGHT;  Surgeon: Leandrew Koyanagi, MD;  Location: [place];  Service: Ophthalmology;  Laterality: Right;  prefers early  . CHOLECYSTECTOMY      Medical History: Past Medical History:  Diagnosis Date  . Asthma   . Hypothyroidism   . Osteoporosis   . Skin cancer 2012, 2016    Family History: Family History  Problem Relation Age of Onset  . Clotting disorder Sister   . Breast cancer Sister 26      Review of Systems  Constitutional: Negative for chills, diaphoresis, fatigue and unexpected weight change.  HENT: Negative for ear pain, postnasal drip, sinus pressure and sore throat.   Respiratory: Negative for cough, shortness of breath and wheezing.   Cardiovascular: Negative for chest pain, palpitations and leg swelling.       Elevated blood pressure  today. Recheck of blood pressure indicates levels within normal limits.   Gastrointestinal: Negative for abdominal pain, constipation, diarrhea, nausea and vomiting.  Endocrine: Negative for cold intolerance, heat intolerance, polydipsia and polyuria.       Due to have check of thyroid panel.  Genitourinary: Negative for dysuria, frequency and urgency.  Musculoskeletal: Negative for arthralgias, back pain, gait problem and neck pain.  Skin:  Negative for color change and rash.  Allergic/Immunologic: Negative for environmental allergies and food allergies.  Neurological: Negative for dizziness, syncope and headaches.  Hematological: Does not bruise/bleed easily.  Psychiatric/Behavioral: Negative for agitation, behavioral problems (depression) and hallucinations. The patient is not nervous/anxious.      Today's Vitals   05/08/19 1416  BP: 138/70  Pulse: 79  Resp: 16  Temp: 98.4 F (36.9 C)  SpO2: 97%  Weight: 97 lb (44 kg)  Height: 5\' 2"  (1.575 m)   Body mass index is 17.74 kg/m.  Physical Exam Vitals signs and nursing note reviewed.  Constitutional:      General: She is not in acute distress.    Appearance: Normal appearance. She is well-developed. She is not diaphoretic.  HENT:     Head: Normocephalic and atraumatic.     Nose: Nose normal.     Mouth/Throat:     Pharynx: No oropharyngeal exudate.  Eyes:     Extraocular Movements: Extraocular movements intact.     Pupils: Pupils are equal, round, and reactive to light.  Neck:     Musculoskeletal: Normal range of motion and neck supple.     Thyroid: No thyromegaly.     Vascular: No carotid bruit or JVD.     Trachea: No tracheal deviation.  Cardiovascular:     Rate and Rhythm: Normal rate and regular rhythm.     Pulses: Normal pulses.     Heart sounds: Normal heart sounds. No murmur. No friction rub. No gallop.   Pulmonary:     Effort: Pulmonary effort is normal. No respiratory distress.     Breath sounds: Normal breath sounds. No wheezing or rales.  Chest:     Chest wall: No tenderness.  Abdominal:     General: Bowel sounds are normal.     Palpations: Abdomen is soft.     Tenderness: There is no abdominal tenderness.  Musculoskeletal: Normal range of motion.  Lymphadenopathy:     Cervical: No cervical adenopathy.  Skin:    General: Skin is warm and dry.  Neurological:     General: No focal deficit present.     Mental Status: She is alert and  oriented to person, place, and time.     Cranial Nerves: No cranial nerve deficit.  Psychiatric:        Behavior: Behavior normal.        Thought Content: Thought content normal.        Judgment: Judgment normal.    Depression screen [facility] 2/9 05/08/2019 12/28/2018 05/04/2018 01/06/2018 10/31/2017  Decreased Interest 0 0 0 0 0  Down, Depressed, Hopeless 0 0 0 0 0  PHQ - 2 Score 0 0 0 0 0    Functional Status Survey: Is the patient deaf or have difficulty hearing?: No Does the patient have difficulty seeing, even when wearing glasses/contacts?: No Does the patient have difficulty concentrating, remembering, or making decisions?: No Does the patient have difficulty walking or climbing stairs?: No Does the patient have difficulty dressing or bathing?: No Does the patient have difficulty doing errands alone such as visiting  a doctor's office or shopping?: No  MMSE - Mini Mental State Exam 05/08/2019 05/04/2018  Orientation to time 5 5  Orientation to Place 5 5  Registration 3 3  Attention/ Calculation 5 5  Recall 3 3  Language- name 2 objects 2 2  Language- repeat 1 1  Language- follow 3 step command 3 3  Language- read & follow direction 1 1  Write a sentence 1 1  Copy design 1 1  Total score 30 30    Fall Risk  05/08/2019 12/28/2018 05/04/2018 01/06/2018 10/31/2017  Falls in the past year? 0 0 No No No  Comment - - - - -      LABS: Recent Results (from the past 2160 hour(s))  Hemoglobin     Status: Abnormal   Collection Time: 04/30/19  1:56 PM  Result Value Ref Range   Hemoglobin 16.0 (H) 12.0 - 15.0 g/dL    Comment: Performed at [facility], [address] [place], [place] [address]  Hematocrit     Status: Abnormal   Collection Time: 04/30/19  1:56 PM  Result Value Ref Range   HCT 48.6 (H) 36.0 - 46.0 %    Comment: Performed at [facility], [address] [place],  [address]  UA/M w/rflx Culture, Routine     Status: Abnormal   Collection Time:  05/08/19  2:33 PM   Specimen: Urine   URINE  Result Value Ref Range   Specific Gravity, UA 1.008 1.005 - 1.030   pH, UA 7.0 5.0 - 7.5   Color, UA Yellow Yellow   Appearance Ur Clear Clear   Leukocytes,UA Trace (A) Negative   Protein,UA Negative Negative/Trace   Glucose, UA Negative Negative   Ketones, UA Negative Negative   RBC, UA Negative Negative   Bilirubin, UA Negative Negative   Urobilinogen, Ur 0.2 0.2 - 1.0 mg/dL   Nitrite, UA Negative Negative   Microscopic Examination See below:     Comment: Microscopic was indicated and was performed.   Urinalysis Reflex Comment     Comment: This specimen has reflexed to a Urine Culture.  Microscopic Examination     Status: None   Collection Time: 05/08/19  2:33 PM   URINE  Result Value Ref Range   WBC, UA 0-5 0 - 5 /hpf   RBC 0-2 0 - 2 /hpf   Epithelial Cells (non renal) 0-10 0 - 10 /hpf   Casts None seen None seen /lpf   Mucus, UA Present Not Estab.   Bacteria, UA Few None seen/Few  Urine Culture, Reflex     Status: Abnormal   Collection Time: 05/08/19  2:33 PM   URINE  Result Value Ref Range   Urine Culture, Routine Final report (A)    Organism ID, Bacteria Proteus mirabilis (A)     Comment: Greater than 100,000 colony forming units per mL Cefazolin <=4 ug/mL Cefazolin with an MIC <=16 predicts susceptibility to the oral agents cefaclor, cefdinir, cefpodoxime, cefprozil, cefuroxime, cephalexin, and loracarbef when used for therapy of uncomplicated urinary tract infections due to E. coli, Klebsiella pneumoniae, and Proteus mirabilis.    ORGANISM ID, BACTERIA Comment (A)     Comment: Escherichia coli, identified by an automated biochemical system. Greater than 100,000 colony forming units per mL Cefazolin <=4 ug/mL Cefazolin with an MIC <=16 predicts susceptibility to the oral agents cefaclor, cefdinir, cefpodoxime, cefprozil, cefuroxime, cephalexin, and loracarbef when used for therapy of uncomplicated urinary  tract infections due to E. coli,  Klebsiella pneumoniae, and Proteus mirabilis.    Antimicrobial Susceptibility Comment     Comment:       ** S = Susceptible; I = Intermediate; R = Resistant **                    P = Positive; N = Negative             MICS are expressed in micrograms per mL    Antibiotic                 RSLT#1    RSLT#2    RSLT#3    RSLT#4 Amoxicillin/Clavulanic Acid    S         S Ampicillin                     S         S Cefepime                       S         S Ceftriaxone                    S         S Cefuroxime                     S         S Ciprofloxacin                  S         S Ertapenem                      S         S Gentamicin                     S         S Imipenem                                 S Levofloxacin                   S         S Meropenem                      S         S Nitrofurantoin                 R         S Piperacillin/Tazobactam        S         S Tetracycline                   R         S Tobramycin                     S         S Trimethoprim/Sulfa             S         S   Comprehensive metabolic panel     Status: Abnormal   Collection Time: 05/09/19 10:50 AM  Result Value Ref Range   Glucose 92 65 - 99 mg/dL   BUN 15 8 -  27 mg/dL   Creatinine, Ser 0.83 0.57 - 1.00 mg/dL   GFR calc non Af Amer 64 >59 mL/min/1.73   GFR calc Af Amer 74 >59 mL/min/1.73   BUN/Creatinine Ratio 18 12 - 28   Sodium 141 134 - 144 mmol/L   Potassium 5.4 (H) 3.5 - 5.2 mmol/L   Chloride 100 96 - 106 mmol/L   CO2 27 20 - 29 mmol/L   Calcium 10.5 (H) 8.7 - 10.3 mg/dL   Total Protein 7.6 6.0 - 8.5 g/dL   Albumin 4.5 3.6 - 4.6 g/dL   Globulin, Total 3.1 1.5 - 4.5 g/dL   Albumin/Globulin Ratio 1.5 1.2 - 2.2   Bilirubin Total 0.5 0.0 - 1.2 mg/dL   Alkaline Phosphatase 70 39 - 117 IU/L   AST 36 0 - 40 IU/L   ALT 31 0 - 32 IU/L  CBC     Status: Abnormal   Collection Time: 05/09/19 10:50 AM  Result Value Ref Range   WBC 7.1 3.4 - 10.8 x10E3/uL    RBC 5.51 (H) 3.77 - 5.28 x10E6/uL   Hemoglobin 17.3 (H) 11.1 - 15.9 g/dL   Hematocrit 51.3 (H) 34.0 - 46.6 %   MCV 93 79 - 97 fL   MCH 31.4 26.6 - 33.0 pg   MCHC 33.7 31.5 - 35.7 g/dL   RDW 13.4 11.7 - 15.4 %   Platelets 241 150 - 450 x10E3/uL  Lipid Panel w/o Chol/HDL Ratio     Status: None   Collection Time: 05/09/19 10:50 AM  Result Value Ref Range   Cholesterol, Total 191 100 - 199 mg/dL   Triglycerides 76 0 - 149 mg/dL   HDL 80 >39 mg/dL   VLDL Cholesterol Cal 14 5 - 40 mg/dL   LDL Chol Calc (NIH) 97 0 - 99 mg/dL  T4, free     Status: None   Collection Time: 05/09/19 10:50 AM  Result Value Ref Range   Free T4 1.64 0.82 - 1.77 ng/dL  TSH     Status: None   Collection Time: 05/09/19 10:50 AM  Result Value Ref Range   TSH 2.570 0.450 - 4.500 uIU/mL  VITAMIN D 25 Hydroxy (Vit-D Deficiency, Fractures)     Status: None   Collection Time: 05/09/19 10:50 AM  Result Value Ref Range   Vit D, 25-Hydroxy 88.0 30.0 - 100.0 ng/mL    Comment: Vitamin D deficiency has been defined by the Institute of Medicine and an Endocrine Society practice guideline as a level of serum 25-OH vitamin D less than 20 ng/mL (1,2). The Endocrine Society went on to further define vitamin D insufficiency as a level between 21 and 29 ng/mL (2). 1. IOM (Institute of Medicine). 2010. Dietary reference    intakes for calcium and D. [place]: The    [employer]. 2. Holick MF, Binkley [place], Bischoff-Ferrari HA, et al.    Evaluation, treatment, and prevention of vitamin D    deficiency: an Endocrine Society clinical practice    guideline. JCEM. 2011 Jul; 96(7):1911-30.     Assessment/Plan: 1. Encounter for general adult medical examination with abnormal findings Annual health maintenance exam today. Will send for routine fasting labs.  2. Essential hypertension, benign Stable. Continue bp medication as prescribed   3. Palpitations - EKG 12-Lead is unchanged from prior studies. Shows  possible left atrial enlargement and possible pulmonary disease. Continue BP medication as prescribed and regular visits with pulmonolgy as scheduled   4. Screening for colon cancer Refer  to GI for screening colonoscopy.  - Ambulatory referral to Gastroenterology  5. Dysuria - UA/M w/rflx Culture, Routine  General Counseling: Anda verbalizes understanding of the findings of todays visit and agrees with plan of treatment. I have discussed any further diagnostic evaluation that may be needed or ordered today. We also reviewed her medications today. she has been encouraged to call the office with any questions or concerns that should arise related to todays visit.    Counseling:  Hypertension Counseling:   The following hypertensive lifestyle modification were recommended and discussed:  1. Limiting alcohol intake to less than 1 oz/day of ethanol:(24 oz of beer or 8 oz of wine or 2 oz of 100-proof whiskey). 2. Take baby ASA 81 mg daily. 3. Importance of regular aerobic exercise and losing weight. 4. Reduce dietary saturated fat and cholesterol intake for overall cardiovascular health. 5. Maintaining adequate dietary potassium, calcium, and magnesium intake. 6. Regular monitoring of the blood pressure. 7. Reduce sodium intake to less than 100 mmol/day (less than 2.3 gm of sodium or less than 6 gm of sodium choride)   This patient was seen by [place] with Dr Lavera Guise as a part of collaborative care agreement  Orders Placed This Encounter  Procedures  . Microscopic Examination  . Urine Culture, Reflex  . UA/M w/rflx Culture, Routine  . Ambulatory referral to Gastroenterology  . EKG 12-Lead      Time spent: [place], MD  Internal Medicine

## 2019-05-09 ENCOUNTER — Other Ambulatory Visit: Payer: Self-pay | Admitting: Nurse Practitioner

## 2019-05-09 DIAGNOSIS — Z0001 Encounter for general adult medical examination with abnormal findings: Secondary | ICD-10-CM | POA: Diagnosis not present

## 2019-05-09 DIAGNOSIS — I10 Essential (primary) hypertension: Secondary | ICD-10-CM | POA: Diagnosis not present

## 2019-05-09 DIAGNOSIS — E559 Vitamin D deficiency, unspecified: Secondary | ICD-10-CM | POA: Diagnosis not present

## 2019-05-09 DIAGNOSIS — E039 Hypothyroidism, unspecified: Secondary | ICD-10-CM | POA: Diagnosis not present

## 2019-05-09 DIAGNOSIS — E782 Mixed hyperlipidemia: Secondary | ICD-10-CM | POA: Diagnosis not present

## 2019-05-09 NOTE — Progress Notes (Signed)
Will wait for culture results to treat for UTI.

## 2019-05-10 LAB — LIPID PANEL W/O CHOL/HDL RATIO
Cholesterol, Total: 191 mg/dL (ref 100–199)
HDL: 80 mg/dL (ref >39)
LDL Chol Calc (NIH): 97 mg/dL (ref 0–99)
Triglycerides: 76 mg/dL (ref 0–149)
VLDL Cholesterol Cal: 14 mg/dL (ref 5–40)

## 2019-05-10 LAB — COMPREHENSIVE METABOLIC PANEL
ALT: 31 IU/L (ref 0–32)
AST: 36 IU/L (ref 0–40)
Albumin/Globulin Ratio: 1.5 (ref 1.2–2.2)
Albumin: 4.5 g/dL (ref 3.6–4.6)
Alkaline Phosphatase: 70 IU/L (ref 39–117)
BUN/Creatinine Ratio: 18 (ref 12–28)
BUN: 15 mg/dL (ref 8–27)
Bilirubin Total: 0.5 mg/dL (ref 0.0–1.2)
CO2: 27 mmol/L (ref 20–29)
Calcium: 10.5 mg/dL — ABNORMAL HIGH (ref 8.7–10.3)
Chloride: 100 mmol/L (ref 96–106)
Creatinine, Ser: 0.83 mg/dL (ref 0.57–1.00)
GFR calc Af Amer: 74 mL/min/{1.73_m2} (ref >59)
GFR calc non Af Amer: 64 mL/min/{1.73_m2} (ref >59)
Globulin, Total: 3.1 g/dL (ref 1.5–4.5)
Glucose: 92 mg/dL (ref 65–99)
Potassium: 5.4 mmol/L — ABNORMAL HIGH (ref 3.5–5.2)
Sodium: 141 mmol/L (ref 134–144)
Total Protein: 7.6 g/dL (ref 6.0–8.5)

## 2019-05-10 LAB — CBC
Hematocrit: 51.3 % — ABNORMAL HIGH (ref 34.0–46.6)
Hemoglobin: 17.3 g/dL — ABNORMAL HIGH (ref 11.1–15.9)
MCH: 31.4 pg (ref 26.6–33.0)
MCHC: 33.7 g/dL (ref 31.5–35.7)
MCV: 93 fL (ref 79–97)
Platelets: 241 10*3/uL (ref 150–450)
RBC: 5.51 x10E6/uL — ABNORMAL HIGH (ref 3.77–5.28)
RDW: 13.4 % (ref 11.7–15.4)
WBC: 7.1 10*3/uL (ref 3.4–10.8)

## 2019-05-10 LAB — T4, FREE: Free T4: 1.64 ng/dL (ref 0.82–1.77)

## 2019-05-10 LAB — VITAMIN D 25 HYDROXY (VIT D DEFICIENCY, FRACTURES): Vit D, 25-Hydroxy: 88 ng/mL (ref 30.0–100.0)

## 2019-05-10 LAB — TSH: TSH: 2.57 u[IU]/mL (ref 0.450–4.500)

## 2019-05-13 ENCOUNTER — Other Ambulatory Visit: Payer: Self-pay | Admitting: Nurse Practitioner

## 2019-05-13 DIAGNOSIS — N390 Urinary tract infection, site not specified: Secondary | ICD-10-CM

## 2019-05-13 DIAGNOSIS — R319 Hematuria, unspecified: Secondary | ICD-10-CM

## 2019-05-13 LAB — UA/M W/RFLX CULTURE, ROUTINE
Bilirubin, UA: NEGATIVE
Glucose, UA: NEGATIVE
Ketones, UA: NEGATIVE
Nitrite, UA: NEGATIVE
Protein,UA: NEGATIVE
RBC, UA: NEGATIVE
Specific Gravity, UA: 1.008 (ref 1.005–1.030)
Urobilinogen, Ur: 0.2 mg/dL (ref 0.2–1.0)
pH, UA: 7 (ref 5.0–7.5)

## 2019-05-13 LAB — URINE CULTURE, REFLEX

## 2019-05-13 LAB — MICROSCOPIC EXAMINATION: Casts: NONE SEEN /lpf

## 2019-05-13 MED ORDER — CEFUROXIME AXETIL 500 MG PO TABS: 500.0000 mg | ORAL_TABLET | Freq: Two times a day (BID) | ORAL | 0 refills | Status: AC

## 2019-05-13 NOTE — Progress Notes (Signed)
Please let the patient know that uti present in urine sample from physical. Mild and uncomplicated. Treat with ceftin 500mg  twice daily for 7 days, which she has tolerated well in the past. [facility]. Thanks

## 2019-05-13 NOTE — Progress Notes (Unsigned)
uti present in urine sample from physical. Mild and uncomplicated. Treat with ceftin 500mg  twice daily for 7 days, which she has tolerated well in the past. [facility].

## 2019-05-14 ENCOUNTER — Telehealth: Payer: Self-pay

## 2019-05-14 NOTE — Telephone Encounter (Signed)
Per Nira Conn let the patient know that uti present in urine sample from physical. Mild and uncomplicated. Treat with ceftin 500mg  twice daily for 7 days, which she has tolerated well in the past. [facility].

## 2019-05-22 ENCOUNTER — Ambulatory Visit: Payer: Medicare Other | Admitting: Adult Health

## 2019-05-26 DIAGNOSIS — R002 Palpitations: Secondary | ICD-10-CM | POA: Insufficient documentation

## 2019-05-26 DIAGNOSIS — Z0001 Encounter for general adult medical examination with abnormal findings: Secondary | ICD-10-CM | POA: Insufficient documentation

## 2019-05-26 DIAGNOSIS — Z1211 Encounter for screening for malignant neoplasm of colon: Secondary | ICD-10-CM | POA: Insufficient documentation

## 2019-05-29 ENCOUNTER — Telehealth: Payer: Self-pay

## 2019-05-29 NOTE — Telephone Encounter (Signed)
Patient called requesting to speak with a nurse for her labwork results. I did advise patient they were seeing patients but I would get a message to them to give her a call when they were able.She also would like a copy mailed to her.

## 2019-05-30 ENCOUNTER — Telehealth: Payer: Self-pay | Admitting: *Deleted

## 2019-05-30 DIAGNOSIS — D751 Secondary polycythemia: Secondary | ICD-10-CM

## 2019-05-30 NOTE — Telephone Encounter (Signed)
Please let her know that blood count is showing relatively high hemoglobin and hematocrit.  She should see her hematologist soon. Also, her calcium and potassium levels were both a little more elevated than usual. I would like to send her lab slip to recheck these levels. All other labs were good. We can mail copy of all lab results with her new lab slip. She can have labs redrawn at any time. Thanks.

## 2019-05-30 NOTE — Telephone Encounter (Signed)
lab/ and possible phlebotomy. labs at pcp office was hgb 51.3.  hct 17.3.  I spoke with Dr. Tasia Catchings. She approves a lab/md/phlebotomy apt as soon as possible/next available. msg sent to scheduling team to arrange for these apts.

## 2019-05-30 NOTE — Telephone Encounter (Signed)
Pt advised for labs and also labs slip and copy of labs ready for pickup at front

## 2019-05-31 ENCOUNTER — Inpatient Hospital Stay: Payer: Medicare Other | Attending: Oncology

## 2019-05-31 ENCOUNTER — Encounter: Payer: Self-pay | Admitting: Oncology

## 2019-05-31 ENCOUNTER — Inpatient Hospital Stay (HOSPITAL_BASED_OUTPATIENT_CLINIC_OR_DEPARTMENT_OTHER): Payer: Medicare Other | Admitting: Oncology

## 2019-05-31 ENCOUNTER — Telehealth: Payer: Self-pay

## 2019-05-31 ENCOUNTER — Inpatient Hospital Stay: Payer: Medicare Other

## 2019-05-31 ENCOUNTER — Other Ambulatory Visit: Payer: Self-pay

## 2019-05-31 VITALS — BP 159/94 | HR 88 | Temp 97.7°F | Resp 16 | Wt 96.9 lb

## 2019-05-31 VITALS — BP 130/82 | HR 80 | Resp 18

## 2019-05-31 DIAGNOSIS — Z79899 Other long term (current) drug therapy: Secondary | ICD-10-CM | POA: Insufficient documentation

## 2019-05-31 DIAGNOSIS — G4733 Obstructive sleep apnea (adult) (pediatric): Secondary | ICD-10-CM | POA: Diagnosis not present

## 2019-05-31 DIAGNOSIS — D751 Secondary polycythemia: Secondary | ICD-10-CM | POA: Diagnosis not present

## 2019-05-31 LAB — CBC WITH DIFFERENTIAL/PLATELET
Abs Immature Granulocytes: 0.04 10*3/uL (ref 0.00–0.07)
Basophils Absolute: 0.1 10*3/uL (ref 0.0–0.1)
Basophils Relative: 1 %
Eosinophils Absolute: 0.1 10*3/uL (ref 0.0–0.5)
Eosinophils Relative: 1 %
HCT: 51 % — ABNORMAL HIGH (ref 36.0–46.0)
Hemoglobin: 16.6 g/dL — ABNORMAL HIGH (ref 12.0–15.0)
Immature Granulocytes: 1 %
Lymphocytes Relative: 28 %
Lymphs Abs: 2 10*3/uL (ref 0.7–4.0)
MCH: 31.1 pg (ref 26.0–34.0)
MCHC: 32.5 g/dL (ref 30.0–36.0)
MCV: 95.5 fL (ref 80.0–100.0)
Monocytes Absolute: 0.7 10*3/uL (ref 0.1–1.0)
Monocytes Relative: 10 %
Neutro Abs: 4.3 10*3/uL (ref 1.7–7.7)
Neutrophils Relative %: 59 %
Platelets: 232 10*3/uL (ref 150–400)
RBC: 5.34 MIL/uL — ABNORMAL HIGH (ref 3.87–5.11)
RDW: 13.7 % (ref 11.5–15.5)
WBC: 7.2 10*3/uL (ref 4.0–10.5)
nRBC: 0 % (ref 0.0–0.2)

## 2019-05-31 NOTE — Progress Notes (Signed)
[facility] day:  05/31/19  Chief Complaint: Sabrina Zamora is a 85 y.o. female with a history of secondary polycythemia presents for follow up .   HPI:  The patient was last seen in the medical oncology clinic on 02/04/2017.  At that time, she felt good.  Exam was unremarkable.  Hematocrit was 46.1 and hemoglobin 15.6.  She had mot required a phlebotomy since 04/2015.  Follow-up appointments were spread out.  CBC was to be checked every 6 months.   Patient previously followed up with Dr. Mike Gip.  Patient switched care to me on 11/29/2018. Extensive chart review was performed.  #Secondary polycythemia Patient has a history of sleep apnea she wears oxygen at night but no CPAP mask.  Never smoker. Work-up on 01/11/2012 reviewed and negative Jak 2 V617F mutation, exon 12 mutation. 08/25/2018 CALR negative, JAK 2 E12-15 negative, MPL negative.  Patient has been on phlebotomy program to keep hematocrit less than 48 and then later to keep it less than 50.  #History of melanoma x3.  T1 a melanoma in August 2012.    INTERVAL HISTORY Sabrina Zamora is a 85 y.o. female who has above history reviewed by me today presents for follow up visit for management of secondary erythrocytosis. Problems and complaints are listed below: Patient recently had blood work done at the primary care provider's office.  Hemoglobin was elevated to 17.3. Patient currently requires appointment to be moved earlier. She has no new complaints. Uses nighttime oxygen. Denies any shortness of breath, chest pain, abdominal pain or nausea vomiting .   Review of Systems  Constitutional: Negative for appetite change, chills, fatigue and fever.  HENT:   Negative for hearing loss and voice change.   Eyes: Negative for eye problems.  Respiratory: Negative for chest tightness and cough.   Cardiovascular: Negative for chest pain.  Gastrointestinal: Negative for abdominal distention,  abdominal pain and blood in stool.  Endocrine: Negative for hot flashes.  Genitourinary: Negative for difficulty urinating and frequency.   Musculoskeletal: Negative for arthralgias.  Skin: Negative for itching and rash.  Neurological: Negative for extremity weakness.  Hematological: Negative for adenopathy.  Psychiatric/Behavioral: Negative for confusion.    Past Medical History:  Diagnosis Date  . Asthma   . Hypothyroidism   . Osteoporosis   . Skin cancer 2012, 2016    Past Surgical History:  Procedure Laterality Date  . BREAST BIOPSY Left 2003   neg  . BREAST BIOPSY Right 09/27/2017   12:00 1 cmfn fibroadenomatous change   . BREAST BIOPSY Right 09/17/2017   12:00 3 cmfn benign breast and fibroadipose tissue  . BREAST EXCISIONAL BIOPSY Left 1988   excisional - neg  . CATARACT EXTRACTION W/PHACO Left 09/20/2017   Procedure: CATARACT EXTRACTION PHACO AND INTRAOCULAR LENS PLACEMENT ([place]) LEFT;  Surgeon: Leandrew Koyanagi, MD;  Location: [place];  Service: Ophthalmology;  Laterality: Left;  . CATARACT EXTRACTION W/PHACO Right 02/15/2018   Procedure: CATARACT EXTRACTION PHACO AND INTRAOCULAR LENS PLACEMENT ([place]) TOPICAL  RIGHT;  Surgeon: Leandrew Koyanagi, MD;  Location: [place];  Service: Ophthalmology;  Laterality: Right;  prefers early  . CHOLECYSTECTOMY      Family History  Problem Relation Age of Onset  . Clotting disorder Sister   . Breast cancer Sister 49    Social History:  reports that she has never smoked. She has never used smokeless tobacco. She reports that she does not drink alcohol or use drugs.  She has never smoked.  The patient is alone today.  Allergies:  Allergies  Allergen Reactions  . Levofloxacin Rash and Shortness Of Breath  . Penicillins Hives and Rash  . Betaine Rash  . Ciprofloxacin Itching and Rash  . Sulfa Antibiotics Rash    Current Medications: Current Outpatient Medications  Medication Sig Dispense Refill   . amLODipine (NORVASC) 2.5 MG tablet Take 1 to 2 tab po daily for blood pressure 45 tablet 5  . aspirin EC 81 MG tablet Take 81 mg by mouth.    . Cholecalciferol (VITAMIN D PO) Take by mouth daily.     Marland Kitchen conjugated estrogens (PREMARIN) vaginal cream Place 1 Applicatorful vaginally daily. Use pea sized amount M-W-Fr before bedtime 42.5 g 3  . denosumab (PROLIA) 60 MG/ML SOSY injection Inject into the skin.    Marland Kitchen Fluticasone-Salmeterol (ADVAIR) 250-50 MCG/DOSE AEPB Inhale 1 puff into the lungs 2 (two) times daily. 60 each 3  . levothyroxine (SYNTHROID) 25 MCG tablet Take 1.5 tab po Daily before breakfast 135 tablet 0  . lovastatin (MEVACOR) 20 MG tablet Take 1 tablet (20 mg total) by mouth at bedtime. 90 tablet 2  . Multiple Vitamin (MULTI-VITAMINS) TABS Take by mouth.    . mupirocin nasal ointment (BACTROBAN) 2 % Place 1 application into the nose at bedtime. 10 g 1  . omeprazole (PRILOSEC) 20 MG capsule Take 20 mg by mouth every other day.     . Probiotic Product (PROBIOTIC DAILY PO) Take by mouth daily.    . Zinc 25 MG TABS Take by mouth daily.     No current facility-administered medications for this visit.     Physical Exam: Blood pressure (!) 159/94, pulse 88, temperature 97.7 F (36.5 C), temperature source Tympanic, resp. rate 16, weight 96 lb 14.4 oz (44 kg), SpO2 98 %.  Physical Exam  Constitutional: She is oriented to person, place, and time. No distress.  Thin built, walks independently.   HENT:  Head: Normocephalic and atraumatic.  Nose: Nose normal.  Mouth/Throat: Oropharynx is clear and moist. No oropharyngeal exudate.  Eyes: Pupils are equal, round, and reactive to light. EOM are normal. No scleral icterus.  Neck: Normal range of motion. Neck supple.  Cardiovascular: Normal rate and regular rhythm.  No murmur heard. Pulmonary/Chest: Effort normal. No respiratory distress. She has no rales. She exhibits no tenderness.  Abdominal: Soft. She exhibits no distension. There is  no abdominal tenderness.  Musculoskeletal: Normal range of motion.        General: No edema.  Neurological: She is alert and oriented to person, place, and time. No cranial nerve deficit. She exhibits normal muscle tone. Coordination normal.  Skin: Skin is warm and dry. She is not diaphoretic. No erythema.  Psychiatric: Affect normal.     Appointment on 05/31/2019  Component Date Value Ref Range Status  . WBC 05/31/2019 7.2  4.0 - 10.5 K/uL Final  . RBC 05/31/2019 5.34* 3.87 - 5.11 MIL/uL Final  . Hemoglobin 05/31/2019 16.6* 12.0 - 15.0 g/dL Final  . HCT 05/31/2019 51.0* 36.0 - 46.0 % Final  . MCV 05/31/2019 95.5  80.0 - 100.0 fL Final  . MCH 05/31/2019 31.1  26.0 - 34.0 pg Final  . MCHC 05/31/2019 32.5  30.0 - 36.0 g/dL Final  . RDW 05/31/2019 13.7  11.5 - 15.5 % Final  . Platelets 05/31/2019 232  150 - 400 K/uL Final  . nRBC 05/31/2019 0.0  0.0 - 0.2 % Final  . Neutrophils Relative %  05/31/2019 59  % Final  . Neutro Abs 05/31/2019 4.3  1.7 - 7.7 K/uL Final  . Lymphocytes Relative 05/31/2019 28  % Final  . Lymphs Abs 05/31/2019 2.0  0.7 - 4.0 K/uL Final  . Monocytes Relative 05/31/2019 10  % Final  . Monocytes Absolute 05/31/2019 0.7  0.1 - 1.0 K/uL Final  . Eosinophils Relative 05/31/2019 1  % Final  . Eosinophils Absolute 05/31/2019 0.1  0.0 - 0.5 K/uL Final  . Basophils Relative 05/31/2019 1  % Final  . Basophils Absolute 05/31/2019 0.1  0.0 - 0.1 K/uL Final  . Immature Granulocytes 05/31/2019 1  % Final  . Abs Immature Granulocytes 05/31/2019 0.04  0.00 - 0.07 K/uL Final   Performed at [facility], [address] [place], [place] [address]    Assessment:  Sabrina Zamora is a 85 y.o. female present for follow up of erythrocytosis.  1. Erythrocytosis   2. Obstructive sleep apnea   Labs are reviewed and discussed with patient. Secondary erythrocytosis due to OSA. Today's blood work showed hemoglobin 16.6, hematocrit 51. Recommend patient to proceed with  phlebotomy 200 cc x 1 Follow-up in 3 months with repeat labs and possible phlebotomy. OSA  advised patient to consider CPAP.  Follow-up wit primary care provider and pulmonology.  We spent sufficient time to discuss many aspect of care, questions were answered to patient's satisfaction. Total face to face encounter time for this patient visit was 15 min. >50% of the time was  spent in counseling and coordination of care.    Earlie Server, MD  05/31/2019, 8:52 PM

## 2019-05-31 NOTE — Telephone Encounter (Signed)
Pt walk in left note that she need appt with pulmonology as per dr Tasia Catchings lmom with husband pt need appt with adam when she call us back

## 2019-05-31 NOTE — Progress Notes (Signed)
Patient here for follow up. No concerns voiced.  °

## 2019-06-05 ENCOUNTER — Telehealth: Payer: Self-pay

## 2019-06-05 NOTE — Telephone Encounter (Signed)
Called lmom informing patient of appointment. klh 

## 2019-06-11 ENCOUNTER — Telehealth: Payer: Self-pay

## 2019-06-11 NOTE — Telephone Encounter (Signed)
Confirmed appointment with patient. klh °

## 2019-06-12 ENCOUNTER — Ambulatory Visit (INDEPENDENT_AMBULATORY_CARE_PROVIDER_SITE_OTHER): Payer: Medicare Other | Admitting: Internal Medicine

## 2019-06-12 VITALS — BP 136/88 | HR 77 | Temp 96.8°F | Resp 16 | Ht 60.0 in | Wt 100.0 lb

## 2019-06-12 DIAGNOSIS — G4733 Obstructive sleep apnea (adult) (pediatric): Secondary | ICD-10-CM

## 2019-06-12 DIAGNOSIS — J449 Chronic obstructive pulmonary disease, unspecified: Secondary | ICD-10-CM | POA: Diagnosis not present

## 2019-06-12 DIAGNOSIS — Z9981 Dependence on supplemental oxygen: Secondary | ICD-10-CM

## 2019-06-13 ENCOUNTER — Other Ambulatory Visit: Payer: Self-pay | Admitting: Internal Medicine

## 2019-06-13 ENCOUNTER — Encounter: Payer: Self-pay | Admitting: Internal Medicine

## 2019-06-13 ENCOUNTER — Telehealth: Payer: Self-pay

## 2019-06-13 DIAGNOSIS — G4733 Obstructive sleep apnea (adult) (pediatric): Secondary | ICD-10-CM

## 2019-06-13 NOTE — Telephone Encounter (Signed)
CONFIRMED sleep study schd for 06/15/2019. beth

## 2019-06-15 ENCOUNTER — Ambulatory Visit (INDEPENDENT_AMBULATORY_CARE_PROVIDER_SITE_OTHER): Payer: Medicare Other | Admitting: Internal Medicine

## 2019-06-15 DIAGNOSIS — G4733 Obstructive sleep apnea (adult) (pediatric): Secondary | ICD-10-CM

## 2019-06-21 ENCOUNTER — Ambulatory Visit: Payer: Medicare Other | Admitting: Adult Health

## 2019-06-24 NOTE — Procedures (Signed)
[facility] [address] [place], [place] [address]  Patient Name: Sabrina Zamora DOB: 03-Jul-1931   SLEEP STUDY INTERPRETATION  DATE OF SERVICE: June 15, 2019   SLEEP STUDY HISTORY: This patient is referred to the sleep lab for a baseline Polysomnography. Pertinent history includes a history of diagnosis of excessive daytime somnolence and snoring.  PROCEDURE: This overnight polysomnogram was performed using the Alice 5 acquisition system using the standard diagnostic protocol as outlined by the AASM. This includes 6 channels of EEG, 2 channelscannels of EOG, chin EMG, bilateral anterior tibialis EMG, nasal/oral thermister, PTAF, chest and abdominal wall movements, ECG and pulse oximetry. Apneas and Hypopneas were scored per AASM definition.  SLEEP ARCHITECHTURE: This is a baseline polysomnograph  study. The total recording time was 416.0 minutes and the patients total sleep time is noted to be 166.5 minutes. Sleep onset latency was 39.5 minutes and is prolonged.  Stage R sleep onset latency was 82.0 minutes. Sleep maintenance efficiency was 40.1% and is decreased.  Sleep staging expressed as a percentage of total sleep time demonstrated 12.9% N1, 54.1% N2 and 29.4% N3  sleep. Stage R represents 3.6% of total sleep time. This is decreased.  There were a total of 14 arousals  for an overall arousal index of 5.0 per hour of sleep. PLMS arousal were not noted. Arousals without respiratory events are  noted. This can contribute to sleep architechture disruption.  RESPIRATORY MONITORING:   Patient exhibits no significant evidence of sleep disorderd breathing characterized by 0 central apneas, 0 obstructive apneas and 0 mixed apneas. There were 1 obstructive hypopneas and 1 RERAs. Most of the apneas/hypopneas were of obstructive variety. The total apnea hypopnea index (apneas and hypopneas per hour of sleep) is 0.4 respiratory events per hour and is within normal  limits.  Respiratory monitoring demonstrated very mild snoring through the night. There are a total of 2 snoring episodes representing 0.1% of sleep.   Baseline oxygen saturation during wakefulness was 92% and during NREM sleep averaged 95% through the night. Arterial saturation during REM sleep was 92% through the night. There was no significant  oxygen desaturation with the respiratory events. Arterial oxygen desaturation occurred of at least 4% was noted with a low saturation of 91%. The study was performed off oxygen.  CARDIAC MONITORING:   Average heart rate is 62 during sleep with a high of 77 beats per minute. Malignant arrhythmias were not noted.  CPAP titration: Patient was started on CPAP titration according to the sleep lab protocol.  Patient was started on a CPAP of 4 increased up to a pressure of 6 cm water pressure.  She actually did well on pressure of 5 cm water pressure on this pressure/slept for 58.2 minutes did not exhibit stage R however did exhibit stage R on a pressure of 4 cm water pressure.  The total apnea-hypopnea index was 0 and the lowest saturation was 95% so it appears that a CPAP of 5 is adequate to control this patient's symptoms.  IMPRESSIONS:  --This overnight polysomnogram demonstrates no significant sleep apnea with an overall AHI 0.4 per hour. --The overall AHI was no worse  during Stage R. --There were associated no significant arterial oxygen desaturations noted with the lowest saturation of 91% --There was no significant PLMS noted in this study. --There is no significant snoring noted throughout the study.    RECOMMENDATIONS:  --CPAP titration study is adequate to control this patient's obstructive sleep apnea it appears the optimal pressure  is a CPAP of 5 cm water pressure at minimum.  Patient also did fine on a pressure of 6 cm water pressure it would be suggested to perhaps start on a 5 cm water pressure and titrated as necessary. --Nasal  decongestants and antihistamines may be of help for increased upper airways resistance when present. --Weight loss through dietary and lifestyle modification is recommended in the presence of obesity. --A search for and treatment of any underlying cardiopulmonary disease is      recommended in the presence of oxygen desaturations. --Alternative treatment options if the patient is not willing to use CPAP include oral   appliances as well as surgical intervention which may help in the appropriate patient. --Clinical correlation is recommended. Please feel free to call the office for any further  questions or assistance in the care of this patient.     Allyne Gee, MD [facility] Pulmonary Critical Care Medicine Sleep medicine

## 2019-06-26 ENCOUNTER — Telehealth: Payer: Self-pay

## 2019-06-26 NOTE — Telephone Encounter (Signed)
Confirmed appointment with patient. klh °

## 2019-06-28 ENCOUNTER — Encounter: Payer: Self-pay | Admitting: Internal Medicine

## 2019-06-28 ENCOUNTER — Other Ambulatory Visit: Payer: Self-pay

## 2019-06-28 ENCOUNTER — Encounter (INDEPENDENT_AMBULATORY_CARE_PROVIDER_SITE_OTHER): Payer: Self-pay

## 2019-06-28 ENCOUNTER — Telehealth: Payer: Self-pay

## 2019-06-28 ENCOUNTER — Ambulatory Visit (INDEPENDENT_AMBULATORY_CARE_PROVIDER_SITE_OTHER): Payer: Medicare Other | Admitting: Internal Medicine

## 2019-06-28 DIAGNOSIS — G4733 Obstructive sleep apnea (adult) (pediatric): Secondary | ICD-10-CM

## 2019-06-28 DIAGNOSIS — I10 Essential (primary) hypertension: Secondary | ICD-10-CM

## 2019-06-28 NOTE — Progress Notes (Signed)
[facility] [place], [place] [address]  Internal MEDICINE  Telephone Visit  Patient Name: Sabrina Zamora  [id number]  [id number]  Date of Service: 06/28/2019  I connected with the patient at 1145 by telephone and verified the patients identity using two identifiers.   I discussed the limitations, risks, security and privacy concerns of performing an evaluation and management service by telephone and the availability of in person appointments. I also discussed with the patient that there may be a patient responsible charge related to the service.  The patient expressed understanding and agrees to proceed.    Chief Complaint  Patient presents with  . Telephone Screen  . Telephone Assessment  . Follow-up    sleep study    HPI  Pt is here to follow up on cpap titration study.  During titration it appears that she has no significant sleep apnea with the pressure of 5cm/h20.  Will order patient a new machine, and follow up with her after delivery.     Current Medication: Outpatient Encounter Medications as of 06/28/2019  Medication Sig Note  . amLODipine (NORVASC) 2.5 MG tablet Take 1 to 2 tab po daily for blood pressure   . aspirin EC 81 MG tablet Take 81 mg by mouth. 02/04/2017: Takes every other day.  . Cholecalciferol (VITAMIN D PO) Take by mouth daily.  08/25/2018: Unsure of dose   . conjugated estrogens (PREMARIN) vaginal cream Place 1 Applicatorful vaginally daily. Use pea sized amount M-W-Fr before bedtime   . denosumab (PROLIA) 60 MG/ML SOSY injection Inject into the skin.   Marland Kitchen Fluticasone-Salmeterol (ADVAIR) 250-50 MCG/DOSE AEPB Inhale 1 puff into the lungs 2 (two) times daily.   Marland Kitchen levothyroxine (SYNTHROID) 25 MCG tablet Take 1.5 tab po Daily before breakfast   . lovastatin (MEVACOR) 20 MG tablet Take 1 tablet (20 mg total) by mouth at bedtime.   . Multiple Vitamin (MULTI-VITAMINS) TABS Take by mouth. 05/06/2015: Received from: [facility]  .  mupirocin nasal ointment (BACTROBAN) 2 % Place 1 application into the nose at bedtime.   Marland Kitchen omeprazole (PRILOSEC) 20 MG capsule Take 20 mg by mouth every other day.  05/06/2015: Received from: [facility]  . Probiotic Product (PROBIOTIC DAILY PO) Take by mouth daily.   . Zinc 25 MG TABS Take by mouth daily.    No facility-administered encounter medications on file as of 06/28/2019.    Surgical History: Past Surgical History:  Procedure Laterality Date  . BREAST BIOPSY Left 2003   neg  . BREAST BIOPSY Right 09/27/2017   12:00 1 cmfn fibroadenomatous change   . BREAST BIOPSY Right 09/17/2017   12:00 3 cmfn benign breast and fibroadipose tissue  . BREAST EXCISIONAL BIOPSY Left 1988   excisional - neg  . CATARACT EXTRACTION W/PHACO Left 09/20/2017   Procedure: CATARACT EXTRACTION PHACO AND INTRAOCULAR LENS PLACEMENT ([place]) LEFT;  Surgeon: Leandrew Koyanagi, MD;  Location: [place];  Service: Ophthalmology;  Laterality: Left;  . CATARACT EXTRACTION W/PHACO Right 02/15/2018   Procedure: CATARACT EXTRACTION PHACO AND INTRAOCULAR LENS PLACEMENT ([place]) TOPICAL  RIGHT;  Surgeon: Leandrew Koyanagi, MD;  Location: [place];  Service: Ophthalmology;  Laterality: Right;  prefers early  . CHOLECYSTECTOMY      Medical History: Past Medical History:  Diagnosis Date  . Asthma   . Hypothyroidism   . Osteoporosis   . Skin cancer 2012, 2016    Family History: Family History  Problem Relation Age of Onset  .  Clotting disorder Sister   . Breast cancer Sister 96    Social History   Socioeconomic History  . Marital status: Married    Spouse name: Not on file  . Number of children: Not on file  . Years of education: Not on file  . Highest education level: Not on file  Occupational History  . Not on file  Tobacco Use  . Smoking status: Never Smoker  . Smokeless tobacco: Never Used  Substance and Sexual Activity  . Alcohol use: Never    Alcohol/week: 0.0  standard drinks  . Drug use: Never  . Sexual activity: Not on file  Other Topics Concern  . Not on file  Social History Narrative  . Not on file   Social Determinants of Health   Financial Resource Strain:   . Difficulty of Paying Living Expenses: Not on file  Food Insecurity:   . Worried About Charity fundraiser in the Last Year: Not on file  . Ran Out of Food in the Last Year: Not on file  Transportation Needs:   . Lack of Transportation (Medical): Not on file  . Lack of Transportation (Non-Medical): Not on file  Physical Activity:   . Days of Exercise per Week: Not on file  . Minutes of Exercise per Session: Not on file  Stress:   . Feeling of Stress : Not on file  Social Connections:   . Frequency of Communication with Friends and Family: Not on file  . Frequency of Social Gatherings with Friends and Family: Not on file  . Attends Religious Services: Not on file  . Active Member of Clubs or Organizations: Not on file  . Attends Archivist Meetings: Not on file  . Marital Status: Not on file  Intimate Partner Violence:   . Fear of Current or Ex-Partner: Not on file  . Emotionally Abused: Not on file  . Physically Abused: Not on file  . Sexually Abused: Not on file      Review of Systems  Constitutional: Negative for chills, fatigue and unexpected weight change.  HENT: Negative for congestion, rhinorrhea, sneezing and sore throat.   Eyes: Negative for photophobia, pain and redness.  Respiratory: Negative for cough, chest tightness and shortness of breath.   Cardiovascular: Negative for chest pain and palpitations.  Gastrointestinal: Negative for abdominal pain, constipation, diarrhea, nausea and vomiting.  Endocrine: Negative.   Genitourinary: Negative for dysuria and frequency.  Musculoskeletal: Negative for arthralgias, back pain, joint swelling and neck pain.  Skin: Negative for rash.  Allergic/Immunologic: Negative.   Neurological: Negative for  tremors and numbness.  Hematological: Negative for adenopathy. Does not bruise/bleed easily.  Psychiatric/Behavioral: Negative for behavioral problems and sleep disturbance. The patient is not nervous/anxious.     Vital Signs: There were no vitals taken for this visit.   Observation/Objective:  Well sounding, NAD noted.  Denies complaints.   Assessment/Plan: 1. OSA (obstructive sleep apnea) Will follow up with patient after therapy begins with new machine.  - For home use only DME continuous positive airway pressure (CPAP)  2. Essential hypertension, benign Controlled, continue present management.   General Counseling: Graylyn verbalizes understanding of the findings of today's phone visit and agrees with plan of treatment. I have discussed any further diagnostic evaluation that may be needed or ordered today. We also reviewed her medications today. she has been encouraged to call the office with any questions or concerns that should arise related to todays visit.    Orders  Placed This Encounter  Procedures  . For home use only DME continuous positive airway pressure (CPAP)    No orders of the defined types were placed in this encounter.   Time spent: [place] AGNP-C Internal medicine

## 2019-06-28 NOTE — Telephone Encounter (Signed)
Gave [facility] new cpap set up orders for in clinic. Sabrina Zamora

## 2019-06-29 ENCOUNTER — Other Ambulatory Visit: Payer: Medicare Other

## 2019-06-29 ENCOUNTER — Ambulatory Visit: Payer: Medicare Other | Admitting: Oncology

## 2019-07-02 NOTE — Progress Notes (Signed)
[facility] [place], [place] [address]  Pulmonary Sleep Medicine   Office Visit Note  Patient Name: Sabrina Zamora DOB: May 03, 1931 MRN [id number]  Date of Service: 06/12/2019  Complaints/HPI: Patient is here for follow-up after being absent from the office for quite some time.  She has a past medical history for hypertension obstructive sleep apnea hypothyroidism osteoporosis anxiety disorder among other problems who had a diagnosis of sleep apnea in the past and apparently was supposed to be on CPAP device.  She had a repeat sleep study done in July which revealed that she had an apnea-hypopnea index of 7.8 per hour and a respiratory disturbance index of 11.7/h with a total of 44 obstructive apneas.  She also had significant oxygen desaturation.  She now is here for evaluation of possibility of using CPAP.  We had a rather lengthy discussion about this and she does appear to have some symptoms of hypersomnia and with her prior history of cardiac issues it would be unreasonable to pursue doing a CPAP titration  ROS  General: (-) fever, (-) chills, (-) night sweats, (-) weakness Skin: (-) rashes, (-) itching,. Eyes: (-) visual changes, (-) redness, (-) itching. Nose and Sinuses: (-) nasal stuffiness or itchiness, (-) postnasal drip, (-) nosebleeds, (-) sinus trouble. Mouth and Throat: (-) sore throat, (-) hoarseness. Neck: (-) swollen glands, (-) enlarged thyroid, (-) neck pain. Respiratory: - cough, (-) bloody sputum, - shortness of breath, - wheezing. Cardiovascular: - ankle swelling, (-) chest pain. Lymphatic: (-) lymph node enlargement. Neurologic: (-) numbness, (-) tingling. Psychiatric: (-) anxiety, (-) depression   Current Medication: Outpatient Encounter Medications as of 06/12/2019  Medication Sig Note  . amLODipine (NORVASC) 2.5 MG tablet Take 1 to 2 tab po daily for blood pressure   . aspirin EC 81 MG tablet Take 81 mg by mouth. 02/04/2017: Takes  every other day.  . Cholecalciferol (VITAMIN D PO) Take by mouth daily.  08/25/2018: Unsure of dose   . conjugated estrogens (PREMARIN) vaginal cream Place 1 Applicatorful vaginally daily. Use pea sized amount M-W-Fr before bedtime   . denosumab (PROLIA) 60 MG/ML SOSY injection Inject into the skin.   Marland Kitchen Fluticasone-Salmeterol (ADVAIR) 250-50 MCG/DOSE AEPB Inhale 1 puff into the lungs 2 (two) times daily.   Marland Kitchen levothyroxine (SYNTHROID) 25 MCG tablet Take 1.5 tab po Daily before breakfast   . lovastatin (MEVACOR) 20 MG tablet Take 1 tablet (20 mg total) by mouth at bedtime.   . Multiple Vitamin (MULTI-VITAMINS) TABS Take by mouth. 05/06/2015: Received from: [facility]  . mupirocin nasal ointment (BACTROBAN) 2 % Place 1 application into the nose at bedtime.   Marland Kitchen omeprazole (PRILOSEC) 20 MG capsule Take 20 mg by mouth every other day.  05/06/2015: Received from: [facility]  . Probiotic Product (PROBIOTIC DAILY PO) Take by mouth daily.   . Zinc 25 MG TABS Take by mouth daily.    No facility-administered encounter medications on file as of 06/12/2019.    Surgical History: Past Surgical History:  Procedure Laterality Date  . BREAST BIOPSY Left 2003   neg  . BREAST BIOPSY Right 09/27/2017   12:00 1 cmfn fibroadenomatous change   . BREAST BIOPSY Right 09/17/2017   12:00 3 cmfn benign breast and fibroadipose tissue  . BREAST EXCISIONAL BIOPSY Left 1988   excisional - neg  . CATARACT EXTRACTION W/PHACO Left 09/20/2017   Procedure: CATARACT EXTRACTION PHACO AND INTRAOCULAR LENS PLACEMENT ([place]) LEFT;  Surgeon: Leandrew Koyanagi, MD;  Location: [place];  Service: Ophthalmology;  Laterality: Left;  . CATARACT EXTRACTION W/PHACO Right 02/15/2018   Procedure: CATARACT EXTRACTION PHACO AND INTRAOCULAR LENS PLACEMENT ([place]) TOPICAL  RIGHT;  Surgeon: Leandrew Koyanagi, MD;  Location: [place];  Service: Ophthalmology;  Laterality: Right;  prefers early  . CHOLECYSTECTOMY       Medical History: Past Medical History:  Diagnosis Date  . Asthma   . Hypothyroidism   . Osteoporosis   . Skin cancer 2012, 2016    Family History: Family History  Problem Relation Age of Onset  . Clotting disorder Sister   . Breast cancer Sister 18    Social History: Social History   Socioeconomic History  . Marital status: Married    Spouse name: Not on file  . Number of children: Not on file  . Years of education: Not on file  . Highest education level: Not on file  Occupational History  . Not on file  Tobacco Use  . Smoking status: Never Smoker  . Smokeless tobacco: Never Used  Substance and Sexual Activity  . Alcohol use: Never    Alcohol/week: 0.0 standard drinks  . Drug use: Never  . Sexual activity: Not on file  Other Topics Concern  . Not on file  Social History Narrative  . Not on file   Social Determinants of Health   Financial Resource Strain:   . Difficulty of Paying Living Expenses: Not on file  Food Insecurity:   . Worried About Charity fundraiser in the Last Year: Not on file  . Ran Out of Food in the Last Year: Not on file  Transportation Needs:   . Lack of Transportation (Medical): Not on file  . Lack of Transportation (Non-Medical): Not on file  Physical Activity:   . Days of Exercise per Week: Not on file  . Minutes of Exercise per Session: Not on file  Stress:   . Feeling of Stress : Not on file  Social Connections:   . Frequency of Communication with Friends and Family: Not on file  . Frequency of Social Gatherings with Friends and Family: Not on file  . Attends Religious Services: Not on file  . Active Member of Clubs or Organizations: Not on file  . Attends Archivist Meetings: Not on file  . Marital Status: Not on file  Intimate Partner Violence:   . Fear of Current or Ex-Partner: Not on file  . Emotionally Abused: Not on file  . Physically Abused: Not on file  . Sexually Abused: Not on file    Vital  Signs: Blood pressure 136/88, pulse 77, temperature (!) 96.8 F (36 C), resp. rate 16, height 5' (1.524 m), weight 100 lb (45.4 kg), SpO2 97 %.  Examination: General Appearance: The patient is well-developed, well-nourished, and in no distress. Skin: Gross inspection of skin unremarkable. Head: normocephalic, no gross deformities. Eyes: no gross deformities noted. ENT: ears appear grossly normal no exudates. Neck: Supple. No thyromegaly. No LAD. Respiratory: No rhonchi no rales are noted at this time. Cardiovascular: Normal S1 and S2 without murmur or rub. Extremities: No cyanosis. pulses are equal. Neurologic: Alert and oriented. No involuntary movements.  LABS: Recent Results (from the past 2160 hour(s))  Hemoglobin     Status: Abnormal   Collection Time: 04/30/19  1:56 PM  Result Value Ref Range   Hemoglobin 16.0 (H) 12.0 - 15.0 g/dL    Comment: Performed at [facility], [address] [place],  [place] [address]  Hematocrit     Status: Abnormal   Collection Time: 04/30/19  1:56 PM  Result Value Ref Range   HCT 48.6 (H) 36.0 - 46.0 %    Comment: Performed at [facility], [address] [place], [place] [address]  UA/M w/rflx Culture, Routine     Status: Abnormal   Collection Time: 05/08/19  2:33 PM   Specimen: Urine   URINE  Result Value Ref Range   Specific Gravity, UA 1.008 1.005 - 1.030   pH, UA 7.0 5.0 - 7.5   Color, UA Yellow Yellow   Appearance Ur Clear Clear   Leukocytes,UA Trace (A) Negative   Protein,UA Negative Negative/Trace   Glucose, UA Negative Negative   Ketones, UA Negative Negative   RBC, UA Negative Negative   Bilirubin, UA Negative Negative   Urobilinogen, Ur 0.2 0.2 - 1.0 mg/dL   Nitrite, UA Negative Negative   Microscopic Examination See below:     Comment: Microscopic was indicated and was performed.   Urinalysis Reflex Comment     Comment: This specimen has reflexed to a Urine Culture.  Microscopic Examination     Status:  None   Collection Time: 05/08/19  2:33 PM   URINE  Result Value Ref Range   WBC, UA 0-5 0 - 5 /hpf   RBC 0-2 0 - 2 /hpf   Epithelial Cells (non renal) 0-10 0 - 10 /hpf   Casts None seen None seen /lpf   Mucus, UA Present Not Estab.   Bacteria, UA Few None seen/Few  Urine Culture, Reflex     Status: Abnormal   Collection Time: 05/08/19  2:33 PM   URINE  Result Value Ref Range   Urine Culture, Routine Final report (A)    Organism ID, Bacteria Proteus mirabilis (A)     Comment: Greater than 100,000 colony forming units per mL Cefazolin <=4 ug/mL Cefazolin with an MIC <=16 predicts susceptibility to the oral agents cefaclor, cefdinir, cefpodoxime, cefprozil, cefuroxime, cephalexin, and loracarbef when used for therapy of uncomplicated urinary tract infections due to E. coli, Klebsiella pneumoniae, and Proteus mirabilis.    ORGANISM ID, BACTERIA Comment (A)     Comment: Escherichia coli, identified by an automated biochemical system. Greater than 100,000 colony forming units per mL Cefazolin <=4 ug/mL Cefazolin with an MIC <=16 predicts susceptibility to the oral agents cefaclor, cefdinir, cefpodoxime, cefprozil, cefuroxime, cephalexin, and loracarbef when used for therapy of uncomplicated urinary tract infections due to E. coli, Klebsiella pneumoniae, and Proteus mirabilis.    Antimicrobial Susceptibility Comment     Comment:       ** S = Susceptible; I = Intermediate; R = Resistant **                    P = Positive; N = Negative             MICS are expressed in micrograms per mL    Antibiotic                 RSLT#1    RSLT#2    RSLT#3    RSLT#4 Amoxicillin/Clavulanic Acid    S         S Ampicillin                     S         S Cefepime  S         S Ceftriaxone                    S         S Cefuroxime                     S         S Ciprofloxacin                  S         S Ertapenem                      S         S Gentamicin                     S          S Imipenem                                 S Levofloxacin                   S         S Meropenem                      S         S Nitrofurantoin                 R         S Piperacillin/Tazobactam        S         S Tetracycline                   R         S Tobramycin                     S         S Trimethoprim/Sulfa             S         S   Comprehensive metabolic panel     Status: Abnormal   Collection Time: 05/09/19 10:50 AM  Result Value Ref Range   Glucose 92 65 - 99 mg/dL   BUN 15 8 - 27 mg/dL   Creatinine, Ser 0.83 0.57 - 1.00 mg/dL   GFR calc non Af Amer 64 >59 mL/min/1.73   GFR calc Af Amer 74 >59 mL/min/1.73   BUN/Creatinine Ratio 18 12 - 28   Sodium 141 134 - 144 mmol/L   Potassium 5.4 (H) 3.5 - 5.2 mmol/L   Chloride 100 96 - 106 mmol/L   CO2 27 20 - 29 mmol/L   Calcium 10.5 (H) 8.7 - 10.3 mg/dL   Total Protein 7.6 6.0 - 8.5 g/dL   Albumin 4.5 3.6 - 4.6 g/dL   Globulin, Total 3.1 1.5 - 4.5 g/dL   Albumin/Globulin Ratio 1.5 1.2 - 2.2   Bilirubin Total 0.5 0.0 - 1.2 mg/dL   Alkaline Phosphatase 70 39 - 117 IU/L   AST 36 0 - 40 IU/L   ALT 31 0 - 32 IU/L  CBC     Status: Abnormal   Collection Time: 05/09/19 10:50 AM  Result Value Ref Range   WBC 7.1 3.4 - 10.8 x10E3/uL  RBC 5.51 (H) 3.77 - 5.28 x10E6/uL   Hemoglobin 17.3 (H) 11.1 - 15.9 g/dL   Hematocrit 51.3 (H) 34.0 - 46.6 %   MCV 93 79 - 97 fL   MCH 31.4 26.6 - 33.0 pg   MCHC 33.7 31.5 - 35.7 g/dL   RDW 13.4 11.7 - 15.4 %   Platelets 241 150 - 450 x10E3/uL  Lipid Panel w/o Chol/HDL Ratio     Status: None   Collection Time: 05/09/19 10:50 AM  Result Value Ref Range   Cholesterol, Total 191 100 - 199 mg/dL   Triglycerides 76 0 - 149 mg/dL   HDL 80 >39 mg/dL   VLDL Cholesterol Cal 14 5 - 40 mg/dL   LDL Chol Calc (NIH) 97 0 - 99 mg/dL  T4, free     Status: None   Collection Time: 05/09/19 10:50 AM  Result Value Ref Range   Free T4 1.64 0.82 - 1.77 ng/dL  TSH     Status: None   Collection  Time: 05/09/19 10:50 AM  Result Value Ref Range   TSH 2.570 0.450 - 4.500 uIU/mL  VITAMIN D 25 Hydroxy (Vit-D Deficiency, Fractures)     Status: None   Collection Time: 05/09/19 10:50 AM  Result Value Ref Range   Vit D, 25-Hydroxy 88.0 30.0 - 100.0 ng/mL    Comment: Vitamin D deficiency has been defined by the Center and an Endocrine Society practice guideline as a level of serum 25-OH vitamin D less than 20 ng/mL (1,2). The Endocrine Society went on to further define vitamin D insufficiency as a level between 21 and 29 ng/mL (2). 1. IOM (Institute of Medicine). 2010. Dietary reference    intakes for calcium and D. [place]: The    [employer]. 2. Holick MF, Binkley Pickrell, Bischoff-Ferrari HA, et al.    Evaluation, treatment, and prevention of vitamin D    deficiency: an Endocrine Society clinical practice    guideline. JCEM. 2011 Jul; 96(7):1911-30.   CBC with Differential     Status: Abnormal   Collection Time: 05/31/19 12:45 PM  Result Value Ref Range   WBC 7.2 4.0 - 10.5 K/uL   RBC 5.34 (H) 3.87 - 5.11 MIL/uL   Hemoglobin 16.6 (H) 12.0 - 15.0 g/dL   HCT 51.0 (H) 36.0 - 46.0 %   MCV 95.5 80.0 - 100.0 fL   MCH 31.1 26.0 - 34.0 pg   MCHC 32.5 30.0 - 36.0 g/dL   RDW 13.7 11.5 - 15.5 %   Platelets 232 150 - 400 K/uL   nRBC 0.0 0.0 - 0.2 %   Neutrophils Relative % 59 %   Neutro Abs 4.3 1.7 - 7.7 K/uL   Lymphocytes Relative 28 %   Lymphs Abs 2.0 0.7 - 4.0 K/uL   Monocytes Relative 10 %   Monocytes Absolute 0.7 0.1 - 1.0 K/uL   Eosinophils Relative 1 %   Eosinophils Absolute 0.1 0.0 - 0.5 K/uL   Basophils Relative 1 %   Basophils Absolute 0.1 0.0 - 0.1 K/uL   Immature Granulocytes 1 %   Abs Immature Granulocytes 0.04 0.00 - 0.07 K/uL    Comment: Performed at [facility], [address] [place],  [address]    Radiology: MM DIAG BREAST TOMO BILATERAL  Result Date: 09/11/2018 CLINICAL DATA:  Short-term follow-up post  ultrasound-guided core needle biopsies of the right breast with benign concordant results.History of bilateral benign excisional and core needle biopsies. EXAM: DIGITAL DIAGNOSTIC  BILATERAL MAMMOGRAM WITH CAD AND TOMO COMPARISON:  Previous exam(s). ACR Breast Density Category c: The breast tissue is heterogeneously dense, which may obscure small masses. FINDINGS: Mammographically, there are no suspicious masses, areas of nonsurgical architectural distortion or microcalcifications in either breast. Mammographic images were processed with CAD. IMPRESSION: No mammographic evidence of malignancy in either breast. RECOMMENDATION: Screening mammogram in one year.(Code:SM-B-01Y) I have discussed the findings and recommendations with the patient. Results were also provided in writing at the conclusion of the visit. If applicable, a reminder letter will be sent to the patient regarding the next appointment. BI-RADS CATEGORY  2: Benign. Electronically Signed   By: Fidela Salisbury M.D.   On: 09/11/2018 10:32    No results found.  No results found.    Assessment and Plan: Patient Active Problem List   Diagnosis Date Noted  . Encounter for general adult medical examination with abnormal findings 05/26/2019  . Palpitations 05/26/2019  . Screening for colon cancer 05/26/2019  . Cholecystitis 12/28/2018  . Pancreatitis 12/28/2018  . Oxygen dependent 12/28/2018  . Obstructive chronic bronchitis without exacerbation ([place]) 12/28/2018  . Screening for breast cancer 05/04/2018  . Family history of coronary artery disease 05/04/2018  . Vitamin D deficiency 05/04/2018  . Anxiety 10/31/2017  . Fibrocystic breast disease 10/31/2017  . History of melanoma 10/31/2017  . Sleep apnea 10/31/2017  . Hypothyroidism 10/31/2017  . Essential hypertension, benign 10/31/2017  . Urinary tract infection with hematuria 10/31/2017  . Dysuria 10/31/2017  . Erythrocytosis 05/06/2015  . Melanoma of trunk ([place]) 02/05/2015  .  Osteoporosis 02/19/2014    1. Obstructive sleep apnea she has significant symptoms which will require her to have a CPAP titration study done.  She has hypersomnia she is not morbidly obese.  So therefore this is more of an anatomical problem then related to weight.  Indeed her BMI is only 18 kg/m.  I will go ahead and schedule her for a CPAP titration study to be done so that we can have an optimal pressure determination 2. Oxygen dependence the patient has been on and off nocturnal oxygen. 3. COPD she could benefit from a follow-up on her PFTs to determine if there has been any worsening of her lung function.  General Counseling: I have discussed the findings of the evaluation and examination with Deaunna.  I have also discussed any further diagnostic evaluation thatmay be needed or ordered today. Keith verbalizes understanding of the findings of todays visit. We also reviewed her medications today and discussed drug interactions and side effects including but not limite2d excessive drowsiness and altered mental states. We also discussed that there is always a risk not just to her but also people around her. she has been encouraged to call the office with any questions or concerns that should arise related to todays visit.  No orders of the defined types were placed in this encounter.    Time spent: 25 minutes  I have personally obtained a history, examined the patient, evaluated laboratory and imaging results, formulated the assessment and plan and placed orders.    Allyne Gee, MD [facility] Pulmonary and Critical Care Sleep medicine

## 2019-07-16 ENCOUNTER — Telehealth: Payer: Self-pay

## 2019-07-16 NOTE — Telephone Encounter (Signed)
CONFIRMED AND SCREENED FOR 07-18-19 OV.

## 2019-07-18 ENCOUNTER — Other Ambulatory Visit: Payer: Self-pay

## 2019-07-18 ENCOUNTER — Ambulatory Visit (INDEPENDENT_AMBULATORY_CARE_PROVIDER_SITE_OTHER): Payer: Medicare Other

## 2019-07-18 DIAGNOSIS — G4733 Obstructive sleep apnea (adult) (pediatric): Secondary | ICD-10-CM | POA: Diagnosis not present

## 2019-07-18 NOTE — Progress Notes (Signed)
New CPAP setup  She was setup on resmed auto cpap S-10 at 5-10 cmH2o with climate line, heated humidifier, and a resmed N-30 nasal mask med. She and her husband had a good understanding of using cleaning and compliance of the cpap. Will follow up in clinic in 4 weeks and with Dr Humphrey Rolls in 6-8 weeks. Advised to continue nocturnal O2 until Dr does Joselyn Arrow and is determined no longer needs.

## 2019-07-27 DIAGNOSIS — L570 Actinic keratosis: Secondary | ICD-10-CM | POA: Diagnosis not present

## 2019-07-27 DIAGNOSIS — D2271 Melanocytic nevi of right lower limb, including hip: Secondary | ICD-10-CM | POA: Diagnosis not present

## 2019-07-27 DIAGNOSIS — Z872 Personal history of diseases of the skin and subcutaneous tissue: Secondary | ICD-10-CM | POA: Diagnosis not present

## 2019-07-27 DIAGNOSIS — D2272 Melanocytic nevi of left lower limb, including hip: Secondary | ICD-10-CM | POA: Diagnosis not present

## 2019-07-27 DIAGNOSIS — D0439 Carcinoma in situ of skin of other parts of face: Secondary | ICD-10-CM | POA: Diagnosis not present

## 2019-07-27 DIAGNOSIS — D485 Neoplasm of uncertain behavior of skin: Secondary | ICD-10-CM | POA: Diagnosis not present

## 2019-07-27 DIAGNOSIS — D2261 Melanocytic nevi of right upper limb, including shoulder: Secondary | ICD-10-CM | POA: Diagnosis not present

## 2019-07-27 DIAGNOSIS — Z85828 Personal history of other malignant neoplasm of skin: Secondary | ICD-10-CM | POA: Diagnosis not present

## 2019-07-27 DIAGNOSIS — Z85820 Personal history of malignant melanoma of skin: Secondary | ICD-10-CM | POA: Diagnosis not present

## 2019-07-27 DIAGNOSIS — X32XXXA Exposure to sunlight, initial encounter: Secondary | ICD-10-CM | POA: Diagnosis not present

## 2019-08-01 ENCOUNTER — Other Ambulatory Visit: Payer: Self-pay

## 2019-08-01 MED ORDER — LOVASTATIN 20 MG PO TABS: 20.0000 mg | ORAL_TABLET | Freq: Every day | ORAL | 2 refills | Status: DC

## 2019-08-05 NOTE — Patient Instructions (Signed)

## 2019-08-08 ENCOUNTER — Encounter: Payer: Self-pay | Admitting: Internal Medicine

## 2019-08-10 ENCOUNTER — Telehealth: Payer: Self-pay

## 2019-08-10 NOTE — Telephone Encounter (Signed)
[facility] ORDER FOR MEDICAL HOME EQUIPMENT SIGNED AND PLACED IN [employer] PAT[employer] FOLDER.

## 2019-08-13 ENCOUNTER — Telehealth: Payer: Self-pay

## 2019-08-13 NOTE — Telephone Encounter (Signed)
Confirmed patient cpap clinic download appt for 08/15/19

## 2019-08-14 ENCOUNTER — Other Ambulatory Visit: Payer: Self-pay | Admitting: Nurse Practitioner

## 2019-08-14 DIAGNOSIS — Z1231 Encounter for screening mammogram for malignant neoplasm of breast: Secondary | ICD-10-CM

## 2019-08-15 ENCOUNTER — Other Ambulatory Visit: Payer: Self-pay

## 2019-08-15 ENCOUNTER — Ambulatory Visit (INDEPENDENT_AMBULATORY_CARE_PROVIDER_SITE_OTHER): Payer: Medicare Other

## 2019-08-15 DIAGNOSIS — G4733 Obstructive sleep apnea (adult) (pediatric): Secondary | ICD-10-CM

## 2019-08-15 NOTE — Progress Notes (Signed)
95 percentile pressure 10    95th percentile leak 8.5   apnea index 0.5 /hr  apnea-hypopnea index  0.6 /hr   total days used  >4 hr 28 days  total days used <4 hr 0 days  Total compliance 96 percent  Doing very good, went over several question with getting supplies, and cleaning and using o2 with cpap

## 2019-08-27 ENCOUNTER — Telehealth: Payer: Self-pay

## 2019-08-27 NOTE — Telephone Encounter (Signed)
Confirmed appointment on 08/29/2019 and screened for covid. kl[facility] 

## 2019-08-29 ENCOUNTER — Ambulatory Visit (INDEPENDENT_AMBULATORY_CARE_PROVIDER_SITE_OTHER): Payer: Medicare Other | Admitting: Internal Medicine

## 2019-08-29 ENCOUNTER — Other Ambulatory Visit: Payer: Self-pay

## 2019-08-29 ENCOUNTER — Encounter: Payer: Self-pay | Admitting: Internal Medicine

## 2019-08-29 VITALS — BP 151/81 | HR 77 | Temp 97.3°F | Ht 62.0 in | Wt 98.8 lb

## 2019-08-29 DIAGNOSIS — I10 Essential (primary) hypertension: Secondary | ICD-10-CM | POA: Diagnosis not present

## 2019-08-29 DIAGNOSIS — G4733 Obstructive sleep apnea (adult) (pediatric): Secondary | ICD-10-CM | POA: Diagnosis not present

## 2019-08-29 NOTE — Progress Notes (Signed)
[facility] [place], [place] [address]  Pulmonary Sleep Medicine   Office Visit Note  Patient Name: Sabrina Zamora DOB: December 20, 1930 MRN [id number]  Date of Service: 08/29/2019  Complaints/HPI: Pt reports good compliance with CPAP therapy. Cleaning machine by hand, and changing filters and tubing as directed. Denies headaches, sinus issues, palpitations, or hemoptysis.  Says she doesn't love her CPAP but works through it and is surprised to see she has been having good compliance. Wants to continue to have good compliance as she feels this will be good for her overall health. No acute issues to address today.  ROS  General: (-) fever, (-) chills, (-) night sweats, (-) weakness Skin: (-) rashes, (-) itching,. Eyes: (-) visual changes, (-) redness, (-) itching. Nose and Sinuses: (-) nasal stuffiness or itchiness, (-) postnasal drip, (-) nosebleeds, (-) sinus trouble. Mouth and Throat: (-) sore throat, (-) hoarseness. Neck: (-) swollen glands, (-) enlarged thyroid, (-) neck pain. Respiratory: - cough, (-) bloody sputum, - shortness of breath, - wheezing. Cardiovascular: - ankle swelling, (-) chest pain. Lymphatic: (-) lymph node enlargement. Neurologic: (-) numbness, (-) tingling. Psychiatric: (-) anxiety, (-) depression   Current Medication: Outpatient Encounter Medications as of 08/29/2019  Medication Sig Note  . amLODipine (NORVASC) 2.5 MG tablet Take 1 to 2 tab po daily for blood pressure   . aspirin EC 81 MG tablet Take 81 mg by mouth. 02/04/2017: Takes every other day.  . Cholecalciferol (VITAMIN D PO) Take by mouth daily.  08/25/2018: Unsure of dose   . conjugated estrogens (PREMARIN) vaginal cream Place 1 Applicatorful vaginally daily. Use pea sized amount M-W-Fr before bedtime   . denosumab (PROLIA) 60 MG/ML SOSY injection Inject into the skin.   Marland Kitchen Fluticasone-Salmeterol (ADVAIR) 250-50 MCG/DOSE AEPB Inhale 1 puff into the lungs 2 (two) times daily.    Marland Kitchen levothyroxine (SYNTHROID) 25 MCG tablet Take 1.5 tab po Daily before breakfast   . lovastatin (MEVACOR) 20 MG tablet Take 1 tablet (20 mg total) by mouth at bedtime.   . Multiple Vitamin (MULTI-VITAMINS) TABS Take by mouth. 05/06/2015: Received from: [facility]  . mupirocin nasal ointment (BACTROBAN) 2 % Place 1 application into the nose at bedtime.   Marland Kitchen omeprazole (PRILOSEC) 20 MG capsule Take 20 mg by mouth every other day.  05/06/2015: Received from: [facility]  . Probiotic Product (PROBIOTIC DAILY PO) Take by mouth daily.   . Zinc 25 MG TABS Take by mouth daily.    No facility-administered encounter medications on file as of 08/29/2019.    Surgical History: Past Surgical History:  Procedure Laterality Date  . BREAST BIOPSY Left 2003   neg  . BREAST BIOPSY Right 09/27/2017   12:00 1 cmfn fibroadenomatous change   . BREAST BIOPSY Right 09/17/2017   12:00 3 cmfn benign breast and fibroadipose tissue  . BREAST EXCISIONAL BIOPSY Left 1988   excisional - neg  . CATARACT EXTRACTION W/PHACO Left 09/20/2017   Procedure: CATARACT EXTRACTION PHACO AND INTRAOCULAR LENS PLACEMENT ([place]) LEFT;  Surgeon: Leandrew Koyanagi, MD;  Location: [place];  Service: Ophthalmology;  Laterality: Left;  . CATARACT EXTRACTION W/PHACO Right 02/15/2018   Procedure: CATARACT EXTRACTION PHACO AND INTRAOCULAR LENS PLACEMENT ([place]) TOPICAL  RIGHT;  Surgeon: Leandrew Koyanagi, MD;  Location: [place];  Service: Ophthalmology;  Laterality: Right;  prefers early  . CHOLECYSTECTOMY      Medical History: Past Medical History:  Diagnosis Date  . Asthma   . Hypothyroidism   .  Osteoporosis   . Skin cancer 2012, 2016    Family History: Family History  Problem Relation Age of Onset  . Clotting disorder Sister   . Breast cancer Sister 72    Social History: Social History   Socioeconomic History  . Marital status: Married    Spouse name: Not on file  . Number of children:  Not on file  . Years of education: Not on file  . Highest education level: Not on file  Occupational History  . Not on file  Tobacco Use  . Smoking status: Never Smoker  . Smokeless tobacco: Never Used  Substance and Sexual Activity  . Alcohol use: Never    Alcohol/week: 0.0 standard drinks  . Drug use: Never  . Sexual activity: Not on file  Other Topics Concern  . Not on file  Social History Narrative  . Not on file   Social Determinants of Health   Financial Resource Strain:   . Difficulty of Paying Living Expenses: Not on file  Food Insecurity:   . Worried About Charity fundraiser in the Last Year: Not on file  . Ran Out of Food in the Last Year: Not on file  Transportation Needs:   . Lack of Transportation (Medical): Not on file  . Lack of Transportation (Non-Medical): Not on file  Physical Activity:   . Days of Exercise per Week: Not on file  . Minutes of Exercise per Session: Not on file  Stress:   . Feeling of Stress : Not on file  Social Connections:   . Frequency of Communication with Friends and Family: Not on file  . Frequency of Social Gatherings with Friends and Family: Not on file  . Attends Religious Services: Not on file  . Active Member of Clubs or Organizations: Not on file  . Attends Archivist Meetings: Not on file  . Marital Status: Not on file  Intimate Partner Violence:   . Fear of Current or Ex-Partner: Not on file  . Emotionally Abused: Not on file  . Physically Abused: Not on file  . Sexually Abused: Not on file    Vital Signs: Blood pressure (!) 151/81, pulse 77, temperature (!) 97.3 F (36.3 C), height 5\' 2"  (1.575 m), weight 98 lb 12.8 oz (44.8 kg), SpO2 96 %.  Examination: General Appearance: The patient is well-developed, well-nourished, and in no distress. Skin: Gross inspection of skin unremarkable. Head: normocephalic, no gross deformities. Eyes: no gross deformities noted. ENT: ears appear grossly normal no  exudates. Neck: Supple. No thyromegaly. No LAD. Respiratory: Clear lung sounds bilaterally. Cardiovascular: Normal S1 and S2 without murmur or rub. Extremities: No cyanosis. pulses are equal. Neurologic: Alert and oriented. No involuntary movements.  LABS: Recent Results (from the past 2160 hour(s))  CBC with Differential     Status: Abnormal   Collection Time: 05/31/19 12:45 PM  Result Value Ref Range   WBC 7.2 4.0 - 10.5 K/uL   RBC 5.34 (H) 3.87 - 5.11 MIL/uL   Hemoglobin 16.6 (H) 12.0 - 15.0 g/dL   HCT 51.0 (H) 36.0 - 46.0 %   MCV 95.5 80.0 - 100.0 fL   MCH 31.1 26.0 - 34.0 pg   MCHC 32.5 30.0 - 36.0 g/dL   RDW 13.7 11.5 - 15.5 %   Platelets 232 150 - 400 K/uL   nRBC 0.0 0.0 - 0.2 %   Neutrophils Relative % 59 %   Neutro Abs 4.3 1.7 - 7.7 K/uL   Lymphocytes  Relative 28 %   Lymphs Abs 2.0 0.7 - 4.0 K/uL   Monocytes Relative 10 %   Monocytes Absolute 0.7 0.1 - 1.0 K/uL   Eosinophils Relative 1 %   Eosinophils Absolute 0.1 0.0 - 0.5 K/uL   Basophils Relative 1 %   Basophils Absolute 0.1 0.0 - 0.1 K/uL   Immature Granulocytes 1 %   Abs Immature Granulocytes 0.04 0.00 - 0.07 K/uL    Comment: Performed at [facility], [address] [place], [place] [address]    Radiology: MM DIAG BREAST TOMO BILATERAL  Result Date: 09/11/2018 CLINICAL DATA:  Short-term follow-up post ultrasound-guided core needle biopsies of the right breast with benign concordant results.History of bilateral benign excisional and core needle biopsies. EXAM: DIGITAL DIAGNOSTIC BILATERAL MAMMOGRAM WITH CAD AND TOMO COMPARISON:  Previous exam(s). ACR Breast Density Category c: The breast tissue is heterogeneously dense, which may obscure small masses. FINDINGS: Mammographically, there are no suspicious masses, areas of nonsurgical architectural distortion or microcalcifications in either breast. Mammographic images were processed with CAD. IMPRESSION: No mammographic evidence of malignancy in either  breast. RECOMMENDATION: Screening mammogram in one year.(Code:SM-B-01Y) I have discussed the findings and recommendations with the patient. Results were also provided in writing at the conclusion of the visit. If applicable, a reminder letter will be sent to the patient regarding the next appointment. BI-RADS CATEGORY  2: Benign. Electronically Signed   By: Fidela Salisbury M.D.   On: 09/11/2018 10:32    No results found.  No results found.    Assessment and Plan: Patient Active Problem List   Diagnosis Date Noted  . Encounter for general adult medical examination with abnormal findings 05/26/2019  . Palpitations 05/26/2019  . Screening for colon cancer 05/26/2019  . Cholecystitis 12/28/2018  . Pancreatitis 12/28/2018  . Oxygen dependent 12/28/2018  . Obstructive chronic bronchitis without exacerbation ([place]) 12/28/2018  . Screening for breast cancer 05/04/2018  . Family history of coronary artery disease 05/04/2018  . Vitamin D deficiency 05/04/2018  . Anxiety 10/31/2017  . Fibrocystic breast disease 10/31/2017  . History of melanoma 10/31/2017  . Sleep apnea 10/31/2017  . Hypothyroidism 10/31/2017  . Essential hypertension, benign 10/31/2017  . Urinary tract infection with hematuria 10/31/2017  . Dysuria 10/31/2017  . Erythrocytosis 05/06/2015  . Melanoma of trunk ([place]) 02/05/2015  . Osteoporosis 02/19/2014    1. OSA (obstructive sleep apnea) Continue with CPAP therapy, overall good compliance, will continue to monitor.  2. Essential hypertension, benign Stable at today's visit, continue current therapy, continue to monitor.  General Counseling: I have discussed the findings of the evaluation and examination with Jeidy.  I have also discussed any further diagnostic evaluation thatmay be needed or ordered today. Lakeeta verbalizes understanding of the findings of todays visit. We also reviewed her medications today and discussed drug interactions and side effects including  but not limited excessive drowsiness and altered mental states. We also discussed that there is always a risk not just to her but also people around her. she has been encouraged to call the office with any questions or concerns that should arise related to todays visit.  No orders of the defined types were placed in this encounter.    Time spent: 20 This patient was seen by Orson Gear AGNP-C in Collaboration with Dr. Devona Konig as a part of collaborative care agreement.  I have personally obtained a history, examined the patient, evaluated laboratory and imaging results, formulated the assessment and plan and placed orders.  Allyne Gee, MD [facility] Pulmonary and Critical Care Sleep medicine

## 2019-08-31 ENCOUNTER — Other Ambulatory Visit: Payer: Self-pay

## 2019-08-31 ENCOUNTER — Inpatient Hospital Stay: Payer: Medicare Other | Attending: Oncology | Admitting: Oncology

## 2019-08-31 ENCOUNTER — Inpatient Hospital Stay: Payer: Medicare Other

## 2019-08-31 ENCOUNTER — Encounter: Payer: Self-pay | Admitting: Oncology

## 2019-08-31 VITALS — BP 166/88 | HR 69 | Temp 95.4°F | Resp 16 | Wt 98.7 lb

## 2019-08-31 DIAGNOSIS — D751 Secondary polycythemia: Secondary | ICD-10-CM | POA: Diagnosis not present

## 2019-08-31 DIAGNOSIS — Z79899 Other long term (current) drug therapy: Secondary | ICD-10-CM | POA: Insufficient documentation

## 2019-08-31 DIAGNOSIS — G4733 Obstructive sleep apnea (adult) (pediatric): Secondary | ICD-10-CM | POA: Diagnosis not present

## 2019-08-31 LAB — CBC WITH DIFFERENTIAL/PLATELET
Abs Immature Granulocytes: 0.03 10*3/uL (ref 0.00–0.07)
Basophils Absolute: 0.1 10*3/uL (ref 0.0–0.1)
Basophils Relative: 1 %
Eosinophils Absolute: 0.1 10*3/uL (ref 0.0–0.5)
Eosinophils Relative: 2 %
HCT: 48.7 % — ABNORMAL HIGH (ref 36.0–46.0)
Hemoglobin: 15.4 g/dL — ABNORMAL HIGH (ref 12.0–15.0)
Immature Granulocytes: 0 %
Lymphocytes Relative: 33 %
Lymphs Abs: 2.3 10*3/uL (ref 0.7–4.0)
MCH: 30.9 pg (ref 26.0–34.0)
MCHC: 31.6 g/dL (ref 30.0–36.0)
MCV: 97.6 fL (ref 80.0–100.0)
Monocytes Absolute: 0.7 10*3/uL (ref 0.1–1.0)
Monocytes Relative: 11 %
Neutro Abs: 3.6 10*3/uL (ref 1.7–7.7)
Neutrophils Relative %: 53 %
Platelets: 209 10*3/uL (ref 150–400)
RBC: 4.99 MIL/uL (ref 3.87–5.11)
RDW: 13.3 % (ref 11.5–15.5)
WBC: 6.8 10*3/uL (ref 4.0–10.5)
nRBC: 0 % (ref 0.0–0.2)

## 2019-08-31 NOTE — Progress Notes (Signed)
[facility] day:  08/31/19  Chief Complaint: Sabrina Zamora is a 86 y.o. female with a history of secondary polycythemia presents for follow up .   HPI:  The patient was last seen in the medical oncology clinic on 02/04/2017.  At that time, she felt good.  Exam was unremarkable.  Hematocrit was 46.1 and hemoglobin 15.6.  She had mot required a phlebotomy since 04/2015.  Follow-up appointments were spread out.  CBC was to be checked every 6 months.   Patient previously followed up with Dr. Mike Gip.  Patient switched care to me on 11/29/2018. Extensive chart review was performed.  #Secondary polycythemia Patient has a history of sleep apnea she wears oxygen at night but no CPAP mask.  Never smoker. Work-up on 01/11/2012 reviewed and negative Jak 2 V617F mutation, exon 12 mutation. 08/25/2018 CALR negative, JAK 2 E12-15 negative, MPL negative.  Patient has been on phlebotomy program to keep hematocrit less than 48 and then later to keep it less than 50.  #History of melanoma x3.  T1 a melanoma in August 2012.    INTERVAL HISTORY Sabrina Zamora is a 86 y.o. female who has above history reviewed by me today presents for follow up visit for management of secondary erythrocytosis. Problems and complaints are listed below: She recently saw Dermatology and had topical chemotherapy treatment to her forehead lesion.  She starts to use CPAP recently.  No new complaints.  .   Review of Systems  Constitutional: Negative for appetite change, chills, fatigue and fever.  HENT:   Negative for hearing loss and voice change.   Eyes: Negative for eye problems.  Respiratory: Negative for chest tightness and cough.   Cardiovascular: Negative for chest pain.  Gastrointestinal: Negative for abdominal distention, abdominal pain and blood in stool.  Endocrine: Negative for hot flashes.  Genitourinary: Negative for difficulty urinating and frequency.    Musculoskeletal: Negative for arthralgias.  Skin: Negative for itching and rash.       Skin cancer being treatment with chemotherapy cream.  Neurological: Negative for extremity weakness.  Hematological: Negative for adenopathy.  Psychiatric/Behavioral: Negative for confusion.    Past Medical History:  Diagnosis Date  . Asthma   . Hypothyroidism   . Osteoporosis   . Skin cancer 2012, 2016    Past Surgical History:  Procedure Laterality Date  . BREAST BIOPSY Left 2003   neg  . BREAST BIOPSY Right 09/27/2017   12:00 1 cmfn fibroadenomatous change   . BREAST BIOPSY Right 09/17/2017   12:00 3 cmfn benign breast and fibroadipose tissue  . BREAST EXCISIONAL BIOPSY Left 1988   excisional - neg  . CATARACT EXTRACTION W/PHACO Left 09/20/2017   Procedure: CATARACT EXTRACTION PHACO AND INTRAOCULAR LENS PLACEMENT ([place]) LEFT;  Surgeon: Leandrew Koyanagi, MD;  Location: [place];  Service: Ophthalmology;  Laterality: Left;  . CATARACT EXTRACTION W/PHACO Right 02/15/2018   Procedure: CATARACT EXTRACTION PHACO AND INTRAOCULAR LENS PLACEMENT ([place]) TOPICAL  RIGHT;  Surgeon: Leandrew Koyanagi, MD;  Location: [place];  Service: Ophthalmology;  Laterality: Right;  prefers early  . CHOLECYSTECTOMY      Family History  Problem Relation Age of Onset  . Clotting disorder Sister   . Breast cancer Sister 24    Social History:  reports that she has never smoked. She has never used smokeless tobacco. She reports that she does not drink alcohol or use drugs.  She has never smoked.  The patient  is alone today.  Allergies:  Allergies  Allergen Reactions  . Levofloxacin Rash and Shortness Of Breath  . Penicillins Hives and Rash  . Betaine Rash  . Ciprofloxacin Itching and Rash  . Sulfa Antibiotics Rash    Current Medications: Current Outpatient Medications  Medication Sig Dispense Refill  . amLODipine (NORVASC) 2.5 MG tablet Take 1 to 2 tab po daily for blood pressure  45 tablet 5  . aspirin EC 81 MG tablet Take 81 mg by mouth.    . Cholecalciferol (VITAMIN D PO) Take by mouth daily.     Marland Kitchen denosumab (PROLIA) 60 MG/ML SOSY injection Inject into the skin.    Marland Kitchen Fluticasone-Salmeterol (ADVAIR) 250-50 MCG/DOSE AEPB Inhale 1 puff into the lungs 2 (two) times daily. 60 each 3  . levothyroxine (SYNTHROID) 25 MCG tablet Take 1.5 tab po Daily before breakfast 135 tablet 0  . lovastatin (MEVACOR) 20 MG tablet Take 1 tablet (20 mg total) by mouth at bedtime. 90 tablet 2  . Multiple Vitamin (MULTI-VITAMINS) TABS Take by mouth.    . mupirocin nasal ointment (BACTROBAN) 2 % Place 1 application into the nose at bedtime. 10 g 1  . omeprazole (PRILOSEC) 20 MG capsule Take 20 mg by mouth every other day.     . Probiotic Product (PROBIOTIC DAILY PO) Take by mouth daily.    . Zinc 25 MG TABS Take by mouth daily.    Marland Kitchen conjugated estrogens (PREMARIN) vaginal cream Place 1 Applicatorful vaginally daily. Use pea sized amount M-W-Fr before bedtime (Patient not taking: Reported on 08/31/2019) 42.5 g 3   No current facility-administered medications for this visit.    Physical Exam: Blood pressure (!) 166/88, pulse 69, temperature (!) 95.4 F (35.2 C), resp. rate 16, weight 98 lb 11.2 oz (44.8 kg).  Physical Exam  Constitutional: She is oriented to person, place, and time. No distress.  Thin built, walks independently.   HENT:  Head: Normocephalic and atraumatic.  Nose: Nose normal.  Mouth/Throat: Oropharynx is clear and moist. No oropharyngeal exudate.  Eyes: Pupils are equal, round, and reactive to light. EOM are normal. No scleral icterus.  Cardiovascular: Normal rate and regular rhythm.  No murmur heard. Pulmonary/Chest: Effort normal. No respiratory distress. She has no rales. She exhibits no tenderness.  Abdominal: Soft. She exhibits no distension. There is no abdominal tenderness.  Musculoskeletal:        General: No edema. Normal range of motion.     Cervical back:  Normal range of motion and neck supple.  Neurological: She is alert and oriented to person, place, and time. No cranial nerve deficit. She exhibits normal muscle tone. Coordination normal.  Skin: Skin is warm and dry. She is not diaphoretic.  Forehead lesion erythema,s/p topical chemo treatment.   Psychiatric: Affect normal.     Orders Only on 08/31/2019  Component Date Value Ref Range Status  . WBC 08/31/2019 6.8  4.0 - 10.5 K/uL Final  . RBC 08/31/2019 4.99  3.87 - 5.11 MIL/uL Final  . Hemoglobin 08/31/2019 15.4* 12.0 - 15.0 g/dL Final  . HCT 08/31/2019 48.7* 36.0 - 46.0 % Final  . MCV 08/31/2019 97.6  80.0 - 100.0 fL Final  . MCH 08/31/2019 30.9  26.0 - 34.0 pg Final  . MCHC 08/31/2019 31.6  30.0 - 36.0 g/dL Final  . RDW 08/31/2019 13.3  11.5 - 15.5 % Final  . Platelets 08/31/2019 209  150 - 400 K/uL Final  . nRBC 08/31/2019 0.0  0.0 - 0.2 %  Final  . Neutrophils Relative % 08/31/2019 53  % Final  . Neutro Abs 08/31/2019 3.6  1.7 - 7.7 K/uL Final  . Lymphocytes Relative 08/31/2019 33  % Final  . Lymphs Abs 08/31/2019 2.3  0.7 - 4.0 K/uL Final  . Monocytes Relative 08/31/2019 11  % Final  . Monocytes Absolute 08/31/2019 0.7  0.1 - 1.0 K/uL Final  . Eosinophils Relative 08/31/2019 2  % Final  . Eosinophils Absolute 08/31/2019 0.1  0.0 - 0.5 K/uL Final  . Basophils Relative 08/31/2019 1  % Final  . Basophils Absolute 08/31/2019 0.1  0.0 - 0.1 K/uL Final  . Immature Granulocytes 08/31/2019 0  % Final  . Abs Immature Granulocytes 08/31/2019 0.03  0.00 - 0.07 K/uL Final   Performed at [facility], [address] [place], [place] [address]    Assessment:  KAWTHAR ENNEN is a 86 y.o. female present for follow up of erythrocytosis.  1. Obstructive sleep apnea   2. Secondary erythrocytosis    # secondary erythrocytosis, due to OSA Now on CPAP.  Labs are reviewed and discussed with patient. Hemoglobin has improved to 15.4, Hct 48.7.  No need for therapy phlebotomy today.   Follow up in 4 months.   We spent sufficient time to discuss many aspect of care, questions were answered to patient's satisfaction.   Earlie Server, MD  08/31/2019, 9:15 PM

## 2019-08-31 NOTE — Progress Notes (Signed)
Patient does not offer any problems today.  

## 2019-09-14 ENCOUNTER — Telehealth: Payer: Self-pay

## 2019-09-14 NOTE — Telephone Encounter (Signed)
CONFIRMATION OF VERBAL ORDER SIGNED FOR HOME MEDICAL EQUIPMENT AND PLACED IN [employer] PAT[employer] FOLDER.

## 2019-09-24 ENCOUNTER — Ambulatory Visit
Admission: RE | Admit: 2019-09-24 | Discharge: 2019-09-24 | Disposition: A | Discharge status: Home / Self Care | Payer: Medicare Other | Source: Ambulatory Visit | Attending: Nurse Practitioner | Admitting: Nurse Practitioner

## 2019-09-24 DIAGNOSIS — Z1231 Encounter for screening mammogram for malignant neoplasm of breast: Secondary | ICD-10-CM | POA: Diagnosis not present

## 2019-09-24 NOTE — Progress Notes (Signed)
Negative mammogram

## 2019-09-25 ENCOUNTER — Other Ambulatory Visit: Payer: Self-pay

## 2019-09-25 ENCOUNTER — Ambulatory Visit (INDEPENDENT_AMBULATORY_CARE_PROVIDER_SITE_OTHER): Payer: Medicare Other | Admitting: Nurse Practitioner

## 2019-09-25 ENCOUNTER — Encounter: Payer: Self-pay | Admitting: Nurse Practitioner

## 2019-09-25 VITALS — BP 158/80 | HR 79 | Temp 97.5°F | Resp 16 | Ht 62.0 in | Wt 99.0 lb

## 2019-09-25 DIAGNOSIS — R319 Hematuria, unspecified: Secondary | ICD-10-CM

## 2019-09-25 DIAGNOSIS — I10 Essential (primary) hypertension: Secondary | ICD-10-CM

## 2019-09-25 DIAGNOSIS — N390 Urinary tract infection, site not specified: Secondary | ICD-10-CM

## 2019-09-25 DIAGNOSIS — R300 Dysuria: Secondary | ICD-10-CM

## 2019-09-25 LAB — POCT URINALYSIS DIPSTICK
Bilirubin, UA: NEGATIVE
Glucose, UA: NEGATIVE
Ketones, UA: NEGATIVE
Nitrite, UA: NEGATIVE
Protein, UA: NEGATIVE
Spec Grav, UA: 1.01 (ref 1.010–1.025)
Urobilinogen, UA: 0.2 E.U./dL
pH, UA: 7.5 (ref 5.0–8.0)

## 2019-09-25 MED ORDER — CEFUROXIME AXETIL 500 MG PO TABS: 500.0000 mg | ORAL_TABLET | Freq: Two times a day (BID) | ORAL | 0 refills | Status: DC

## 2019-09-25 NOTE — Progress Notes (Signed)
[facility], [place] [address]  Internal MEDICINE  Office Visit Note  Patient Name: Sabrina Zamora  [id number]  [id number]  Date of Service: 09/25/2019   Pt is here for a sick visit.  Chief Complaint  Patient presents with  . Urinary Tract Infection     The patient is here for sick visit. States that she started having urinary frequency and urgency. She states that symptoms came and went a few times. Past few days have been getting worse. Today, she noted some blood in the urine as well as discharge. She denies nausea or vomiting. Denies fever.        Current Medication:  Outpatient Encounter Medications as of 09/25/2019  Medication Sig Note  . amLODipine (NORVASC) 2.5 MG tablet Take 1 to 2 tab po daily for blood pressure   . aspirin EC 81 MG tablet Take 81 mg by mouth. 02/04/2017: Takes every other day.  . Cholecalciferol (VITAMIN D PO) Take by mouth daily.  08/25/2018: Unsure of dose   . conjugated estrogens (PREMARIN) vaginal cream Place 1 Applicatorful vaginally daily. Use pea sized amount M-W-Fr before bedtime   . denosumab (PROLIA) 60 MG/ML SOSY injection Inject into the skin.   Marland Kitchen Fluticasone-Salmeterol (ADVAIR) 250-50 MCG/DOSE AEPB Inhale 1 puff into the lungs 2 (two) times daily.   Marland Kitchen levothyroxine (SYNTHROID) 25 MCG tablet Take 1.5 tab po Daily before breakfast   . lovastatin (MEVACOR) 20 MG tablet Take 1 tablet (20 mg total) by mouth at bedtime.   . Multiple Vitamin (MULTI-VITAMINS) TABS Take by mouth. 05/06/2015: Received from: [facility]  . mupirocin nasal ointment (BACTROBAN) 2 % Place 1 application into the nose at bedtime.   Marland Kitchen omeprazole (PRILOSEC) 20 MG capsule Take 20 mg by mouth every other day.  05/06/2015: Received from: [facility]  . Probiotic Product (PROBIOTIC DAILY PO) Take by mouth daily.   . Zinc 25 MG TABS Take by mouth daily.   . cefUROXime (CEFTIN) 500 MG tablet Take 1 tablet (500 mg total) by mouth 2  (two) times daily with a meal.    No facility-administered encounter medications on file as of 09/25/2019.      Medical History: Past Medical History:  Diagnosis Date  . Asthma   . Hypothyroidism   . Osteoporosis   . Skin cancer 2012, 2016     Today's Vitals   09/25/19 1115  BP: (!) 158/80  Pulse: 79  Resp: 16  Temp: (!) 97.5 F (36.4 C)  SpO2: 97%  Weight: 99 lb (44.9 kg)  Height: 5\' 2"  (1.575 m)   Body mass index is 18.11 kg/m.  Review of Systems  Constitutional: Negative for activity change, chills, fatigue and unexpected weight change.  HENT: Negative for congestion, postnasal drip, rhinorrhea, sneezing and sore throat.   Respiratory: Negative for cough, chest tightness, shortness of breath and wheezing.   Cardiovascular: Negative for chest pain and palpitations.  Gastrointestinal: Negative for abdominal pain, constipation, diarrhea, nausea and vomiting.  Genitourinary: Positive for dysuria, flank pain, frequency, hematuria and urgency.  Musculoskeletal: Negative for arthralgias, back pain, joint swelling and neck pain.  Skin: Negative for rash.  Neurological: Negative.  Negative for tremors and numbness.  Hematological: Negative for adenopathy. Does not bruise/bleed easily.  Psychiatric/Behavioral: Negative for behavioral problems (Depression), sleep disturbance and suicidal ideas. The patient is not nervous/anxious.     Physical Exam Vitals and nursing note reviewed.  Constitutional:      General:  She is not in acute distress.    Appearance: Normal appearance. She is well-developed. She is not diaphoretic.  HENT:     Head: Normocephalic and atraumatic.     Mouth/Throat:     Pharynx: No oropharyngeal exudate.  Eyes:     Pupils: Pupils are equal, round, and reactive to light.  Neck:     Thyroid: No thyromegaly.     Vascular: No JVD.     Trachea: No tracheal deviation.  Cardiovascular:     Rate and Rhythm: Normal rate and regular rhythm.     Heart  sounds: Normal heart sounds. No murmur. No friction rub. No gallop.   Pulmonary:     Effort: Pulmonary effort is normal. No respiratory distress.     Breath sounds: Normal breath sounds. No wheezing or rales.  Chest:     Chest wall: No tenderness.  Abdominal:     General: Bowel sounds are normal.     Palpations: Abdomen is soft.  Genitourinary:    Comments: Urine sample positive for moderate WBC and large blood.  Musculoskeletal:        General: Normal range of motion.     Cervical back: Normal range of motion and neck supple.  Lymphadenopathy:     Cervical: No cervical adenopathy.  Skin:    General: Skin is warm and dry.  Neurological:     Mental Status: She is alert and oriented to person, place, and time.     Cranial Nerves: No cranial nerve deficit.  Psychiatric:        Behavior: Behavior normal.        Thought Content: Thought content normal.        Judgment: Judgment normal.    Assessment/Plan: 1. Urinary tract infection with hematuria, site unspecified Start cefuroxime 500mg  twice daily for 10 days. Send urine for culture and sensitivity and adjust antibiotic as indicated.  - CULTURE, URINE COMPREHENSIVE - cefUROXime (CEFTIN) 500 MG tablet; Take 1 tablet (500 mg total) by mouth 2 (two) times daily with a meal.  Dispense: 20 tablet; Refill: 0  2. Dysuria Treat for infection.  - POCT Urinalysis Dipstick  3. Essential hypertension, benign Stable. Continue bp medication as prescribed   General Counseling: Sabrina Zamora verbalizes understanding of the findings of todays visit and agrees with plan of treatment. I have discussed any further diagnostic evaluation that may be needed or ordered today. We also reviewed her medications today. she has been encouraged to call the office with any questions or concerns that should arise related to todays visit.    Counseling:  This patient was seen by Leretha Pol FNP Collaboration with Dr Lavera Guise as a part of collaborative care  agreement  Orders Placed This Encounter  Procedures  . CULTURE, URINE COMPREHENSIVE  . POCT Urinalysis Dipstick    Meds ordered this encounter  Medications  . cefUROXime (CEFTIN) 500 MG tablet    Sig: Take 1 tablet (500 mg total) by mouth 2 (two) times daily with a meal.    Dispense:  20 tablet    Refill:  0    Order Specific Question:   Supervising Provider    Answer:   Lavera Guise [[id number]]    Time spent: 20 Minutes

## 2019-09-26 ENCOUNTER — Ambulatory Visit: Payer: Medicare Other | Admitting: Adult Health

## 2019-09-27 NOTE — Progress Notes (Signed)
Waiting on sensitivity results.

## 2019-09-28 LAB — CULTURE, URINE COMPREHENSIVE

## 2019-10-04 NOTE — Progress Notes (Signed)
Patient started on ceftin at visit.

## 2019-10-08 ENCOUNTER — Telehealth: Payer: Self-pay

## 2019-10-08 ENCOUNTER — Other Ambulatory Visit: Payer: Self-pay | Admitting: Nurse Practitioner

## 2019-10-08 DIAGNOSIS — R319 Hematuria, unspecified: Secondary | ICD-10-CM

## 2019-10-08 DIAGNOSIS — N390 Urinary tract infection, site not specified: Secondary | ICD-10-CM

## 2019-10-08 MED ORDER — CEFUROXIME AXETIL 500 MG PO TABS: 500.0000 mg | ORAL_TABLET | Freq: Two times a day (BID) | ORAL | 0 refills | Status: DC

## 2019-10-08 NOTE — Telephone Encounter (Signed)
Pt advised we send 7 more days antibiotic

## 2019-10-08 NOTE — Progress Notes (Signed)
Will do additional 7 days of cefuroxime. [facility]

## 2019-10-08 NOTE — Telephone Encounter (Signed)
Will do additional 7 days of cefuroxime. [facility] to [facility]

## 2019-10-15 ENCOUNTER — Other Ambulatory Visit: Payer: Self-pay

## 2019-10-15 MED ORDER — LEVOTHYROXINE SODIUM 25 MCG PO TABS: ORAL_TABLET | ORAL | 0 refills | Status: DC

## 2019-11-02 ENCOUNTER — Telehealth: Payer: Self-pay

## 2019-11-02 DIAGNOSIS — M810 Age-related osteoporosis without current pathological fracture: Secondary | ICD-10-CM | POA: Diagnosis not present

## 2019-11-02 NOTE — Telephone Encounter (Signed)
Confirmed and screened for 11-06-19 ov.

## 2019-11-06 ENCOUNTER — Ambulatory Visit (INDEPENDENT_AMBULATORY_CARE_PROVIDER_SITE_OTHER): Payer: Medicare Other | Admitting: Nurse Practitioner

## 2019-11-06 ENCOUNTER — Encounter: Payer: Self-pay | Admitting: Nurse Practitioner

## 2019-11-06 ENCOUNTER — Other Ambulatory Visit: Payer: Self-pay

## 2019-11-06 VITALS — BP 155/88 | HR 80 | Temp 97.2°F | Resp 16 | Ht 62.0 in | Wt 98.0 lb

## 2019-11-06 DIAGNOSIS — N390 Urinary tract infection, site not specified: Secondary | ICD-10-CM | POA: Diagnosis not present

## 2019-11-06 DIAGNOSIS — I10 Essential (primary) hypertension: Secondary | ICD-10-CM | POA: Diagnosis not present

## 2019-11-06 DIAGNOSIS — R300 Dysuria: Secondary | ICD-10-CM

## 2019-11-06 DIAGNOSIS — J449 Chronic obstructive pulmonary disease, unspecified: Secondary | ICD-10-CM | POA: Diagnosis not present

## 2019-11-06 LAB — POCT URINALYSIS DIPSTICK
Bilirubin, UA: NEGATIVE
Blood, UA: NEGATIVE
Glucose, UA: NEGATIVE
Ketones, UA: NEGATIVE
Nitrite, UA: NEGATIVE
Protein, UA: NEGATIVE
Spec Grav, UA: 1.01 (ref 1.010–1.025)
Urobilinogen, UA: 0.2 E.U./dL
pH, UA: 7.5 (ref 5.0–8.0)

## 2019-11-06 NOTE — Progress Notes (Signed)
[facility] [place],  [address]  Internal MEDICINE  Office Visit Note  Patient Name: Sabrina Zamora  [id number]  [id number]  Date of Service: 11/21/2019  Chief Complaint  Patient presents with  . Hypertension  . Hypothyroidism  . Osteoporosis    The patient is here for routine follow up. She was recently treated with cefuroxime 500mg  twice daily. She took this for a total of 17 days. She states that she has a few days when she has some burning with urination, however, today, she does not have any symptoms of UTI. She has seen urology in the past. Would treat bacteria for 7 days, then retest before further treatment given out. She states if needed, she would like to go back to see Dr. Hollice Espy, urology. Blood pressure is elevated a bit and this is normal for her when she first arrives to the office. She denies chest pain, chest pressure, or shortness of breath, or headache.       Current Medication: Outpatient Encounter Medications as of 11/06/2019  Medication Sig Note  . amLODipine (NORVASC) 2.5 MG tablet Take 1 to 2 tab po daily for blood pressure   . aspirin EC 81 MG tablet Take 81 mg by mouth. 02/04/2017: Takes every other day.  . cefUROXime (CEFTIN) 500 MG tablet Take 1 tablet (500 mg total) by mouth 2 (two) times daily with a meal.   . Cholecalciferol (VITAMIN D PO) Take by mouth daily.  08/25/2018: Unsure of dose   . conjugated estrogens (PREMARIN) vaginal cream Place 1 Applicatorful vaginally daily. Use pea sized amount M-W-Fr before bedtime   . denosumab (PROLIA) 60 MG/ML SOSY injection Inject into the skin.   Marland Kitchen levothyroxine (SYNTHROID) 25 MCG tablet Take 1.5 tab po Daily before breakfast   . lovastatin (MEVACOR) 20 MG tablet Take 1 tablet (20 mg total) by mouth at bedtime.   . Multiple Vitamin (MULTI-VITAMINS) TABS Take by mouth. 05/06/2015: Received from: [facility]  . mupirocin nasal ointment (BACTROBAN) 2 % Place 1 application  into the nose at bedtime.   Marland Kitchen omeprazole (PRILOSEC) 20 MG capsule Take 20 mg by mouth every other day.  05/06/2015: Received from: [facility]  . Probiotic Product (PROBIOTIC DAILY PO) Take by mouth daily.   . Zinc 25 MG TABS Take by mouth daily.   . [DISCONTINUED] Fluticasone-Salmeterol (ADVAIR) 250-50 MCG/DOSE AEPB Inhale 1 puff into the lungs 2 (two) times daily.    No facility-administered encounter medications on file as of 11/06/2019.    Surgical History: Past Surgical History:  Procedure Laterality Date  . BREAST BIOPSY Left 2003   neg  . BREAST BIOPSY Right 09/27/2017   12:00 1 cmfn fibroadenomatous change   . BREAST BIOPSY Right 09/17/2017   12:00 3 cmfn benign breast and fibroadipose tissue  . BREAST EXCISIONAL BIOPSY Left 1988   excisional - neg  . CATARACT EXTRACTION W/PHACO Left 09/20/2017   Procedure: CATARACT EXTRACTION PHACO AND INTRAOCULAR LENS PLACEMENT ([place]) LEFT;  Surgeon: Leandrew Koyanagi, MD;  Location: [facility];  Service: Ophthalmology;  Laterality: Left;  . CATARACT EXTRACTION W/PHACO Right 02/15/2018   Procedure: CATARACT EXTRACTION PHACO AND INTRAOCULAR LENS PLACEMENT ([place]) TOPICAL  RIGHT;  Surgeon: Leandrew Koyanagi, MD;  Location: [facility];  Service: Ophthalmology;  Laterality: Right;  prefers early  . CHOLECYSTECTOMY      Medical History: Past Medical History:  Diagnosis Date  . Asthma   . Hypothyroidism   . Osteoporosis   .  Skin cancer 2012, 2016    Family History: Family History  Problem Relation Age of Onset  . Clotting disorder Sister   . Breast cancer Sister 22    Social History   Socioeconomic History  . Marital status: Married    Spouse name: Not on file  . Number of children: Not on file  . Years of education: Not on file  . Highest education level: Not on file  Occupational History  . Not on file  Tobacco Use  . Smoking status: Never Smoker  . Smokeless tobacco: Never Used  Substance and Sexual  Activity  . Alcohol use: Never    Alcohol/week: 0.0 standard drinks  . Drug use: Never  . Sexual activity: Not on file  Other Topics Concern  . Not on file  Social History Narrative  . Not on file   Social Determinants of Health   Financial Resource Strain:   . Difficulty of Paying Living Expenses:   Food Insecurity:   . Worried About Charity fundraiser in the Last Year:   . Arboriculturist in the Last Year:   Transportation Needs:   . Film/video editor (Medical):   Marland Kitchen Lack of Transportation (Non-Medical):   Physical Activity:   . Days of Exercise per Week:   . Minutes of Exercise per Session:   Stress:   . Feeling of Stress :   Social Connections:   . Frequency of Communication with Friends and Family:   . Frequency of Social Gatherings with Friends and Family:   . Attends Religious Services:   . Active Member of Clubs or Organizations:   . Attends Archivist Meetings:   Marland Kitchen Marital Status:   Intimate Partner Violence:   . Fear of Current or Ex-Partner:   . Emotionally Abused:   Marland Kitchen Physically Abused:   . Sexually Abused:       Review of Systems  Constitutional: Negative for activity change, chills, fatigue and unexpected weight change.  HENT: Negative for congestion, postnasal drip, rhinorrhea, sneezing and sore throat.   Respiratory: Negative for cough, chest tightness, shortness of breath and wheezing.   Cardiovascular: Negative for chest pain and palpitations.  Gastrointestinal: Negative for abdominal pain, constipation, diarrhea, nausea and vomiting.  Endocrine: Negative for cold intolerance, heat intolerance, polydipsia and polyuria.  Genitourinary: Negative for dysuria, flank pain, frequency, hematuria and urgency.  Musculoskeletal: Negative for arthralgias, back pain, joint swelling and neck pain.  Skin: Negative for rash.  Allergic/Immunologic: Negative for environmental allergies.  Neurological: Negative for dizziness, tremors, numbness and  headaches.  Hematological: Negative for adenopathy. Does not bruise/bleed easily.  Psychiatric/Behavioral: Negative for behavioral problems (Depression), sleep disturbance and suicidal ideas. The patient is not nervous/anxious.     Today's Vitals   11/06/19 1340  BP: (!) 155/88  Pulse: 80  Resp: 16  Temp: (!) 97.2 F (36.2 C)  SpO2: 99%  Weight: 98 lb (44.5 kg)  Height: 5\' 2"  (1.575 m)   Body mass index is 17.92 kg/m.  Physical Exam Vitals and nursing note reviewed.  Constitutional:      General: She is not in acute distress.    Appearance: Normal appearance. She is well-developed. She is not diaphoretic.  HENT:     Head: Normocephalic and atraumatic.     Mouth/Throat:     Pharynx: No oropharyngeal exudate.  Eyes:     Pupils: Pupils are equal, round, and reactive to light.  Neck:     Thyroid: No  thyromegaly.     Vascular: No JVD.     Trachea: No tracheal deviation.  Cardiovascular:     Rate and Rhythm: Normal rate and regular rhythm.     Heart sounds: Normal heart sounds. No murmur. No friction rub. No gallop.   Pulmonary:     Effort: Pulmonary effort is normal. No respiratory distress.     Breath sounds: Normal breath sounds. No wheezing or rales.  Chest:     Chest wall: No tenderness.  Abdominal:     Palpations: Abdomen is soft.  Genitourinary:    Comments: Urine sample positive for trace WBC only.  Musculoskeletal:        General: Normal range of motion.     Cervical back: Normal range of motion and neck supple.  Lymphadenopathy:     Cervical: No cervical adenopathy.  Skin:    General: Skin is warm and dry.  Neurological:     Mental Status: She is alert and oriented to person, place, and time.     Cranial Nerves: No cranial nerve deficit.  Psychiatric:        Mood and Affect: Mood normal.        Behavior: Behavior normal.        Thought Content: Thought content normal.        Judgment: Judgment normal.   Assessment/Plan: 1. Dysuria Dipstick positive  for trace WBC only. Will send for culture and sensitivity. Will treat as indicated. Consider referral back to Dr. Erlene Quan as indicated.  - POCT Urinalysis Dipstick  2. Urinary tract infection without hematuria, site unspecified Dipstick positive for trace WBC only. Will send for culture and sensitivity. Will treat as indicated. Consider referral back to Dr. Erlene Quan as indicated.  - CULTURE, URINE COMPREHENSIVE  3. Essential hypertension, benign Generally stable. Continue bp medication as prescribed   4. Obstructive chronic bronchitis without exacerbation (HCC) Stable. Continue inhalers as prescribed   General Counseling: Sabrina Zamora verbalizes understanding of the findings of todays visit and agrees with plan of treatment. I have discussed any further diagnostic evaluation that may be needed or ordered today. We also reviewed her medications today. she has been encouraged to call the office with any questions or concerns that should arise related to todays visit.  Hypertension Counseling:   The following hypertensive lifestyle modification were recommended and discussed:  1. Limiting alcohol intake to less than 1 oz/day of ethanol:(24 oz of beer or 8 oz of wine or 2 oz of 100-proof whiskey). 2. Take baby ASA 81 mg daily. 3. Importance of regular aerobic exercise and losing weight. 4. Reduce dietary saturated fat and cholesterol intake for overall cardiovascular health. 5. Maintaining adequate dietary potassium, calcium, and magnesium intake. 6. Regular monitoring of the blood pressure. 7. Reduce sodium intake to less than 100 mmol/day (less than 2.3 gm of sodium or less than 6 gm of sodium choride)   This patient was seen by [place] with Dr Lavera Guise as a part of collaborative care agreement  Orders Placed This Encounter  Procedures  . CULTURE, URINE COMPREHENSIVE  . POCT Urinalysis Dipstick     Total time spent: 30 Minutes   Time spent includes review of  chart, medications, test results, and follow up plan with the patient.      Dr Lavera Guise Internal medicine

## 2019-11-08 ENCOUNTER — Telehealth: Payer: Self-pay

## 2019-11-08 LAB — CULTURE, URINE COMPREHENSIVE

## 2019-11-08 NOTE — Telephone Encounter (Signed)
Pt.notified

## 2019-11-08 NOTE — Telephone Encounter (Signed)
-----   Message from Ronnell Freshwater, NP sent at 11/08/2019 10:57 AM EDT ----- Can you please let the patient know that her urine sample is showing normal flora bacteria. If she is not having symptoms, this is something I would let alone for now. Thanks.

## 2019-11-08 NOTE — Progress Notes (Signed)
Can you please let the patient know that her urine sample is showing normal flora bacteria. If she is not having symptoms, this is something I would let alone for now. Thanks.

## 2019-11-09 ENCOUNTER — Other Ambulatory Visit: Payer: Self-pay

## 2019-11-09 DIAGNOSIS — J449 Chronic obstructive pulmonary disease, unspecified: Secondary | ICD-10-CM

## 2019-11-09 MED ORDER — FLUTICASONE-SALMETEROL 250-50 MCG/DOSE IN AEPB: 1.0000 | INHALATION_SPRAY | Freq: Two times a day (BID) | RESPIRATORY_TRACT | 3 refills | Status: DC

## 2019-11-26 ENCOUNTER — Other Ambulatory Visit: Payer: Self-pay

## 2019-11-26 MED ORDER — AMLODIPINE BESYLATE 2.5 MG PO TABS: ORAL_TABLET | ORAL | 5 refills | Status: DC

## 2019-11-30 DIAGNOSIS — L570 Actinic keratosis: Secondary | ICD-10-CM | POA: Diagnosis not present

## 2019-11-30 DIAGNOSIS — L218 Other seborrheic dermatitis: Secondary | ICD-10-CM | POA: Diagnosis not present

## 2019-11-30 DIAGNOSIS — X32XXXA Exposure to sunlight, initial encounter: Secondary | ICD-10-CM | POA: Diagnosis not present

## 2019-11-30 DIAGNOSIS — D485 Neoplasm of uncertain behavior of skin: Secondary | ICD-10-CM | POA: Diagnosis not present

## 2019-11-30 DIAGNOSIS — D1801 Hemangioma of skin and subcutaneous tissue: Secondary | ICD-10-CM | POA: Diagnosis not present

## 2019-12-12 DIAGNOSIS — C4359 Malignant melanoma of other part of trunk: Secondary | ICD-10-CM | POA: Diagnosis not present

## 2019-12-12 DIAGNOSIS — Z681 Body mass index (BMI) 19 or less, adult: Secondary | ICD-10-CM | POA: Diagnosis not present

## 2019-12-13 DIAGNOSIS — D485 Neoplasm of uncertain behavior of skin: Secondary | ICD-10-CM | POA: Diagnosis not present

## 2019-12-17 NOTE — Progress Notes (Signed)
12/18/19 9:09 AM   Driscilla Moats Bartolini 01-Jul-1931 [id number]  Referring provider: Lavera Guise, [place],  [place] [address] Chief Complaint  Patient presents with  . Follow-up    HPI: Sabrina Zamora is a 86 y.o. F who returns today for the evaluation and management of rUTIs.  Her UA on dip revealed large blood w/ moderate leckuocytes on 09/25/19. F/u urine culture revealed E. Coli. She was treated w/ ceftin at time of visit on 09/25/19 by PCP.   She reports of intermittent mild burning w/ urination.  This last about a day or so at a time and then resolve spontaneously.  Is primarily external radiates up towards her bladder.   She is asymptomatic today and denies dysuria or frequency.   She states of discontinuing topical estrogen cream 6-8 months ago (ran out). She has been taking 2 cranberry tablets daily and drinks water primarily.  Previously seen and evaluated for asymptomatic bacteriuria and more recently dysuria but she had begun 2020.  RUS 2019 unremarkable.  No history of stones.  +Urine cultures 09/25/19 indicative of E. Coli  05/08/19 indicative of P. Mirabilis  02/06/19 indicative of P. Mirabilis resistant to nitrofurantoin and tetracycline  01/22/19 indicative of C. farmeri  01/10/19 indicative of P. Mirabilis   PMH: Past Medical History:  Diagnosis Date  . Asthma   . Hypothyroidism   . Osteoporosis   . Skin cancer 2012, 2016    Surgical History: Past Surgical History:  Procedure Laterality Date  . BREAST BIOPSY Left 2003   neg  . BREAST BIOPSY Right 09/27/2017   12:00 1 cmfn fibroadenomatous change   . BREAST BIOPSY Right 09/17/2017   12:00 3 cmfn benign breast and fibroadipose tissue  . BREAST EXCISIONAL BIOPSY Left 1988   excisional - neg  . CATARACT EXTRACTION W/PHACO Left 09/20/2017   Procedure: CATARACT EXTRACTION PHACO AND INTRAOCULAR LENS PLACEMENT ([place]) LEFT;  Surgeon: Leandrew Koyanagi, MD;  Location: [facility];   Service: Ophthalmology;  Laterality: Left;  . CATARACT EXTRACTION W/PHACO Right 02/15/2018   Procedure: CATARACT EXTRACTION PHACO AND INTRAOCULAR LENS PLACEMENT ([place]) TOPICAL  RIGHT;  Surgeon: Leandrew Koyanagi, MD;  Location: [facility];  Service: Ophthalmology;  Laterality: Right;  prefers early  . CHOLECYSTECTOMY      Home Medications:  Allergies as of 12/18/2019      Reactions   Levofloxacin Rash, Shortness Of Breath   Penicillins Hives, Rash   Betaine Rash   Ciprofloxacin Itching, Rash   Sulfa Antibiotics Rash      Medication List       Accurate as of December 18, 2019 11:59 PM. If you have any questions, ask your nurse or doctor.        STOP taking these medications   cefUROXime 500 MG tablet Commonly known as: CEFTIN Stopped by: Hollice Espy, MD   omeprazole 20 MG capsule Commonly known as: PRILOSEC Stopped by: Hollice Espy, MD     TAKE these medications   amLODipine 2.5 MG tablet Commonly known as: NORVASC Take 1 to 2 tab po daily for blood pressure   aspirin EC 81 MG tablet Take 81 mg by mouth.   conjugated estrogens vaginal cream Commonly known as: Premarin Place 1 Applicatorful vaginally daily. Use pea sized amount M-W-Fr before bedtime   denosumab 60 MG/ML Sosy injection Commonly known as: PROLIA Inject into the skin.   estradiol 0.1 MG/GM vaginal cream Commonly known as: ESTRACE Place 1 Applicatorful vaginally at bedtime.  Started by: Hollice Espy, MD   Fluticasone-Salmeterol 250-50 MCG/DOSE Aepb Commonly known as: ADVAIR Inhale 1 puff into the lungs 2 (two) times daily.   levothyroxine 25 MCG tablet Commonly known as: SYNTHROID Take 1.5 tab po Daily before breakfast   lovastatin 20 MG tablet Commonly known as: MEVACOR Take 1 tablet (20 mg total) by mouth at bedtime.   Multi-Vitamins Tabs Take by mouth.   mupirocin nasal ointment 2 % Commonly known as: BACTROBAN Place 1 application into the nose at bedtime.   PROBIOTIC  DAILY PO Take by mouth daily.   VITAMIN D PO Take by mouth daily.   Zinc 25 MG Tabs Take by mouth daily.       Allergies:  Allergies  Allergen Reactions  . Levofloxacin Rash and Shortness Of Breath  . Penicillins Hives and Rash  . Betaine Rash  . Ciprofloxacin Itching and Rash  . Sulfa Antibiotics Rash    Family History: Family History  Problem Relation Age of Onset  . Clotting disorder Sister   . Breast cancer Sister 52    Social History:  reports that she has never smoked. She has never used smokeless tobacco. She reports that she does not drink alcohol or use drugs.   Physical Exam: BP (!) 172/89   Pulse 72   Ht 5\' 2"  (1.575 m)   Wt 98 lb (44.5 kg)   BMI 17.92 kg/m   Constitutional:  Alert and oriented, No acute distress. HEENT: [place] AT, moist mucus membranes.  Trachea midline, no masses. Cardiovascular: No clubbing, cyanosis, or edema. Respiratory: Normal respiratory effort, no increased work of breathing. Skin: No rashes, bruises or suspicious lesions. Neurologic: Grossly intact, no focal deficits, moving all 4 extremities. Psychiatric: Normal mood and affect.  Laboratory Data:  Urinalysis Negative  Assessment & Plan:    1. rUTIs UA today negative Encouraged to restart topical estrogen cream and continue cranberry tablets Rx of Premarin refilled  Recommended cysto w/ pelvic exam at next visit to r/o   Return for cysto or sooner if UTI symptoms worsen for next day visit w/ PA-C  2. Dysuria Suspect vaginal atrophy may be contributing factor especially that she was recently having symptoms and urine today is crystal clear, pelvic exam as above  [facility] [address], [place], [place] [address] [phone]  I, Lucas Mallow, am acting as a scribe for Dr. Hollice Espy,  I have reviewed the above documentation for accuracy and completeness, and I agree with the above.   Hollice Espy, MD   I  spent 30 total minutes on the day of the encounter including pre-visit review of the medical record, face-to-face time with the patient, and post visit ordering of labs/imaging/tests.

## 2019-12-18 ENCOUNTER — Other Ambulatory Visit: Payer: Self-pay

## 2019-12-18 ENCOUNTER — Ambulatory Visit (INDEPENDENT_AMBULATORY_CARE_PROVIDER_SITE_OTHER): Payer: Medicare Other | Admitting: Urology

## 2019-12-18 DIAGNOSIS — R300 Dysuria: Secondary | ICD-10-CM | POA: Diagnosis not present

## 2019-12-18 DIAGNOSIS — N390 Urinary tract infection, site not specified: Secondary | ICD-10-CM

## 2019-12-18 LAB — URINALYSIS, COMPLETE
Bilirubin, UA: NEGATIVE
Glucose, UA: NEGATIVE
Ketones, UA: NEGATIVE
Leukocytes,UA: NEGATIVE
Nitrite, UA: NEGATIVE
Protein,UA: NEGATIVE
Specific Gravity, UA: 1.015 (ref 1.005–1.030)
Urobilinogen, Ur: 0.2 mg/dL (ref 0.2–1.0)
pH, UA: 7 (ref 5.0–7.5)

## 2019-12-18 LAB — MICROSCOPIC EXAMINATION: Bacteria, UA: NONE SEEN

## 2019-12-18 MED ORDER — ESTRADIOL 0.1 MG/GM VA CREA: 1.0000 | TOPICAL_CREAM | Freq: Every day | VAGINAL | 12 refills | Status: DC

## 2019-12-18 NOTE — Patient Instructions (Signed)
Cystoscopy Cystoscopy is a procedure that is used to help diagnose and sometimes treat conditions that affect the lower urinary tract. The lower urinary tract includes the bladder and the urethra. The urethra is the tube that drains urine from the bladder. Cystoscopy is done using a thin, tube-shaped instrument with a light and camera at the end (cystoscope). The cystoscope may be hard or flexible, depending on the goal of the procedure. The cystoscope is inserted through the urethra, into the bladder. Cystoscopy may be recommended if you have:  Urinary tract infections that keep coming back.  Blood in the urine (hematuria).  An inability to control when you urinate (urinary incontinence) or an overactive bladder.  Unusual cells found in a urine sample.  A blockage in the urethra, such as a urinary stone.  Painful urination.  An abnormality in the bladder found during an intravenous pyelogram (IVP) or CT scan. Cystoscopy may also be done to remove a sample of tissue to be examined under a microscope (biopsy). What are the risks? Generally, this is a safe procedure. However, problems may occur, including:  Infection.  Bleeding.  What happens during the procedure?  1. You will be given one or more of the following: ? A medicine to numb the area (local anesthetic). 2. The area around the opening of your urethra will be cleaned. 3. The cystoscope will be passed through your urethra into your bladder. 4. Germ-free (sterile) fluid will flow through the cystoscope to fill your bladder. The fluid will stretch your bladder so that your health care provider can clearly examine your bladder walls. 5. Your doctor will look at the urethra and bladder. 6. The cystoscope will be removed The procedure may vary among health care providers  What can I expect after the procedure? After the procedure, it is common to have: 1. Some soreness or pain in your abdomen and urethra. 2. Urinary symptoms.  These include: ? Mild pain or burning when you urinate. Pain should stop within a few minutes after you urinate. This may last for up to 1 week. ? A small amount of blood in your urine for several days. ? Feeling like you need to urinate but producing only a small amount of urine. Follow these instructions at home: General instructions  Return to your normal activities as told by your health care provider.   Do not drive for 24 hours if you were given a sedative during your procedure.  Watch for any blood in your urine. If the amount of blood in your urine increases, call your health care provider.  If a tissue sample was removed for testing (biopsy) during your procedure, it is up to you to get your test results. Ask your health care provider, or the department that is doing the test, when your results will be ready.  Drink enough fluid to keep your urine pale yellow.  Keep all follow-up visits as told by your health care provider. This is important. Contact a health care provider if you:  Have pain that gets worse or does not get better with medicine, especially pain when you urinate.  Have trouble urinating.  Have more blood in your urine. Get help right away if you:  Have blood clots in your urine.  Have abdominal pain.  Have a fever or chills.  Are unable to urinate. Summary  Cystoscopy is a procedure that is used to help diagnose and sometimes treat conditions that affect the lower urinary tract.  Cystoscopy is done using   a thin, tube-shaped instrument with a light and camera at the end.  After the procedure, it is common to have some soreness or pain in your abdomen and urethra.  Watch for any blood in your urine. If the amount of blood in your urine increases, call your health care provider.  If you were prescribed an antibiotic medicine, take it as told by your health care provider. Do not stop taking the antibiotic even if you start to feel better. This  information is not intended to replace advice given to you by your health care provider. Make sure you discuss any questions you have with your health care provider. Document Revised: 06/20/2018 Document Reviewed: 06/20/2018 Elsevier Patient Education  2020 Elsevier Inc.   

## 2019-12-25 DIAGNOSIS — C439 Malignant melanoma of skin, unspecified: Secondary | ICD-10-CM | POA: Diagnosis not present

## 2019-12-27 ENCOUNTER — Encounter: Payer: Self-pay | Admitting: Oncology

## 2019-12-28 ENCOUNTER — Other Ambulatory Visit: Payer: Self-pay

## 2019-12-28 ENCOUNTER — Inpatient Hospital Stay (HOSPITAL_BASED_OUTPATIENT_CLINIC_OR_DEPARTMENT_OTHER): Payer: Medicare Other | Admitting: Oncology

## 2019-12-28 ENCOUNTER — Inpatient Hospital Stay: Payer: Medicare Other

## 2019-12-28 ENCOUNTER — Inpatient Hospital Stay: Payer: Medicare Other | Attending: Oncology

## 2019-12-28 VITALS — BP 150/89 | HR 80 | Temp 98.1°F | Resp 18

## 2019-12-28 VITALS — BP 155/91 | HR 78 | Temp 96.8°F | Resp 16 | Wt 96.8 lb

## 2019-12-28 DIAGNOSIS — D751 Secondary polycythemia: Secondary | ICD-10-CM

## 2019-12-28 DIAGNOSIS — G4733 Obstructive sleep apnea (adult) (pediatric): Secondary | ICD-10-CM | POA: Insufficient documentation

## 2019-12-28 DIAGNOSIS — Z79899 Other long term (current) drug therapy: Secondary | ICD-10-CM | POA: Diagnosis not present

## 2019-12-28 LAB — CBC WITH DIFFERENTIAL/PLATELET
Abs Immature Granulocytes: 0.05 10*3/uL (ref 0.00–0.07)
Basophils Absolute: 0.1 10*3/uL (ref 0.0–0.1)
Basophils Relative: 1 %
Eosinophils Absolute: 0.1 10*3/uL (ref 0.0–0.5)
Eosinophils Relative: 2 %
HCT: 50.1 % — ABNORMAL HIGH (ref 36.0–46.0)
Hemoglobin: 16.7 g/dL — ABNORMAL HIGH (ref 12.0–15.0)
Immature Granulocytes: 1 %
Lymphocytes Relative: 27 %
Lymphs Abs: 1.9 10*3/uL (ref 0.7–4.0)
MCH: 31.5 pg (ref 26.0–34.0)
MCHC: 33.3 g/dL (ref 30.0–36.0)
MCV: 94.5 fL (ref 80.0–100.0)
Monocytes Absolute: 0.7 10*3/uL (ref 0.1–1.0)
Monocytes Relative: 10 %
Neutro Abs: 4.2 10*3/uL (ref 1.7–7.7)
Neutrophils Relative %: 59 %
Platelets: 217 10*3/uL (ref 150–400)
RBC: 5.3 MIL/uL — ABNORMAL HIGH (ref 3.87–5.11)
RDW: 13.6 % (ref 11.5–15.5)
WBC: 7 10*3/uL (ref 4.0–10.5)
nRBC: 0 % (ref 0.0–0.2)

## 2019-12-28 NOTE — Progress Notes (Signed)
200cc of blood removed via therapeutic phlebotomy. Pt tolerated well. VSS at discharge.

## 2019-12-28 NOTE — Progress Notes (Signed)
[facility] day:  12/28/19  Chief Complaint: Sabrina Zamora is a 86 y.o. female with a history of secondary polycythemia presents for follow up .   HPI:  The patient was last seen in the medical oncology clinic on 02/04/2017.  At that time, she felt good.  Exam was unremarkable.  Hematocrit was 46.1 and hemoglobin 15.6.  She had mot required a phlebotomy since 04/2015.  Follow-up appointments were spread out.  CBC was to be checked every 6 months.   Patient previously followed up with Dr. Mike Gip.  Patient switched care to me on 11/29/2018. Extensive chart review was performed.  #Secondary polycythemia Patient has a history of sleep apnea she wears oxygen at night but no CPAP mask.  Never smoker. Work-up on 01/11/2012 reviewed and negative Jak 2 V617F mutation, exon 12 mutation. 08/25/2018 CALR negative, JAK 2 E12-15 negative, MPL negative.  Patient has been on phlebotomy program to keep hematocrit less than 48 and then later to keep it less than 50.  #History of melanoma x3.  T1 a melanoma in August 2012. saw Dermatology and had topical chemotherapy treatment to her forehead lesion.    INTERVAL HISTORY Sabrina Zamora is a 86 y.o. female who has above history reviewed by me today presents for follow up visit for management of secondary erythrocytosis. Problems and complaints are listed below: She has been compliant with CPAP machine.  No new complaints. Denies any cough or SOB   Review of Systems  Constitutional: Negative for appetite change, chills, fatigue and fever.  HENT:   Negative for hearing loss and voice change.   Eyes: Negative for eye problems.  Respiratory: Negative for chest tightness and cough.   Cardiovascular: Negative for chest pain.  Gastrointestinal: Negative for abdominal distention, abdominal pain and blood in stool.  Endocrine: Negative for hot flashes.  Genitourinary: Negative for difficulty urinating and  frequency.   Musculoskeletal: Negative for arthralgias.  Skin: Negative for itching and rash.  Neurological: Negative for extremity weakness.  Hematological: Negative for adenopathy.  Psychiatric/Behavioral: Negative for confusion.    Past Medical History:  Diagnosis Date  . Asthma   . Hypothyroidism   . Osteoporosis   . Skin cancer 2012, 2016    Past Surgical History:  Procedure Laterality Date  . BREAST BIOPSY Left 2003   neg  . BREAST BIOPSY Right 09/27/2017   12:00 1 cmfn fibroadenomatous change   . BREAST BIOPSY Right 09/17/2017   12:00 3 cmfn benign breast and fibroadipose tissue  . BREAST EXCISIONAL BIOPSY Left 1988   excisional - neg  . CATARACT EXTRACTION W/PHACO Left 09/20/2017   Procedure: CATARACT EXTRACTION PHACO AND INTRAOCULAR LENS PLACEMENT ([place]) LEFT;  Surgeon: Leandrew Koyanagi, MD;  Location: [place];  Service: Ophthalmology;  Laterality: Left;  . CATARACT EXTRACTION W/PHACO Right 02/15/2018   Procedure: CATARACT EXTRACTION PHACO AND INTRAOCULAR LENS PLACEMENT ([place]) TOPICAL  RIGHT;  Surgeon: Leandrew Koyanagi, MD;  Location: [place];  Service: Ophthalmology;  Laterality: Right;  prefers early  . CHOLECYSTECTOMY      Family History  Problem Relation Age of Onset  . Clotting disorder Sister   . Breast cancer Sister 49    Social History:  reports that she has never smoked. She has never used smokeless tobacco. She reports that she does not drink alcohol and does not use drugs.  She has never smoked.  The patient is alone today.  Allergies:  Allergies  Allergen Reactions  .  Levofloxacin Rash and Shortness Of Breath  . Penicillins Hives and Rash  . Betaine Rash  . Ciprofloxacin Itching and Rash  . Sulfa Antibiotics Rash    Current Medications: Current Outpatient Medications  Medication Sig Dispense Refill  . amLODipine (NORVASC) 2.5 MG tablet Take 1 to 2 tab po daily for blood pressure 45 tablet 5  . aspirin EC 81 MG  tablet Take 81 mg by mouth.    . Cholecalciferol (VITAMIN D PO) Take by mouth daily.     Marland Kitchen conjugated estrogens (PREMARIN) vaginal cream Place 1 Applicatorful vaginally daily. Use pea sized amount M-W-Fr before bedtime 42.5 g 3  . denosumab (PROLIA) 60 MG/ML SOSY injection Inject into the skin.    Marland Kitchen estradiol (ESTRACE) 0.1 MG/GM vaginal cream Place 1 Applicatorful vaginally at bedtime. 42.5 g 12  . Fluticasone-Salmeterol (ADVAIR) 250-50 MCG/DOSE AEPB Inhale 1 puff into the lungs 2 (two) times daily. 60 each 3  . levothyroxine (SYNTHROID) 25 MCG tablet Take 1.5 tab po Daily before breakfast 135 tablet 0  . lovastatin (MEVACOR) 20 MG tablet Take 1 tablet (20 mg total) by mouth at bedtime. 90 tablet 2  . Multiple Vitamin (MULTI-VITAMINS) TABS Take by mouth.    . mupirocin nasal ointment (BACTROBAN) 2 % Place 1 application into the nose at bedtime. 10 g 1  . Probiotic Product (PROBIOTIC DAILY PO) Take by mouth daily.    . Zinc 25 MG TABS Take by mouth daily.     No current facility-administered medications for this visit.    Physical Exam: Blood pressure (!) 155/91, pulse 78, temperature (!) 96.8 F (36 C), resp. rate 16, weight 96 lb 12.8 oz (43.9 kg).  Physical Exam Constitutional:      General: She is not in acute distress.    Appearance: She is not diaphoretic.     Comments: Thin built, walks independently.   HENT:     Head: Normocephalic and atraumatic.     Nose: Nose normal.     Mouth/Throat:     Pharynx: No oropharyngeal exudate.  Eyes:     General: No scleral icterus.    Pupils: Pupils are equal, round, and reactive to light.  Cardiovascular:     Rate and Rhythm: Normal rate and regular rhythm.     Heart sounds: No murmur heard.   Pulmonary:     Effort: Pulmonary effort is normal. No respiratory distress.     Breath sounds: No rales.  Chest:     Chest wall: No tenderness.  Abdominal:     General: There is no distension.     Palpations: Abdomen is soft.     Tenderness:  There is no abdominal tenderness.  Musculoskeletal:        General: Normal range of motion.     Cervical back: Normal range of motion and neck supple.  Skin:    General: Skin is warm and dry.     Findings: No erythema.  Neurological:     Mental Status: She is alert and oriented to person, place, and time.     Cranial Nerves: No cranial nerve deficit.     Motor: No abnormal muscle tone.     Coordination: Coordination normal.  Psychiatric:        Mood and Affect: Affect normal.      Appointment on 12/28/2019  Component Date Value Ref Range Status  . WBC 12/28/2019 7.0  4.0 - 10.5 K/uL Final  . RBC 12/28/2019 5.30* 3.87 - 5.11 MIL/uL Final  .  Hemoglobin 12/28/2019 16.7* 12.0 - 15.0 g/dL Final  . HCT 12/28/2019 50.1* 36 - 46 % Final  . MCV 12/28/2019 94.5  80.0 - 100.0 fL Final  . MCH 12/28/2019 31.5  26.0 - 34.0 pg Final  . MCHC 12/28/2019 33.3  30.0 - 36.0 g/dL Final  . RDW 12/28/2019 13.6  11.5 - 15.5 % Final  . Platelets 12/28/2019 217  150 - 400 K/uL Final  . nRBC 12/28/2019 0.0  0.0 - 0.2 % Final  . Neutrophils Relative % 12/28/2019 59  % Final  . Neutro Abs 12/28/2019 4.2  1.7 - 7.7 K/uL Final  . Lymphocytes Relative 12/28/2019 27  % Final  . Lymphs Abs 12/28/2019 1.9  0.7 - 4.0 K/uL Final  . Monocytes Relative 12/28/2019 10  % Final  . Monocytes Absolute 12/28/2019 0.7  0 - 1 K/uL Final  . Eosinophils Relative 12/28/2019 2  % Final  . Eosinophils Absolute 12/28/2019 0.1  0 - 0 K/uL Final  . Basophils Relative 12/28/2019 1  % Final  . Basophils Absolute 12/28/2019 0.1  0 - 0 K/uL Final  . Immature Granulocytes 12/28/2019 1  % Final  . Abs Immature Granulocytes 12/28/2019 0.05  0.00 - 0.07 K/uL Final   Performed at [facility], [address], [place], [place] [address]    Assessment:  GENEEN DIETER is a 86 y.o. female present for follow up of erythrocytosis.  1. Erythrocytosis   2. Obstructive sleep apnea    # secondary erythrocytosis, due to OSA Now on  CPAP.  Labs are reviewed and discussed with patient. Hemoglobin is 16.7, hematocrit 50.1.  Proceed with phlebotomy today. Repeat H&H monthly +/-phlebotomy  OSA, continue CPAP. It is unclear why her erythrocytosis is uncontrolled despite use of CPAP.  ?  Need of CPAP settings to be further adjusted versus underlying lung disease or other etiology.  I will consider image if erythrocytosis persists.  We spent sufficient time to discuss many aspect of care, questions were answered to patient's satisfaction.   Earlie Server, MD  12/28/2019, 9:43 PM

## 2020-01-11 ENCOUNTER — Other Ambulatory Visit: Payer: Self-pay

## 2020-01-11 MED ORDER — LEVOTHYROXINE SODIUM 25 MCG PO TABS: ORAL_TABLET | ORAL | 0 refills | Status: DC

## 2020-01-15 NOTE — Progress Notes (Signed)
° °  01/16/2020 CC:  Chief Complaint  Patient presents with   Cysto    HPI: Sabrina Zamora is a 86 y.o. female with rUTIs who presents today for a cysto.   Her UA on dip revealed large blood w/ moderate leckuocytes on 09/25/19. F/u urine culture revealed E. Coli. She was treated w/ ceftin at time of visit on 09/25/19 by PCP.   Urinalysis on 12/18/2019 showed trace RBC, WBC was 0-5, RBC was 0-2.   +Urine cultures 09/25/19 indicative of E. Coli  05/08/19 indicative of P. Mirabilis  02/06/19 indicative of P. Mirabilis resistant to nitrofurantoin and tetracycline  01/22/19 indicative of C. farmeri  01/10/19 indicative of P. Mirabilis    Blood pressure (!) 179/83, pulse 74, weight 96 lb 12.8 oz (43.9 kg).   NED. A&Ox3.   No respiratory distress   Abd soft, NT, ND Normal external genitalia with patent urethral meatus.  Periurethral and vaginal atrophy of the mucosa was also appreciated.  Cystoscopy Procedure Note  Patient identification was confirmed, informed consent was obtained, and patient was prepped using Betadine solution.  Lidocaine jelly was administered per urethral meatus.    Procedure: - Flexible cystoscope introduced, without any difficulty.   - Thorough search of the bladder revealed:    normal urethral meatus    normal urothelium    no stones    no ulcers     no tumors    no urethral polyps    no trabeculation  - Ureteral orifices were normal in position and appearance. Post-Procedure: - Patient tolerated the procedure well   Assessment/ Plan:  1. Recurrent UTIs Asymptomatic today Cystoscopy unremarkable Encouraged to continue topical estrogen cream and continue cranberry tablets    Follow up as needed with recurrent symptoms    I, Selena Batten, am acting as a scribe for Dr. Hollice Espy.  I have reviewed the above documentation for accuracy and completeness, and I agree with the above.   Hollice Espy, MD

## 2020-01-16 ENCOUNTER — Ambulatory Visit (INDEPENDENT_AMBULATORY_CARE_PROVIDER_SITE_OTHER): Payer: Medicare Other | Admitting: Urology

## 2020-01-16 ENCOUNTER — Other Ambulatory Visit: Payer: Self-pay

## 2020-01-16 VITALS — BP 179/83 | HR 74 | Wt 96.8 lb

## 2020-01-16 DIAGNOSIS — R300 Dysuria: Secondary | ICD-10-CM | POA: Diagnosis not present

## 2020-01-16 DIAGNOSIS — N390 Urinary tract infection, site not specified: Secondary | ICD-10-CM

## 2020-01-16 MED ORDER — CEPHALEXIN 250 MG PO CAPS
500.0000 mg | ORAL_CAPSULE | Freq: Once | ORAL | Status: AC
  Administered 2020-01-16: 500 mg via ORAL

## 2020-01-17 LAB — URINALYSIS, COMPLETE
Bilirubin, UA: NEGATIVE
Glucose, UA: NEGATIVE
Ketones, UA: NEGATIVE
Leukocytes,UA: NEGATIVE
Nitrite, UA: NEGATIVE
Protein,UA: NEGATIVE
RBC, UA: NEGATIVE
Specific Gravity, UA: 1.015 (ref 1.005–1.030)
Urobilinogen, Ur: 0.2 mg/dL (ref 0.2–1.0)
pH, UA: 7 (ref 5.0–7.5)

## 2020-01-17 LAB — MICROSCOPIC EXAMINATION: Bacteria, UA: NONE SEEN

## 2020-01-28 ENCOUNTER — Inpatient Hospital Stay: Payer: Medicare Other | Attending: Oncology

## 2020-01-28 ENCOUNTER — Other Ambulatory Visit: Payer: Self-pay

## 2020-01-28 DIAGNOSIS — D751 Secondary polycythemia: Secondary | ICD-10-CM

## 2020-01-28 DIAGNOSIS — Z85820 Personal history of malignant melanoma of skin: Secondary | ICD-10-CM | POA: Insufficient documentation

## 2020-01-28 DIAGNOSIS — G4733 Obstructive sleep apnea (adult) (pediatric): Secondary | ICD-10-CM | POA: Diagnosis not present

## 2020-01-28 LAB — COMPREHENSIVE METABOLIC PANEL
ALT: 26 U/L (ref 0–44)
AST: 31 U/L (ref 15–41)
Albumin: 3.9 g/dL (ref 3.5–5.0)
Alkaline Phosphatase: 58 U/L (ref 38–126)
Anion gap: 8 (ref 5–15)
BUN: 19 mg/dL (ref 8–23)
CO2: 32 mmol/L (ref 22–32)
Calcium: 9.4 mg/dL (ref 8.9–10.3)
Chloride: 101 mmol/L (ref 98–111)
Creatinine, Ser: 0.76 mg/dL (ref 0.44–1.00)
GFR calc Af Amer: >60 mL/min (ref >60)
GFR calc non Af Amer: >60 mL/min (ref >60)
Glucose, Bld: 99 mg/dL (ref 70–99)
Potassium: 5.1 mmol/L (ref 3.5–5.1)
Sodium: 141 mmol/L (ref 135–145)
Total Bilirubin: 0.8 mg/dL (ref 0.3–1.2)
Total Protein: 7.3 g/dL (ref 6.5–8.1)

## 2020-01-28 LAB — HEMATOCRIT: HCT: 46.3 % — ABNORMAL HIGH (ref 36.0–46.0)

## 2020-01-28 LAB — HEMOGLOBIN: Hemoglobin: 15.5 g/dL — ABNORMAL HIGH (ref 12.0–15.0)

## 2020-01-29 ENCOUNTER — Ambulatory Visit (INDEPENDENT_AMBULATORY_CARE_PROVIDER_SITE_OTHER): Payer: Medicare Other | Admitting: Physician Assistant

## 2020-01-29 ENCOUNTER — Encounter: Payer: Self-pay | Admitting: Physician Assistant

## 2020-01-29 VITALS — BP 159/85 | HR 80 | Ht 62.0 in | Wt 98.0 lb

## 2020-01-29 DIAGNOSIS — R300 Dysuria: Secondary | ICD-10-CM

## 2020-01-29 DIAGNOSIS — R3129 Other microscopic hematuria: Secondary | ICD-10-CM | POA: Diagnosis not present

## 2020-01-29 LAB — MICROSCOPIC EXAMINATION: WBC, UA: >30 /hpf — AB (ref 0–5)

## 2020-01-29 LAB — URINALYSIS, COMPLETE
Bilirubin, UA: NEGATIVE
Glucose, UA: NEGATIVE
Ketones, UA: NEGATIVE
Nitrite, UA: NEGATIVE
Protein,UA: NEGATIVE
Specific Gravity, UA: 1.015 (ref 1.005–1.030)
Urobilinogen, Ur: 0.2 mg/dL (ref 0.2–1.0)
pH, UA: 6.5 (ref 5.0–7.5)

## 2020-01-29 MED ORDER — CEFUROXIME AXETIL 250 MG PO TABS: 250.0000 mg | ORAL_TABLET | Freq: Two times a day (BID) | ORAL | 0 refills | Status: AC

## 2020-01-29 NOTE — Progress Notes (Signed)
01/29/2020 9:01 AM   Sabrina Zamora 04/26/1931 [id number]  CC: Chief Complaint  Patient presents with  . Other    HPI: Sabrina Zamora is a 86 y.o. female with PMH hematuria and rUTI on topical vaginal estrogen cream and cranberry supplements and hematuria who presents today for evaluation of possible UTI 13 days after cystoscopy with no significant findings.  Today she reports chronic dysuria and lower abdominal heaviness. She denies recent worsening of her symptoms and says these are typical for her. At her last visit with Dr. Erlene Quan on 01/16/2020, she denied urinary symptoms.   In-office UA today positive for 2+ blood and 2+ leukocyte esterase; urine microscopy with >30 WBCs/HPF, 3-10 RBCs/HPF, and many bacteria.   PMH: Past Medical History:  Diagnosis Date  . Asthma   . Hypothyroidism   . Osteoporosis   . Skin cancer 2012, 2016    Surgical History: Past Surgical History:  Procedure Laterality Date  . BREAST BIOPSY Left 2003   neg  . BREAST BIOPSY Right 09/27/2017   12:00 1 cmfn fibroadenomatous change   . BREAST BIOPSY Right 09/17/2017   12:00 3 cmfn benign breast and fibroadipose tissue  . BREAST EXCISIONAL BIOPSY Left 1988   excisional - neg  . CATARACT EXTRACTION W/PHACO Left 09/20/2017   Procedure: CATARACT EXTRACTION PHACO AND INTRAOCULAR LENS PLACEMENT ([place]) LEFT;  Surgeon: Leandrew Koyanagi, MD;  Location: [place];  Service: Ophthalmology;  Laterality: Left;  . CATARACT EXTRACTION W/PHACO Right 02/15/2018   Procedure: CATARACT EXTRACTION PHACO AND INTRAOCULAR LENS PLACEMENT ([place]) TOPICAL  RIGHT;  Surgeon: Leandrew Koyanagi, MD;  Location: [place];  Service: Ophthalmology;  Laterality: Right;  prefers early  . CHOLECYSTECTOMY      Home Medications:  Allergies as of 01/29/2020      Reactions   Levofloxacin Rash, Shortness Of Breath   Penicillins Hives, Rash   Betaine Rash   Ciprofloxacin Itching, Rash   Sulfa Antibiotics Rash        Medication List       Accurate as of January 29, 2020  9:01 AM. If you have any questions, ask your nurse or doctor.        amLODipine 2.5 MG tablet Commonly known as: NORVASC Take 1 to 2 tab po daily for blood pressure   aspirin EC 81 MG tablet Take 81 mg by mouth.   conjugated estrogens vaginal cream Commonly known as: Premarin Place 1 Applicatorful vaginally daily. Use pea sized amount M-W-Fr before bedtime   denosumab 60 MG/ML Sosy injection Commonly known as: PROLIA Inject into the skin.   estradiol 0.1 MG/GM vaginal cream Commonly known as: ESTRACE Place 1 Applicatorful vaginally at bedtime.   Fluticasone-Salmeterol 250-50 MCG/DOSE Aepb Commonly known as: ADVAIR Inhale 1 puff into the lungs 2 (two) times daily.   levothyroxine 25 MCG tablet Commonly known as: SYNTHROID Take 1.5 tab po Daily before breakfast   lovastatin 20 MG tablet Commonly known as: MEVACOR Take 1 tablet (20 mg total) by mouth at bedtime.   Multi-Vitamins Tabs Take by mouth.   mupirocin nasal ointment 2 % Commonly known as: BACTROBAN Place 1 application into the nose at bedtime.   PROBIOTIC DAILY PO Take by mouth daily.   VITAMIN D PO Take by mouth daily.   Zinc 25 MG Tabs Take by mouth daily.       Allergies:  Allergies  Allergen Reactions  . Levofloxacin Rash and Shortness Of Breath  . Penicillins Hives and Rash  .  Betaine Rash  . Ciprofloxacin Itching and Rash  . Sulfa Antibiotics Rash    Family History: Family History  Problem Relation Age of Onset  . Clotting disorder Sister   . Breast cancer Sister 42    Social History:   reports that she has never smoked. She has never used smokeless tobacco. She reports that she does not drink alcohol and does not use drugs.  Physical Exam: BP (!) 159/85   Pulse 80   Ht 5\' 2"  (1.575 m)   Wt 98 lb (44.5 kg)   BMI 17.92 kg/m   Constitutional:  Alert and oriented, no acute distress, nontoxic appearing HEENT: [place],  AT Cardiovascular: No clubbing, cyanosis, or edema Respiratory: Normal respiratory effort, no increased work of breathing Skin: No rashes, bruises or suspicious lesions Neurologic: Grossly intact, no focal deficits, moving all 4 extremities Psychiatric: Normal mood and affect  Laboratory Data: Results for orders placed or performed in visit on 01/29/20  Microscopic Examination   Urine  Result Value Ref Range   WBC, UA >30 (A) 0 - 5 /hpf   RBC 3-10 (A) 0 - 2 /hpf   Epithelial Cells (non renal) 0-10 0 - 10 /hpf   Bacteria, UA Many (A) None seen/Few  Urinalysis, Complete  Result Value Ref Range   Specific Gravity, UA 1.015 1.005 - 1.030   pH, UA 6.5 5.0 - 7.5   Color, UA Straw Yellow   Appearance Ur Cloudy (A) Clear   Leukocytes,UA 2+ (A) Negative   Protein,UA Negative Negative/Trace   Glucose, UA Negative Negative   Ketones, UA Negative Negative   RBC, UA 2+ (A) Negative   Bilirubin, UA Negative Negative   Urobilinogen, Ur 0.2 0.2 - 1.0 mg/dL   Nitrite, UA Negative Negative   Microscopic Examination See below:    Assessment & Plan:   1. Dysuria UA grossly infected especially compared to prior, though acuity of symptoms is unclear. With hematuria, will treat with empiric Ceftin and send for culture for further evaluation.  Will plan for repeat UA in 1 week. If MH persists, recommend CTU. - Urinalysis, Complete - CULTURE, URINE COMPREHENSIVE - cefUROXime (CEFTIN) 250 MG tablet; Take 1 tablet (250 mg total) by mouth 2 (two) times daily with a meal for 5 days.  Dispense: 10 tablet; Refill: 0   Return in about 1 week (around 02/05/2020) for Lab visit for UA.  Debroah Loop, PA-C  [facility] [address], [place], [place] [address] [phone]

## 2020-02-04 ENCOUNTER — Other Ambulatory Visit: Payer: Self-pay

## 2020-02-04 DIAGNOSIS — R300 Dysuria: Secondary | ICD-10-CM

## 2020-02-04 LAB — CULTURE, URINE COMPREHENSIVE

## 2020-02-05 ENCOUNTER — Other Ambulatory Visit: Payer: Self-pay

## 2020-02-05 ENCOUNTER — Telehealth: Payer: Self-pay | Admitting: Physician Assistant

## 2020-02-05 ENCOUNTER — Other Ambulatory Visit: Payer: Medicare Other

## 2020-02-05 DIAGNOSIS — R300 Dysuria: Secondary | ICD-10-CM

## 2020-02-05 MED ORDER — NITROFURANTOIN MONOHYD MACRO 100 MG PO CAPS: 100.0000 mg | ORAL_CAPSULE | Freq: Two times a day (BID) | ORAL | 0 refills | Status: AC

## 2020-02-05 NOTE — Telephone Encounter (Signed)
Notified patient as advised, explained to patient why the Macrobid was needed in addition to the [place]. Patient voiced understanding and agreed to start her Macrobid.

## 2020-02-05 NOTE — Telephone Encounter (Signed)
Please contact the patient and inform her that her urine culture has come back positive for 2 separate bacteria.  The Ceftin will treat one of her bacteria, but it will not treat the other.  I have sent Macrobid 100 mg twice daily x5 days to [facility].  Please counsel her to complete her course of Ceftin if she has not done so already.  Additionally, I would like her to start Macrobid ASAP.

## 2020-02-06 DIAGNOSIS — D0359 Melanoma in situ of other part of trunk: Secondary | ICD-10-CM | POA: Diagnosis not present

## 2020-02-06 DIAGNOSIS — L578 Other skin changes due to chronic exposure to nonionizing radiation: Secondary | ICD-10-CM | POA: Diagnosis not present

## 2020-02-06 DIAGNOSIS — Z85828 Personal history of other malignant neoplasm of skin: Secondary | ICD-10-CM | POA: Diagnosis not present

## 2020-02-06 DIAGNOSIS — L905 Scar conditions and fibrosis of skin: Secondary | ICD-10-CM | POA: Diagnosis not present

## 2020-02-06 LAB — MICROSCOPIC EXAMINATION: Bacteria, UA: NONE SEEN

## 2020-02-06 LAB — URINALYSIS, COMPLETE
Bilirubin, UA: NEGATIVE
Glucose, UA: NEGATIVE
Ketones, UA: NEGATIVE
Nitrite, UA: NEGATIVE
Protein,UA: NEGATIVE
Specific Gravity, UA: 1.01 (ref 1.005–1.030)
Urobilinogen, Ur: 0.2 mg/dL (ref 0.2–1.0)
pH, UA: 7 (ref 5.0–7.5)

## 2020-02-07 ENCOUNTER — Other Ambulatory Visit: Payer: Self-pay

## 2020-02-07 MED ORDER — LOVASTATIN 20 MG PO TABS: 20.0000 mg | ORAL_TABLET | Freq: Every day | ORAL | 2 refills | Status: DC

## 2020-02-13 ENCOUNTER — Ambulatory Visit: Payer: Medicare Other

## 2020-02-19 NOTE — Progress Notes (Signed)
02/20/2020 2:45 PM   Sabrina Zamora 1931-06-21 [id number]  Referring provider: Lavera Guise, MD [address] [place],  [place] [address] Chief Complaint  Patient presents with   Recurrent UTI    HPI: Sabrina Zamora is a 86 y.o. female presents today to discuss recent UTI and reoccurring ABX.   She was recently evaluated by Debroah Loop, PA-C on 01/29/2020 for possible UTI 13 days after cysto with no significant findings. UA was positive for 2+ blood and 2+ leukocyte esterase; urine microscopy with >30 WBCs/HPF, 3-10 RBCs/HPF, and many bacteria. Urine culture indicative of P. Mirabilis and Citrobacter koseri. Treated with Ceftin 250 mg BID x 5 days as well as Macrobid based on antibiotic resistance patterns.  Overall, she is improved. She reports some off and on burning. She continues to use topical estrogen cream.  She reports today that she is just extremely anxious.  She recently had another melanoma excised which was anxiety provoking.  She worries that she did not receive proper care for her infection.  Urinalysis today is essentially negative.  Today she reports some bilateral feet and ankles edema.   +Urine cultures 09/25/19 indicative of E. Coli  05/08/19 indicative of P. Mirabilis  02/06/19 indicative of P. Mirabilis resistant to nitrofurantoin and tetracycline  01/22/19 indicative of C. farmeri  01/10/19 indicative of P. Mirabilis  01/29/20 indicative of P. Mirabilis and Citrobacter koseri  PMH: Past Medical History:  Diagnosis Date   Asthma    Hypothyroidism    Osteoporosis    Skin cancer 2012, 2016    Surgical History: Past Surgical History:  Procedure Laterality Date   BREAST BIOPSY Left 2003   neg   BREAST BIOPSY Right 09/27/2017   12:00 1 cmfn fibroadenomatous change    BREAST BIOPSY Right 09/17/2017   12:00 3 cmfn benign breast and fibroadipose tissue   BREAST EXCISIONAL BIOPSY Left 1988   excisional - neg   CATARACT EXTRACTION  W/PHACO Left 09/20/2017   Procedure: CATARACT EXTRACTION PHACO AND INTRAOCULAR LENS PLACEMENT ([place]) LEFT;  Surgeon: Leandrew Koyanagi, MD;  Location: [facility];  Service: Ophthalmology;  Laterality: Left;   CATARACT EXTRACTION W/PHACO Right 02/15/2018   Procedure: CATARACT EXTRACTION PHACO AND INTRAOCULAR LENS PLACEMENT ([place]) TOPICAL  RIGHT;  Surgeon: Leandrew Koyanagi, MD;  Location: [facility];  Service: Ophthalmology;  Laterality: Right;  prefers early   CHOLECYSTECTOMY      Home Medications:  Allergies as of 02/20/2020      Reactions   Levofloxacin Rash, Shortness Of Breath   Penicillins Hives, Rash   Betaine Rash   Ciprofloxacin Itching, Rash   Sulfa Antibiotics Rash      Medication List       Accurate as of February 20, 2020  2:45 PM. If you have any questions, ask your nurse or doctor.        amLODipine 2.5 MG tablet Commonly known as: NORVASC Take 1 to 2 tab po daily for blood pressure   aspirin EC 81 MG tablet Take 81 mg by mouth.   conjugated estrogens vaginal cream Commonly known as: Premarin Place 1 Applicatorful vaginally daily. Use pea sized amount M-W-Fr before bedtime   denosumab 60 MG/ML Sosy injection Commonly known as: PROLIA Inject into the skin.   estradiol 0.1 MG/GM vaginal cream Commonly known as: ESTRACE Place 1 Applicatorful vaginally at bedtime.   Fluticasone-Salmeterol 250-50 MCG/DOSE Aepb Commonly known as: ADVAIR Inhale 1 puff into the lungs 2 (two) times daily.   levothyroxine  25 MCG tablet Commonly known as: SYNTHROID Take 1.5 tab po Daily before breakfast   lovastatin 20 MG tablet Commonly known as: MEVACOR Take 1 tablet (20 mg total) by mouth at bedtime.   Multi-Vitamins Tabs Take by mouth.   mupirocin nasal ointment 2 % Commonly known as: BACTROBAN Place 1 application into the nose at bedtime.   PROBIOTIC DAILY PO Take by mouth daily.   VITAMIN D PO Take by mouth daily.   Zinc 25 MG Tabs Take  by mouth daily.       Allergies:  Allergies  Allergen Reactions   Levofloxacin Rash and Shortness Of Breath   Penicillins Hives and Rash   Betaine Rash   Ciprofloxacin Itching and Rash   Sulfa Antibiotics Rash    Family History: Family History  Problem Relation Age of Onset   Clotting disorder Sister    Breast cancer Sister 41    Social History:  reports that she has never smoked. She has never used smokeless tobacco. She reports that she does not drink alcohol and does not use drugs.   Physical Exam: BP (!) 168/92    Pulse 88   Constitutional:  Alert and oriented, No acute distress. HEENT: [place] AT, moist mucus membranes.  Trachea midline, no masses. Cardiovascular: No clubbing, cyanosis, or edema. Respiratory: Normal respiratory effort, no increased work of breathing. Skin: No rashes, bruises or suspicious lesions. Neurologic: Grossly intact, no focal deficits, moving all 4 extremities. Psychiatric: Normal mood and affect. Tearful.  Laboratory Data:  Lab Results  Component Value Date   CREATININE 0.76 01/28/2020   Urinalysis Negative.  Assessment & Plan:    1. rUTIs UA is negative.  Continue topical estrogen cream 3 times/week Patient reassured Advised patient to return if she has any symptoms.  Return if symptoms worsen or fail to improve.  [facility] [address] [place], [place] [address] [phone]  I, Selena Batten, am acting as a scribe for Dr. Hollice Espy.  I have reviewed the above documentation for accuracy and completeness, and I agree with the above.   Hollice Espy, MD

## 2020-02-20 ENCOUNTER — Ambulatory Visit (INDEPENDENT_AMBULATORY_CARE_PROVIDER_SITE_OTHER): Payer: Medicare Other | Admitting: Urology

## 2020-02-20 ENCOUNTER — Ambulatory Visit (INDEPENDENT_AMBULATORY_CARE_PROVIDER_SITE_OTHER): Payer: Medicare Other

## 2020-02-20 ENCOUNTER — Encounter: Payer: Self-pay | Admitting: Urology

## 2020-02-20 ENCOUNTER — Other Ambulatory Visit: Payer: Self-pay

## 2020-02-20 VITALS — BP 168/92 | HR 88

## 2020-02-20 DIAGNOSIS — N390 Urinary tract infection, site not specified: Secondary | ICD-10-CM | POA: Diagnosis not present

## 2020-02-20 DIAGNOSIS — G4733 Obstructive sleep apnea (adult) (pediatric): Secondary | ICD-10-CM | POA: Diagnosis not present

## 2020-02-20 NOTE — Progress Notes (Signed)
95 percentile pressure 9.9   95th percentile leak 16.7   apnea index 0.5 /hr  apnea-hypopnea index  0.6 /hr   total days used  >4 hr 90 days  total days used <4 hr 0 days  Total compliance 100 percent  She is doing great wearing cpap, her o2 concentrator messed up she would like to do an overnight oximetry to see if she still needs the o2

## 2020-02-21 LAB — URINALYSIS, COMPLETE
Bilirubin, UA: NEGATIVE
Glucose, UA: NEGATIVE
Ketones, UA: NEGATIVE
Nitrite, UA: NEGATIVE
Protein,UA: NEGATIVE
Specific Gravity, UA: 1.015 (ref 1.005–1.030)
Urobilinogen, Ur: 0.2 mg/dL (ref 0.2–1.0)
pH, UA: 7 (ref 5.0–7.5)

## 2020-02-21 LAB — MICROSCOPIC EXAMINATION: Bacteria, UA: NONE SEEN

## 2020-02-25 ENCOUNTER — Ambulatory Visit: Payer: Medicare Other | Admitting: Internal Medicine

## 2020-02-25 ENCOUNTER — Inpatient Hospital Stay: Payer: Medicare Other | Attending: Oncology

## 2020-02-25 ENCOUNTER — Other Ambulatory Visit: Payer: Self-pay

## 2020-02-25 DIAGNOSIS — Z85820 Personal history of malignant melanoma of skin: Secondary | ICD-10-CM | POA: Diagnosis not present

## 2020-02-25 DIAGNOSIS — D751 Secondary polycythemia: Secondary | ICD-10-CM

## 2020-02-25 DIAGNOSIS — G4733 Obstructive sleep apnea (adult) (pediatric): Secondary | ICD-10-CM | POA: Insufficient documentation

## 2020-02-25 LAB — HEMATOCRIT: HCT: 47.3 % — ABNORMAL HIGH (ref 36.0–46.0)

## 2020-02-25 LAB — HEMOGLOBIN: Hemoglobin: 15.7 g/dL — ABNORMAL HIGH (ref 12.0–15.0)

## 2020-02-28 ENCOUNTER — Ambulatory Visit (INDEPENDENT_AMBULATORY_CARE_PROVIDER_SITE_OTHER): Payer: Medicare Other | Admitting: Internal Medicine

## 2020-02-28 ENCOUNTER — Other Ambulatory Visit: Payer: Self-pay

## 2020-02-28 ENCOUNTER — Encounter: Payer: Self-pay | Admitting: Internal Medicine

## 2020-02-28 DIAGNOSIS — J449 Chronic obstructive pulmonary disease, unspecified: Secondary | ICD-10-CM

## 2020-02-28 DIAGNOSIS — J4489 Other specified chronic obstructive pulmonary disease: Secondary | ICD-10-CM

## 2020-02-28 DIAGNOSIS — G4734 Idiopathic sleep related nonobstructive alveolar hypoventilation: Secondary | ICD-10-CM | POA: Diagnosis not present

## 2020-02-28 DIAGNOSIS — R0602 Shortness of breath: Secondary | ICD-10-CM | POA: Diagnosis not present

## 2020-02-28 DIAGNOSIS — G4733 Obstructive sleep apnea (adult) (pediatric): Secondary | ICD-10-CM | POA: Diagnosis not present

## 2020-02-28 NOTE — Progress Notes (Signed)
[facility] [place], [place] [address]  Pulmonary Sleep Medicine   Office Visit Note  Patient Name: Sabrina Zamora DOB: [birth year] MRN [id number]  Date of Service: 02/28/2020  Complaints/HPI:  Patient is here for routine pulmonary follow-up. Spirometry stable today. Continues to use Advair, managing her symptoms well Discussed the need to have PFT done as it has been several years since her last one Continues to use her CPAP nighty, reports cleaning her machine properly by hand and allowing water chamber to dry each day. Changing mask and tubing appropriately. Has been without supplemental oxygen she bleeds into her CPAP for some time as her concentrator stopped working. Discussed the need for overnight pulse oximetry   ROS  General: (-) fever, (-) chills, (-) night sweats, (-) weakness Skin: (-) rashes, (-) itching,. Eyes: (-) visual changes, (-) redness, (-) itching. Nose and Sinuses: (-) nasal stuffiness or itchiness, (-) postnasal drip, (-) nosebleeds, (-) sinus trouble. Mouth and Throat: (-) sore throat, (-) hoarseness. Neck: (-) swollen glands, (-) enlarged thyroid, (-) neck pain. Respiratory: - cough, (-) bloody sputum, - shortness of breath, - wheezing. Cardiovascular: + ankle swelling, (-) chest pain. Lymphatic: (-) lymph node enlargement. Neurologic: (-) numbness, (-) tingling. Psychiatric: (-) anxiety, (-) depression   Current Medication: Outpatient Encounter Medications as of 02/28/2020  Medication Sig Note  . amLODipine (NORVASC) 2.5 MG tablet Take 1 to 2 tab po daily for blood pressure   . aspirin EC 81 MG tablet Take 81 mg by mouth. 02/04/2017: Takes every other day.  . Cholecalciferol (VITAMIN D PO) Take by mouth daily.  08/25/2018: Unsure of dose   . conjugated estrogens (PREMARIN) vaginal cream Place 1 Applicatorful vaginally daily. Use pea sized amount M-W-Fr before bedtime   . denosumab (PROLIA) 60 MG/ML SOSY injection Inject into  the skin.   Marland Kitchen estradiol (ESTRACE) 0.1 MG/GM vaginal cream Place 1 Applicatorful vaginally at bedtime.   . Fluticasone-Salmeterol (ADVAIR) 250-50 MCG/DOSE AEPB Inhale 1 puff into the lungs 2 (two) times daily.   Marland Kitchen levothyroxine (SYNTHROID) 25 MCG tablet Take 1.5 tab po Daily before breakfast   . lovastatin (MEVACOR) 20 MG tablet Take 1 tablet (20 mg total) by mouth at bedtime.   . Multiple Vitamin (MULTI-VITAMINS) TABS Take by mouth. 05/06/2015: Received from: [facility]  . mupirocin nasal ointment (BACTROBAN) 2 % Place 1 application into the nose at bedtime.   . Probiotic Product (PROBIOTIC DAILY PO) Take by mouth daily.   . Zinc 25 MG TABS Take by mouth daily.    No facility-administered encounter medications on file as of 02/28/2020.    Surgical History: Past Surgical History:  Procedure Laterality Date  . BREAST BIOPSY Left 2003   neg  . BREAST BIOPSY Right 09/27/2017   12:00 1 cmfn fibroadenomatous change   . BREAST BIOPSY Right 09/17/2017   12:00 3 cmfn benign breast and fibroadipose tissue  . BREAST EXCISIONAL BIOPSY Left 1988   excisional - neg  . CATARACT EXTRACTION W/PHACO Left 09/20/2017   Procedure: CATARACT EXTRACTION PHACO AND INTRAOCULAR LENS PLACEMENT ([place]) LEFT;  Surgeon: Leandrew Koyanagi, MD;  Location: [facility];  Service: Ophthalmology;  Laterality: Left;  . CATARACT EXTRACTION W/PHACO Right 02/15/2018   Procedure: CATARACT EXTRACTION PHACO AND INTRAOCULAR LENS PLACEMENT ([place]) TOPICAL  RIGHT;  Surgeon: Leandrew Koyanagi, MD;  Location: [facility];  Service: Ophthalmology;  Laterality: Right;  prefers early  . CHOLECYSTECTOMY      Medical History: Past Medical History:  Diagnosis Date  . Asthma   . Hypothyroidism   . Osteoporosis   . Skin cancer 2012, 2016    Family History: Family History  Problem Relation Age of Onset  . Clotting disorder Sister   . Breast cancer Sister 5    Social History: Social History    Socioeconomic History  . Marital status: Married    Spouse name: Not on file  . Number of children: Not on file  . Years of education: Not on file  . Highest education level: Not on file  Occupational History  . Not on file  Tobacco Use  . Smoking status: Never Smoker  . Smokeless tobacco: Never Used  Vaping Use  . Vaping Use: Never used  Substance and Sexual Activity  . Alcohol use: Never    Alcohol/week: 0.0 standard drinks  . Drug use: Never  . Sexual activity: Not on file  Other Topics Concern  . Not on file  Social History Narrative  . Not on file   Social Determinants of Health   Financial Resource Strain:   . Difficulty of Paying Living Expenses: Not on file  Food Insecurity:   . Worried About Charity fundraiser in the Last Year: Not on file  . Ran Out of Food in the Last Year: Not on file  Transportation Needs:   . Lack of Transportation (Medical): Not on file  . Lack of Transportation (Non-Medical): Not on file  Physical Activity:   . Days of Exercise per Week: Not on file  . Minutes of Exercise per Session: Not on file  Stress:   . Feeling of Stress : Not on file  Social Connections:   . Frequency of Communication with Friends and Family: Not on file  . Frequency of Social Gatherings with Friends and Family: Not on file  . Attends Religious Services: Not on file  . Active Member of Clubs or Organizations: Not on file  . Attends Archivist Meetings: Not on file  . Marital Status: Not on file  Intimate Partner Violence:   . Fear of Current or Ex-Partner: Not on file  . Emotionally Abused: Not on file  . Physically Abused: Not on file  . Sexually Abused: Not on file    Vital Signs: Blood pressure 110/74, pulse 67, temperature (!) 97.2 F (36.2 C), resp. rate 16, height 5' 2.5" (1.588 m), weight 101 lb 12.8 oz (46.2 kg), SpO2 99 %.  Examination: General Appearance: The patient is well-developed, well-nourished, and in no distress. Skin:  Gross inspection of skin unremarkable. Head: normocephalic, no gross deformities. Eyes: no gross deformities noted. ENT: ears appear grossly normal no exudates. Neck: Supple. No thyromegaly. No LAD. Respiratory: Clear bilaterally. Cardiovascular: Normal S1 and S2 without murmur or rub. Extremities: No cyanosis. pulses are equal. Neurologic: Alert and oriented. No involuntary movements.  LABS: Recent Results (from the past 2160 hour(s))  Urinalysis, Complete     Status: Abnormal   Collection Time: 12/18/19  9:16 AM  Result Value Ref Range   Specific Gravity, UA 1.015 1.005 - 1.030   pH, UA 7.0 5.0 - 7.5   Color, UA Yellow Yellow   Appearance Ur Clear Clear   Leukocytes,UA Negative Negative   Protein,UA Negative Negative/Trace   Glucose, UA Negative Negative   Ketones, UA Negative Negative   RBC, UA Trace (A) Negative   Bilirubin, UA Negative Negative   Urobilinogen, Ur 0.2 0.2 - 1.0 mg/dL   Nitrite, UA Negative Negative  Microscopic Examination See below:   Microscopic Examination     Status: None   Collection Time: 12/18/19  9:16 AM   URINE  Result Value Ref Range   WBC, UA 0-5 0 - 5 /hpf   RBC 0-2 0 - 2 /hpf   Epithelial Cells (non renal) 0-10 0 - 10 /hpf   Bacteria, UA None seen None seen/Few  CBC with Differential/Platelet     Status: Abnormal   Collection Time: 12/28/19  1:05 PM  Result Value Ref Range   WBC 7.0 4.0 - 10.5 K/uL   RBC 5.30 (H) 3.87 - 5.11 MIL/uL   Hemoglobin 16.7 (H) 12.0 - 15.0 g/dL   HCT 50.1 (H) 36 - 46 %   MCV 94.5 80.0 - 100.0 fL   MCH 31.5 26.0 - 34.0 pg   MCHC 33.3 30.0 - 36.0 g/dL   RDW 13.6 11.5 - 15.5 %   Platelets 217 150 - 400 K/uL   nRBC 0.0 0.0 - 0.2 %   Neutrophils Relative % 59 %   Neutro Abs 4.2 1.7 - 7.7 K/uL   Lymphocytes Relative 27 %   Lymphs Abs 1.9 0.7 - 4.0 K/uL   Monocytes Relative 10 %   Monocytes Absolute 0.7 0 - 1 K/uL   Eosinophils Relative 2 %   Eosinophils Absolute 0.1 0 - 0 K/uL   Basophils Relative 1 %    Basophils Absolute 0.1 0 - 0 K/uL   Immature Granulocytes 1 %   Abs Immature Granulocytes 0.05 0.00 - 0.07 K/uL    Comment: Performed at [facility], [address] [place], [place] [address]  Urinalysis, Complete     Status: None   Collection Time: 01/16/20  9:42 AM  Result Value Ref Range   Specific Gravity, UA 1.015 1.005 - 1.030   pH, UA 7.0 5.0 - 7.5   Color, UA Yellow Yellow   Appearance Ur Clear Clear   Leukocytes,UA Negative Negative   Protein,UA Negative Negative/Trace   Glucose, UA Negative Negative   Ketones, UA Negative Negative   RBC, UA Negative Negative   Bilirubin, UA Negative Negative   Urobilinogen, Ur 0.2 0.2 - 1.0 mg/dL   Nitrite, UA Negative Negative   Microscopic Examination See below:   Microscopic Examination     Status: None   Collection Time: 01/16/20  9:42 AM   Urine  Result Value Ref Range   WBC, UA 0-5 0 - 5 /hpf   RBC 0-2 0 - 2 /hpf   Epithelial Cells (non renal) 0-10 0 - 10 /hpf   Bacteria, UA None seen None seen/Few  Comprehensive metabolic panel     Status: None   Collection Time: 01/28/20 10:29 AM  Result Value Ref Range   Sodium 141 135 - 145 mmol/L   Potassium 5.1 3.5 - 5.1 mmol/L   Chloride 101 98 - 111 mmol/L   CO2 32 22 - 32 mmol/L   Glucose, Bld 99 70 - 99 mg/dL    Comment: Glucose reference range applies only to samples taken after fasting for at least 8 hours.   BUN 19 8 - 23 mg/dL   Creatinine, Ser 0.76 0.44 - 1.00 mg/dL   Calcium 9.4 8.9 - 10.3 mg/dL   Total Protein 7.3 6.5 - 8.1 g/dL   Albumin 3.9 3.5 - 5.0 g/dL   AST 31 15 - 41 U/L   ALT 26 0 - 44 U/L   Alkaline Phosphatase 58 38 - 126 U/L  Total Bilirubin 0.8 0.3 - 1.2 mg/dL   GFR calc non Af Amer >60 >60 mL/min   GFR calc Af Amer >60 >60 mL/min   Anion gap 8 5 - 15    Comment: Performed at [facility], [address] [place], [place] [address]  Hemoglobin     Status: Abnormal   Collection Time: 01/28/20 10:29 AM  Result Value Ref Range   Hemoglobin  15.5 (H) 12.0 - 15.0 g/dL    Comment: Performed at [facility], [address] [place], [place] [address]  Hematocrit     Status: Abnormal   Collection Time: 01/28/20 10:29 AM  Result Value Ref Range   HCT 46.3 (H) 36 - 46 %    Comment: Performed at [facility], [address] [place], [place] [address]  Urinalysis, Complete     Status: Abnormal   Collection Time: 01/29/20  8:36 AM  Result Value Ref Range   Specific Gravity, UA 1.015 1.005 - 1.030   pH, UA 6.5 5.0 - 7.5   Color, UA Straw Yellow   Appearance Ur Cloudy (A) Clear   Leukocytes,UA 2+ (A) Negative   Protein,UA Negative Negative/Trace   Glucose, UA Negative Negative   Ketones, UA Negative Negative   RBC, UA 2+ (A) Negative   Bilirubin, UA Negative Negative   Urobilinogen, Ur 0.2 0.2 - 1.0 mg/dL   Nitrite, UA Negative Negative   Microscopic Examination See below:   Microscopic Examination     Status: Abnormal   Collection Time: 01/29/20  8:36 AM   Urine  Result Value Ref Range   WBC, UA >30 (A) 0 - 5 /hpf   RBC 3-10 (A) 0 - 2 /hpf   Epithelial Cells (non renal) 0-10 0 - 10 /hpf   Bacteria, UA Many (A) None seen/Few  CULTURE, URINE COMPREHENSIVE     Status: Abnormal   Collection Time: 01/29/20  9:05 AM   Specimen: Urine   UR  Result Value Ref Range   Urine Culture, Comprehensive Final report (A)    Organism ID, Bacteria Proteus mirabilis (A)     Comment: 2,000 Colonies/mL Cefazolin <=4 ug/mL Cefazolin with an MIC <=16 predicts susceptibility to the oral agents cefaclor, cefdinir, cefpodoxime, cefprozil, cefuroxime, cephalexin, and loracarbef when used for therapy of uncomplicated urinary tract infections due to E. coli, Klebsiella pneumoniae, and Proteus mirabilis.    Organism ID, Bacteria Citrobacter koseri (A)     Comment: Greater than 100,000 colony forming units per mL   ANTIMICROBIAL SUSCEPTIBILITY Comment     Comment:       ** S = Susceptible; I = Intermediate; R = Resistant **                     P = Positive; N = Negative             MICS are expressed in micrograms per mL    Antibiotic                 RSLT#1    RSLT#2    RSLT#3    RSLT#4 Amoxicillin/Clavulanic Acid    S         S Ampicillin                     S Cefepime                       S  S Ceftriaxone                    S         S Cefuroxime                     S         I Ciprofloxacin                  S         S Ertapenem                      S         S Gentamicin                     S         S Imipenem                                 S Levofloxacin                   S         S Meropenem                      S         S Nitrofurantoin                 R         S Piperacillin/Tazobactam        S         S Tetracycline                   R         S Tobramycin                     S         S Trimethoprim/Sulfa             S         S   Urinalysis, Complete     Status: Abnormal   Collection Time: 02/05/20  1:09 PM  Result Value Ref Range   Specific Gravity, UA 1.010 1.005 - 1.030   pH, UA 7.0 5.0 - 7.5   Color, UA Straw Yellow   Appearance Ur Hazy (A) Clear   Leukocytes,UA Trace (A) Negative   Protein,UA Negative Negative/Trace   Glucose, UA Negative Negative   Ketones, UA Negative Negative   RBC, UA Trace (A) Negative   Bilirubin, UA Negative Negative   Urobilinogen, Ur 0.2 0.2 - 1.0 mg/dL   Nitrite, UA Negative Negative   Microscopic Examination See below:   Microscopic Examination     Status: Abnormal   Collection Time: 02/05/20  1:09 PM   Urine  Result Value Ref Range   WBC, UA 0-5 0 - 5 /hpf   RBC 0-2 0 - 2 /hpf   Epithelial Cells (non renal) 0-10 0 - 10 /hpf   Renal Epithel, UA 0-10 (A) None seen /hpf   Bacteria, UA None seen None seen/Few  Urinalysis, Complete     Status: Abnormal   Collection Time: 02/20/20  2:22 PM  Result Value Ref Range   Specific Gravity, UA 1.015 1.005 - 1.030   pH, UA 7.0 5.0 - 7.5   Color,  UA Yellow Yellow   Appearance Ur Clear Clear    Leukocytes,UA Trace (A) Negative   Protein,UA Negative Negative/Trace   Glucose, UA Negative Negative   Ketones, UA Negative Negative   RBC, UA Trace (A) Negative   Bilirubin, UA Negative Negative   Urobilinogen, Ur 0.2 0.2 - 1.0 mg/dL   Nitrite, UA Negative Negative   Microscopic Examination See below:   Microscopic Examination     Status: Abnormal   Collection Time: 02/20/20  2:22 PM   Urine  Result Value Ref Range   WBC, UA 0-5 0 - 5 /hpf   RBC 0-2 0 - 2 /hpf   Epithelial Cells (non renal) 0-10 0 - 10 /hpf   Renal Epithel, UA 0-10 (A) None seen /hpf   Bacteria, UA None seen None seen/Few  Hematocrit     Status: Abnormal   Collection Time: 02/25/20 10:52 AM  Result Value Ref Range   HCT 47.3 (H) 36 - 46 %    Comment: Performed at [facility], [address] [place], [place] [address]  Hemoglobin     Status: Abnormal   Collection Time: 02/25/20 10:52 AM  Result Value Ref Range   Hemoglobin 15.7 (H) 12.0 - 15.0 g/dL    Comment: Performed at [facility], [address], [place], [place] [address]    Radiology: MM 3D SCREEN BREAST BILATERAL  Result Date: 09/24/2019 CLINICAL DATA:  Screening. EXAM: DIGITAL SCREENING BILATERAL MAMMOGRAM WITH TOMO AND CAD COMPARISON:  Previous exam(s). ACR Breast Density Category c: The breast tissue is heterogeneously dense, which may obscure small masses. FINDINGS: There are no findings suspicious for malignancy. Images were processed with CAD. IMPRESSION: No mammographic evidence of malignancy. A result letter of this screening mammogram will be mailed directly to the patient. RECOMMENDATION: Screening mammogram in one year. (Code:SM-B-01Y) BI-RADS CATEGORY  1: Negative. Electronically Signed   By: Ammie Ferrier M.D.   On: 09/24/2019 15:01   Assessment and Plan: Patient Active Problem List   Diagnosis Date Noted  . Encounter for general adult medical examination with abnormal findings 05/26/2019  . Palpitations 05/26/2019  .  Screening for colon cancer 05/26/2019  . Cholecystitis 12/28/2018  . Pancreatitis 12/28/2018  . Oxygen dependent 12/28/2018  . Obstructive chronic bronchitis without exacerbation ([place]) 12/28/2018  . Screening for breast cancer 05/04/2018  . Family history of coronary artery disease 05/04/2018  . Vitamin D deficiency 05/04/2018  . Anxiety 10/31/2017  . Fibrocystic breast disease 10/31/2017  . History of melanoma 10/31/2017  . Sleep apnea 10/31/2017  . Hypothyroidism 10/31/2017  . Essential hypertension, benign 10/31/2017  . Urinary tract infection without hematuria 10/31/2017  . Dysuria 10/31/2017  . Erythrocytosis 05/06/2015  . Melanoma of trunk ([place]) 02/05/2015  . Osteoporosis 02/19/2014    1. OSA (obstructive sleep apnea) Continue with CPAP at current pressures at this time. Encouraged to continue with nighty use and while taking naps longer than 20 minutes. Avoid So Clean machine and clean machine by hand using warm water and vinegar weekly and allowing water chamber to dry completely each day.  2. Nocturnal hypoxia Concentrator for supplemental oxygen she bleeds into CPAP has been broken for some time. Will assess her need for supplemental oxygen with overnight oximetry. Will adjust plan of care based on results. - Pulse oximetry, overnight; Future  3. Obstructive chronic bronchitis without exacerbation ([place]) Spirometry stable today. Schedule for PFT to assess as it has been several years since last PFT. - Pulmonary function test; Future  4. SOB (shortness of breath) - Spirometry with Graph  General Counseling: I have discussed the findings of the evaluation and examination with Shallyn.  I have also discussed any further diagnostic evaluation thatmay be needed or ordered today. D[place]den verbalizes understanding of the findings of todays visit. We also reviewed her medications today and discussed drug interactions and side effects including but not limited excessive drowsiness and  altered mental states. We also discussed that there is always a risk not just to her but also people around her. she has been encouraged to call the office with any questions or concerns that should arise related to todays visit.  Orders Placed This Encounter  Procedures  . Spirometry with Graph    Order Specific Question:   Where should this test be performed?    Answer:   [facility]    Order Specific Question:   Basic spirometry    Answer:   Yes     Time spent: 53  I have personally obtained a history, examined the patient, evaluated laboratory and imaging results, formulated the assessment and plan and placed orders. This patient was seen by Casey Burkitt AGNP-C in Collaboration with Dr. Devona Konig as a part of collaborative care agreement.    Allyne Gee, MD [facility] Pulmonary and Critical Care Sleep medicine

## 2020-02-29 NOTE — Patient Instructions (Signed)

## 2020-03-02 NOTE — Addendum Note (Signed)
Addended by: Devona Konig on: 03/02/2020 10:23 AM   Modules accepted: Level of Service

## 2020-03-06 ENCOUNTER — Ambulatory Visit: Payer: Medicare Other | Admitting: Nurse Practitioner

## 2020-03-11 DIAGNOSIS — M810 Age-related osteoporosis without current pathological fracture: Secondary | ICD-10-CM | POA: Diagnosis not present

## 2020-03-11 DIAGNOSIS — G4733 Obstructive sleep apnea (adult) (pediatric): Secondary | ICD-10-CM | POA: Diagnosis not present

## 2020-03-19 DIAGNOSIS — M810 Age-related osteoporosis without current pathological fracture: Secondary | ICD-10-CM | POA: Diagnosis not present

## 2020-03-20 NOTE — Progress Notes (Unsigned)
Scanned in Overnight OX Test

## 2020-03-24 ENCOUNTER — Telehealth: Payer: Self-pay

## 2020-03-24 ENCOUNTER — Ambulatory Visit: Payer: Medicare Other | Attending: Internal Medicine

## 2020-03-24 DIAGNOSIS — Z23 Encounter for immunization: Secondary | ICD-10-CM

## 2020-03-24 NOTE — Progress Notes (Signed)
   Covid-19 Vaccination Clinic  Name:  Sabrina Zamora    MRN: [id number] DOB: 07/22/1930  03/24/2020  [name] was observed post Covid-19 immunization for 15 minutes without incident. She was provided with Vaccine Information Sheet and instruction to access the V-Safe system.   [name] was instructed to call 911 with any severe reactions post vaccine: Marland Kitchen Difficulty breathing  . Swelling of face and throat  . A fast heartbeat  . A bad rash all over body  . Dizziness and weakness

## 2020-03-24 NOTE — Telephone Encounter (Signed)
[facility] CONFIRMED PT APPT 03/26/20

## 2020-03-26 ENCOUNTER — Ambulatory Visit (INDEPENDENT_AMBULATORY_CARE_PROVIDER_SITE_OTHER): Payer: Medicare Other | Admitting: Internal Medicine

## 2020-03-26 ENCOUNTER — Other Ambulatory Visit: Payer: Self-pay

## 2020-03-26 DIAGNOSIS — M810 Age-related osteoporosis without current pathological fracture: Secondary | ICD-10-CM | POA: Diagnosis not present

## 2020-03-26 DIAGNOSIS — R0602 Shortness of breath: Secondary | ICD-10-CM | POA: Diagnosis not present

## 2020-03-26 LAB — PULMONARY FUNCTION TEST

## 2020-03-27 ENCOUNTER — Other Ambulatory Visit: Payer: Self-pay

## 2020-03-27 DIAGNOSIS — L821 Other seborrheic keratosis: Secondary | ICD-10-CM | POA: Diagnosis not present

## 2020-03-27 DIAGNOSIS — D751 Secondary polycythemia: Secondary | ICD-10-CM

## 2020-03-27 DIAGNOSIS — D2272 Melanocytic nevi of left lower limb, including hip: Secondary | ICD-10-CM | POA: Diagnosis not present

## 2020-03-27 DIAGNOSIS — Z85828 Personal history of other malignant neoplasm of skin: Secondary | ICD-10-CM | POA: Diagnosis not present

## 2020-03-27 DIAGNOSIS — D225 Melanocytic nevi of trunk: Secondary | ICD-10-CM | POA: Diagnosis not present

## 2020-03-27 DIAGNOSIS — Z85820 Personal history of malignant melanoma of skin: Secondary | ICD-10-CM | POA: Diagnosis not present

## 2020-03-27 DIAGNOSIS — D2262 Melanocytic nevi of left upper limb, including shoulder: Secondary | ICD-10-CM | POA: Diagnosis not present

## 2020-03-27 DIAGNOSIS — R233 Spontaneous ecchymoses: Secondary | ICD-10-CM | POA: Diagnosis not present

## 2020-03-28 ENCOUNTER — Other Ambulatory Visit: Payer: Self-pay

## 2020-03-28 ENCOUNTER — Inpatient Hospital Stay: Payer: Medicare Other | Attending: Oncology

## 2020-03-28 ENCOUNTER — Encounter: Payer: Self-pay | Admitting: Oncology

## 2020-03-28 ENCOUNTER — Inpatient Hospital Stay: Payer: Medicare Other

## 2020-03-28 ENCOUNTER — Inpatient Hospital Stay (HOSPITAL_BASED_OUTPATIENT_CLINIC_OR_DEPARTMENT_OTHER): Payer: Medicare Other | Admitting: Oncology

## 2020-03-28 ENCOUNTER — Telehealth: Payer: Self-pay | Admitting: Oncology

## 2020-03-28 VITALS — BP 152/82 | HR 72 | Temp 97.3°F | Resp 16 | Wt 100.9 lb

## 2020-03-28 DIAGNOSIS — G4733 Obstructive sleep apnea (adult) (pediatric): Secondary | ICD-10-CM | POA: Diagnosis not present

## 2020-03-28 DIAGNOSIS — D751 Secondary polycythemia: Secondary | ICD-10-CM

## 2020-03-28 DIAGNOSIS — Z79899 Other long term (current) drug therapy: Secondary | ICD-10-CM | POA: Insufficient documentation

## 2020-03-28 LAB — COMPREHENSIVE METABOLIC PANEL
ALT: 24 U/L (ref 0–44)
AST: 30 U/L (ref 15–41)
Albumin: 4 g/dL (ref 3.5–5.0)
Alkaline Phosphatase: 53 U/L (ref 38–126)
Anion gap: 9 (ref 5–15)
BUN: 19 mg/dL (ref 8–23)
CO2: 29 mmol/L (ref 22–32)
Calcium: 9.4 mg/dL (ref 8.9–10.3)
Chloride: 101 mmol/L (ref 98–111)
Creatinine, Ser: 0.65 mg/dL (ref 0.44–1.00)
GFR calc Af Amer: >60 mL/min (ref >60)
GFR calc non Af Amer: >60 mL/min (ref >60)
Glucose, Bld: 103 mg/dL — ABNORMAL HIGH (ref 70–99)
Potassium: 4.8 mmol/L (ref 3.5–5.1)
Sodium: 139 mmol/L (ref 135–145)
Total Bilirubin: 0.7 mg/dL (ref 0.3–1.2)
Total Protein: 7.4 g/dL (ref 6.5–8.1)

## 2020-03-28 LAB — CBC WITH DIFFERENTIAL/PLATELET
Abs Immature Granulocytes: 0.03 10*3/uL (ref 0.00–0.07)
Basophils Absolute: 0.1 10*3/uL (ref 0.0–0.1)
Basophils Relative: 1 %
Eosinophils Absolute: 0.2 10*3/uL (ref 0.0–0.5)
Eosinophils Relative: 3 %
HCT: 47.3 % — ABNORMAL HIGH (ref 36.0–46.0)
Hemoglobin: 16.3 g/dL — ABNORMAL HIGH (ref 12.0–15.0)
Immature Granulocytes: 1 %
Lymphocytes Relative: 28 %
Lymphs Abs: 1.8 10*3/uL (ref 0.7–4.0)
MCH: 32.1 pg (ref 26.0–34.0)
MCHC: 34.5 g/dL (ref 30.0–36.0)
MCV: 93.1 fL (ref 80.0–100.0)
Monocytes Absolute: 0.6 10*3/uL (ref 0.1–1.0)
Monocytes Relative: 10 %
Neutro Abs: 3.7 10*3/uL (ref 1.7–7.7)
Neutrophils Relative %: 57 %
Platelets: 185 10*3/uL (ref 150–400)
RBC: 5.08 MIL/uL (ref 3.87–5.11)
RDW: 13 % (ref 11.5–15.5)
WBC: 6.4 10*3/uL (ref 4.0–10.5)
nRBC: 0 % (ref 0.0–0.2)

## 2020-03-28 NOTE — Telephone Encounter (Signed)
Patient phoned on this date and stated that she would not be able to do a virtual visit. All appts are now scheduled for 05-30-20 in person.

## 2020-03-28 NOTE — Progress Notes (Signed)
Patient denies new problems/concerns today.   °

## 2020-03-28 NOTE — Progress Notes (Signed)
Sabrina Zamora is a 86 y.o. female with a history of secondary polycythemia presents for follow up .   HPI:  The patient was last seen in the medical oncology clinic on 02/04/2017.  At that time, she felt good.  Exam was unremarkable.  Hematocrit was 46.1 and hemoglobin 15.6.  She had mot required a phlebotomy since 04/2015.  Follow-up appointments were spread out.  CBC was to be checked every 6 months.   Patient previously followed up with Dr. Mike Gip.  Patient switched care to me on 11/29/2018. Extensive chart review was performed.  #Secondary polycythemia Patient has a history of sleep apnea she wears oxygen at night but no CPAP mask.  Never smoker. Work-up on 01/11/2012 reviewed and negative Jak 2 V617F mutation, exon 12 mutation. 08/25/2018 CALR negative, JAK 2 E12-15 negative, MPL negative.  Patient has been on phlebotomy program to keep hematocrit less than 48 and then later to keep it less than 50.  #History of melanoma x3.  T1 a melanoma in August 2012. saw Dermatology and had topical chemotherapy treatment to her forehead lesion.    INTERVAL HISTORY Sabrina Zamora is a 86 y.o. female who has above history reviewed by me today presents for follow up visit for management of secondary erythrocytosis. Problems and complaints are listed below: She has been compliant with CPAP machine. She feels well. No new complaints.     Review of Systems  Constitutional: Negative for appetite change, chills, fatigue and fever.  HENT:   Negative for hearing loss and voice change.   Eyes: Negative for eye problems.  Respiratory: Negative for chest tightness and cough.   Cardiovascular: Negative for chest pain.  Gastrointestinal: Negative for abdominal distention, abdominal pain and blood in stool.  Endocrine: Negative for hot flashes.  Genitourinary: Negative for difficulty urinating and frequency.    Musculoskeletal: Negative for arthralgias.  Skin: Negative for itching and rash.  Neurological: Negative for extremity weakness.  Hematological: Negative for adenopathy.  Psychiatric/Behavioral: Negative for confusion.    Past Medical History:  Diagnosis Date  . Asthma   . Hypothyroidism   . Osteoporosis   . Skin cancer 2012, 2016    Past Surgical History:  Procedure Laterality Date  . BREAST BIOPSY Left 2003   neg  . BREAST BIOPSY Right 09/27/2017   12:00 1 cmfn fibroadenomatous change   . BREAST BIOPSY Right 09/17/2017   12:00 3 cmfn benign breast and fibroadipose tissue  . BREAST EXCISIONAL BIOPSY Left 1988   excisional - neg  . CATARACT EXTRACTION W/PHACO Left 09/20/2017   Procedure: CATARACT EXTRACTION PHACO AND INTRAOCULAR LENS PLACEMENT ([place]) LEFT;  Surgeon: Leandrew Koyanagi, MD;  Location: [facility];  Service: Ophthalmology;  Laterality: Left;  . CATARACT EXTRACTION W/PHACO Right 02/15/2018   Procedure: CATARACT EXTRACTION PHACO AND INTRAOCULAR LENS PLACEMENT ([place]) TOPICAL  RIGHT;  Surgeon: Leandrew Koyanagi, MD;  Location: [facility];  Service: Ophthalmology;  Laterality: Right;  prefers early  . CHOLECYSTECTOMY      Family History  Problem Relation Age of Onset  . Clotting disorder Sister   . Breast cancer Sister 46    Social History:  reports that she has never smoked. She has never used smokeless tobacco. She reports that she does not drink alcohol and does not use drugs.  She has never smoked.  The patient is alone today.  Allergies:  Allergies  Allergen Reactions  .  Levofloxacin Rash and Shortness Of Breath  . Penicillins Hives and Rash  . Betaine Rash  . Ciprofloxacin Itching and Rash  . Sulfa Antibiotics Rash    Current Medications: Current Outpatient Medications  Medication Sig Dispense Refill  . amLODipine (NORVASC) 2.5 MG tablet Take 1 to 2 tab po daily for blood pressure 45 tablet 5  . aspirin EC 81 MG tablet Take 81  mg by mouth.    . Cholecalciferol (VITAMIN D PO) Take by mouth daily.     Marland Kitchen conjugated estrogens (PREMARIN) vaginal cream Place 1 Applicatorful vaginally daily. Use pea sized amount M-W-Fr before bedtime 42.5 g 3  . denosumab (PROLIA) 60 MG/ML SOSY injection Inject into the skin.    Marland Kitchen estradiol (ESTRACE) 0.1 MG/GM vaginal cream Place 1 Applicatorful vaginally at bedtime. 42.5 g 12  . Fluticasone-Salmeterol (ADVAIR) 250-50 MCG/DOSE AEPB Inhale 1 puff into the lungs 2 (two) times daily. 60 each 3  . levothyroxine (SYNTHROID) 25 MCG tablet Take 1.5 tab po Daily before breakfast 135 tablet 0  . lovastatin (MEVACOR) 20 MG tablet Take 1 tablet (20 mg total) by mouth at bedtime. 90 tablet 2  . Multiple Vitamin (MULTI-VITAMINS) TABS Take by mouth.    . mupirocin nasal ointment (BACTROBAN) 2 % Place 1 application into the nose at bedtime. 10 g 1  . Probiotic Product (PROBIOTIC DAILY PO) Take by mouth daily.    . Zinc 25 MG TABS Take by mouth daily.     No current facility-administered medications for this visit.    Physical Exam: Blood pressure (!) 152/82, pulse 72, temperature (!) 97.3 F (36.3 C), resp. rate 16, weight 100 lb 14.4 oz (45.8 kg).  Physical Exam Constitutional:      General: She is not in acute distress.    Appearance: She is not diaphoretic.     Comments: Thin built, walks independently.   HENT:     Head: Normocephalic and atraumatic.     Nose: Nose normal.     Mouth/Throat:     Pharynx: No oropharyngeal exudate.  Eyes:     General: No scleral icterus.    Pupils: Pupils are equal, round, and reactive to light.  Cardiovascular:     Rate and Rhythm: Normal rate and regular rhythm.     Heart sounds: No murmur heard.   Pulmonary:     Effort: Pulmonary effort is normal. No respiratory distress.     Breath sounds: No rales.  Chest:     Chest wall: No tenderness.  Abdominal:     General: There is no distension.     Palpations: Abdomen is soft.     Tenderness: There is no  abdominal tenderness.  Musculoskeletal:        General: Normal range of motion.     Cervical back: Normal range of motion and neck supple.  Skin:    General: Skin is warm and dry.     Findings: No erythema.  Neurological:     Mental Status: She is alert and oriented to person, place, and time.     Cranial Nerves: No cranial nerve deficit.     Motor: No abnormal muscle tone.     Coordination: Coordination normal.  Psychiatric:        Mood and Affect: Affect normal.      Appointment on 03/28/2020  Component Date Value Ref Range Status  . Sodium 03/28/2020 139  135 - 145 mmol/L Final  . Potassium 03/28/2020 4.8  3.5 - 5.1 mmol/L  Final  . Chloride 03/28/2020 101  98 - 111 mmol/L Final  . CO2 03/28/2020 29  22 - 32 mmol/L Final  . Glucose, Bld 03/28/2020 103* 70 - 99 mg/dL Final   Glucose reference range applies only to samples taken after fasting for at least 8 hours.  . BUN 03/28/2020 19  8 - 23 mg/dL Final  . Creatinine, Ser 03/28/2020 0.65  0.44 - 1.00 mg/dL Final  . Calcium 03/28/2020 9.4  8.9 - 10.3 mg/dL Final  . Total Protein 03/28/2020 7.4  6.5 - 8.1 g/dL Final  . Albumin 03/28/2020 4.0  3.5 - 5.0 g/dL Final  . AST 03/28/2020 30  15 - 41 U/L Final  . ALT 03/28/2020 24  0 - 44 U/L Final  . Alkaline Phosphatase 03/28/2020 53  38 - 126 U/L Final  . Total Bilirubin 03/28/2020 0.7  0.3 - 1.2 mg/dL Final  . GFR calc non Af Amer 03/28/2020 >60  >60 mL/min Final  . GFR calc Af Amer 03/28/2020 >60  >60 mL/min Final  . Anion gap 03/28/2020 9  5 - 15 Final   Performed at [facility], [address], [place], [place] [address]  . WBC 03/28/2020 6.4  4.0 - 10.5 K/uL Final  . RBC 03/28/2020 5.08  3.87 - 5.11 MIL/uL Final  . Hemoglobin 03/28/2020 16.3* 12.0 - 15.0 g/dL Final  . HCT 03/28/2020 47.3* 36 - 46 % Final  . MCV 03/28/2020 93.1  80.0 - 100.0 fL Final  . MCH 03/28/2020 32.1  26.0 - 34.0 pg Final  . MCHC 03/28/2020 34.5  30.0 - 36.0 g/dL Final  . RDW 03/28/2020 13.0   11.5 - 15.5 % Final  . Platelets 03/28/2020 185  150 - 400 K/uL Final  . nRBC 03/28/2020 0.0  0.0 - 0.2 % Final  . Neutrophils Relative % 03/28/2020 57  % Final  . Neutro Abs 03/28/2020 3.7  1.7 - 7.7 K/uL Final  . Lymphocytes Relative 03/28/2020 28  % Final  . Lymphs Abs 03/28/2020 1.8  0.7 - 4.0 K/uL Final  . Monocytes Relative 03/28/2020 10  % Final  . Monocytes Absolute 03/28/2020 0.6  0 - 1 K/uL Final  . Eosinophils Relative 03/28/2020 3  % Final  . Eosinophils Absolute 03/28/2020 0.2  0 - 0 K/uL Final  . Basophils Relative 03/28/2020 1  % Final  . Basophils Absolute 03/28/2020 0.1  0 - 0 K/uL Final  . Immature Granulocytes 03/28/2020 1  % Final  . Abs Immature Granulocytes 03/28/2020 0.03  0.00 - 0.07 K/uL Final   Performed at [facility], [address], [place], [place] [address]    Assessment:  Sabrina Zamora is a 86 y.o. female present for follow up of erythrocytosis.  1. Erythrocytosis    # secondary erythrocytosis She is now on CPAP for OSA.  Labs are reviewed and discussed with patient. It is unclear that why her hemoglobin continues to rise despite use of CPAP.  Advise patient to have night oxygen level measure to ensure that OSA is adequately treated.  Phlebotomy threshold with HCT >50 .  If night oxygen level is good, need to consider other etiologies,  erythropoietin producing tumor, familiar erythropoitein receptor mutation. Etc.  Consider check x ray, abdomen US. Patient prefers to defer testing for now                      We spent sufficient time to discuss many aspect of  care, questions were answered to patient's satisfaction.   Earlie Server, MD  03/28/2020, 8:30 PM

## 2020-03-30 NOTE — Procedures (Signed)
[place], [address]  DATE OF SERVICE: March 26, 2020  Complete Pulmonary Function Testing Interpretation:  FINDINGS:  The forced vital capacity is mildly decreased the FEV1 is 1.22 L which is 60% of predicted and is mildly decreased.  FEV1 FVC ratio is mildly decreased.  Postbronchodilator there is no significant improvement in the FEV1.  Total lung capacity is mildly decreased.  Residual volume is normal residual volume total lung capacity ratio is increased.  FRC is normal.  DLCO is mildly decreased.  IMPRESSION:  This pulmonary function study is consistent with moderate obstructive lung disease.  There may be a mild restrictive component also clinical correlation is recommended.  Allyne Gee, MD [facility] Pulmonary Critical Care Medicine Sleep Medicine

## 2020-04-01 ENCOUNTER — Telehealth: Payer: Self-pay

## 2020-04-01 NOTE — Telephone Encounter (Signed)
Unable to confirm appt for 04/03/20

## 2020-04-03 ENCOUNTER — Ambulatory Visit (INDEPENDENT_AMBULATORY_CARE_PROVIDER_SITE_OTHER): Payer: Medicare Other | Admitting: Internal Medicine

## 2020-04-03 ENCOUNTER — Encounter: Payer: Self-pay | Admitting: Internal Medicine

## 2020-04-03 ENCOUNTER — Other Ambulatory Visit: Payer: Self-pay

## 2020-04-03 DIAGNOSIS — J4489 Other specified chronic obstructive pulmonary disease: Secondary | ICD-10-CM

## 2020-04-03 DIAGNOSIS — Z7189 Other specified counseling: Secondary | ICD-10-CM

## 2020-04-03 DIAGNOSIS — J449 Chronic obstructive pulmonary disease, unspecified: Secondary | ICD-10-CM

## 2020-04-03 DIAGNOSIS — Z9989 Dependence on other enabling machines and devices: Secondary | ICD-10-CM

## 2020-04-03 DIAGNOSIS — G4733 Obstructive sleep apnea (adult) (pediatric): Secondary | ICD-10-CM

## 2020-04-03 NOTE — Progress Notes (Signed)
[facility] [address] [place], [place] [address]  Pulmonary Sleep Medicine   Office Visit Note  Patient Name: Sabrina Zamora DOB: 22-Aug-1930 MRN [id number]  Date of Service: 04/07/2020  Complaints/HPI: Patient is here for routine pulmonary follow-up She would like to review the results of her recent tests She is followed by oncology for secondary polycythemia/erythrocytosis, her oncologist Dr. Tasia Catchings requested she have an overnight pulse oximetry study to determine potential underlying etiology She does have OSA and wears CPAP nightly, has been on CPAP since 08/2019, reports no complications, denies headaches upon awakening or daytime sleepiness Her overnight pulse oximetry was unrevealing, low SpO2 85% high SpO2 98%, spent 2.1 minutes with SpO2 below 88% (total study time 6 hours 51 minutes)-she does not qualify for overnight supplemental oxygen PFT-moderate obstructive lung disease as well as mild restrictive lung disease, using Advair daily, unable to tolerate twice daily Denies shortness of breath at rest or with exertion, wheezing, congestion, cough, chest pain or pressure  ROS  General: (-) fever, (-) chills, (-) night sweats, (-) weakness Skin: (-) rashes, (-) itching,. Eyes: (-) visual changes, (-) redness, (-) itching. Nose and Sinuses: (-) nasal stuffiness or itchiness, (-) postnasal drip, (-) nosebleeds, (-) sinus trouble. Mouth and Throat: (-) sore throat, (-) hoarseness. Neck: (-) swollen glands, (-) enlarged thyroid, (-) neck pain. Respiratory: - cough, (-) bloody sputum, - shortness of breath, - wheezing. Cardiovascular: - ankle swelling, (-) chest pain. Lymphatic: (-) lymph node enlargement. Neurologic: (-) numbness, (-) tingling. Psychiatric: (-) anxiety, (-) depression   Current Medication: Outpatient Encounter Medications as of 04/03/2020  Medication Sig Note  . amLODipine (NORVASC) 2.5 MG tablet Take 1 to 2 tab po daily for blood pressure   .  aspirin EC 81 MG tablet Take 81 mg by mouth. 02/04/2017: Takes every other day.  . Cholecalciferol (VITAMIN D PO) Take by mouth daily.  08/25/2018: Unsure of dose   . conjugated estrogens (PREMARIN) vaginal cream Place 1 Applicatorful vaginally daily. Use pea sized amount M-W-Fr before bedtime   . denosumab (PROLIA) 60 MG/ML SOSY injection Inject into the skin.   Marland Kitchen estradiol (ESTRACE) 0.1 MG/GM vaginal cream Place 1 Applicatorful vaginally at bedtime.   . Fluticasone-Salmeterol (ADVAIR) 250-50 MCG/DOSE AEPB Inhale 1 puff into the lungs 2 (two) times daily.   Marland Kitchen levothyroxine (SYNTHROID) 25 MCG tablet Take 1.5 tab po Daily before breakfast   . lovastatin (MEVACOR) 20 MG tablet Take 1 tablet (20 mg total) by mouth at bedtime.   . Multiple Vitamin (MULTI-VITAMINS) TABS Take by mouth. 05/06/2015: [facility]ceived from: [facility]  . mupirocin nasal ointment (BACTROBAN) 2 % Place 1 application into the nose at bedtime.   . Probiotic Product (PROBIOTIC DAILY PO) Take by mouth daily.   . Zinc 25 MG TABS Take by mouth daily.    No facility-administered encounter medications on file as of 04/03/2020.    Surgical History: Past Surgical History:  Procedure Laterality Date  . BREAST BIOPSY Left 2003   neg  . BREAST BIOPSY Right 09/27/2017   12:00 1 cmfn fibroadenomatous change   . BREAST BIOPSY Right 09/17/2017   12:00 3 cmfn benign breast and fibroadipose tissue  . BREAST EXCISIONAL BIOPSY Left 1988   excisional - neg  . CATARACT EXTRACTION W/PHACO Left 09/20/2017   Procedure: CATARACT EXTRACTION PHACO AND INTRAOCULAR LENS PLACEMENT ([place]) LEFT;  Surgeon: Leandrew Koyanagi, MD;  Location: [place];  Service: Ophthalmology;  Laterality: Left;  . CATARACT EXTRACTION W/PHACO Right  02/15/2018   Procedure: CATARACT EXTRACTION PHACO AND INTRAOCULAR LENS PLACEMENT ([place]) TOPICAL  RIGHT;  Surgeon: Leandrew Koyanagi, MD;  Location: [place];  Service: Ophthalmology;  Laterality: Right;   prefers early  . CHOLECYSTECTOMY      Medical History: Past Medical History:  Diagnosis Date  . Asthma   . Hypothyroidism   . Osteoporosis   . Skin cancer 2012, 2016    Family History: Family History  Problem Relation Age of Onset  . Clotting disorder Sister   . Breast cancer Sister 44    Social History: Social History   Socioeconomic History  . Marital status: Married    Spouse name: Not on file  . Number of children: Not on file  . Years of education: Not on file  . Highest education level: Not on file  Occupational History  . Not on file  Tobacco Use  . Smoking status: Never Smoker  . Smokeless tobacco: Never Used  Vaping Use  . Vaping Use: Never used  Substance and Sexual Activity  . Alcohol use: Never    Alcohol/week: 0.0 standard drinks  . Drug use: Never  . Sexual activity: Not on file  Other Topics Concern  . Not on file  Social History Narrative  . Not on file   Social Determinants of Health   Financial Resource Strain:   . Difficulty of Paying Living Expenses: Not on file  Food Insecurity:   . Worried About Charity fundraiser in the Last Year: Not on file  . Ran Out of Food in the Last Year: Not on file  Transportation Needs:   . Lack of Transportation (Medical): Not on file  . Lack of Transportation (Non-Medical): Not on file  Physical Activity:   . Days of Exercise per Week: Not on file  . Minutes of Exercise per Session: Not on file  Stress:   . Feeling of Stress : Not on file  Social Connections:   . Frequency of Communication with Friends and Family: Not on file  . Frequency of Social Gatherings with Friends and Family: Not on file  . Attends Religious Services: Not on file  . Active Member of Clubs or Organizations: Not on file  . Attends Archivist Meetings: Not on file  . Marital Status: Not on file  Intimate Partner Violence:   . Fear of Current or Ex-Partner: Not on file  . Emotionally Abused: Not on file  .  Physically Abused: Not on file  . Sexually Abused: Not on file    Vital Signs: Blood pressure (!) 142/84, pulse 81, temperature (!) 97 F (36.1 C), resp. rate 16, height 5' 2.5" (1.588 m), weight 102 lb (46.3 kg), SpO2 99 %.  Examination: General Appearance: The patient is well-developed, well-nourished, and in no distress. Skin: Gross inspection of skin unremarkable. Head: normocephalic, no gross deformities. Eyes: no gross deformities noted. ENT: ears appear grossly normal no exudates. Neck: Supple. No thyromegaly. No LAD. Respiratory: Clear throughout, no wheezing or rhonchi noted. Cardiovascular: Normal S1 and S2 without murmur or rub. Extremities: No cyanosis. pulses are equal. Neurologic: Alert and oriented. No involuntary movements.  LABS: Recent Results (from the past 2160 hour(s))  Urinalysis, Complete     Status: None   Collection Time: 01/16/20  9:42 AM  Result Value Ref Range   Specific Gravity, UA 1.015 1.005 - 1.030   pH, UA 7.0 5.0 - 7.5   Color, UA Yellow Yellow   Appearance Ur Clear Clear  Leukocytes,UA Negative Negative   Protein,UA Negative Negative/Trace   Glucose, UA Negative Negative   Ketones, UA Negative Negative   RBC, UA Negative Negative   Bilirubin, UA Negative Negative   Urobilinogen, Ur 0.2 0.2 - 1.0 mg/dL   Nitrite, UA Negative Negative   Microscopic Examination See below:   Microscopic Examination     Status: None   Collection Time: 01/16/20  9:42 AM   Urine  Result Value Ref Range   WBC, UA 0-5 0 - 5 /hpf   RBC 0-2 0 - 2 /hpf   Epithelial Cells (non renal) 0-10 0 - 10 /hpf   Bacteria, UA None seen None seen/Few  Comprehensive metabolic panel     Status: None   Collection Time: 01/28/20 10:29 AM  Result Value Ref Range   Sodium 141 135 - 145 mmol/L   Potassium 5.1 3.5 - 5.1 mmol/L   Chloride 101 98 - 111 mmol/L   CO2 32 22 - 32 mmol/L   Glucose, Bld 99 70 - 99 mg/dL    Comment: Glucose reference range applies only to samples taken  after fasting for at least 8 hours.   BUN 19 8 - 23 mg/dL   Creatinine, Ser 0.76 0.44 - 1.00 mg/dL   Calcium 9.4 8.9 - 10.3 mg/dL   Total Protein 7.3 6.5 - 8.1 g/dL   Albumin 3.9 3.5 - 5.0 g/dL   AST 31 15 - 41 U/L   ALT 26 0 - 44 U/L   Alkaline Phosphatase 58 38 - 126 U/L   Total Bilirubin 0.8 0.3 - 1.2 mg/dL   GFR calc non Af Amer >60 >60 mL/min   GFR calc Af Amer >60 >60 mL/min   Anion gap 8 5 - 15    Comment: Performed at [facility], [address] [place], [place] [address]  Hemoglobin     Status: Abnormal   Collection Time: 01/28/20 10:29 AM  Result Value Ref Range   Hemoglobin 15.5 (H) 12.0 - 15.0 g/dL    Comment: Performed at [facility], [address] [place], [place] [address]  Hematocrit     Status: Abnormal   Collection Time: 01/28/20 10:29 AM  Result Value Ref Range   HCT 46.3 (H) 36 - 46 %    Comment: Performed at [facility], [address] [place], [place] [address]  Urinalysis, Complete     Status: Abnormal   Collection Time: 01/29/20  8:36 AM  Result Value Ref Range   Specific Gravity, UA 1.015 1.005 - 1.030   pH, UA 6.5 5.0 - 7.5   Color, UA Straw Yellow   Appearance Ur Cloudy (A) Clear   Leukocytes,UA 2+ (A) Negative   Protein,UA Negative Negative/Trace   Glucose, UA Negative Negative   Ketones, UA Negative Negative   RBC, UA 2+ (A) Negative   Bilirubin, UA Negative Negative   Urobilinogen, Ur 0.2 0.2 - 1.0 mg/dL   Nitrite, UA Negative Negative   Microscopic Examination See below:   Microscopic Examination     Status: Abnormal   Collection Time: 01/29/20  8:36 AM   Urine  Result Value Ref Range   WBC, UA >30 (A) 0 - 5 /hpf   RBC 3-10 (A) 0 - 2 /hpf   Epithelial Cells (non renal) 0-10 0 - 10 /hpf   Bacteria, UA Many (A) None seen/Few  CULTURE, URINE COMPREHENSIVE     Status: Abnormal   Collection Time: 01/29/20  9:05 AM  Specimen: Urine   UR  Result Value Ref Range   Urine Culture, Comprehensive Final report (A)     Organism ID, Bacteria Proteus mirabilis (A)     Comment: 2,000 Colonies/mL Cefazolin <=4 ug/mL Cefazolin with an MIC <=16 predicts susceptibility to the oral agents cefaclor, cefdinir, cefpodoxime, cefprozil, cefuroxime, cephalexin, and loracarbef when used for therapy of uncomplicated urinary tract infections due to E. coli, Klebsiella pneumoniae, and Proteus mirabilis.    Organism ID, Bacteria Citrobacter koseri (A)     Comment: Greater than 100,000 colony forming units per mL   ANTIMICROBIAL SUSCEPTIBILITY Comment     Comment:       ** S = Susceptible; I = Intermediate; R = Resistant **                    P = Positive; N = Negative             MICS are expressed in micrograms per mL    Antibiotic                 RSLT#1    RSLT#2    RSLT#3    RSLT#4 Amoxicillin/Clavulanic Acid    S         S Ampicillin                     S Cefepime                       S         S Ceftriaxone                    S         S Cefuroxime                     S         I Ciprofloxacin                  S         S Ertapenem                      S         S Gentamicin                     S         S Imipenem                                 S Levofloxacin                   S         S Meropenem                      S         S Nitrofurantoin                 R         S Piperacillin/Tazobactam        S         S Tetracycline                   R         S Tobramycin  S         S Trimethoprim/Sulfa             S         S   Urinalysis, Complete     Status: Abnormal   Collection Time: 02/05/20  1:09 PM  Result Value Ref Range   Specific Gravity, UA 1.010 1.005 - 1.030   pH, UA 7.0 5.0 - 7.5   Color, UA Straw Yellow   Appearance Ur Hazy (A) Clear   Leukocytes,UA Trace (A) Negative   Protein,UA Negative Negative/Trace   Glucose, UA Negative Negative   Ketones, UA Negative Negative   RBC, UA Trace (A) Negative   Bilirubin, UA Negative Negative   Urobilinogen, Ur 0.2 0.2 - 1.0  mg/dL   Nitrite, UA Negative Negative   Microscopic Examination See below:   Microscopic Examination     Status: Abnormal   Collection Time: 02/05/20  1:09 PM   Urine  Result Value Ref Range   WBC, UA 0-5 0 - 5 /hpf   RBC 0-2 0 - 2 /hpf   Epithelial Cells (non renal) 0-10 0 - 10 /hpf   Renal Epithel, UA 0-10 (A) None seen /hpf   Bacteria, UA None seen None seen/Few  Urinalysis, Complete     Status: Abnormal   Collection Time: 02/20/20  2:22 PM  Result Value Ref Range   Specific Gravity, UA 1.015 1.005 - 1.030   pH, UA 7.0 5.0 - 7.5   Color, UA Yellow Yellow   Appearance Ur Clear Clear   Leukocytes,UA Trace (A) Negative   Protein,UA Negative Negative/Trace   Glucose, UA Negative Negative   Ketones, UA Negative Negative   RBC, UA Trace (A) Negative   Bilirubin, UA Negative Negative   Urobilinogen, Ur 0.2 0.2 - 1.0 mg/dL   Nitrite, UA Negative Negative   Microscopic Examination See below:   Microscopic Examination     Status: Abnormal   Collection Time: 02/20/20  2:22 PM   Urine  Result Value Ref Range   WBC, UA 0-5 0 - 5 /hpf   RBC 0-2 0 - 2 /hpf   Epithelial Cells (non renal) 0-10 0 - 10 /hpf   Renal Epithel, UA 0-10 (A) None seen /hpf   Bacteria, UA None seen None seen/Few  Hematocrit     Status: Abnormal   Collection Time: 02/25/20 10:52 AM  Result Value Ref Range   HCT 47.3 (H) 36 - 46 %    Comment: Performed at [facility], [address] [place], [place] [address]  Hemoglobin     Status: Abnormal   Collection Time: 02/25/20 10:52 AM  Result Value Ref Range   Hemoglobin 15.7 (H) 12.0 - 15.0 g/dL    Comment: Performed at [facility], [address] [place], [place] [address]  Pulmonary function test     Status: None   Collection Time: 03/26/20  9:30 AM  Result Value Ref Range   FEV1     FVC     FEV1/FVC     TLC     DLCO    Comprehensive metabolic panel     Status: Abnormal   Collection Time: 03/28/20 12:52 PM  Result Value Ref Range    Sodium 139 135 - 145 mmol/L   Potassium 4.8 3.5 - 5.1 mmol/L   Chloride 101 98 - 111 mmol/L   CO2 29 22 - 32 mmol/L   Glucose, Bld 103 (H) 70 - 99 mg/dL  Comment: Glucose reference range applies only to samples taken after fasting for at least 8 hours.   BUN 19 8 - 23 mg/dL   Creatinine, Ser 0.65 0.44 - 1.00 mg/dL   Calcium 9.4 8.9 - 10.3 mg/dL   Total Protein 7.4 6.5 - 8.1 g/dL   Albumin 4.0 3.5 - 5.0 g/dL   AST 30 15 - 41 U/L   ALT 24 0 - 44 U/L   Alkaline Phosphatase 53 38 - 126 U/L   Total Bilirubin 0.7 0.3 - 1.2 mg/dL   GFR calc non Af Amer >60 >60 mL/min   GFR calc Af Amer >60 >60 mL/min   Anion gap 9 5 - 15    Comment: Performed at [facility], [address] [place], [place] [address]  CBC with Differential/Platelet     Status: Abnormal   Collection Time: 03/28/20 12:52 PM  Result Value Ref Range   WBC 6.4 4.0 - 10.5 K/uL   RBC 5.08 3.87 - 5.11 MIL/uL   Hemoglobin 16.3 (H) 12.0 - 15.0 g/dL   HCT 47.3 (H) 36 - 46 %   MCV 93.1 80.0 - 100.0 fL   MCH 32.1 26.0 - 34.0 pg   MCHC 34.5 30.0 - 36.0 g/dL   RDW 13.0 11.5 - 15.5 %   Platelets 185 150 - 400 K/uL   nRBC 0.0 0.0 - 0.2 %   Neutrophils Relative % 57 %   Neutro Abs 3.7 1.7 - 7.7 K/uL   Lymphocytes Relative 28 %   Lymphs Abs 1.8 0.7 - 4.0 K/uL   Monocytes Relative 10 %   Monocytes Absolute 0.6 0 - 1 K/uL   Eosinophils Relative 3 %   Eosinophils Absolute 0.2 0 - 0 K/uL   Basophils Relative 1 %   Basophils Absolute 0.1 0 - 0 K/uL   Immature Granulocytes 1 %   Abs Immature Granulocytes 0.03 0.00 - 0.07 K/uL    Comment: Performed at [facility], [address] [place], [place] [address]    Radiology: MM 3D SCREEN BREAST BILATERAL  Result Date: 09/24/2019 CLINICAL DATA:  Screening. EXAM: DIGITAL SCREENING BILATERAL MAMMOGRAM WITH TOMO AND CAD COMPARISON:  Previous exam(s). ACR Breast Density Category c: The breast tissue is heterogeneously dense, which may obscure small masses. FINDINGS: There  are no findings suspicious for malignancy. Images were processed with CAD. IMPRESSION: No mammographic evidence of malignancy. A result letter of this screening mammogram will be mailed directly to the patient. RECOMMENDATION: Screening mammogram in one year. (Code:SM-B-01Y) BI-RADS CATEGORY  1: Negative. Electronically Signed   By: Ammie Ferrier M.D.   On: 09/24/2019 15:01    No results found.  No results found.    Assessment and Plan: Patient Active Problem List   Diagnosis Date Noted  . Encounter for general adult medical examination with abnormal findings 05/26/2019  . Palpitations 05/26/2019  . Screening for colon cancer 05/26/2019  . Cholecystitis 12/28/2018  . Pancreatitis 12/28/2018  . Oxygen dependent 12/28/2018  . Obstructive chronic bronchitis without exacerbation ([place]) 12/28/2018  . Screening for breast cancer 05/04/2018  . Family history of coronary artery disease 05/04/2018  . Vitamin D deficiency 05/04/2018  . Anxiety 10/31/2017  . Fibrocystic breast disease 10/31/2017  . History of melanoma 10/31/2017  . Sleep apnea 10/31/2017  . Hypothyroidism 10/31/2017  . Essential hypertension, benign 10/31/2017  . Urinary tract infection without hematuria 10/31/2017  . Dysuria 10/31/2017  . Erythrocytosis 05/06/2015  . Melanoma of trunk ([employer]) 02/05/2015  .  Osteoporosis 02/19/2014    1. Obstructive chronic bronchitis without exacerbation ([place]) Continue with Advair once daily as her symptoms are stable at this current time on this therapy, will continue with routine monitoring with spirometry and PFT as indicated.  2. OSA on CPAP Continue with nightly compliance with CPAP. Overnight pulse oximetry unremarkable, does not qualify for nocturnal supplemental oxygen at this time Will have set up to have CPAP download before next follow-up  3. CPAP use counseling Discussed importance of adequate CPAP use time as well as proper care and cleaning techniques for CPAP and all  supplies.  Per her request, will fax results of overnight pulse oximetry as well as PFT to her oncologist, Dr. Tasia Catchings.  General Counseling: I have discussed the findings of the evaluation and examination with Sabrina Zamora.  I have also discussed any further diagnostic evaluation thatmay be needed or ordered today. Sabrina Zamora verbalizes understanding of the findings of todays visit. We also reviewed her medications today and discussed drug interactions and side effects including but not limited excessive drowsiness and altered mental states. We also discussed that there is always a risk not just to her but also people around her. she has been encouraged to call the office with any questions or concerns that should arise related to todays visit.     Time spent: 30  I have personally obtained a history, examined the patient, evaluated laboratory and imaging results, formulated the assessment and plan and placed orders. This patient was seen by Casey Burkitt AGNP-C in Collaboration with Dr. Devona Konig as a part of collaborative care agreement.    Allyne Gee, MD [facility] Pulmonary and Critical Care Sleep medicine

## 2020-04-07 ENCOUNTER — Encounter: Payer: Self-pay | Admitting: Internal Medicine

## 2020-04-07 NOTE — Patient Instructions (Signed)

## 2020-04-10 ENCOUNTER — Other Ambulatory Visit: Payer: Self-pay

## 2020-04-10 MED ORDER — LEVOTHYROXINE SODIUM 25 MCG PO TABS: ORAL_TABLET | ORAL | 0 refills | Status: DC

## 2020-04-16 DIAGNOSIS — Z23 Encounter for immunization: Secondary | ICD-10-CM | POA: Diagnosis not present

## 2020-05-09 ENCOUNTER — Ambulatory Visit (INDEPENDENT_AMBULATORY_CARE_PROVIDER_SITE_OTHER): Payer: Medicare Other | Admitting: Nurse Practitioner

## 2020-05-09 ENCOUNTER — Other Ambulatory Visit: Payer: Self-pay

## 2020-05-09 ENCOUNTER — Encounter: Payer: Self-pay | Admitting: Nurse Practitioner

## 2020-05-09 VITALS — BP 142/80 | HR 69 | Temp 97.5°F | Resp 16 | Ht 62.5 in | Wt 103.4 lb

## 2020-05-09 DIAGNOSIS — J449 Chronic obstructive pulmonary disease, unspecified: Secondary | ICD-10-CM | POA: Diagnosis not present

## 2020-05-09 DIAGNOSIS — D751 Secondary polycythemia: Secondary | ICD-10-CM | POA: Diagnosis not present

## 2020-05-09 DIAGNOSIS — I10 Essential (primary) hypertension: Secondary | ICD-10-CM

## 2020-05-09 DIAGNOSIS — R0602 Shortness of breath: Secondary | ICD-10-CM | POA: Diagnosis not present

## 2020-05-09 DIAGNOSIS — G4733 Obstructive sleep apnea (adult) (pediatric): Secondary | ICD-10-CM | POA: Diagnosis not present

## 2020-05-09 DIAGNOSIS — R1900 Intra-abdominal and pelvic swelling, mass and lump, unspecified site: Secondary | ICD-10-CM

## 2020-05-09 DIAGNOSIS — Z0001 Encounter for general adult medical examination with abnormal findings: Secondary | ICD-10-CM | POA: Diagnosis not present

## 2020-05-09 MED ORDER — FLUTICASONE-SALMETEROL 250-50 MCG/DOSE IN AEPB: 1.0000 | INHALATION_SPRAY | Freq: Two times a day (BID) | RESPIRATORY_TRACT | 3 refills | Status: DC

## 2020-05-09 NOTE — Progress Notes (Signed)
[facility] [place], [place] [address]  Internal MEDICINE  Office Visit Note  Patient Name: Sabrina Zamora  [id number]  [id number]  Date of Service: 06/01/2020   Pt is here for routine health maintenance examination  Chief Complaint  Patient presents with  . Medicare Wellness  . Quality Metric Gaps    flu,tetnaus     The patient is here for health maintenance exam. She had MOHs surgery to remove melanoma from left axillary and trunk region. Melanoma was successfully removed. She has recovered well from procedure. This is fifth melanoma she has had to have removed. She has had cystoscopy which yielded good results. She recently had prolia injection and new bone density test. There was little significant improvement in the lumbar spine  She does have history of COPD and erythrocytosis. She denies chest pain, chest pressure, or shortness of breath which is unusual. Continues to see hematology for erythrocytosis.     Current Medication: Outpatient Encounter Medications as of 05/09/2020  Medication Sig Note  . amLODipine (NORVASC) 2.5 MG tablet Take 1 to 2 tab po daily for blood pressure   . aspirin EC 81 MG tablet Take 81 mg by mouth. 02/04/2017: Takes every other day.  . Cholecalciferol (VITAMIN D PO) Take by mouth daily.  08/25/2018: Unsure of dose   . conjugated estrogens (PREMARIN) vaginal cream Place 1 Applicatorful vaginally daily. Use pea sized amount M-W-Fr before bedtime   . denosumab (PROLIA) 60 MG/ML SOSY injection Inject into the skin.   Marland Kitchen estradiol (ESTRACE) 0.1 MG/GM vaginal cream Place 1 Applicatorful vaginally at bedtime.   . Fluticasone-Salmeterol (ADVAIR) 250-50 MCG/DOSE AEPB Inhale 1 puff into the lungs 2 (two) times daily.   Marland Kitchen levothyroxine (SYNTHROID) 25 MCG tablet Take 1.5 tab po Daily before breakfast   . lovastatin (MEVACOR) 20 MG tablet Take 1 tablet (20 mg total) by mouth at bedtime.   . Multiple Vitamin (MULTI-VITAMINS) TABS Take  by mouth. 05/06/2015: Received from: [facility]  . mupirocin nasal ointment (BACTROBAN) 2 % Place 1 application into the nose at bedtime. (Patient not taking: Reported on 05/30/2020)   . Probiotic Product (PROBIOTIC DAILY PO) Take by mouth daily.   . Zinc 25 MG TABS Take by mouth daily.   . [EXPIRED] cefUROXime (CEFTIN) 250 MG tablet Take 1 tablet (250 mg total) by mouth 2 (two) times daily with a meal for 5 days.   . [DISCONTINUED] Fluticasone-Salmeterol (ADVAIR) 250-50 MCG/DOSE AEPB Inhale 1 puff into the lungs 2 (two) times daily.   . [DISCONTINUED] levothyroxine (SYNTHROID) 25 MCG tablet Take 1.5 tab po Daily before breakfast   . [DISCONTINUED] lovastatin (MEVACOR) 20 MG tablet Take 1 tablet (20 mg total) by mouth at bedtime.    No facility-administered encounter medications on file as of 05/09/2020.    Surgical History: Past Surgical History:  Procedure Laterality Date  . BREAST BIOPSY Left 2003   neg  . BREAST BIOPSY Right 09/27/2017   12:00 1 cmfn fibroadenomatous change   . BREAST BIOPSY Right 09/17/2017   12:00 3 cmfn benign breast and fibroadipose tissue  . BREAST EXCISIONAL BIOPSY Left 1988   excisional - neg  . CATARACT EXTRACTION W/PHACO Left 09/20/2017   Procedure: CATARACT EXTRACTION PHACO AND INTRAOCULAR LENS PLACEMENT ([place]) LEFT;  Surgeon: Leandrew Koyanagi, MD;  Location: [place];  Service: Ophthalmology;  Laterality: Left;  . CATARACT EXTRACTION W/PHACO Right 02/15/2018   Procedure: CATARACT EXTRACTION PHACO AND INTRAOCULAR LENS PLACEMENT (I[facility]) TOPICAL  RIGHT;  Surgeon: Leandrew Koyanagi, MD;  Location: [place];  Service: Ophthalmology;  Laterality: Right;  prefers early  . CHOLECYSTECTOMY      Medical History: Past Medical History:  Diagnosis Date  . Asthma   . Hypothyroidism   . Osteoporosis   . Skin cancer 2012, 2016    Family History: Family History  Problem Relation Age of Onset  . Clotting disorder Sister   . Breast  cancer Sister 44      Review of Systems  Constitutional: Negative for activity change, chills, fatigue and unexpected weight change.  HENT: Negative for congestion, postnasal drip, rhinorrhea, sneezing and sore throat.   Respiratory: Negative for cough, chest tightness, shortness of breath and wheezing.   Cardiovascular: Negative for chest pain and palpitations.  Gastrointestinal: Negative for abdominal pain, constipation, diarrhea, nausea and vomiting.       Small amount of abdomoinal swelling has been noted over past few weeks.   Endocrine: Negative for cold intolerance, heat intolerance, polydipsia and polyuria.  Genitourinary: Negative for dysuria, frequency and urgency.  Musculoskeletal: Positive for back pain. Negative for arthralgias, joint swelling and neck pain.  Skin: Negative for rash.  Allergic/Immunologic: Negative for environmental allergies.  Neurological: Negative for dizziness, tremors, numbness and headaches.  Hematological: Negative for adenopathy. Does not bruise/bleed easily.  Psychiatric/Behavioral: Negative for behavioral problems (Depression), sleep disturbance and suicidal ideas. The patient is not nervous/anxious.      Today's Vitals   05/09/20 1143  BP: (!) 142/80  Pulse: 69  Resp: 16  Temp: (!) 97.5 F (36.4 C)  SpO2: 98%  Weight: 103 lb 6.4 oz (46.9 kg)  Height: 5' 2.5" (1.588 m)   Body mass index is 18.61 kg/m.  Physical Exam Vitals and nursing note reviewed.  Constitutional:      General: She is not in acute distress.    Appearance: Normal appearance. She is well-developed. She is not diaphoretic.  HENT:     Head: Normocephalic and atraumatic.     Nose: Nose normal.     Mouth/Throat:     Pharynx: No oropharyngeal exudate.  Eyes:     Pupils: Pupils are equal, round, and reactive to light.  Neck:     Thyroid: No thyromegaly.     Vascular: No carotid bruit or JVD.     Trachea: No tracheal deviation.  Cardiovascular:     Rate and  Rhythm: Normal rate and regular rhythm.     Pulses: Normal pulses.     Heart sounds: Normal heart sounds. No murmur heard.  No friction rub. No gallop.   Pulmonary:     Effort: Pulmonary effort is normal. No respiratory distress.     Breath sounds: Normal breath sounds. No wheezing or rales.  Chest:     Chest wall: No tenderness.  Abdominal:     General: Bowel sounds are normal.     Palpations: Abdomen is soft.     Comments: Mild generalized swelling with mild tenderness present with palpation of the abdomen.   Musculoskeletal:        General: Normal range of motion.     Cervical back: Normal range of motion and neck supple.  Lymphadenopathy:     Cervical: No cervical adenopathy.  Skin:    General: Skin is warm and dry.     Capillary Refill: Capillary refill takes less than 2 seconds.  Neurological:     General: No focal deficit present.     Mental Status: She is alert and  oriented to person, place, and time.     Cranial Nerves: No cranial nerve deficit.  Psychiatric:        Mood and Affect: Mood normal.        Behavior: Behavior normal.        Thought Content: Thought content normal.        Judgment: Judgment normal.    Depression screen [facility] 2/9 05/09/2020 02/28/2020 09/25/2019 08/29/2019 05/08/2019  Decreased Interest 0 0 0 0 0  Down, Depressed, Hopeless 0 0 0 0 0  PHQ - 2 Score 0 0 0 0 0    Functional Status Survey: Is the patient deaf or have difficulty hearing?: No Does the patient have difficulty seeing, even when wearing glasses/contacts?: No Does the patient have difficulty concentrating, remembering, or making decisions?: No Does the patient have difficulty walking or climbing stairs?: No Does the patient have difficulty dressing or bathing?: No Does the patient have difficulty doing errands alone such as visiting a doctor's office or shopping?: No  MMSE - [place] Exam 05/09/2020 05/08/2019 05/04/2018  Orientation to time _0 Orientation to Place _1 Registration _2 Attention/ Calculation _3 Recall _4 Language- name 2 objects _5 Language- repeat _6 Language- follow 3 step command _7 Language- read & follow direction _8 Write a sentence _9 Copy design _10 Total score _11 Fall Risk  05/09/2020 02/28/2020 09/25/2019 08/29/2019 05/08/2019  Falls in the past year? 0 0 0 0 0  Comment - - - - -     LABS: Recent Results (from the past 2160 hour(s))  Pulmonary function test     Status: None   Collection Time: 03/26/20  9:30 AM  Result Value Ref Range   FEV1     FVC     FEV1/FVC     TLC     DLCO    Comprehensive metabolic panel     Status: Abnormal   Collection Time: 03/28/20 12:52 PM  Result Value Ref Range   Sodium 139 135 - 145 mmol/L   Potassium 4.8 3.5 - 5.1 mmol/L   Chloride 101 98 - 111 mmol/L   CO2 29 22 - 32 mmol/L   Glucose, Bld 103 (H) 70 - 99 mg/dL    Comment: Glucose reference range applies only to samples taken after fasting for at least 8 hours.   BUN 19 8 - 23 mg/dL   Creatinine, Ser 0.65 0.44 - 1.00 mg/dL   Calcium 9.4 8.9 - 10.3 mg/dL   Total Protein 7.4 6.5 - 8.1 g/dL   Albumin 4.0 3.5 - 5.0 g/dL   AST 30 15 - 41 U/L   ALT 24 0 - 44 U/L   Alkaline Phosphatase 53 38 - 126 U/L   Total Bilirubin 0.7 0.3 - 1.2 mg/dL   GFR calc non Af Amer >60 >60 mL/min   GFR calc Af Amer >60 >60 mL/min   Anion gap 9 5 - 15    Comment: Performed at [facility], [address] [place], [place] [address]  CBC with Differential/Platelet     Status: Abnormal   Collection Time: 03/28/20 12:52 PM  Result Value Ref Range   WBC 6.4 4.0 - 10.5 K/uL   RBC 5.08 3.87 - 5.11 MIL/uL   Hemoglobin 16.3 (H)  12.0 - 15.0 g/dL   HCT 47.3 (H) 36 - 46 %   MCV 93.1 80.0 - 100.0 fL   MCH 32.1 26.0 - 34.0 pg   MCHC 34.5 30.0 - 36.0 g/dL   RDW 13.0 11.5 - 15.5 %   Platelets 185 150 - 400 K/uL   nRBC 0.0 0.0 - 0.2 %   Neutrophils Relative % 57 %   Neutro Abs 3.7 1.7 - 7.7 K/uL    Lymphocytes Relative 28 %   Lymphs Abs 1.8 0.7 - 4.0 K/uL   Monocytes Relative 10 %   Monocytes Absolute 0.6 0.1 - 1.0 K/uL   Eosinophils Relative 3 %   Eosinophils Absolute 0.2 0.0 - 0.5 K/uL   Basophils Relative 1 %   Basophils Absolute 0.1 0.0 - 0.1 K/uL   Immature Granulocytes 1 %   Abs Immature Granulocytes 0.03 0.00 - 0.07 K/uL    Comment: Performed at [facility], [address] [place], [place] [address]  Comprehensive metabolic panel     Status: None   Collection Time: 05/13/20  7:56 AM  Result Value Ref Range   Glucose 76 65 - 99 mg/dL   BUN 16 8 - 27 mg/dL   Creatinine, Ser 0.71 0.57 - 1.00 mg/dL   GFR calc non Af Amer 77 >59 mL/min/1.73   GFR calc Af Amer 89 >59 mL/min/1.73    Comment: **In accordance with recommendations from the NKF-ASN Task force,**   [facility] is in the process of updating its eGFR calculation to the   2021 CKD-EPI creatinine equation that estimates kidney function   without a race variable.    BUN/Creatinine Ratio 23 12 - 28   Sodium 141 134 - 144 mmol/L   Potassium 4.5 3.5 - 5.2 mmol/L   Chloride 100 96 - 106 mmol/L   CO2 28 20 - 29 mmol/L   Calcium 10.0 8.7 - 10.3 mg/dL   Total Protein 7.0 6.0 - 8.5 g/dL   Albumin 4.4 3.6 - 4.6 g/dL   Globulin, Total 2.6 1.5 - 4.5 g/dL   Albumin/Globulin Ratio 1.7 1.2 - 2.2   Bilirubin Total 0.8 0.0 - 1.2 mg/dL   Alkaline Phosphatase 57 44 - 121 IU/L    Comment:               **Please note reference interval change**   AST 34 0 - 40 IU/L   ALT 24 0 - 32 IU/L  CBC     Status: Abnormal   Collection Time: 05/13/20  7:56 AM  Result Value Ref Range   WBC 5.5 3.4 - 10.8 x10E3/uL   RBC 5.53 (H) 3.77 - 5.28 x10E6/uL   Hemoglobin 16.9 (H) 11.1 - 15.9 g/dL   Hematocrit 52.0 (H) 34.0 - 46.6 %   MCV 94 79 - 97 fL   MCH 30.6 26.6 - 33.0 pg   MCHC 32.5 31 - 35 g/dL   RDW 12.4 11.7 - 15.4 %   Platelets 229 150 - 450 x10E3/uL  Lipid Panel With LDL/HDL Ratio     Status: Abnormal   Collection Time: 05/13/20   7:56 AM  Result Value Ref Range   Cholesterol, Total 194 100 - 199 mg/dL   Triglycerides 75 0 - 149 mg/dL   HDL 76 >39 mg/dL   VLDL Cholesterol Cal 14 5 - 40 mg/dL   LDL Chol Calc (NIH) 104 (H) 0 - 99 mg/dL   LDL/HDL Ratio 1.4 0.0 - 3.2 ratio    Comment:  LDL/HDL Ratio                                             Men  Women                               1/2 Avg.Risk  1.0    1.5                                   Avg.Risk  3.6    3.2                                2X Avg.Risk  6.2    5.0                                3X Avg.Risk  8.0    6.1   T4, free     Status: None   Collection Time: 05/13/20  7:56 AM  Result Value Ref Range   Free T4 1.53 0.82 - 1.77 ng/dL  TSH     Status: None   Collection Time: 05/13/20  7:56 AM  Result Value Ref Range   TSH 3.710 0.450 - 4.500 uIU/mL  VITAMIN D 25 Hydroxy (Vit-D Deficiency, Fractures)     Status: None   Collection Time: 05/13/20  7:56 AM  Result Value Ref Range   Vit D, 25-Hydroxy 99.8 30.0 - 100.0 ng/mL    Comment: Vitamin D deficiency has been defined by the [place] practice guideline as a level of serum 25-OH vitamin D less than 20 ng/mL (1,2). The Endocrine Society went on to further define vitamin D insufficiency as a level between 21 and 29 ng/mL (2). 1. IOM (Institute of Medicine). 2010. Dietary reference    intakes for calcium and D. [place]: The    [employer]. 2. Holick MF, Binkley [place], Bischoff-Ferrari HA, et al.    Evaluation, treatment, and prevention of vitamin D    deficiency: an Endocrine Society clinical practice    guideline. JCEM. 2011 Jul; 96(7):1911-30.   CBC with Differential/Platelet     Status: Abnormal   Collection Time: 05/30/20  1:32 PM  Result Value Ref Range   WBC 7.6 4.0 - 10.5 K/uL   RBC 5.43 (H) 3.87 - 5.11 MIL/uL   Hemoglobin 16.6 (H) 12.0 - 15.0 g/dL   HCT 50.8 (H) 36 - 46 %   MCV 93.6 80.0 - 100.0 fL    MCH 30.6 26.0 - 34.0 pg   MCHC 32.7 30.0 - 36.0 g/dL   RDW 13.6 11.5 - 15.5 %   Platelets 219 150 - 400 K/uL   nRBC 0.0 0.0 - 0.2 %   Neutrophils Relative % 59 %   Neutro Abs 4.4 1.7 - 7.7 K/uL   Lymphocytes Relative 31 %   Lymphs Abs 2.4 0.7 - 4.0 K/uL   Monocytes Relative 9 %   Monocytes Absolute 0.7 0.1 - 1.0 K/uL   Eosinophils Relative 0 %   Eosinophils Absolute 0.0 0.0 - 0.5 K/uL   Basophils Relative 1 %   Basophils Absolute 0.1 0.0 -  0.1 K/uL   Immature Granulocytes 0 %   Abs Immature Granulocytes 0.03 0.00 - 0.07 K/uL    Comment: Performed at [facility], [address] [place], [place] [address]    Assessment/Plan: 1. Encounter for general adult medical examination with abnormal findings Annual health maintenance exam today.  2. Abdominal swelling Will get abdominal ultrasound for further evaluation . - US Abdomen Complete; Future  3. Essential hypertension, benign Stable. Continue bp medication as prescribed   4. Obstructive chronic bronchitis without exacerbation ([place]) Continue inhalers as prescribed .  5. Obstructive sleep apnea (adult) (pediatric) Echocardiogram ordered for further evaluation.  - ECHOCARDIOGRAM COMPLETE; Future  6. Shortness of breath Echocardiogram ordered for further evaluation.  - ECHOCARDIOGRAM COMPLETE; Future  7. Erythrocytosis Continue regular visits with hematology as scheduled.   General Counseling: Sabrina Zamora verbalizes understanding of the findings of todays visit and agrees with plan of treatment. I have discussed any further diagnostic evaluation that may be needed or ordered today. We also reviewed her medications today. she has been encouraged to call the office with any questions or concerns that should arise related to todays visit.    Counseling:  This patient was seen by Leretha Pol FNP Collaboration with Dr Lavera Guise as a part of collaborative care agreement  Orders Placed This Encounter  Procedures  .  US Abdomen Complete  . ECHOCARDIOGRAM COMPLETE     Total time spent: 42 Minutes  Time spent includes review of chart, medications, test results, and follow up plan with the patient.     Lavera Guise, MD  Internal Medicine

## 2020-05-13 ENCOUNTER — Other Ambulatory Visit: Payer: Self-pay | Admitting: Nurse Practitioner

## 2020-05-13 DIAGNOSIS — Z0001 Encounter for general adult medical examination with abnormal findings: Secondary | ICD-10-CM | POA: Diagnosis not present

## 2020-05-13 DIAGNOSIS — I10 Essential (primary) hypertension: Secondary | ICD-10-CM | POA: Diagnosis not present

## 2020-05-13 DIAGNOSIS — M810 Age-related osteoporosis without current pathological fracture: Secondary | ICD-10-CM | POA: Diagnosis not present

## 2020-05-13 DIAGNOSIS — E559 Vitamin D deficiency, unspecified: Secondary | ICD-10-CM | POA: Diagnosis not present

## 2020-05-13 DIAGNOSIS — E782 Mixed hyperlipidemia: Secondary | ICD-10-CM | POA: Diagnosis not present

## 2020-05-14 LAB — CBC
Hematocrit: 52 % — ABNORMAL HIGH (ref 34.0–46.6)
Hemoglobin: 16.9 g/dL — ABNORMAL HIGH (ref 11.1–15.9)
MCH: 30.6 pg (ref 26.6–33.0)
MCHC: 32.5 g/dL (ref 31.5–35.7)
MCV: 94 fL (ref 79–97)
Platelets: 229 10*3/uL (ref 150–450)
RBC: 5.53 x10E6/uL — ABNORMAL HIGH (ref 3.77–5.28)
RDW: 12.4 % (ref 11.7–15.4)
WBC: 5.5 10*3/uL (ref 3.4–10.8)

## 2020-05-14 LAB — COMPREHENSIVE METABOLIC PANEL
ALT: 24 IU/L (ref 0–32)
AST: 34 IU/L (ref 0–40)
Albumin/Globulin Ratio: 1.7 (ref 1.2–2.2)
Albumin: 4.4 g/dL (ref 3.6–4.6)
Alkaline Phosphatase: 57 IU/L (ref 44–121)
BUN/Creatinine Ratio: 23 (ref 12–28)
BUN: 16 mg/dL (ref 8–27)
Bilirubin Total: 0.8 mg/dL (ref 0.0–1.2)
CO2: 28 mmol/L (ref 20–29)
Calcium: 10 mg/dL (ref 8.7–10.3)
Chloride: 100 mmol/L (ref 96–106)
Creatinine, Ser: 0.71 mg/dL (ref 0.57–1.00)
GFR calc Af Amer: 89 mL/min/{1.73_m2} (ref >59)
GFR calc non Af Amer: 77 mL/min/{1.73_m2} (ref >59)
Globulin, Total: 2.6 g/dL (ref 1.5–4.5)
Glucose: 76 mg/dL (ref 65–99)
Potassium: 4.5 mmol/L (ref 3.5–5.2)
Sodium: 141 mmol/L (ref 134–144)
Total Protein: 7 g/dL (ref 6.0–8.5)

## 2020-05-14 LAB — LIPID PANEL WITH LDL/HDL RATIO
Cholesterol, Total: 194 mg/dL (ref 100–199)
HDL: 76 mg/dL (ref >39)
LDL Chol Calc (NIH): 104 mg/dL — ABNORMAL HIGH (ref 0–99)
LDL/HDL Ratio: 1.4 ratio (ref 0.0–3.2)
Triglycerides: 75 mg/dL (ref 0–149)
VLDL Cholesterol Cal: 14 mg/dL (ref 5–40)

## 2020-05-14 LAB — T4, FREE: Free T4: 1.53 ng/dL (ref 0.82–1.77)

## 2020-05-14 LAB — VITAMIN D 25 HYDROXY (VIT D DEFICIENCY, FRACTURES): Vit D, 25-Hydroxy: 99.8 ng/mL (ref 30.0–100.0)

## 2020-05-14 LAB — TSH: TSH: 3.71 u[IU]/mL (ref 0.450–4.500)

## 2020-05-26 ENCOUNTER — Telehealth: Payer: Self-pay

## 2020-05-26 NOTE — Telephone Encounter (Signed)
Per Dr. Tasia Catchings: Need to add phlebotomy to her visit. Ok to move up if phlebotomy can be added. Otherwise, keep same.  Stephanie added phlebotomy to 11/19 appt.

## 2020-05-26 NOTE — Telephone Encounter (Signed)
[place] message recevied from [facility]:  pt called and said she had her physical on 11/2 and got her labs back and her hgb was 16.9 and hct 52.0. she wants to know if she needs to be seen sooner than her appt on friday this week 11/19.   Dr. Tasia Catchings, she is scheduled for lab/MD on 11/19.  does she need to be seen sooner than 11/19 appt?  She is also scheduled for labs on 11/19 does she need to have these drawn?

## 2020-05-28 DIAGNOSIS — D3102 Benign neoplasm of left conjunctiva: Secondary | ICD-10-CM | POA: Diagnosis not present

## 2020-05-28 DIAGNOSIS — H01022 Squamous blepharitis right lower eyelid: Secondary | ICD-10-CM | POA: Diagnosis not present

## 2020-05-28 DIAGNOSIS — Z961 Presence of intraocular lens: Secondary | ICD-10-CM | POA: Diagnosis not present

## 2020-05-28 DIAGNOSIS — H524 Presbyopia: Secondary | ICD-10-CM | POA: Diagnosis not present

## 2020-05-28 DIAGNOSIS — H01025 Squamous blepharitis left lower eyelid: Secondary | ICD-10-CM | POA: Diagnosis not present

## 2020-05-28 DIAGNOSIS — H52223 Regular astigmatism, bilateral: Secondary | ICD-10-CM | POA: Diagnosis not present

## 2020-05-28 DIAGNOSIS — H40013 Open angle with borderline findings, low risk, bilateral: Secondary | ICD-10-CM | POA: Diagnosis not present

## 2020-05-28 DIAGNOSIS — H5203 Hypermetropia, bilateral: Secondary | ICD-10-CM | POA: Diagnosis not present

## 2020-05-29 ENCOUNTER — Other Ambulatory Visit: Payer: Medicare Other

## 2020-05-30 ENCOUNTER — Inpatient Hospital Stay: Payer: Medicare Other

## 2020-05-30 ENCOUNTER — Inpatient Hospital Stay: Payer: Medicare Other | Attending: Oncology | Admitting: Oncology

## 2020-05-30 ENCOUNTER — Encounter: Payer: Self-pay | Admitting: Oncology

## 2020-05-30 VITALS — BP 147/84 | HR 76 | Resp 18

## 2020-05-30 VITALS — BP 159/94 | HR 85 | Temp 97.6°F | Resp 16 | Wt 100.2 lb

## 2020-05-30 DIAGNOSIS — D751 Secondary polycythemia: Secondary | ICD-10-CM

## 2020-05-30 DIAGNOSIS — Z79899 Other long term (current) drug therapy: Secondary | ICD-10-CM | POA: Diagnosis not present

## 2020-05-30 DIAGNOSIS — G4733 Obstructive sleep apnea (adult) (pediatric): Secondary | ICD-10-CM | POA: Diagnosis not present

## 2020-05-30 LAB — CBC WITH DIFFERENTIAL/PLATELET
Abs Immature Granulocytes: 0.03 10*3/uL (ref 0.00–0.07)
Basophils Absolute: 0.1 10*3/uL (ref 0.0–0.1)
Basophils Relative: 1 %
Eosinophils Absolute: 0 10*3/uL (ref 0.0–0.5)
Eosinophils Relative: 0 %
HCT: 50.8 % — ABNORMAL HIGH (ref 36.0–46.0)
Hemoglobin: 16.6 g/dL — ABNORMAL HIGH (ref 12.0–15.0)
Immature Granulocytes: 0 %
Lymphocytes Relative: 31 %
Lymphs Abs: 2.4 10*3/uL (ref 0.7–4.0)
MCH: 30.6 pg (ref 26.0–34.0)
MCHC: 32.7 g/dL (ref 30.0–36.0)
MCV: 93.6 fL (ref 80.0–100.0)
Monocytes Absolute: 0.7 10*3/uL (ref 0.1–1.0)
Monocytes Relative: 9 %
Neutro Abs: 4.4 10*3/uL (ref 1.7–7.7)
Neutrophils Relative %: 59 %
Platelets: 219 10*3/uL (ref 150–400)
RBC: 5.43 MIL/uL — ABNORMAL HIGH (ref 3.87–5.11)
RDW: 13.6 % (ref 11.5–15.5)
WBC: 7.6 10*3/uL (ref 4.0–10.5)
nRBC: 0 % (ref 0.0–0.2)

## 2020-05-30 NOTE — Progress Notes (Signed)
Patient denies new problems/concerns today.   °

## 2020-05-30 NOTE — Progress Notes (Signed)
[facility] day:  05/30/20  Chief Complaint: Sabrina Zamora is a 86 y.o. female with a history of secondary polycythemia presents for follow up .   HPI:  The patient was last seen in the medical oncology clinic on 02/04/2017.  At that time, she felt good.  Exam was unremarkable.  Hematocrit was 46.1 and hemoglobin 15.6.  She had mot required a phlebotomy since 04/2015.  Follow-up appointments were spread out.  CBC was to be checked every 6 months.   Patient previously followed up with Dr. Mike Gip.  Patient switched care to me on 11/29/2018. Extensive chart review was performed.  #Secondary polycythemia Patient has a history of sleep apnea she wears oxygen at night but no CPAP mask.  Never smoker. Work-up on 01/11/2012 reviewed and negative Jak 2 V617F mutation, exon 12 mutation. 08/25/2018 CALR negative, JAK 2 E12-15 negative, MPL negative.  Patient has been on phlebotomy program to keep hematocrit less than 48 and then later to keep it less than 50.  #History of melanoma x3.  T1 a melanoma in August 2012. saw Dermatology and had topical chemotherapy treatment to her forehead lesion.    INTERVAL HISTORY Sabrina Zamora is a 86 y.o. female who has above history reviewed by me today presents for follow up visit for management of secondary erythrocytosis. Problems and complaints are listed below: She has been compliant with CPAP machine. She did nocturnal oxygen testing and she is not qualified for nocturnal oxygen.  Accompanied by her husband.     Review of Systems  Constitutional: Negative for appetite change, chills, fatigue and fever.  HENT:   Negative for hearing loss and voice change.   Eyes: Negative for eye problems.  Respiratory: Negative for chest tightness and cough.   Cardiovascular: Negative for chest pain.  Gastrointestinal: Negative for abdominal distention, abdominal pain and blood in stool.  Endocrine: Negative for hot  flashes.  Genitourinary: Negative for difficulty urinating and frequency.   Musculoskeletal: Negative for arthralgias.  Skin: Negative for itching and rash.  Neurological: Negative for extremity weakness.  Hematological: Negative for adenopathy.  Psychiatric/Behavioral: Negative for confusion.    Past Medical History:  Diagnosis Date  . Asthma   . Hypothyroidism   . Osteoporosis   . Skin cancer 2012, 2016    Past Surgical History:  Procedure Laterality Date  . BREAST BIOPSY Left 2003   neg  . BREAST BIOPSY Right 09/27/2017   12:00 1 cmfn fibroadenomatous change   . BREAST BIOPSY Right 09/17/2017   12:00 3 cmfn benign breast and fibroadipose tissue  . BREAST EXCISIONAL BIOPSY Left 1988   excisional - neg  . CATARACT EXTRACTION W/PHACO Left 09/20/2017   Procedure: CATARACT EXTRACTION PHACO AND INTRAOCULAR LENS PLACEMENT ([place]) LEFT;  Surgeon: Leandrew Koyanagi, MD;  Location: [place] [facility];  Service: Ophthalmology;  Laterality: Left;  . CATARACT EXTRACTION W/PHACO Right 02/15/2018   Procedure: CATARACT EXTRACTION PHACO AND INTRAOCULAR LENS PLACEMENT ([place]) TOPICAL  RIGHT;  Surgeon: Leandrew Koyanagi, MD;  Location: [place];  Service: Ophthalmology;  Laterality: Right;  prefers early  . CHOLECYSTECTOMY      Family History  Problem Relation Age of Onset  . Clotting disorder Sister   . Breast cancer Sister 3    Social History:  reports that she has never smoked. She has never used smokeless tobacco. She reports that she does not drink alcohol and does not use drugs.  She has never smoked.  The  patient is alone today.  Allergies:  Allergies  Allergen Reactions  . Levofloxacin Rash and Shortness Of Breath  . Penicillins Hives and Rash  . Betaine Rash  . Ciprofloxacin Itching and Rash  . Sulfa Antibiotics Rash    Current Medications: Current Outpatient Medications  Medication Sig Dispense Refill  . amLODipine (NORVASC) 2.5 MG tablet Take 1 to 2 tab  po daily for blood pressure 45 tablet 5  . aspirin EC 81 MG tablet Take 81 mg by mouth.    . conjugated estrogens (PREMARIN) vaginal cream Place 1 Applicatorful vaginally daily. Use pea sized amount M-W-Fr before bedtime 42.5 g 3  . denosumab (PROLIA) 60 MG/ML SOSY injection Inject into the skin.    Marland Kitchen estradiol (ESTRACE) 0.1 MG/GM vaginal cream Place 1 Applicatorful vaginally at bedtime. 42.5 g 12  . Fluticasone-Salmeterol (ADVAIR) 250-50 MCG/DOSE AEPB Inhale 1 puff into the lungs 2 (two) times daily. 60 each 3  . levothyroxine (SYNTHROID) 25 MCG tablet Take 1.5 tab po Daily before breakfast 135 tablet 0  . lovastatin (MEVACOR) 20 MG tablet Take 1 tablet (20 mg total) by mouth at bedtime. 90 tablet 2  . Multiple Vitamin (MULTI-VITAMINS) TABS Take by mouth.    . Probiotic Product (PROBIOTIC DAILY PO) Take by mouth daily.    . Zinc 25 MG TABS Take by mouth daily.    . Cholecalciferol (VITAMIN D PO) Take by mouth daily.     . mupirocin nasal ointment (BACTROBAN) 2 % Place 1 application into the nose at bedtime. (Patient not taking: Reported on 05/30/2020) 10 g 1   No current facility-administered medications for this visit.    Physical Exam: Blood pressure (!) 159/94, pulse 85, temperature 97.6 F (36.4 C), resp. rate 16, weight 100 lb 3.2 oz (45.5 kg).  Physical Exam Constitutional:      General: She is not in acute distress.    Appearance: She is not diaphoretic.     Comments: Thin built, walks independently.   HENT:     Head: Normocephalic and atraumatic.     Nose: Nose normal.     Mouth/Throat:     Pharynx: No oropharyngeal exudate.  Eyes:     General: No scleral icterus.    Pupils: Pupils are equal, round, and reactive to light.  Cardiovascular:     Rate and Rhythm: Normal rate and regular rhythm.     Heart sounds: No murmur heard.   Pulmonary:     Effort: Pulmonary effort is normal. No respiratory distress.     Breath sounds: No rales.  Chest:     Chest wall: No  tenderness.  Abdominal:     General: There is no distension.     Palpations: Abdomen is soft.     Tenderness: There is no abdominal tenderness.  Musculoskeletal:        General: Normal range of motion.     Cervical back: Normal range of motion and neck supple.  Skin:    General: Skin is warm and dry.     Findings: No erythema.  Neurological:     Mental Status: She is alert and oriented to person, place, and time.     Cranial Nerves: No cranial nerve deficit.     Motor: No abnormal muscle tone.     Coordination: Coordination normal.  Psychiatric:        Mood and Affect: Affect normal.      Appointment on 05/30/2020  Component Date Value Ref Range Status  . WBC  05/30/2020 7.6  4.0 - 10.5 K/uL Final  . RBC 05/30/2020 5.43* 3.87 - 5.11 MIL/uL Final  . Hemoglobin 05/30/2020 16.6* 12.0 - 15.0 g/dL Final  . HCT 05/30/2020 50.8* 36 - 46 % Final  . MCV 05/30/2020 93.6  80.0 - 100.0 fL Final  . MCH 05/30/2020 30.6  26.0 - 34.0 pg Final  . MCHC 05/30/2020 32.7  30.0 - 36.0 g/dL Final  . RDW 05/30/2020 13.6  11.5 - 15.5 % Final  . Platelets 05/30/2020 219  150 - 400 K/uL Final  . nRBC 05/30/2020 0.0  0.0 - 0.2 % Final  . Neutrophils Relative % 05/30/2020 59  % Final  . Neutro Abs 05/30/2020 4.4  1.7 - 7.7 K/uL Final  . Lymphocytes Relative 05/30/2020 31  % Final  . Lymphs Abs 05/30/2020 2.4  0.7 - 4.0 K/uL Final  . Monocytes Relative 05/30/2020 9  % Final  . Monocytes Absolute 05/30/2020 0.7  0.1 - 1.0 K/uL Final  . Eosinophils Relative 05/30/2020 0  % Final  . Eosinophils Absolute 05/30/2020 0.0  0.0 - 0.5 K/uL Final  . Basophils Relative 05/30/2020 1  % Final  . Basophils Absolute 05/30/2020 0.1  0.0 - 0.1 K/uL Final  . Immature Granulocytes 05/30/2020 0  % Final  . Abs Immature Granulocytes 05/30/2020 0.03  0.00 - 0.07 K/uL Final   Performed at [facility], [address], [place], [place] [address]    Assessment:  CENIYA FOWERS is a 86 y.o. female present for follow  up of erythrocytosis.  1. Erythrocytosis   2. Obstructive sleep apnea    # erythrocytosis, presumed to be secondary to sleep apnea.  Labs are reviewed and discussed with patient. Hct is >50, proceed with phlebotomy.   It is unclear to me why she continues to have erythrocytosis requires phlebotomy despite using CPAP.  She has good night time oxygen level and is not qualified for oxygen at night.  Per patient, she had her CPAP setting checked and was told "everything is fine".  Other etiologies? Recommend pt to check US abdomen and Chest CXR.  erythropoietin producing tumor, familiar erythropoitein receptor mutation. Etc.  Consider bone marrow biopsy. Discussed with patient. She wants to proceed with [place] abd first- this is ordered by her PCP.  CXR she may get it done.                    We spent sufficient time to discuss many aspect of care, questions were answered to patient's satisfaction.   Earlie Server, MD  05/30/2020, 10:59 PM

## 2020-05-30 NOTE — Progress Notes (Signed)
1515- Patient tolerated phlebotomy procedure well. Patient and vital signs stable. Patient discharged to home at this time.

## 2020-06-01 DIAGNOSIS — R1900 Intra-abdominal and pelvic swelling, mass and lump, unspecified site: Secondary | ICD-10-CM | POA: Insufficient documentation

## 2020-06-01 DIAGNOSIS — R0602 Shortness of breath: Secondary | ICD-10-CM | POA: Insufficient documentation

## 2020-06-12 DIAGNOSIS — H52223 Regular astigmatism, bilateral: Secondary | ICD-10-CM | POA: Diagnosis not present

## 2020-06-12 DIAGNOSIS — H524 Presbyopia: Secondary | ICD-10-CM | POA: Diagnosis not present

## 2020-06-12 DIAGNOSIS — D3102 Benign neoplasm of left conjunctiva: Secondary | ICD-10-CM | POA: Diagnosis not present

## 2020-06-12 DIAGNOSIS — H01022 Squamous blepharitis right lower eyelid: Secondary | ICD-10-CM | POA: Diagnosis not present

## 2020-06-12 DIAGNOSIS — Z961 Presence of intraocular lens: Secondary | ICD-10-CM | POA: Diagnosis not present

## 2020-06-12 DIAGNOSIS — H01025 Squamous blepharitis left lower eyelid: Secondary | ICD-10-CM | POA: Diagnosis not present

## 2020-06-12 DIAGNOSIS — H40013 Open angle with borderline findings, low risk, bilateral: Secondary | ICD-10-CM | POA: Diagnosis not present

## 2020-06-12 DIAGNOSIS — H5203 Hypermetropia, bilateral: Secondary | ICD-10-CM | POA: Diagnosis not present

## 2020-06-13 ENCOUNTER — Ambulatory Visit
Admission: RE | Admit: 2020-06-13 | Discharge: 2020-06-13 | Disposition: A | Discharge status: Home / Self Care | Payer: Medicare Other | Source: Ambulatory Visit | Attending: Oncology | Admitting: Oncology

## 2020-06-13 ENCOUNTER — Ambulatory Visit (INDEPENDENT_AMBULATORY_CARE_PROVIDER_SITE_OTHER): Payer: Medicare Other

## 2020-06-13 ENCOUNTER — Ambulatory Visit
Admission: RE | Admit: 2020-06-13 | Discharge: 2020-06-13 | Disposition: A | Discharge status: Home / Self Care | Payer: Medicare Other | Attending: Oncology | Admitting: Oncology

## 2020-06-13 ENCOUNTER — Other Ambulatory Visit: Payer: Self-pay

## 2020-06-13 DIAGNOSIS — D751 Secondary polycythemia: Secondary | ICD-10-CM

## 2020-06-13 DIAGNOSIS — J90 Pleural effusion, not elsewhere classified: Secondary | ICD-10-CM | POA: Diagnosis not present

## 2020-06-13 DIAGNOSIS — R1900 Intra-abdominal and pelvic swelling, mass and lump, unspecified site: Secondary | ICD-10-CM | POA: Diagnosis not present

## 2020-06-13 DIAGNOSIS — J449 Chronic obstructive pulmonary disease, unspecified: Secondary | ICD-10-CM | POA: Diagnosis not present

## 2020-06-17 ENCOUNTER — Telehealth: Payer: Self-pay | Admitting: *Deleted

## 2020-06-17 DIAGNOSIS — R911 Solitary pulmonary nodule: Secondary | ICD-10-CM

## 2020-06-17 DIAGNOSIS — D751 Secondary polycythemia: Secondary | ICD-10-CM

## 2020-06-17 NOTE — Telephone Encounter (Signed)
Mr Lazarus called stating that patient has seen her results in MyChart and would like to speak with someone about it before her appointment on the 17th. Please return his call.[phone]   IMPRESSION: No active cardiopulmonary disease.  COPD.  New 9 mm nodular opacity within the left mid lung zone. Dedicated CT examination is recommended for further evaluation, if clinically indicated.   Electronically Signed   By: Fidela Salisbury MD   On: 06/14/2020 12:35l

## 2020-06-17 NOTE — Telephone Encounter (Signed)
Order entered.  Please schedule and inform patient of appt details.

## 2020-06-17 NOTE — Telephone Encounter (Signed)
Talked to pt. Recommend CT chest wo for further evaluation she agrees.  Team please arrange. She keeps her appt on 12/17, CT prior or after is fine.

## 2020-06-17 NOTE — Telephone Encounter (Signed)
Done... CT has been as requested. Pt was made aware of her sched 07/01/20 CT appt.

## 2020-06-19 NOTE — Telephone Encounter (Signed)
Done.. CT scan was R/S to 12/16 that was the 1st avail date. Pt was made aware

## 2020-06-19 NOTE — Telephone Encounter (Signed)
If CT can't be done sooner than 12/17 (when she see's Dr. Tasia Catchings), then move appts with Dr. Tasia Catchings (lab/MD/phleb) to after CT and notify pt of appts, per MD request

## 2020-06-20 ENCOUNTER — Other Ambulatory Visit: Payer: Self-pay

## 2020-06-20 ENCOUNTER — Ambulatory Visit: Payer: Medicare Other

## 2020-06-20 DIAGNOSIS — R0602 Shortness of breath: Secondary | ICD-10-CM

## 2020-06-20 DIAGNOSIS — G4733 Obstructive sleep apnea (adult) (pediatric): Secondary | ICD-10-CM

## 2020-06-26 ENCOUNTER — Other Ambulatory Visit: Payer: Self-pay

## 2020-06-26 ENCOUNTER — Ambulatory Visit
Admission: RE | Admit: 2020-06-26 | Discharge: 2020-06-26 | Disposition: A | Discharge status: Home / Self Care | Payer: Medicare Other | Source: Ambulatory Visit | Attending: Oncology | Admitting: Oncology

## 2020-06-26 DIAGNOSIS — R911 Solitary pulmonary nodule: Secondary | ICD-10-CM

## 2020-06-26 DIAGNOSIS — D751 Secondary polycythemia: Secondary | ICD-10-CM

## 2020-06-26 DIAGNOSIS — J479 Bronchiectasis, uncomplicated: Secondary | ICD-10-CM | POA: Diagnosis not present

## 2020-06-26 DIAGNOSIS — I700 Atherosclerosis of aorta: Secondary | ICD-10-CM | POA: Diagnosis not present

## 2020-06-26 DIAGNOSIS — I2510 Atherosclerotic heart disease of native coronary artery without angina pectoris: Secondary | ICD-10-CM | POA: Diagnosis not present

## 2020-06-26 DIAGNOSIS — I517 Cardiomegaly: Secondary | ICD-10-CM | POA: Diagnosis not present

## 2020-06-27 ENCOUNTER — Encounter: Payer: Self-pay | Admitting: Oncology

## 2020-06-27 ENCOUNTER — Inpatient Hospital Stay: Payer: Medicare Other | Attending: Oncology | Admitting: Oncology

## 2020-06-27 ENCOUNTER — Inpatient Hospital Stay: Payer: Medicare Other

## 2020-06-27 VITALS — BP 160/85 | HR 90 | Temp 98.5°F | Resp 18 | Wt 101.0 lb

## 2020-06-27 DIAGNOSIS — D751 Secondary polycythemia: Secondary | ICD-10-CM | POA: Diagnosis not present

## 2020-06-27 DIAGNOSIS — Z79899 Other long term (current) drug therapy: Secondary | ICD-10-CM | POA: Diagnosis not present

## 2020-06-27 DIAGNOSIS — R911 Solitary pulmonary nodule: Secondary | ICD-10-CM

## 2020-06-27 LAB — CBC WITH DIFFERENTIAL/PLATELET
Abs Immature Granulocytes: 0.04 10*3/uL (ref 0.00–0.07)
Basophils Absolute: 0.1 10*3/uL (ref 0.0–0.1)
Basophils Relative: 1 %
Eosinophils Absolute: 0.1 10*3/uL (ref 0.0–0.5)
Eosinophils Relative: 1 %
HCT: 46.3 % — ABNORMAL HIGH (ref 36.0–46.0)
Hemoglobin: 15.7 g/dL — ABNORMAL HIGH (ref 12.0–15.0)
Immature Granulocytes: 1 %
Lymphocytes Relative: 30 %
Lymphs Abs: 2.3 10*3/uL (ref 0.7–4.0)
MCH: 32.3 pg (ref 26.0–34.0)
MCHC: 33.9 g/dL (ref 30.0–36.0)
MCV: 95.3 fL (ref 80.0–100.0)
Monocytes Absolute: 0.8 10*3/uL (ref 0.1–1.0)
Monocytes Relative: 10 %
Neutro Abs: 4.3 10*3/uL (ref 1.7–7.7)
Neutrophils Relative %: 57 %
Platelets: 201 10*3/uL (ref 150–400)
RBC: 4.86 MIL/uL (ref 3.87–5.11)
RDW: 13.9 % (ref 11.5–15.5)
WBC: 7.5 10*3/uL (ref 4.0–10.5)
nRBC: 0 % (ref 0.0–0.2)

## 2020-06-27 NOTE — Progress Notes (Signed)
[facility] day:  06/27/20  Chief Complaint: Sabrina Zamora is a 87 y.o. female with a history of secondary polycythemia presents for follow up .   HPI:  The patient was last seen in the medical oncology clinic on 02/04/2017.  At that time, she felt good.  Exam was unremarkable.  Hematocrit was 46.1 and hemoglobin 15.6.  She had mot required a phlebotomy since 04/2015.  Follow-up appointments were spread out.  CBC was to be checked every 6 months.   Patient previously followed up with Dr. Mike Gip.  Patient switched care to me on 11/29/2018. Extensive chart review was performed.  #Secondary polycythemia Patient has a history of sleep apnea she wears oxygen at night but no CPAP mask.  Never smoker. Work-up on 01/11/2012 reviewed and negative Jak 2 V617F mutation, exon 12 mutation. 08/25/2018 CALR negative, JAK 2 E12-15 negative, MPL negative.  Patient has been on phlebotomy program to keep hematocrit less than 48 and then later to keep it less than 50.  #History of melanoma x3.  T1 a melanoma in August 2012. saw Dermatology and had topical chemotherapy treatment to her forehead lesion.   #She has been compliant with CPAP machine. She did nocturnal oxygen testing and she is not qualified for nocturnal oxygen.   INTERVAL HISTORY Sabrina Zamora is a 86 y.o. female who has above history reviewed by me today presents for follow up visit for management of secondary erythrocytosis. Problems and complaints are listed below:  Accompanied by her husband.  During the interval patient had a chest x-ray done which showed a new 9 mm nodular opacity within the left midlung zone. 06/26/2020, CT without contrast showed a partial solid nodule within the superior segment of the left lower pole.  This measures 2.4 cm with a solid component measuring 5 mm.  Bronchiectasis with tree-in-bud nodularity is noted within the anterior and posterior basal portions of the  right upper lobe.  Patient received phlebotomy 4 weeks ago.  Today no new complaints.  She is a never smoker.  Report second hand smoke exposure when she grew up and during the initial few years of her marriage.  Her husband stopped smoking in 1960s.  She denies any unintentional weight loss.   Review of Systems  Constitutional: Negative for appetite change, chills, fatigue and fever.  HENT:   Negative for hearing loss and voice change.   Eyes: Negative for eye problems.  Respiratory: Negative for chest tightness and cough.   Cardiovascular: Negative for chest pain.  Gastrointestinal: Negative for abdominal distention, abdominal pain and blood in stool.  Endocrine: Negative for hot flashes.  Genitourinary: Negative for difficulty urinating and frequency.   Musculoskeletal: Negative for arthralgias.  Skin: Negative for itching and rash.  Neurological: Negative for extremity weakness.  Hematological: Negative for adenopathy.  Psychiatric/Behavioral: Negative for confusion.    Past Medical History:  Diagnosis Date   Asthma    Hypothyroidism    Osteoporosis    Skin cancer 2012, 2016    Past Surgical History:  Procedure Laterality Date   BREAST BIOPSY Left 2003   neg   BREAST BIOPSY Right 09/27/2017   12:00 1 cmfn fibroadenomatous change    BREAST BIOPSY Right 09/17/2017   12:00 3 cmfn benign breast and fibroadipose tissue   BREAST EXCISIONAL BIOPSY Left 1988   excisional - neg   CATARACT EXTRACTION W/PHACO Left 09/20/2017   Procedure: CATARACT EXTRACTION PHACO AND INTRAOCULAR LENS PLACEMENT ([place]) LEFT;  Surgeon: Leandrew Koyanagi, MD;  Location: [place];  Service: Ophthalmology;  Laterality: Left;   CATARACT EXTRACTION W/PHACO Right 02/15/2018   Procedure: CATARACT EXTRACTION PHACO AND INTRAOCULAR LENS PLACEMENT ([place]) TOPICAL  RIGHT;  Surgeon: Leandrew Koyanagi, MD;  Location: [facility];  Service: Ophthalmology;  Laterality: Right;  prefers  early   CHOLECYSTECTOMY      Family History  Problem Relation Age of Onset   Clotting disorder Sister    Breast cancer Sister 59    Social History:  reports that she has never smoked. She has never used smokeless tobacco. She reports that she does not drink alcohol and does not use drugs.  She has never smoked.  The patient is alone today.  Allergies:  Allergies  Allergen Reactions   Levofloxacin Rash and Shortness Of Breath   Penicillins Hives and Rash   Betaine Rash   Ciprofloxacin Itching and Rash   Sulfa Antibiotics Rash    Current Medications: Current Outpatient Medications  Medication Sig Dispense Refill   amLODipine (NORVASC) 2.5 MG tablet Take 1 to 2 tab po daily for blood pressure 45 tablet 5   aspirin EC 81 MG tablet Take 81 mg by mouth.     Cholecalciferol (VITAMIN D PO) Take by mouth daily.      conjugated estrogens (PREMARIN) vaginal cream Place 1 Applicatorful vaginally daily. Use pea sized amount M-W-Fr before bedtime 42.5 g 3   denosumab (PROLIA) 60 MG/ML SOSY injection Inject into the skin.     estradiol (ESTRACE) 0.1 MG/GM vaginal cream Place 1 Applicatorful vaginally at bedtime. 42.5 g 12   Fluticasone-Salmeterol (ADVAIR) 250-50 MCG/DOSE AEPB Inhale 1 puff into the lungs 2 (two) times daily. 60 each 3   levothyroxine (SYNTHROID) 25 MCG tablet Take 1.5 tab po Daily before breakfast 135 tablet 0   lovastatin (MEVACOR) 20 MG tablet Take 1 tablet (20 mg total) by mouth at bedtime. 90 tablet 2   Multiple Vitamin (MULTI-VITAMINS) TABS Take by mouth.     mupirocin nasal ointment (BACTROBAN) 2 % Place 1 application into the nose at bedtime. 10 g 1   Probiotic Product (PROBIOTIC DAILY PO) Take by mouth daily.     Zinc 25 MG TABS Take by mouth daily.     No current facility-administered medications for this visit.    Physical Exam: Blood pressure (!) 160/85, pulse 90, temperature 98.5 F (36.9 C), resp. rate 18, weight 101 lb (45.8 kg).   Physical Exam Constitutional:      General: She is not in acute distress.    Appearance: She is not diaphoretic.     Comments: Thin built, walks independently.   HENT:     Head: Normocephalic and atraumatic.     Nose: Nose normal.     Mouth/Throat:     Pharynx: No oropharyngeal exudate.  Eyes:     General: No scleral icterus.    Pupils: Pupils are equal, round, and reactive to light.  Cardiovascular:     Rate and Rhythm: Normal rate and regular rhythm.     Heart sounds: No murmur heard.   Pulmonary:     Effort: Pulmonary effort is normal. No respiratory distress.     Breath sounds: No rales.  Chest:     Chest wall: No tenderness.  Abdominal:     General: There is no distension.     Palpations: Abdomen is soft.     Tenderness: There is no abdominal tenderness.  Musculoskeletal:  General: Normal range of motion.     Cervical back: Normal range of motion and neck supple.  Skin:    General: Skin is warm and dry.     Findings: No erythema.  Neurological:     Mental Status: She is alert and oriented to person, place, and time.     Cranial Nerves: No cranial nerve deficit.     Motor: No abnormal muscle tone.     Coordination: Coordination normal.  Psychiatric:        Mood and Affect: Affect normal.      Appointment on 06/27/2020  Component Date Value Ref Range Status   WBC 06/27/2020 7.5  4.0 - 10.5 K/uL Final   RBC 06/27/2020 4.86  3.87 - 5.11 MIL/uL Final   Hemoglobin 06/27/2020 15.7* 12.0 - 15.0 g/dL Final   HCT 06/27/2020 46.3* 36.0 - 46.0 % Final   MCV 06/27/2020 95.3  80.0 - 100.0 fL Final   MCH 06/27/2020 32.3  26.0 - 34.0 pg Final   MCHC 06/27/2020 33.9  30.0 - 36.0 g/dL Final   RDW 06/27/2020 13.9  11.5 - 15.5 % Final   Platelets 06/27/2020 201  150 - 400 K/uL Final   nRBC 06/27/2020 0.0  0.0 - 0.2 % Final   Neutrophils Relative % 06/27/2020 57  % Final   Neutro Abs 06/27/2020 4.3  1.7 - 7.7 K/uL Final   Lymphocytes Relative 06/27/2020  30  % Final   Lymphs Abs 06/27/2020 2.3  0.7 - 4.0 K/uL Final   Monocytes Relative 06/27/2020 10  % Final   Monocytes Absolute 06/27/2020 0.8  0.1 - 1.0 K/uL Final   Eosinophils Relative 06/27/2020 1  % Final   Eosinophils Absolute 06/27/2020 0.1  0.0 - 0.5 K/uL Final   Basophils Relative 06/27/2020 1  % Final   Basophils Absolute 06/27/2020 0.1  0.0 - 0.1 K/uL Final   Immature Granulocytes 06/27/2020 1  % Final   Abs Immature Granulocytes 06/27/2020 0.04  0.00 - 0.07 K/uL Final   Performed at [facility], [address], [place], [place] [address]    Assessment:  Sabrina Zamora is a 86 y.o. female present for follow up of erythrocytosis.  1. Lung nodule   2. Erythrocytosis    # erythrocytosis, presumed to be secondary to sleep apnea.  Labs are reviewed with patient and husband. Hematocrit is less than 50.  No need for phlebotomy today. Etiology of erythrocytosis unclear.  She has been compliant with using CPAP machine.  She has good nighttime oxygen level and is not qualified for oxygen at night.  Ultrasound abdomen showed no suspicious findings.  #Lung nodule found on chest x-ray and confirmed on CT scan. Image was reviewed by me and discussed with the patient. There is a very small solid component 5 mm.  Recommend repeat CT scan in 3 to 6 months to ensure stability and persistence.  She is not a high risk patient.  If no change in treatment to 6 months, then annual CT scan for 5 years  I will present her case on tumor board.  Patient will follow up with me in 3 months                 We spent sufficient time to discuss many aspect of care, questions were answered to patient's satisfaction.   Earlie Server, MD  06/27/2020, 10:42 PM

## 2020-06-30 ENCOUNTER — Other Ambulatory Visit: Payer: Self-pay

## 2020-06-30 ENCOUNTER — Encounter: Payer: Self-pay | Admitting: Nurse Practitioner

## 2020-06-30 ENCOUNTER — Ambulatory Visit (INDEPENDENT_AMBULATORY_CARE_PROVIDER_SITE_OTHER): Payer: Medicare Other | Admitting: Nurse Practitioner

## 2020-06-30 VITALS — BP 146/82 | HR 73 | Temp 97.4°F | Resp 16 | Ht 62.5 in | Wt 101.6 lb

## 2020-06-30 DIAGNOSIS — R0602 Shortness of breath: Secondary | ICD-10-CM | POA: Diagnosis not present

## 2020-06-30 DIAGNOSIS — I700 Atherosclerosis of aorta: Secondary | ICD-10-CM | POA: Diagnosis not present

## 2020-06-30 DIAGNOSIS — J449 Chronic obstructive pulmonary disease, unspecified: Secondary | ICD-10-CM | POA: Diagnosis not present

## 2020-06-30 DIAGNOSIS — I10 Essential (primary) hypertension: Secondary | ICD-10-CM

## 2020-06-30 DIAGNOSIS — R1900 Intra-abdominal and pelvic swelling, mass and lump, unspecified site: Secondary | ICD-10-CM

## 2020-06-30 NOTE — Progress Notes (Signed)
[facility] [place], [place] [address]  Internal MEDICINE  Office Visit Note  Patient Name: Sabrina Zamora  [id number]  [id number]  Date of Service: 06/30/2020  Chief Complaint  Patient presents with  . Follow-up    Review [place]  . Asthma    The patient is here for follow up visit.  -had CT chest. Patient had CT chest per Dr. Erlinda Hong. Had two solid nodular densities. One is 2.4 cm in diameter in left lower lobe of the lung. Other is 52mm in diameter in right upper lobe. Bronchiectasis was also noted. She has coronary and aortic atherosclerosis as well. She takes lovastatin and baby aspirin every day -had echocardiogram.she has normal size and function of left ventricle. She has mild aortic and mitral valve regurgitation and moderate tricuspid regurgitation. She also has mild pulmonary hypertension. She is routinely followed by Dr. Devona Konig for OSA and pulmonary issues.  -abdominal ultrasound was unremarkable.  -no new concerns or complaints today       Current Medication: Outpatient Encounter Medications as of 06/30/2020  Medication Sig Note  . amLODipine (NORVASC) 2.5 MG tablet Take 1 to 2 tab po daily for blood pressure   . aspirin EC 81 MG tablet Take 81 mg by mouth. 02/04/2017: Takes every other day.  . Cholecalciferol (VITAMIN D PO) Take by mouth daily.  08/25/2018: Unsure of dose   . conjugated estrogens (PREMARIN) vaginal cream Place 1 Applicatorful vaginally daily. Use pea sized amount M-W-Fr before bedtime   . denosumab (PROLIA) 60 MG/ML SOSY injection Inject into the skin.   Marland Kitchen estradiol (ESTRACE) 0.1 MG/GM vaginal cream Place 1 Applicatorful vaginally at bedtime.   . Fluticasone-Salmeterol (ADVAIR) 250-50 MCG/DOSE AEPB Inhale 1 puff into the lungs 2 (two) times daily.   Marland Kitchen levothyroxine (SYNTHROID) 25 MCG tablet Take 1.5 tab po Daily before breakfast   . lovastatin (MEVACOR) 20 MG tablet Take 1 tablet (20 mg total) by mouth at bedtime.   . Multiple  Vitamin (MULTI-VITAMINS) TABS Take by mouth. 05/06/2015: Received from: [facility]  . mupirocin nasal ointment (BACTROBAN) 2 % Place 1 application into the nose at bedtime.   . Probiotic Product (PROBIOTIC DAILY PO) Take by mouth daily.   . Zinc 25 MG TABS Take by mouth daily.    No facility-administered encounter medications on file as of 06/30/2020.    Surgical History: Past Surgical History:  Procedure Laterality Date  . BREAST BIOPSY Left 2003   neg  . BREAST BIOPSY Right 09/27/2017   12:00 1 cmfn fibroadenomatous change   . BREAST BIOPSY Right 09/17/2017   12:00 3 cmfn benign breast and fibroadipose tissue  . BREAST EXCISIONAL BIOPSY Left 1988   excisional - neg  . CATARACT EXTRACTION W/PHACO Left 09/20/2017   Procedure: CATARACT EXTRACTION PHACO AND INTRAOCULAR LENS PLACEMENT ([place]) LEFT;  Surgeon: Leandrew Koyanagi, MD;  Location: [place];  Service: Ophthalmology;  Laterality: Left;  . CATARACT EXTRACTION W/PHACO Right 02/15/2018   Procedure: CATARACT EXTRACTION PHACO AND INTRAOCULAR LENS PLACEMENT ([place]) TOPICAL  RIGHT;  Surgeon: Leandrew Koyanagi, MD;  Location: [place];  Service: Ophthalmology;  Laterality: Right;  prefers early  . CHOLECYSTECTOMY      Medical History: Past Medical History:  Diagnosis Date  . Asthma   . Hypothyroidism   . Osteoporosis   . Skin cancer 2012, 2016    Family History: Family History  Problem Relation Age of Onset  . Clotting disorder Sister   .  Breast cancer Sister 48    Social History   Socioeconomic History  . Marital status: Married    Spouse name: Not on file  . Number of children: Not on file  . Years of education: Not on file  . Highest education level: Not on file  Occupational History  . Not on file  Tobacco Use  . Smoking status: Never Smoker  . Smokeless tobacco: Never Used  Vaping Use  . Vaping Use: Never used  Substance and Sexual Activity  . Alcohol use: Never    Alcohol/week:  0.0 standard drinks  . Drug use: Never  . Sexual activity: Not on file  Other Topics Concern  . Not on file  Social History Narrative  . Not on file   Social Determinants of Health   Financial Resource Strain: Not on file  Food Insecurity: Not on file  Transportation Needs: Not on file  Physical Activity: Not on file  Stress: Not on file  Social Connections: Not on file  Intimate Partner Violence: Not on file      Review of Systems  Constitutional: Negative for activity change, chills, fatigue and unexpected weight change.  HENT: Negative for congestion, postnasal drip, rhinorrhea, sneezing and sore throat.   Respiratory: Negative for cough, chest tightness, shortness of breath and wheezing.   Cardiovascular: Negative for chest pain and palpitations.  Gastrointestinal: Negative for abdominal pain, constipation, diarrhea, nausea and vomiting.       Small amount of abdomoinal swelling has been noted over past few weeks. Denies pain associated with swelling.   Endocrine: Negative for cold intolerance, heat intolerance, polydipsia and polyuria.  Musculoskeletal: Negative for arthralgias, back pain, joint swelling and neck pain.  Skin: Negative for rash.  Allergic/Immunologic: Negative for environmental allergies.  Neurological: Negative for dizziness, tremors, numbness and headaches.  Hematological: Negative for adenopathy. Does not bruise/bleed easily.  Psychiatric/Behavioral: Negative for behavioral problems (Depression), sleep disturbance and suicidal ideas. The patient is not nervous/anxious.     Today's Vitals   06/30/20 1005  BP: (!) 146/82  Pulse: 73  Resp: 16  Temp: (!) 97.4 F (36.3 C)  SpO2: 99%  Weight: 101 lb 9.6 oz (46.1 kg)  Height: 5' 2.5" (1.588 m)   Body mass index is 18.29 kg/m.  Physical Exam Vitals and nursing note reviewed.  Constitutional:      General: She is not in acute distress.    Appearance: Normal appearance. She is well-developed. She  is not diaphoretic.  HENT:     Head: Normocephalic and atraumatic.     Nose: Nose normal.     Mouth/Throat:     Pharynx: No oropharyngeal exudate.  Eyes:     Pupils: Pupils are equal, round, and reactive to light.  Neck:     Thyroid: No thyromegaly.     Vascular: No carotid bruit or JVD.     Trachea: No tracheal deviation.  Cardiovascular:     Rate and Rhythm: Normal rate and regular rhythm.     Pulses: Normal pulses.     Heart sounds: Normal heart sounds. No murmur heard. No friction rub. No gallop.   Pulmonary:     Effort: Pulmonary effort is normal. No respiratory distress.     Breath sounds: Normal breath sounds. No wheezing or rales.  Chest:     Chest wall: No tenderness.  Abdominal:     General: Bowel sounds are normal.     Palpations: Abdomen is soft.     Tenderness: There is  no abdominal tenderness.  Musculoskeletal:        General: Normal range of motion.     Cervical back: Normal range of motion and neck supple.  Lymphadenopathy:     Cervical: No cervical adenopathy.  Skin:    General: Skin is warm and dry.     Capillary Refill: Capillary refill takes less than 2 seconds.  Neurological:     General: No focal deficit present.     Mental Status: She is alert and oriented to person, place, and time.     Cranial Nerves: No cranial nerve deficit.  Psychiatric:        Mood and Affect: Mood normal.        Behavior: Behavior normal.        Thought Content: Thought content normal.        Judgment: Judgment normal.   Assessment/Plan:  1. Abdominal swelling Reviewed results os abdominal ultrasound which were normal. Will monitor.   2. Shortness of breath Reviewed echocardiogram results with the patient. Her echocardiogram showed normal size and function of left ventricle. She has mild aortic and mitral valve regurgitation and moderate tricuspid regurgitation. She also has mild pulmonary hypertension. She is routinely followed by Dr. Devona Konig for OSA and pulmonary  issues.   3. Obstructive chronic bronchitis without exacerbation ([place]) Reviewed CT chest with the patient ordered per Dr. Erlinda Hong. Had two solid nodular densities. One is 2.4 cm in diameter in left lower lobe of the lung. Other is 87mm in diameter in right upper lobe. Bronchiectasis was also noted. She has coronary and aortic atherosclerosis as well. She takes lovastatin and baby aspirin every day. Will ensure timely follow up with Dr. Devona Konig for further review and evaluation.   4. Essential hypertension, benign Stable. Continue bp medication as prescribed   5. Aortic atherosclerosis Patient does have coronary artery and aortic atherosclerosis. She is on lovastatin and baby aspirin every day. Will monitor closely.   General Counseling: Sabrina Zamora verbalizes understanding of the findings of todays visit and agrees with plan of treatment. I have discussed any further diagnostic evaluation that may be needed or ordered today. We also reviewed her medications today. she has been encouraged to call the office with any questions or concerns that should arise related to todays visit.   This patient was seen by Leretha Pol FNP Collaboration with Dr Lavera Guise as a part of collaborative care agreement  Total time spent: 30 Minutes   Time spent includes review of chart, medications, test results, and follow up plan with the patient.      Dr Lavera Guise Internal medicine

## 2020-07-01 ENCOUNTER — Ambulatory Visit: Payer: Medicare Other

## 2020-07-07 ENCOUNTER — Other Ambulatory Visit: Payer: Self-pay

## 2020-07-07 MED ORDER — LEVOTHYROXINE SODIUM 25 MCG PO TABS
ORAL_TABLET | ORAL | 3 refills | Status: DC
Start: 2020-07-07 — End: 2021-07-01

## 2020-07-15 ENCOUNTER — Telehealth: Payer: Self-pay

## 2020-07-15 DIAGNOSIS — R911 Solitary pulmonary nodule: Secondary | ICD-10-CM

## 2020-07-15 NOTE — Telephone Encounter (Signed)
Order for CT entered.   Sabrina Zamora please schedule CT and move appts as requested by MD. Please call pt with appts. Thanks.

## 2020-07-15 NOTE — Telephone Encounter (Signed)
Done.. All appts has been sched as requsted Pt was made aware. A NEW upcoming reminder letter will be mailed out.

## 2020-07-15 NOTE — Telephone Encounter (Signed)
-----   Message from Earlie Server, MD sent at 07/15/2020  8:42 AM EST ----- Please schedule her to do CT chest wo at the end of March, move her follow up appt with me to be after CT thanks.

## 2020-07-23 DIAGNOSIS — L570 Actinic keratosis: Secondary | ICD-10-CM | POA: Diagnosis not present

## 2020-07-23 DIAGNOSIS — L718 Other rosacea: Secondary | ICD-10-CM | POA: Diagnosis not present

## 2020-07-23 DIAGNOSIS — D1801 Hemangioma of skin and subcutaneous tissue: Secondary | ICD-10-CM | POA: Diagnosis not present

## 2020-07-23 DIAGNOSIS — Z85828 Personal history of other malignant neoplasm of skin: Secondary | ICD-10-CM | POA: Diagnosis not present

## 2020-07-23 DIAGNOSIS — X32XXXA Exposure to sunlight, initial encounter: Secondary | ICD-10-CM | POA: Diagnosis not present

## 2020-07-23 DIAGNOSIS — Z85820 Personal history of malignant melanoma of skin: Secondary | ICD-10-CM | POA: Diagnosis not present

## 2020-07-23 DIAGNOSIS — Z08 Encounter for follow-up examination after completed treatment for malignant neoplasm: Secondary | ICD-10-CM | POA: Diagnosis not present

## 2020-07-23 DIAGNOSIS — D485 Neoplasm of uncertain behavior of skin: Secondary | ICD-10-CM | POA: Diagnosis not present

## 2020-07-23 DIAGNOSIS — D171 Benign lipomatous neoplasm of skin and subcutaneous tissue of trunk: Secondary | ICD-10-CM | POA: Diagnosis not present

## 2020-07-26 ENCOUNTER — Emergency Department
Admission: EM | Admit: 2020-07-26 | Discharge: 2020-07-26 | Disposition: A | Discharge status: Home / Self Care | Payer: Medicare Other | Attending: Emergency Medicine | Admitting: Emergency Medicine

## 2020-07-26 ENCOUNTER — Encounter: Payer: Self-pay | Admitting: Intensive Care

## 2020-07-26 ENCOUNTER — Other Ambulatory Visit: Payer: Self-pay

## 2020-07-26 DIAGNOSIS — Z85828 Personal history of other malignant neoplasm of skin: Secondary | ICD-10-CM | POA: Insufficient documentation

## 2020-07-26 DIAGNOSIS — I10 Essential (primary) hypertension: Secondary | ICD-10-CM | POA: Diagnosis not present

## 2020-07-26 DIAGNOSIS — K5900 Constipation, unspecified: Secondary | ICD-10-CM | POA: Diagnosis not present

## 2020-07-26 DIAGNOSIS — J45909 Unspecified asthma, uncomplicated: Secondary | ICD-10-CM | POA: Insufficient documentation

## 2020-07-26 DIAGNOSIS — Z79899 Other long term (current) drug therapy: Secondary | ICD-10-CM | POA: Diagnosis not present

## 2020-07-26 DIAGNOSIS — E039 Hypothyroidism, unspecified: Secondary | ICD-10-CM | POA: Diagnosis not present

## 2020-07-26 DIAGNOSIS — Z7982 Long term (current) use of aspirin: Secondary | ICD-10-CM | POA: Diagnosis not present

## 2020-07-26 HISTORY — DX: Pure hypercholesterolemia, unspecified: E78.00

## 2020-07-26 HISTORY — DX: Essential (primary) hypertension: I10

## 2020-07-26 MED ORDER — MAGNESIUM CITRATE PO SOLN
1.0000 | Freq: Once | ORAL | Status: AC
  Administered 2020-07-26: 1 via ORAL
  Filled 2020-07-26: qty 296

## 2020-07-26 NOTE — Discharge Instructions (Addendum)
Continue to use your MiraLAX.  Drink a good deal of fluids tonight.  A big bottle of Gatorade would be fine.  You can try Tucks medicated pads or Balneol to help you keep clean when you stool.  You were somewhat irritated down there tonight.  Please return for any worsening of pain or fever or vomiting or any other problems.  If the weather is bad and you do not feel safe driving call the ambulance.

## 2020-07-26 NOTE — ED Provider Notes (Signed)
[facility] Emergency Department Provider Note   ____________________________________________   Event Date/Time   First MD Initiated Contact with Patient 07/26/20 2008     (approximate)  I have reviewed the triage vital signs and the nursing notes.   HISTORY  Chief Complaint Fecal Impaction    HPI Sabrina Zamora is a 87 y.o. female who reports 3 days of constipation.  She has some hemorrhoids as well.  She was having some perirectal pain especially after she did have a stool 20 minutes before getting into the emergency room.  It was very hard and painful when she had some blood mixed in with it.  She thinks this is probably due to her hemorrhoids.  Now she is having a small amount of lower abdominal pain which was something she had not had before.         Past Medical History:  Diagnosis Date  . Asthma   . High cholesterol   . Hypertension   . Hypothyroidism   . Osteoporosis   . Skin cancer 2012, 2016    Patient Active Problem List   Diagnosis Date Noted  . Aortic atherosclerosis ([place]) 06/30/2020  . Abdominal swelling 06/01/2020  . SOB (shortness of breath) 06/01/2020  . Encounter for general adult medical examination with abnormal findings 05/26/2019  . Palpitations 05/26/2019  . Screening for colon cancer 05/26/2019  . Cholecystitis 12/28/2018  . Pancreatitis 12/28/2018  . Oxygen dependent 12/28/2018  . Obstructive chronic bronchitis without exacerbation ([place]) 12/28/2018  . Screening for breast cancer 05/04/2018  . Family history of coronary artery disease 05/04/2018  . Vitamin D deficiency 05/04/2018  . Anxiety 10/31/2017  . Fibrocystic breast disease 10/31/2017  . History of melanoma 10/31/2017  . Obstructive sleep apnea (adult) (pediatric) 10/31/2017  . Hypothyroidism 10/31/2017  . Essential hypertension, benign 10/31/2017  . Urinary tract infection without hematuria 10/31/2017  . Dysuria 10/31/2017  . Erythrocytosis  05/06/2015  . Melanoma of trunk ([place]) 02/05/2015  . Osteoporosis 02/19/2014    Past Surgical History:  Procedure Laterality Date  . BREAST BIOPSY Left 2003   neg  . BREAST BIOPSY Right 09/27/2017   12:00 1 cmfn fibroadenomatous change   . BREAST BIOPSY Right 09/17/2017   12:00 3 cmfn benign breast and fibroadipose tissue  . BREAST EXCISIONAL BIOPSY Left 1988   excisional - neg  . CATARACT EXTRACTION W/PHACO Left 09/20/2017   Procedure: CATARACT EXTRACTION PHACO AND INTRAOCULAR LENS PLACEMENT ([place]) LEFT;  Surgeon: Leandrew Koyanagi, MD;  Location: [place];  Service: Ophthalmology;  Laterality: Left;  . CATARACT EXTRACTION W/PHACO Right 02/15/2018   Procedure: CATARACT EXTRACTION PHACO AND INTRAOCULAR LENS PLACEMENT ([place]) TOPICAL  RIGHT;  Surgeon: Leandrew Koyanagi, MD;  Location: [place];  Service: Ophthalmology;  Laterality: Right;  prefers early  . CHOLECYSTECTOMY      Prior to Admission medications   Medication Sig Start Date End Date Taking? Authorizing Provider  amLODipine (NORVASC) 2.5 MG tablet Take 1 to 2 tab po daily for blood pressure 11/26/19   Ronnell Freshwater, NP  aspirin EC 81 MG tablet Take 81 mg by mouth.    [provider]  Cholecalciferol (VITAMIN D PO) Take by mouth daily.     [provider]  conjugated estrogens (PREMARIN) vaginal cream Place 1 Applicatorful vaginally daily. Use pea sized amount M-W-Fr before bedtime 12/22/17   Hollice Espy, MD  denosumab (PROLIA) 60 MG/ML SOSY injection Inject into the skin. 04/06/19   [provider]  estradiol (ESTRACE) 0.1 MG/GM vaginal cream Place 1 Applicatorful vaginally at bedtime. 12/18/19   Hollice Espy, MD  Fluticasone-Salmeterol (ADVAIR) 250-50 MCG/DOSE AEPB Inhale 1 puff into the lungs 2 (two) times daily. 05/09/20   Ronnell Freshwater, NP  levothyroxine (SYNTHROID) 25 MCG tablet Take 1.5 tab po Daily before breakfast 07/07/20   Ronnell Freshwater, NP  lovastatin  (MEVACOR) 20 MG tablet Take 1 tablet (20 mg total) by mouth at bedtime. 02/07/20   Ronnell Freshwater, NP  Multiple Vitamin (MULTI-VITAMINS) TABS Take by mouth.    [provider]  mupirocin nasal ointment (BACTROBAN) 2 % Place 1 application into the nose at bedtime. 10/04/18   Ronnell Freshwater, NP  Probiotic Product (PROBIOTIC DAILY PO) Take by mouth daily.    [provider]  Zinc 25 MG TABS Take by mouth daily.    [provider]    Allergies Levofloxacin, Penicillins, Betaine, Ciprofloxacin, and Sulfa antibiotics  Family History  Problem Relation Age of Onset  . Clotting disorder Sister   . Breast cancer Sister 16    Social History Social History   Tobacco Use  . Smoking status: Never Smoker  . Smokeless tobacco: Never Used  Vaping Use  . Vaping Use: Never used  Substance Use Topics  . Alcohol use: Never    Alcohol/week: 0.0 standard drinks  . Drug use: Never    Review of Systems  Constitutional: No fever/chills Eyes: No visual changes. ENT: No sore throat. Cardiovascular: Denies chest pain. Respiratory: Denies shortness of breath. Gastrointestinal: Mild abdominal pain which developed several minutes after I did my rectal exam.  No nausea, no vomiting.  No diarrhea. constipation. Genitourinary: Negative for dysuria. Musculoskeletal: Negative for back pain. Skin: Negative for rash. Neurological: Negative for headaches, focal weakness  ____________________________________________   PHYSICAL EXAM:  VITAL SIGNS: ED Triage Vitals  Enc Vitals Group     BP 07/26/20 1709 138/75     Pulse Rate 07/26/20 1709 84     Resp 07/26/20 1709 16     Temp 07/26/20 1706 98 F (36.7 C)     Temp Source 07/26/20 1706 Oral     SpO2 07/26/20 1709 99 %     Weight 07/26/20 1708 100 lb (45.4 kg)     Height 07/26/20 1708 5' 2.5" (1.588 m)     Head Circumference --      Peak Flow --      Pain Score 07/26/20 1708 7     Pain Loc --      Pain Edu? --       Excl. in [place]? --     Constitutional: Alert and oriented.  Looks somewhat uncomfortable Eyes: Conjunctivae are normal.  Head: Atraumatic. Nose: No congestion/rhinnorhea. Mouth/Throat: Mucous membranes are moist. Neck: No stridor. Cardiovascular: Normal rate, regular rhythm. Grossly normal heart sounds.  Good peripheral circulation. Respiratory: Normal respiratory effort.  No retractions. Lungs CTAB. Gastrointestinal: Soft and nontender. No distention. No abdominal bruits. Rectal: Hemorrhoids are present there is some erythema surrounding them as though patient has rubbed a lot with toilet tissue.  Rectal itself shows no stool as far as I can reach with my finger no masses inside. Musculoskeletal: No lower extremity tenderness  Neurologic:  Normal speech and language. No gross focal neurologic deficits are appreciated. Skin:  Skin is warm, dry and intact. No rash noted.   ____________________________________________   LABS (all labs ordered are listed, but only abnormal results are displayed)  Labs Reviewed - No data  to display ____________________________________________  EKG   ____________________________________________  RADIOLOGY Gertha Calkin, personally viewed and evaluated these images (plain radiographs) as part of my medical decision making, as well as reviewing the written report by the radiologist.  ED MD interpretation:   Official radiology report(s): No results found.  ____________________________________________   PROCEDURES  Procedure(s) performed (including Critical Care):  Procedures   ____________________________________________   INITIAL IMPRESSION / ASSESSMENT AND PLAN / ED COURSE  Patient reports developing some mild lower abdominal pain after coming into room 19.  There was none when I examined her.  I developed afterwards.  She reports is very mild and does not think it is significant and really does not want it checked out further.   Patient will return for any further problems.  If she has any further bleeding I will have her follow-up with her doctor.              ____________________________________________   FINAL CLINICAL IMPRESSION(S) / ED DIAGNOSES  Final diagnoses:  Constipation, unspecified constipation type     ED Discharge Orders    None      *Please note:  Sabrina Zamora was evaluated in Emergency Department on 07/26/2020 for the symptoms described in the history of present illness. She was evaluated in the context of the global COVID-19 pandemic, which necessitated consideration that the patient might be at risk for infection with the SARS-CoV-2 virus that causes COVID-19. Institutional protocols and algorithms that pertain to the evaluation of patients at risk for COVID-19 are in a state of rapid change based on information released by regulatory bodies including the CDC and federal and state organizations. These policies and algorithms were followed during the patient's care in the ED.  Some ED evaluations and interventions may be delayed as a result of limited staffing during and the pandemic.*   Note:  This document was prepared using Dragon voice recognition software and may include unintentional dictation errors.    Nena Polio, MD 07/26/20 2043

## 2020-07-26 NOTE — ED Notes (Signed)
Rectal exam completed by MD with RN as witness.

## 2020-07-26 NOTE — ED Triage Notes (Signed)
Patient c/o constipation and being impacted X3 days. Patient reports feeling shaky and having dry mouth. Tried saline enema at home with no relief. Reports painful rectum and hx hemorrhoids

## 2020-07-30 ENCOUNTER — Other Ambulatory Visit: Payer: Self-pay

## 2020-07-30 ENCOUNTER — Ambulatory Visit (INDEPENDENT_AMBULATORY_CARE_PROVIDER_SITE_OTHER): Payer: Medicare Other | Admitting: Hospice and Palliative Medicine

## 2020-07-30 ENCOUNTER — Encounter: Payer: Self-pay | Admitting: Hospice and Palliative Medicine

## 2020-07-30 VITALS — BP 148/96 | HR 100 | Temp 97.5°F | Resp 16 | Ht 62.5 in | Wt 99.2 lb

## 2020-07-30 DIAGNOSIS — K649 Unspecified hemorrhoids: Secondary | ICD-10-CM | POA: Diagnosis not present

## 2020-07-30 DIAGNOSIS — K5900 Constipation, unspecified: Secondary | ICD-10-CM | POA: Diagnosis not present

## 2020-07-30 DIAGNOSIS — K5649 Other impaction of intestine: Secondary | ICD-10-CM

## 2020-07-30 MED ORDER — HYDROCORTISONE ACETATE 25 MG RE SUPP: 25.0000 mg | Freq: Two times a day (BID) | RECTAL | 0 refills | Status: DC

## 2020-07-30 NOTE — Progress Notes (Signed)
[facility] [place], [place] [address]  Internal MEDICINE  Office Visit Note  Patient Name: Sabrina Zamora  [id number]  [id number]  Date of Service: 08/01/2020     Chief Complaint  Patient presents with  . Acute Visit    Stool impaction, spots of blood, yesterday no blood, but it started up again during the night into the morning, seems more than just a Hemorid   . Hyperlipidemia  . Hypertension  . Asthma     HPI Pt is here for recent emergency department follow up. Presented to emergency department 07/27/19 for 3 days of constipation This is a recurring issue for her, typically able to resolve constipation with MiraLax She had tried taking MiraLax on Friday due to constipation but was unable to have a BM--she developed lower abdominal pain Friday evening and worsened Saturday morning She also started noticing blood in her stool starting Friday--examined by ED physician, external hemorrhoids noted on physical examination Also felt to be internal hemorrhoids--she did not want further work-up at that time  Today she is complaining of diarrhea--she took several doses of MiraLax, she was able to pass a large amount of hard stool Saturday and Sunday Has continued to notice small amount of blood mixed with her stool Lower abdominal pain has resolved  Current Medication: Outpatient Encounter Medications as of 07/30/2020  Medication Sig Note  . hydrocortisone (ANUSOL-HC) 25 MG suppository Place 1 suppository (25 mg total) rectally 2 (two) times daily.   Marland Kitchen amLODipine (NORVASC) 2.5 MG tablet Take 1 to 2 tab po daily for blood pressure   . aspirin EC 81 MG tablet Take 81 mg by mouth. 02/04/2017: Takes every other day.  . Cholecalciferol (VITAMIN D PO) Take by mouth daily.  08/25/2018: Unsure of dose   . conjugated estrogens (PREMARIN) vaginal cream Place 1 Applicatorful vaginally daily. Use pea sized amount M-W-Fr before bedtime   . denosumab (PROLIA) 60 MG/ML  SOSY injection Inject into the skin.   Marland Kitchen estradiol (ESTRACE) 0.1 MG/GM vaginal cream Place 1 Applicatorful vaginally at bedtime.   . Fluticasone-Salmeterol (ADVAIR) 250-50 MCG/DOSE AEPB Inhale 1 puff into the lungs 2 (two) times daily.   Marland Kitchen levothyroxine (SYNTHROID) 25 MCG tablet Take 1.5 tab po Daily before breakfast   . lovastatin (MEVACOR) 20 MG tablet Take 1 tablet (20 mg total) by mouth at bedtime.   . Multiple Vitamin (MULTI-VITAMINS) TABS Take by mouth. 05/06/2015: Received from: [facility]  . mupirocin nasal ointment (BACTROBAN) 2 % Place 1 application into the nose at bedtime.   . Probiotic Product (PROBIOTIC DAILY PO) Take by mouth daily.   . Zinc 25 MG TABS Take by mouth daily.    No facility-administered encounter medications on file as of 07/30/2020.    Surgical History: Past Surgical History:  Procedure Laterality Date  . BREAST BIOPSY Left 2003   neg  . BREAST BIOPSY Right 09/27/2017   12:00 1 cmfn fibroadenomatous change   . BREAST BIOPSY Right 09/17/2017   12:00 3 cmfn benign breast and fibroadipose tissue  . BREAST EXCISIONAL BIOPSY Left 1988   excisional - neg  . CATARACT EXTRACTION W/PHACO Left 09/20/2017   Procedure: CATARACT EXTRACTION PHACO AND INTRAOCULAR LENS PLACEMENT ([place]) LEFT;  Surgeon: Leandrew Koyanagi, MD;  Location: [place];  Service: Ophthalmology;  Laterality: Left;  . CATARACT EXTRACTION W/PHACO Right 02/15/2018   Procedure: CATARACT EXTRACTION PHACO AND INTRAOCULAR LENS PLACEMENT ([place]) TOPICAL  RIGHT;  Surgeon: Leandrew Koyanagi, MD;  Location:  [place];  Service: Ophthalmology;  Laterality: Right;  prefers early  . CHOLECYSTECTOMY      Medical History: Past Medical History:  Diagnosis Date  . Asthma   . High cholesterol   . Hypertension   . Hypothyroidism   . Osteoporosis   . Skin cancer 2012, 2016    Family History: Family History  Problem Relation Age of Onset  . Clotting disorder Sister   . Breast  cancer Sister 57    Social History   Socioeconomic History  . Marital status: Married    Spouse name: Not on file  . Number of children: Not on file  . Years of education: Not on file  . Highest education level: Not on file  Occupational History  . Not on file  Tobacco Use  . Smoking status: Never Smoker  . Smokeless tobacco: Never Used  Vaping Use  . Vaping Use: Never used  Substance and Sexual Activity  . Alcohol use: Never    Alcohol/week: 0.0 standard drinks  . Drug use: Never  . Sexual activity: Not on file  Other Topics Concern  . Not on file  Social History Narrative  . Not on file   Social Determinants of Health   Financial Resource Strain: Not on file  Food Insecurity: Not on file  Transportation Needs: Not on file  Physical Activity: Not on file  Stress: Not on file  Social Connections: Not on file  Intimate Partner Violence: Not on file   Review of Systems  Constitutional: Negative for chills, diaphoresis and fatigue.  HENT: Negative for ear pain, postnasal drip and sinus pressure.   Eyes: Negative for photophobia, discharge, redness, itching and visual disturbance.  Respiratory: Negative for cough, shortness of breath and wheezing.   Cardiovascular: Negative for chest pain, palpitations and leg swelling.  Gastrointestinal: Positive for abdominal pain, constipation and diarrhea. Negative for nausea and vomiting.  Genitourinary: Negative for dysuria and flank pain.  Musculoskeletal: Negative for arthralgias, back pain, gait problem and neck pain.  Skin: Negative for color change.  Allergic/Immunologic: Negative for environmental allergies and food allergies.  Neurological: Negative for dizziness and headaches.  Hematological: Does not bruise/bleed easily.  Psychiatric/Behavioral: Negative for agitation, behavioral problems (depression) and hallucinations.    Vital Signs: BP (!) 148/96   Pulse 100   Temp (!) 97.5 F (36.4 C)   Resp 16   Ht 5' 2.5"  (1.588 m)   Wt 99 lb 3.2 oz (45 kg)   SpO2 97%   BMI 17.85 kg/m    Physical Exam Vitals reviewed.  Constitutional:      Appearance: Normal appearance. She is normal weight.  Cardiovascular:     Rate and Rhythm: Normal rate and regular rhythm.     Pulses: Normal pulses.     Heart sounds: Normal heart sounds.  Pulmonary:     Effort: Pulmonary effort is normal.     Breath sounds: Normal breath sounds.  Abdominal:     General: Abdomen is flat.     Palpations: Abdomen is soft.  Musculoskeletal:        General: Normal range of motion.  Skin:    General: Skin is warm.  Neurological:     General: No focal deficit present.     Mental Status: She is alert and oriented to person, place, and time. Mental status is at baseline.  Psychiatric:        Mood and Affect: Mood normal.  Behavior: Behavior normal.        Thought Content: Thought content normal.        Judgment: Judgment normal.     Assessment/Plan: 1. Impaction of colon ([place]) Without obstruction, seen in emergency department Impaction has since resolved, now residual diarrhea from excessive use of MiraLax  2. Constipation, unspecified constipation type Start Colace daily once diarrhea resolves Continue with MiraLax as needed, samples of Linzess given today in office--instructed to take one tablet if she has not had a BM in 3 days Will discuss GI consult with possible colonoscopy  3. Hemorrhoids, unspecified hemorrhoid type Insert 1 suppository twice daily for relief from hemorrhoids Avoid straining or sitting on toilet for extended periods of time - hydrocortisone (ANUSOL-HC) 25 MG suppository; Place 1 suppository (25 mg total) rectally 2 (two) times daily.  Dispense: 12 suppository; Refill: 0  General Counseling: Ambert verbalizes understanding of the findings of todays visit and agrees with plan of treatment. I have discussed any further diagnostic evaluation that may be needed or ordered today. We also reviewed  her medications today. she has been encouraged to call the office with any questions or concerns that should arise related to todays visit.    I have reviewed all medical records from hospital follow up including radiology reports and consults from other physicians. Appropriate follow up diagnostics will be scheduled as needed. Patient/ Family understands the plan of treatment.   Time spent 30 minutes.  This patient was seen by Theodoro Grist AGNP-C in Collaboration with Dr Lavera Guise as a part of collaborative care agreement  Tanna Furry. [facility] Internal Medicine

## 2020-08-01 ENCOUNTER — Encounter: Payer: Self-pay | Admitting: Hospice and Palliative Medicine

## 2020-08-01 ENCOUNTER — Ambulatory Visit: Payer: Medicare Other | Admitting: Hospice and Palliative Medicine

## 2020-08-14 ENCOUNTER — Ambulatory Visit (INDEPENDENT_AMBULATORY_CARE_PROVIDER_SITE_OTHER): Payer: Medicare Other | Admitting: Hospice and Palliative Medicine

## 2020-08-14 ENCOUNTER — Encounter: Payer: Self-pay | Admitting: Internal Medicine

## 2020-08-14 VITALS — BP 138/78 | HR 92 | Temp 97.9°F | Resp 16 | Ht 62.5 in | Wt 98.4 lb

## 2020-08-14 DIAGNOSIS — K5900 Constipation, unspecified: Secondary | ICD-10-CM

## 2020-08-14 DIAGNOSIS — K649 Unspecified hemorrhoids: Secondary | ICD-10-CM | POA: Diagnosis not present

## 2020-08-14 DIAGNOSIS — I10 Essential (primary) hypertension: Secondary | ICD-10-CM

## 2020-08-14 NOTE — Progress Notes (Signed)
[facility] [place], [place] [address]  Internal MEDICINE  Office Visit Note  Patient Name: Sabrina Zamora  [id number]  [id number]  Date of Service: 08/18/2020  Chief Complaint  Patient presents with  . impaction of colon    2 week f-up    HPI Patient is here for routine follow-up She became very upset as she was told she was seeing a different provider today--BP elevated and tearful--was able calm once we talked and was moved to my schedule BP improved on recheck Follow-up on recent ER visit due to constipation and bowel impaction Started her on regimen on colace daily and MiraLax as well as Linzess as needed Explains today that her BM's have returned to her baseline, has had no further issues with constipation or impaction--has had to take one dose of Linzess but was able to have a BM that day after medication She has also not seen any further blood in her stool or when wiping Again, now that her BM have returned to her baseline and with her age she is not interested in proceeding with colonoscopy or GI referral  Current Medication: Outpatient Encounter Medications as of 08/14/2020  Medication Sig Note  . amLODipine (NORVASC) 2.5 MG tablet Take 1 to 2 tab po daily for blood pressure   . aspirin EC 81 MG tablet Take 81 mg by mouth. 02/04/2017: Takes every other day.  . Cholecalciferol (VITAMIN D PO) Take by mouth daily.  08/25/2018: Unsure of dose   . conjugated estrogens (PREMARIN) vaginal cream Place 1 Applicatorful vaginally daily. Use pea sized amount M-W-Fr before bedtime   . denosumab (PROLIA) 60 MG/ML SOSY injection Inject into the skin.   Marland Kitchen estradiol (ESTRACE) 0.1 MG/GM vaginal cream Place 1 Applicatorful vaginally at bedtime.   . Fluticasone-Salmeterol (ADVAIR) 250-50 MCG/DOSE AEPB Inhale 1 puff into the lungs 2 (two) times daily.   . hydrocortisone (ANUSOL-HC) 25 MG suppository Place 1 suppository (25 mg total) rectally 2 (two) times daily.   Marland Kitchen  levothyroxine (SYNTHROID) 25 MCG tablet Take 1.5 tab po Daily before breakfast   . lovastatin (MEVACOR) 20 MG tablet Take 1 tablet (20 mg total) by mouth at bedtime.   . Multiple Vitamin (MULTI-VITAMINS) TABS Take by mouth. 05/06/2015: Received from: [facility]  . mupirocin nasal ointment (BACTROBAN) 2 % Place 1 application into the nose at bedtime.   . Probiotic Product (PROBIOTIC DAILY PO) Take by mouth daily.   . Zinc 25 MG TABS Take by mouth daily.    No facility-administered encounter medications on file as of 08/14/2020.    Surgical History: Past Surgical History:  Procedure Laterality Date  . BREAST BIOPSY Left 2003   neg  . BREAST BIOPSY Right 09/27/2017   12:00 1 cmfn fibroadenomatous change   . BREAST BIOPSY Right 09/17/2017   12:00 3 cmfn benign breast and fibroadipose tissue  . BREAST EXCISIONAL BIOPSY Left 1988   excisional - neg  . CATARACT EXTRACTION W/PHACO Left 09/20/2017   Procedure: CATARACT EXTRACTION PHACO AND INTRAOCULAR LENS PLACEMENT ([place]) LEFT;  Surgeon: Leandrew Koyanagi, MD;  Location: [place];  Service: Ophthalmology;  Laterality: Left;  . CATARACT EXTRACTION W/PHACO Right 02/15/2018   Procedure: CATARACT EXTRACTION PHACO AND INTRAOCULAR LENS PLACEMENT ([place]) TOPICAL  RIGHT;  Surgeon: Leandrew Koyanagi, MD;  Location: [place];  Service: Ophthalmology;  Laterality: Right;  prefers early  . CHOLECYSTECTOMY      Medical History: Past Medical History:  Diagnosis Date  .  Asthma   . High cholesterol   . Hypertension   . Hypothyroidism   . Osteoporosis   . Skin cancer 2012, 2016    Family History: Family History  Problem Relation Age of Onset  . Clotting disorder Sister   . Breast cancer Sister 9    Social History   Socioeconomic History  . Marital status: Married    Spouse name: Not on file  . Number of children: Not on file  . Years of education: Not on file  . Highest education level: Not on file  Occupational  History  . Not on file  Tobacco Use  . Smoking status: Never Smoker  . Smokeless tobacco: Never Used  Vaping Use  . Vaping Use: Never used  Substance and Sexual Activity  . Alcohol use: Never    Alcohol/week: 0.0 standard drinks  . Drug use: Never  . Sexual activity: Not on file  Other Topics Concern  . Not on file  Social History Narrative  . Not on file   Social Determinants of Health   Financial Resource Strain: Not on file  Food Insecurity: Not on file  Transportation Needs: Not on file  Physical Activity: Not on file  Stress: Not on file  Social Connections: Not on file  Intimate Partner Violence: Not on file      Review of Systems  Constitutional: Negative for chills, diaphoresis and fatigue.  HENT: Negative for ear pain, postnasal drip and sinus pressure.   Eyes: Negative for photophobia, discharge, redness, itching and visual disturbance.  Respiratory: Negative for cough, shortness of breath and wheezing.   Cardiovascular: Negative for chest pain, palpitations and leg swelling.  Gastrointestinal: Negative for abdominal pain, constipation, diarrhea, nausea and vomiting.  Genitourinary: Negative for dysuria and flank pain.  Musculoskeletal: Negative for arthralgias, back pain, gait problem and neck pain.  Skin: Negative for color change.  Allergic/Immunologic: Negative for environmental allergies and food allergies.  Neurological: Negative for dizziness and headaches.  Hematological: Does not bruise/bleed easily.  Psychiatric/Behavioral: Negative for agitation, behavioral problems (depression) and hallucinations.    Vital Signs: BP 138/78   Pulse 92   Temp 97.9 F (36.6 C)   Resp 16   Ht 5' 2.5" (1.588 m)   Wt 98 lb 6.4 oz (44.6 kg)   SpO2 99%   BMI 17.71 kg/m    Physical Exam Vitals reviewed.  Constitutional:      Appearance: Normal appearance. She is normal weight.  Cardiovascular:     Rate and Rhythm: Normal rate and regular rhythm.      Pulses: Normal pulses.     Heart sounds: Normal heart sounds.  Pulmonary:     Effort: Pulmonary effort is normal.     Breath sounds: Normal breath sounds.  Abdominal:     General: Abdomen is flat.  Musculoskeletal:        General: Normal range of motion.     Cervical back: Normal range of motion.  Skin:    General: Skin is warm.  Neurological:     General: No focal deficit present.     Mental Status: She is alert and oriented to person, place, and time. Mental status is at baseline.  Psychiatric:        Mood and Affect: Mood normal.        Behavior: Behavior normal.        Thought Content: Thought content normal.        Judgment: Judgment normal.    Assessment/Plan: 1.  Essential hypertension BP on recheck after she was able to relax--continue with current therapy  2. Constipation, unspecified constipation type Resolved at this time--continue with current regimen Declines colonoscopy screening at this time  3. Hemorrhoids, unspecified hemorrhoid type Resolved at this time, no further episodes of bleeding since constipation resolved Closely monitor--consider fecal testing if bleeding reoccurs  General Counseling: Sabrina Zamora verbalizes understanding of the findings of todays visit and agrees with plan of treatment. I have discussed any further diagnostic evaluation that may be needed or ordered today. We also reviewed her medications today. she has been encouraged to call the office with any questions or concerns that should arise related to todays visit.   Time spent: 30 Minutes Time spent includes review of chart, medications, test results and follow-up plan with the patient.  This patient was seen by Theodoro Grist AGNP-C in Collaboration with Dr Lavera Guise as a part of collaborative care agreement     Tanna Furry. Twana Wileman AGNP-C Internal medicine

## 2020-08-15 ENCOUNTER — Ambulatory Visit: Payer: Medicare Other | Admitting: Hospice and Palliative Medicine

## 2020-08-18 ENCOUNTER — Encounter: Payer: Self-pay | Admitting: Hospice and Palliative Medicine

## 2020-08-25 DIAGNOSIS — D485 Neoplasm of uncertain behavior of skin: Secondary | ICD-10-CM | POA: Diagnosis not present

## 2020-08-26 ENCOUNTER — Other Ambulatory Visit: Payer: Self-pay | Admitting: Hospice and Palliative Medicine

## 2020-08-26 DIAGNOSIS — Z1231 Encounter for screening mammogram for malignant neoplasm of breast: Secondary | ICD-10-CM

## 2020-09-19 DIAGNOSIS — L538 Other specified erythematous conditions: Secondary | ICD-10-CM | POA: Diagnosis not present

## 2020-09-19 DIAGNOSIS — L820 Inflamed seborrheic keratosis: Secondary | ICD-10-CM | POA: Diagnosis not present

## 2020-09-24 ENCOUNTER — Other Ambulatory Visit: Payer: Self-pay

## 2020-09-24 ENCOUNTER — Ambulatory Visit: Payer: Medicare Other

## 2020-09-25 ENCOUNTER — Ambulatory Visit
Admission: RE | Admit: 2020-09-25 | Discharge: 2020-09-25 | Disposition: A | Discharge status: Home / Self Care | Payer: Medicare Other | Source: Ambulatory Visit | Attending: Hospice and Palliative Medicine | Admitting: Hospice and Palliative Medicine

## 2020-09-25 DIAGNOSIS — Z1231 Encounter for screening mammogram for malignant neoplasm of breast: Secondary | ICD-10-CM | POA: Insufficient documentation

## 2020-09-26 ENCOUNTER — Ambulatory Visit: Payer: Medicare Other | Admitting: Oncology

## 2020-09-26 ENCOUNTER — Other Ambulatory Visit: Payer: Medicare Other

## 2020-09-29 ENCOUNTER — Other Ambulatory Visit: Payer: Self-pay | Admitting: Hospice and Palliative Medicine

## 2020-09-29 DIAGNOSIS — R921 Mammographic calcification found on diagnostic imaging of breast: Secondary | ICD-10-CM

## 2020-09-29 DIAGNOSIS — R928 Other abnormal and inconclusive findings on diagnostic imaging of breast: Secondary | ICD-10-CM

## 2020-09-30 ENCOUNTER — Ambulatory Visit: Payer: Medicare Other | Admitting: Internal Medicine

## 2020-10-01 ENCOUNTER — Other Ambulatory Visit: Payer: Self-pay

## 2020-10-01 ENCOUNTER — Ambulatory Visit (INDEPENDENT_AMBULATORY_CARE_PROVIDER_SITE_OTHER): Payer: Medicare Other

## 2020-10-01 ENCOUNTER — Other Ambulatory Visit: Payer: Self-pay | Admitting: Hospice and Palliative Medicine

## 2020-10-01 DIAGNOSIS — G4733 Obstructive sleep apnea (adult) (pediatric): Secondary | ICD-10-CM

## 2020-10-01 NOTE — Progress Notes (Signed)
95 percentile pressure 10   95th percentile leak 12.1   apnea index 0.3 /hr  apnea-hypopnea index  0.4 /hr   total days used  >4 hr 74  days  total days used <4 hr 0 days  Total compliance 82 percent  She is doing well on cpap. She wanted to let us know that May 3,2022 she will be having surgery and will be off cpap approximately 2 weeks.

## 2020-10-06 ENCOUNTER — Ambulatory Visit
Admission: RE | Admit: 2020-10-06 | Discharge: 2020-10-06 | Disposition: A | Discharge status: Home / Self Care | Payer: Medicare Other | Source: Ambulatory Visit | Attending: Oncology | Admitting: Oncology

## 2020-10-06 ENCOUNTER — Other Ambulatory Visit: Payer: Self-pay

## 2020-10-06 DIAGNOSIS — R918 Other nonspecific abnormal finding of lung field: Secondary | ICD-10-CM | POA: Diagnosis not present

## 2020-10-06 DIAGNOSIS — I700 Atherosclerosis of aorta: Secondary | ICD-10-CM | POA: Diagnosis not present

## 2020-10-06 DIAGNOSIS — R911 Solitary pulmonary nodule: Secondary | ICD-10-CM | POA: Insufficient documentation

## 2020-10-06 DIAGNOSIS — I2510 Atherosclerotic heart disease of native coronary artery without angina pectoris: Secondary | ICD-10-CM | POA: Diagnosis not present

## 2020-10-07 ENCOUNTER — Other Ambulatory Visit: Payer: Self-pay

## 2020-10-07 DIAGNOSIS — D751 Secondary polycythemia: Secondary | ICD-10-CM

## 2020-10-07 DIAGNOSIS — R911 Solitary pulmonary nodule: Secondary | ICD-10-CM

## 2020-10-08 ENCOUNTER — Ambulatory Visit
Admission: RE | Admit: 2020-10-08 | Discharge: 2020-10-08 | Disposition: A | Discharge status: Home / Self Care | Payer: Medicare Other | Source: Ambulatory Visit | Attending: Hospice and Palliative Medicine | Admitting: Hospice and Palliative Medicine

## 2020-10-08 ENCOUNTER — Other Ambulatory Visit: Payer: Self-pay

## 2020-10-08 ENCOUNTER — Inpatient Hospital Stay (HOSPITAL_BASED_OUTPATIENT_CLINIC_OR_DEPARTMENT_OTHER): Payer: Medicare Other | Admitting: Oncology

## 2020-10-08 ENCOUNTER — Inpatient Hospital Stay: Payer: Medicare Other

## 2020-10-08 ENCOUNTER — Inpatient Hospital Stay: Payer: Medicare Other | Attending: Oncology

## 2020-10-08 ENCOUNTER — Encounter: Payer: Self-pay | Admitting: Oncology

## 2020-10-08 ENCOUNTER — Other Ambulatory Visit: Payer: Self-pay | Admitting: *Deleted

## 2020-10-08 VITALS — BP 131/79 | HR 80 | Temp 98.1°F | Resp 17

## 2020-10-08 VITALS — BP 197/81 | HR 89 | Temp 97.6°F | Resp 16 | Wt 96.9 lb

## 2020-10-08 DIAGNOSIS — G4733 Obstructive sleep apnea (adult) (pediatric): Secondary | ICD-10-CM | POA: Diagnosis not present

## 2020-10-08 DIAGNOSIS — R911 Solitary pulmonary nodule: Secondary | ICD-10-CM | POA: Diagnosis not present

## 2020-10-08 DIAGNOSIS — Z832 Family history of diseases of the blood and blood-forming organs and certain disorders involving the immune mechanism: Secondary | ICD-10-CM | POA: Insufficient documentation

## 2020-10-08 DIAGNOSIS — J479 Bronchiectasis, uncomplicated: Secondary | ICD-10-CM | POA: Diagnosis not present

## 2020-10-08 DIAGNOSIS — R928 Other abnormal and inconclusive findings on diagnostic imaging of breast: Secondary | ICD-10-CM | POA: Insufficient documentation

## 2020-10-08 DIAGNOSIS — Z79899 Other long term (current) drug therapy: Secondary | ICD-10-CM | POA: Insufficient documentation

## 2020-10-08 DIAGNOSIS — Z882 Allergy status to sulfonamides status: Secondary | ICD-10-CM | POA: Insufficient documentation

## 2020-10-08 DIAGNOSIS — Z880 Allergy status to penicillin: Secondary | ICD-10-CM | POA: Diagnosis not present

## 2020-10-08 DIAGNOSIS — Z85820 Personal history of malignant melanoma of skin: Secondary | ICD-10-CM | POA: Insufficient documentation

## 2020-10-08 DIAGNOSIS — D751 Secondary polycythemia: Secondary | ICD-10-CM

## 2020-10-08 DIAGNOSIS — Z803 Family history of malignant neoplasm of breast: Secondary | ICD-10-CM | POA: Insufficient documentation

## 2020-10-08 DIAGNOSIS — I10 Essential (primary) hypertension: Secondary | ICD-10-CM | POA: Insufficient documentation

## 2020-10-08 DIAGNOSIS — Z881 Allergy status to other antibiotic agents status: Secondary | ICD-10-CM | POA: Diagnosis not present

## 2020-10-08 DIAGNOSIS — Z9049 Acquired absence of other specified parts of digestive tract: Secondary | ICD-10-CM | POA: Insufficient documentation

## 2020-10-08 DIAGNOSIS — R921 Mammographic calcification found on diagnostic imaging of breast: Secondary | ICD-10-CM | POA: Insufficient documentation

## 2020-10-08 LAB — CBC WITH DIFFERENTIAL/PLATELET
Abs Immature Granulocytes: 0.03 10*3/uL (ref 0.00–0.07)
Basophils Absolute: 0.1 10*3/uL (ref 0.0–0.1)
Basophils Relative: 1 %
Eosinophils Absolute: 0.2 10*3/uL (ref 0.0–0.5)
Eosinophils Relative: 3 %
HCT: 50.8 % — ABNORMAL HIGH (ref 36.0–46.0)
Hemoglobin: 16.7 g/dL — ABNORMAL HIGH (ref 12.0–15.0)
Immature Granulocytes: 1 %
Lymphocytes Relative: 34 %
Lymphs Abs: 1.9 10*3/uL (ref 0.7–4.0)
MCH: 31.3 pg (ref 26.0–34.0)
MCHC: 32.9 g/dL (ref 30.0–36.0)
MCV: 95.1 fL (ref 80.0–100.0)
Monocytes Absolute: 0.7 10*3/uL (ref 0.1–1.0)
Monocytes Relative: 12 %
Neutro Abs: 2.8 10*3/uL (ref 1.7–7.7)
Neutrophils Relative %: 49 %
Platelets: 229 10*3/uL (ref 150–400)
RBC: 5.34 MIL/uL — ABNORMAL HIGH (ref 3.87–5.11)
RDW: 13.2 % (ref 11.5–15.5)
WBC: 5.7 10*3/uL (ref 4.0–10.5)
nRBC: 0 % (ref 0.0–0.2)

## 2020-10-08 NOTE — Progress Notes (Signed)
Patient denies new problems/concerns today.   °

## 2020-10-08 NOTE — Progress Notes (Signed)
Pt received 200 ml phlebotomy today. Tolerated very well. Denies any dizziness or weakness. Declined a snack. Ambulated with patient to lobby at discharge where her husband was waiting for her.

## 2020-10-08 NOTE — Progress Notes (Signed)
[facility] day:  10/08/20  Chief Complaint: Sabrina Zamora is a 87 y.o. female with a history of secondary polycythemia presents for follow up .   HPI:  The patient was last seen in the medical oncology clinic on 02/04/2017.  At that time, she felt good.  Exam was unremarkable.  Hematocrit was 46.1 and hemoglobin 15.6.  She had mot required a phlebotomy since 04/2015.  Follow-up appointments were spread out.  CBC was to be checked every 6 months.   Patient previously followed up with Dr. Mike Gip.  Patient switched care to me on 11/29/2018. Extensive chart review was performed.  #Secondary polycythemia Patient has a history of sleep apnea she wears oxygen at night but no CPAP mask.  Never smoker. Work-up on 01/11/2012 reviewed and negative Jak 2 V617F mutation, exon 12 mutation. 08/25/2018 CALR negative, JAK 2 E12-15 negative, MPL negative.  Patient has been on phlebotomy program to keep hematocrit less than 48 and then later to keep it less than 50.  #History of melanoma x3.  T1 a melanoma in August 2012. saw Dermatology and had topical chemotherapy treatment to her forehead lesion.   #She has been compliant with CPAP machine. She did nocturnal oxygen testing and she is not qualified for nocturnal oxygen.   # 06/26/2020, CT without contrast showed a partial solid nodule within the superior segment of the left lower pole.  This measures 2.4 cm with a solid component measuring 5 mm.  Bronchiectasis with tree-in-bud nodularity is noted within the anterior and posterior basal portions of the right upper lobe. #  She is a never smoker.  Report second hand smoke exposure when she grew up and during the initial few years of her marriage.    Her husband stopped smoking in 1960s.    INTERVAL HISTORY Sabrina Zamora is a 87 y.o. female who has above history reviewed by me today presents for follow up visit for management of secondary  erythrocytosis. Problems and complaints are listed below: Patient reports well.  No new complaints.  Denies any headache, or focal deficit.  She feels that her anxious as she is here to discuss her lab report and CT results.  Denies any cough, shortness of breath, unintentional weight loss.   Review of Systems  Constitutional: Negative for appetite change, chills, fatigue and fever.  HENT:   Negative for hearing loss and voice change.   Eyes: Negative for eye problems.  Respiratory: Negative for chest tightness and cough.   Cardiovascular: Negative for chest pain.  Gastrointestinal: Negative for abdominal distention, abdominal pain and blood in stool.  Endocrine: Negative for hot flashes.  Genitourinary: Negative for difficulty urinating and frequency.   Musculoskeletal: Negative for arthralgias.  Skin: Negative for itching and rash.  Neurological: Negative for extremity weakness.  Hematological: Negative for adenopathy.  Psychiatric/Behavioral: Negative for confusion. The patient is nervous/anxious.     Past Medical History:  Diagnosis Date  . Asthma   . High cholesterol   . Hypertension   . Hypothyroidism   . Osteoporosis   . Skin cancer 2012, 2016    Past Surgical History:  Procedure Laterality Date  . BREAST BIOPSY Left 2003   neg  . BREAST BIOPSY Right 09/27/2017   12:00 1 cmfn fibroadenomatous change   . BREAST BIOPSY Right 09/17/2017   12:00 3 cmfn benign breast and fibroadipose tissue  . BREAST EXCISIONAL BIOPSY Left 1988   excisional - neg  . CATARACT  EXTRACTION W/PHACO Left 09/20/2017   Procedure: CATARACT EXTRACTION PHACO AND INTRAOCULAR LENS PLACEMENT ([place]) LEFT;  Surgeon: Leandrew Koyanagi, MD;  Location: [facility];  Service: Ophthalmology;  Laterality: Left;  . CATARACT EXTRACTION W/PHACO Right 02/15/2018   Procedure: CATARACT EXTRACTION PHACO AND INTRAOCULAR LENS PLACEMENT ([place]) TOPICAL  RIGHT;  Surgeon: Leandrew Koyanagi, MD;  Location: [facility];  Service: Ophthalmology;  Laterality: Right;  prefers early  . CHOLECYSTECTOMY      Family History  Problem Relation Age of Onset  . Clotting disorder Sister   . Breast cancer Sister 4    Social History:  reports that she has never smoked. She has never used smokeless tobacco. She reports that she does not drink alcohol and does not use drugs.  She has never smoked.  The patient is alone today.  Allergies:  Allergies  Allergen Reactions  . Levofloxacin Rash and Shortness Of Breath  . Penicillins Hives and Rash  . Tobramycin   . Betaine Rash  . Ciprofloxacin Itching and Rash  . Sulfa Antibiotics Rash    Current Medications: Current Outpatient Medications  Medication Sig Dispense Refill  . amLODipine (NORVASC) 2.5 MG tablet Take 1 to 2 tab po daily for blood pressure 45 tablet 5  . aspirin EC 81 MG tablet Take 81 mg by mouth.    . Cholecalciferol (VITAMIN D PO) Take by mouth daily.     Marland Kitchen conjugated estrogens (PREMARIN) vaginal cream Place 1 Applicatorful vaginally daily. Use pea sized amount M-W-Fr before bedtime 42.5 g 3  . denosumab (PROLIA) 60 MG/ML SOSY injection Inject into the skin.    Marland Kitchen estradiol (ESTRACE) 0.1 MG/GM vaginal cream Place 1 Applicatorful vaginally at bedtime. 42.5 g 12  . Fluticasone-Salmeterol (ADVAIR) 250-50 MCG/DOSE AEPB Inhale 1 puff into the lungs 2 (two) times daily. 60 each 3  . levothyroxine (SYNTHROID) 25 MCG tablet Take 1.5 tab po Daily before breakfast 135 tablet 3  . lovastatin (MEVACOR) 20 MG tablet Take 1 tablet (20 mg total) by mouth at bedtime. 90 tablet 2  . Multiple Vitamin (MULTI-VITAMINS) TABS Take by mouth.    . mupirocin nasal ointment (BACTROBAN) 2 % Place 1 application into the nose at bedtime. 10 g 1  . Probiotic Product (PROBIOTIC DAILY PO) Take by mouth daily.    . Zinc 25 MG TABS Take by mouth daily.    . hydrocortisone (ANUSOL-HC) 25 MG suppository Place 1 suppository (25 mg total) rectally 2 (two) times daily.  (Patient not taking: Reported on 10/08/2020) 12 suppository 0   No current facility-administered medications for this visit.    Physical Exam: Blood pressure (!) 197/81, pulse 89, temperature 97.6 F (36.4 C), resp. rate 16, weight 96 lb 14.4 oz (44 kg).  Physical Exam Constitutional:      General: She is not in acute distress.    Appearance: She is not diaphoretic.     Comments: Thin built, walks independently.   HENT:     Head: Normocephalic and atraumatic.     Nose: Nose normal.     Mouth/Throat:     Pharynx: No oropharyngeal exudate.  Eyes:     General: No scleral icterus.    Pupils: Pupils are equal, round, and reactive to light.  Cardiovascular:     Rate and Rhythm: Normal rate and regular rhythm.     Heart sounds: No murmur heard.   Pulmonary:     Effort: Pulmonary effort is normal. No respiratory distress.     Breath  sounds: No rales.  Chest:     Chest wall: No tenderness.  Abdominal:     General: There is no distension.     Palpations: Abdomen is soft.     Tenderness: There is no abdominal tenderness.  Musculoskeletal:        General: Normal range of motion.     Cervical back: Normal range of motion and neck supple.  Skin:    General: Skin is warm and dry.     Findings: No erythema.  Neurological:     Mental Status: She is alert and oriented to person, place, and time.     Cranial Nerves: No cranial nerve deficit.     Motor: No abnormal muscle tone.     Coordination: Coordination normal.  Psychiatric:        Mood and Affect: Affect normal.      Appointment on 10/08/2020  Component Date Value Ref Range Status  . WBC 10/08/2020 5.7  4.0 - 10.5 K/uL Final  . RBC 10/08/2020 5.34* 3.87 - 5.11 MIL/uL Final  . Hemoglobin 10/08/2020 16.7* 12.0 - 15.0 g/dL Final  . HCT 10/08/2020 50.8* 36.0 - 46.0 % Final  . MCV 10/08/2020 95.1  80.0 - 100.0 fL Final  . MCH 10/08/2020 31.3  26.0 - 34.0 pg Final  . MCHC 10/08/2020 32.9  30.0 - 36.0 g/dL Final  . RDW  10/08/2020 13.2  11.5 - 15.5 % Final  . Platelets 10/08/2020 229  150 - 400 K/uL Final  . nRBC 10/08/2020 0.0  0.0 - 0.2 % Final  . Neutrophils Relative % 10/08/2020 49  % Final  . Neutro Abs 10/08/2020 2.8  1.7 - 7.7 K/uL Final  . Lymphocytes Relative 10/08/2020 34  % Final  . Lymphs Abs 10/08/2020 1.9  0.7 - 4.0 K/uL Final  . Monocytes Relative 10/08/2020 12  % Final  . Monocytes Absolute 10/08/2020 0.7  0.1 - 1.0 K/uL Final  . Eosinophils Relative 10/08/2020 3  % Final  . Eosinophils Absolute 10/08/2020 0.2  0.0 - 0.5 K/uL Final  . Basophils Relative 10/08/2020 1  % Final  . Basophils Absolute 10/08/2020 0.1  0.0 - 0.1 K/uL Final  . Immature Granulocytes 10/08/2020 1  % Final  . Abs Immature Granulocytes 10/08/2020 0.03  0.00 - 0.07 K/uL Final   Performed at [facility], [address] [place], [place] [address]    Assessment:  Sabrina Zamora is a 87 y.o. female present for follow up of erythrocytosis.  1. Erythrocytosis   2. Lung nodule   3. Obstructive sleep apnea    # erythrocytosis, presumed to be secondary to sleep apnea.  Labs reviewed and discussed with patient. Hematocrit is above 50.  Proceed with phlebotomy today. Ultrasound abdomen showed no suspicious findings.  #Lung nodule found on chest x-ray and confirmed on CT scan. 10/06/2020, follow-up CT scan showed slight growth of left lower lobe nodule, 2.8 x 2.4 cm.  Central nodular components appears more solid than prior image.  The lesion remains morphologically very concerning for lung cancer. New 5 mm pulmonary nodule of the lateral segment of right middle lobe.  Nonspecific.  Stable 4 mm nodule of the anterior right lower lobe.  Nonspecific.  Numerous additional unchanged cluster and a tree in bud centrilobular nodules consistent with atypical infection, i.e. MAC. I will discuss her CT scan on tumor board.  Most likely need a biopsy.        We spent sufficient time to discuss many  aspect of care, questions  were answered to patient's satisfaction.   Earlie Server, MD  10/08/2020, 7:27 PM

## 2020-10-08 NOTE — Progress Notes (Signed)
MDT

## 2020-10-09 ENCOUNTER — Other Ambulatory Visit: Payer: Self-pay | Admitting: Hospice and Palliative Medicine

## 2020-10-09 DIAGNOSIS — R928 Other abnormal and inconclusive findings on diagnostic imaging of breast: Secondary | ICD-10-CM

## 2020-10-09 DIAGNOSIS — R921 Mammographic calcification found on diagnostic imaging of breast: Secondary | ICD-10-CM

## 2020-10-10 ENCOUNTER — Ambulatory Visit: Payer: Medicare Other | Admitting: Hospice and Palliative Medicine

## 2020-10-10 ENCOUNTER — Telehealth: Payer: Self-pay | Admitting: Hospice and Palliative Medicine

## 2020-10-10 NOTE — Progress Notes (Signed)
  Chronic Care Management   Outreach Note  10/10/2020 Name: Sabrina Zamora MRN: [id number] DOB: [birth year]  Referred by: Luiz Ochoa, NP Reason for referral : No chief complaint on file.   An unsuccessful telephone outreach was attempted today. The patient was referred to the pharmacist for assistance with care management and care coordination.   Follow Up Plan:   Carley Perdue [employer] Scheduler

## 2020-10-14 ENCOUNTER — Ambulatory Visit
Admission: RE | Admit: 2020-10-14 | Discharge: 2020-10-14 | Disposition: A | Discharge status: Home / Self Care | Payer: Medicare Other | Source: Ambulatory Visit | Attending: Hospice and Palliative Medicine | Admitting: Hospice and Palliative Medicine

## 2020-10-14 ENCOUNTER — Other Ambulatory Visit: Payer: Self-pay

## 2020-10-14 DIAGNOSIS — R928 Other abnormal and inconclusive findings on diagnostic imaging of breast: Secondary | ICD-10-CM | POA: Insufficient documentation

## 2020-10-14 DIAGNOSIS — R921 Mammographic calcification found on diagnostic imaging of breast: Secondary | ICD-10-CM | POA: Insufficient documentation

## 2020-10-14 DIAGNOSIS — D0512 Intraductal carcinoma in situ of left breast: Secondary | ICD-10-CM | POA: Diagnosis not present

## 2020-10-14 HISTORY — PX: BREAST BIOPSY: SHX20

## 2020-10-16 ENCOUNTER — Other Ambulatory Visit: Payer: Medicare Other

## 2020-10-16 NOTE — Progress Notes (Signed)
Navigation initiated.  Scheduled surgical consult wirh Dr Dahlia Byes per patient request. Dr. Tasia Catchings notified of diagnosis,  To be presented in case conference today.

## 2020-10-16 NOTE — Progress Notes (Signed)
Tumor Board Documentation  Sabrina Zamora was presented by Dr Tasia Catchings at our Tumor Board on 10/16/2020, which included representatives from medical oncology,radiation oncology,internal medicine,navigation,pathology,radiology,pharmacy,genetics,research,palliative care.  Sabrina Zamora currently presents as a current patient,for discussion with history of the following treatments: active survellience,surgical intervention(s) (Biopsy).  Additionally, we reviewed previous medical and familial history, history of present illness, and recent lab results along with all available histopathologic and imaging studies. The tumor board considered available treatment options and made the following recommendations: Biopsy,Additional screening Versus PET and SBRT  The following procedures/referrals were also placed: No orders of the defined types were placed in this encounter.   Clinical Trial Status: not discussed   Staging used: To be determined  AJCC Staging:       Group: Lung Nodule, Abnormal Mammogram   National site-specific guidelines   were discussed with respect to the case.  Tumor board is a meeting of clinicians from various specialty areas who evaluate and discuss patients for whom a multidisciplinary approach is being considered. Final determinations in the plan of care are those of the provider(s). The responsibility for follow up of recommendations given during tumor board is that of the provider.   Today's extended care, comprehensive team conference, Sabrina Zamora was not present for the discussion and was not examined.   Multidisciplinary Tumor Board is a multidisciplinary case peer review process.  Decisions discussed in the Multidisciplinary Tumor Board reflect the opinions of the specialists present at the conference without having examined the patient.  Ultimately, treatment and diagnostic decisions rest with the primary provider(s) and the patient.

## 2020-10-17 ENCOUNTER — Other Ambulatory Visit: Payer: Self-pay

## 2020-10-17 ENCOUNTER — Inpatient Hospital Stay: Payer: Medicare Other | Attending: Oncology | Admitting: Oncology

## 2020-10-17 ENCOUNTER — Encounter: Payer: Self-pay | Admitting: Oncology

## 2020-10-17 ENCOUNTER — Encounter: Payer: Self-pay | Admitting: Hospice and Palliative Medicine

## 2020-10-17 ENCOUNTER — Ambulatory Visit (INDEPENDENT_AMBULATORY_CARE_PROVIDER_SITE_OTHER): Payer: Medicare Other | Admitting: Physician Assistant

## 2020-10-17 ENCOUNTER — Other Ambulatory Visit: Payer: Self-pay | Admitting: Nurse Practitioner

## 2020-10-17 VITALS — BP 173/98 | HR 90 | Temp 97.4°F | Resp 16 | Wt 96.2 lb

## 2020-10-17 DIAGNOSIS — Z7189 Other specified counseling: Secondary | ICD-10-CM | POA: Diagnosis not present

## 2020-10-17 DIAGNOSIS — Z85820 Personal history of malignant melanoma of skin: Secondary | ICD-10-CM | POA: Insufficient documentation

## 2020-10-17 DIAGNOSIS — D751 Secondary polycythemia: Secondary | ICD-10-CM | POA: Diagnosis not present

## 2020-10-17 DIAGNOSIS — R918 Other nonspecific abnormal finding of lung field: Secondary | ICD-10-CM

## 2020-10-17 DIAGNOSIS — E7800 Pure hypercholesterolemia, unspecified: Secondary | ICD-10-CM | POA: Insufficient documentation

## 2020-10-17 DIAGNOSIS — G4730 Sleep apnea, unspecified: Secondary | ICD-10-CM | POA: Insufficient documentation

## 2020-10-17 DIAGNOSIS — Z7951 Long term (current) use of inhaled steroids: Secondary | ICD-10-CM | POA: Diagnosis not present

## 2020-10-17 DIAGNOSIS — E039 Hypothyroidism, unspecified: Secondary | ICD-10-CM | POA: Diagnosis not present

## 2020-10-17 DIAGNOSIS — Z7982 Long term (current) use of aspirin: Secondary | ICD-10-CM | POA: Insufficient documentation

## 2020-10-17 DIAGNOSIS — D0512 Intraductal carcinoma in situ of left breast: Secondary | ICD-10-CM | POA: Insufficient documentation

## 2020-10-17 DIAGNOSIS — Z79899 Other long term (current) drug therapy: Secondary | ICD-10-CM | POA: Insufficient documentation

## 2020-10-17 DIAGNOSIS — R0602 Shortness of breath: Secondary | ICD-10-CM | POA: Diagnosis not present

## 2020-10-17 DIAGNOSIS — J449 Chronic obstructive pulmonary disease, unspecified: Secondary | ICD-10-CM | POA: Diagnosis not present

## 2020-10-17 DIAGNOSIS — R911 Solitary pulmonary nodule: Secondary | ICD-10-CM | POA: Insufficient documentation

## 2020-10-17 DIAGNOSIS — I10 Essential (primary) hypertension: Secondary | ICD-10-CM | POA: Diagnosis not present

## 2020-10-17 NOTE — Progress Notes (Signed)
[facility] [place], [place] [address]  Pulmonary Sleep Medicine   Office Visit Note  Patient Name: Sabrina Zamora DOB: [birth year] MRN [id number]  Date of Service: 10/22/2020  Complaints/HPI:  Pt is here for pulmonary f/u. She has a repeat chest CT showing slight increase in size of one nodule with suspicious characteristics as well as a new small nodule. Pt has appt with Dr. Tasia Catchings today to discuss this further for next steps. Also found breast cancer on recent mammogram and bx. Appt for further evaluation with general surgery on Monday, but is considering alternate office recommendation. Will see Dr. Tasia Catchings today and discuss further. Pt left number for Webb Silversmith [phone] in case need for further discussion regarding breast cancer diagnosis. Told breast cancer stage 0.  Arlyce Harman done today with FEV1 1.4L,  85%--normal Has some sleep difficulty at times and will take melatonin 3mg , but hasnt taken recently.  CT chest 10/06/20 IMPRESSION: 1. There is a redemonstrated mixed solid and ground-glass nodule of the anterior superior segment left lower lobe, with tenting of the adjacent fissural pleura. The overall dimensions are approximately 2.8 x 2.4 cm, previously 2.6 x 2.3 cm when measured similarly, with nodular components measuring approximately 0.5 cm and 0.7 cm. Central nodular components appear somewhat more solid than on comparison prior although are not substantially changed in size. This lesion remains morphologically very concerning for minimally invasive adenocarcinoma. Recommend multidisciplinary thoracic referral and consideration of tissue sampling and/or PET-CT for metabolic characterization. Solid components of this nodule are approximately at the lower limit of FDG PET resolution and may be technically difficult to characterize. 2. There is a new 5 mm pulmonary nodule of the lateral segment right middle lobe, nonspecific. 3. Stable 4 mm nodule of the  anterior right lower lobe, which remains nonspecific. 4. There are numerous additional unchanged clustered and tree-in-bud centrilobular nodules, particularly in the posterior right upper lobe. 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 5. Coronary artery disease.  Aortic Atherosclerosis (ICD10-I70.0).  Breast Bx results 10/16/20: PATHOLOGY revealed: A. BREAST, LEFT UPPER OUTER QUADRANT AT MIDDLE DEPTH; STEREOTACTIC BIOPSIES: - INTERMEDIATE GRADE DUCTAL CARCINOMA IN SITU (5 MM IN EXTENT) WITH MICROCALCIFICATIONS. Comment: Intermediate grade ductal carcinoma in situ with cribriform architecture and microcalcifications identified in 2 of 6 biopsy cores. Pathology results are CONCORDANT with imaging findings, per Dr. Peggye Fothergill.  ROS  General: (-) fever, (-) chills, (-) night sweats, (-) weakness Skin: (-) rashes, (-) itching,. Eyes: (-) visual changes, (-) redness, (-) itching. Nose and Sinuses: (-) nasal stuffiness or itchiness, (-) postnasal drip, (-) nosebleeds, (-) sinus trouble. Mouth and Throat: (-) sore throat, (-) hoarseness. Neck: (-) swollen glands, (-) enlarged thyroid, (-) neck pain. Respiratory: - cough, (-) bloody sputum, - shortness of breath, - wheezing. Cardiovascular: - ankle swelling, (-) chest pain. Lymphatic: (-) lymph node enlargement. Neurologic: (-) numbness, (-) tingling. Psychiatric: (-) anxiety, (-) depression   Current Medication: Outpatient Encounter Medications as of 10/17/2020  Medication Sig Note  . amLODipine (NORVASC) 2.5 MG tablet Take 1 to 2 tab po daily for blood pressure   . aspirin EC 81 MG tablet Take 81 mg by mouth. 02/04/2017: Takes every other day.  . Cholecalciferol (VITAMIN D PO) Take by mouth daily.  08/25/2018: Unsure of dose   . conjugated estrogens (PREMARIN) vaginal cream Place 1 Applicatorful vaginally daily. Use pea sized amount M-W-Fr before bedtime   . denosumab (PROLIA) 60 MG/ML  SOSY injection Inject into the skin.   Marland Kitchen  estradiol (ESTRACE) 0.1 MG/GM vaginal cream Place 1 Applicatorful vaginally at bedtime.   . Fluticasone-Salmeterol (ADVAIR) 250-50 MCG/DOSE AEPB Inhale 1 puff into the lungs 2 (two) times daily.   Marland Kitchen levothyroxine (SYNTHROID) 25 MCG tablet Take 1.5 tab po Daily before breakfast   . lovastatin (MEVACOR) 20 MG tablet Take 1 tablet (20 mg total) by mouth at bedtime.   . Multiple Vitamin (MULTI-VITAMINS) TABS Take by mouth. 05/06/2015: Received from: [facility]  . mupirocin nasal ointment (BACTROBAN) 2 % Place 1 application into the nose at bedtime.   . Probiotic Product (PROBIOTIC DAILY PO) Take by mouth daily.   . Zinc 25 MG TABS Take by mouth daily.   . [DISCONTINUED] hydrocortisone (ANUSOL-HC) 25 MG suppository Place 1 suppository (25 mg total) rectally 2 (two) times daily. (Patient not taking: No sig reported)    No facility-administered encounter medications on file as of 10/17/2020.    Surgical History: Past Surgical History:  Procedure Laterality Date  . BREAST BIOPSY Left 2003   neg  . BREAST BIOPSY Right 09/27/2017   12:00 1 cmfn fibroadenomatous change   . BREAST BIOPSY Right 09/17/2017   12:00 3 cmfn benign breast and fibroadipose tissue  . BREAST BIOPSY Left 10/14/2020   stereo biopsy, ribbon clip/ path pending  . BREAST EXCISIONAL BIOPSY Left 1988   excisional - neg  . CATARACT EXTRACTION W/PHACO Left 09/20/2017   Procedure: CATARACT EXTRACTION PHACO AND INTRAOCULAR LENS PLACEMENT ([place]) LEFT;  Surgeon: Leandrew Koyanagi, MD;  Location: [facility];  Service: Ophthalmology;  Laterality: Left;  . CATARACT EXTRACTION W/PHACO Right 02/15/2018   Procedure: CATARACT EXTRACTION PHACO AND INTRAOCULAR LENS PLACEMENT ([place]) TOPICAL  RIGHT;  Surgeon: Leandrew Koyanagi, MD;  Location: [facility];  Service: Ophthalmology;  Laterality: Right;  prefers early  . CHOLECYSTECTOMY      Medical History: Past Medical History:   Diagnosis Date  . Asthma   . High cholesterol   . Hypertension   . Hypothyroidism   . Osteoporosis   . Skin cancer 2012, 2016    Family History: Family History  Problem Relation Age of Onset  . Clotting disorder Sister   . Breast cancer Sister 68  . Heart disease Mother   . Heart disease Father     Social History: Social History   Socioeconomic History  . Marital status: Married    Spouse name: Not on file  . Number of children: Not on file  . Years of education: Not on file  . Highest education level: Not on file  Occupational History  . Not on file  Tobacco Use  . Smoking status: Never Smoker  . Smokeless tobacco: Never Used  Vaping Use  . Vaping Use: Never used  Substance and Sexual Activity  . Alcohol use: Never    Alcohol/week: 0.0 standard drinks  . Drug use: Never  . Sexual activity: Not on file  Other Topics Concern  . Not on file  Social History Narrative  . Not on file   Social Determinants of Health   Financial Resource Strain: Not on file  Food Insecurity: Not on file  Transportation Needs: Not on file  Physical Activity: Not on file  Stress: Not on file  Social Connections: Not on file  Intimate Partner Violence: Not on file    Vital Signs: Blood pressure 130/90, pulse 82, temperature 97.6 F (36.4 C), resp. rate 16, height 5' 2.5" (1.588 m), weight 98 lb 9.6 oz (44.7 kg), SpO2 98 %.  Examination: General Appearance: The patient is well-developed, well-nourished, and in no distress. Skin: Gross inspection of skin unremarkable. Head: normocephalic, no gross deformities. Eyes: no gross deformities noted. ENT: ears appear grossly normal no exudates. Neck: Supple. No thyromegaly. No LAD. Respiratory: Lungs clear to auscultation bilaterally. Cardiovascular: Normal S1 and S2 without murmur or rub. Extremities: No cyanosis. pulses are equal. Neurologic: Alert and oriented. No involuntary movements.  LABS:  Radiology: MM CLIP PLACEMENT  LEFT  Result Date: 10/14/2020 CLINICAL DATA:  Confirmation of clip placement after stereotactic tomosynthesis core needle biopsy of calcifications involving the UPPER OUTER QUADRANT of the LEFT breast. EXAM: 2D AND 3D DIAGNOSTIC LEFT MAMMOGRAM POST STEREOTACTIC BIOPSY COMPARISON:  Previous exam(s). FINDINGS: Tomosynthesis and synthesized full field CC and mediolateral images were obtained following stereotactic tomosynthesis guided biopsy of calcifications involving the UPPER OUTER QUADRANT of the LEFT breast. The ribbon shaped biopsy marking clip is appropriately positioned at the site of the biopsied calcifications. There are residual calcifications which extend 7 mm anterior to the clip. Expected post biopsy changes are present without evidence of hematoma. IMPRESSION: Appropriate positioning of the ribbon shaped biopsy marking clip at the site of the biopsied calcifications in the UPPER OUTER QUADRANT of the LEFT breast. There are residual calcifications at the biopsy site which extend 7 mm anterior to the clip. Final Assessment: Post Procedure Mammograms for Marker Placement Electronically Signed   By: Evangeline Dakin M.D.   On: 10/14/2020 13:37   MM LT BREAST BX W LOC DEV 1ST LESION IMAGE BX SPEC STEREO GUIDE  Addendum Date: 10/16/2020   ADDENDUM REPORT: 10/16/2020 11:08 ADDENDUM: PATHOLOGY revealed: A. BREAST, LEFT UPPER OUTER QUADRANT AT MIDDLE DEPTH; STEREOTACTIC BIOPSIES: - INTERMEDIATE GRADE DUCTAL CARCINOMA IN SITU (5 MM IN EXTENT) WITH MICROCALCIFICATIONS. Comment: Intermediate grade ductal carcinoma in situ with cribriform architecture and microcalcifications identified in 2 of 6 biopsy cores. Pathology results are CONCORDANT with imaging findings, per Dr. Peggye Fothergill. Pathology results and recommendations below were discussed with patient by telephone on 10/15/2020. Patient reported no issues at the biopsy site apart from tenderness with no significant bruising. Post biopsy care instructions  were reviewed, questions were answered and my direct phone number was provided to patient. Patient was instructed to call [facility] if any concerns or questions arise related to the biopsy. Recommendation: Surgical consultation. Request for surgical consultation relayed to Al Pimple RN and Tanya Nones RN at [facility] by Electa Sniff RN on 10/15/2020. Pathology results reported by Electa Sniff RN on 10/16/2020. Electronically Signed   By: Evangeline Dakin M.D.   On: 10/16/2020 11:08   Result Date: 10/16/2020 CLINICAL DATA:  87 year old with a screening detected suspicious 9 mm group of calcifications involving the UPPER OUTER QUADRANT of the LEFT breast at MIDDLE depth. EXAM: LEFT BREAST STEREOTACTIC CORE NEEDLE BIOPSY COMPARISON:  Previous exams. FINDINGS: The patient and I discussed the procedure of stereotactic-guided biopsy including benefits and alternatives. We discussed the high likelihood of a successful procedure. We discussed the risks of the procedure including infection, bleeding, tissue injury, clip migration, and inadequate sampling. Informed written consent was given. The usual time out protocol was performed immediately prior to the procedure. Lesion quadrant: UPPER OUTER QUADRANT. Using sterile technique with chlorhexidine as skin antisepsis, 1% lidocaine and 1% lidocaine with epinephrine as local anesthetic, under stereotactic guidance, a 9 gauge Brevera vacuum assisted device was used to perform core needle biopsy of calcifications involving the UPPER OUTER QUADRANT of the LEFT breast using  a superior approach. Specimen radiograph was performed showing calcifications in at least 3 of the core samples. Specimens with calcifications are identified for pathology. At the conclusion of the procedure, a ribbon shaped tissue marker clip was deployed into the biopsy cavity. Follow-up 2-view mammogram was performed and dictated separately. IMPRESSION: Stereotactic-guided  biopsy of calcifications involving the UPPER OUTER QUADRANT of the LEFT breast. No apparent complications. Electronically Signed: By: Evangeline Dakin M.D. On: 10/14/2020 13:34    No results found.  CT Chest Wo Contrast  Result Date: 10/06/2020 CLINICAL DATA:  Follow-up lung nodules EXAM: CT CHEST WITHOUT CONTRAST TECHNIQUE: Multidetector CT imaging of the chest was performed following the standard protocol without IV contrast. COMPARISON:  06/26/2020 FINDINGS: Cardiovascular: Aortic atherosclerosis. Normal heart size. Left coronary artery calcifications. No pericardial effusion. Mediastinum/Nodes: No enlarged mediastinal, hilar, or axillary lymph nodes. Thyroid gland, trachea, and esophagus demonstrate no significant findings. Lungs/Pleura: There is a redemonstrated mixed solid and ground-glass nodule of the anterior superior segment left lower lobe, with tenting of the adjacent fissural pleura. The overall dimensions are approximately 2.8 x 2.4 cm, previously 2.6 x 2.3 cm when measured similarly, with nodular components measuring approximately 0.5 cm and 0.7 cm (series 3, image 73). Central nodular components appear somewhat more solid than on comparison prior although are not substantially changed in size. There is a new 5 mm pulmonary nodule of the lateral segment right middle lobe (series 3, image 96). Stable 4 mm nodule of the anterior right lower lobe (series 3, image 90). There are numerous additional unchanged clustered and tree-in-bud centrilobular nodules in the lungs, particularly in the posterior right upper lobe (series 3, image 62). No pleural effusion or pneumothorax. Upper Abdomen: No acute abnormality. Musculoskeletal: No chest wall mass or suspicious bone lesions identified. IMPRESSION: 1. There is a redemonstrated mixed solid and ground-glass nodule of the anterior superior segment left lower lobe, with tenting of the adjacent fissural pleura. The overall dimensions are approximately 2.8 x  2.4 cm, previously 2.6 x 2.3 cm when measured similarly, with nodular components measuring approximately 0.5 cm and 0.7 cm. Central nodular components appear somewhat more solid than on comparison prior although are not substantially changed in size. This lesion remains morphologically very concerning for minimally invasive adenocarcinoma. Recommend multidisciplinary thoracic referral and consideration of tissue sampling and/or PET-CT for metabolic characterization. Solid components of this nodule are approximately at the lower limit of FDG PET resolution and may be technically difficult to characterize. 2. There is a new 5 mm pulmonary nodule of the lateral segment right middle lobe, nonspecific. 3. Stable 4 mm nodule of the anterior right lower lobe, which remains nonspecific. 4. There are numerous additional unchanged clustered and tree-in-bud centrilobular nodules, particularly in the posterior right upper lobe. 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 5. Coronary artery disease. Aortic Atherosclerosis (ICD10-I70.0). Electronically Signed   By: Eddie Candle M.D.   On: 10/06/2020 14:26   US BREAST LTD UNI LEFT INC AXILLA  Result Date: 10/08/2020 CLINICAL DATA:  Recall from screening mammogram for possible distortion and possible calcifications in the left breast. EXAM: DIGITAL DIAGNOSTIC UNILATERAL LEFT MAMMOGRAM WITH TOMOSYNTHESIS AND CAD; ULTRASOUND LEFT BREAST LIMITED TECHNIQUE: Left digital diagnostic mammography and breast tomosynthesis was performed. The images were evaluated with computer-aided detection.; Targeted ultrasound examination of the left breast was performed COMPARISON:  Previous exam(s). ACR Breast Density Category c: The breast tissue is heterogeneously dense, which may obscure small masses. FINDINGS: The previously described distortion does not  persist with additional views, consistent with superimposed fibroglandular tissue. A questioned 5 mm  mass is seen in the upper central left breast at anterior depth. It is unclear if this mass was present on prior mammograms. Magnification views demonstrate a 9 mm group of fine pleomorphic microcalcifications in the upper outer left breast. Targeted ultrasound was performed in the area of the previously described distortion and in the area of the 5 mm mass. No suspicious solid or cystic mass is identified to correspond to the previously questioned distortion. At 12 o'clock 1 cm from the nipple 3 oval circumscribed hypoechoic masses measure 5 x 3 x 5 mm, 7 x 2 x 4 mm, and 4 x 2 x 4 mm. At 2 o'clock 3 cm from the nipple an oval circumscribed hypoechoic mass measures 8 x 3 x 6 mm. The 5 mm mass likely corresponds to the mass seen on the mammogram. The other masses do not definitely have a mammographic correlates. IMPRESSION: 1. Grouped microcalcifications in the left breast are suspicious for malignancy. 2. Four circumscribed hypoechoic masses in the left breast may represent complicated cysts and are probably benign. RECOMMENDATION: 1. Recommend stereotactic guided left breast biopsy. 2. If biopsy results demonstrate atypia or malignancy and the patient will undergo surgical excision, recommend ultrasound-guided cyst aspirations and possible biopsies of the hypoechoic masses. If biopsy results are benign, recommend follow-up left breast ultrasound in 6 months to document stability. I have discussed the findings and recommendations with the patient. If applicable, a reminder letter will be sent to the patient regarding the next appointment. BI-RADS CATEGORY  4: Suspicious. Electronically Signed   By: Zerita Boers M.D.   On: 10/08/2020 15:19   MM DIAG BREAST TOMO UNI LEFT  Result Date: 10/08/2020 CLINICAL DATA:  Recall from screening mammogram for possible distortion and possible calcifications in the left breast. EXAM: DIGITAL DIAGNOSTIC UNILATERAL LEFT MAMMOGRAM WITH TOMOSYNTHESIS AND CAD; ULTRASOUND LEFT BREAST  LIMITED TECHNIQUE: Left digital diagnostic mammography and breast tomosynthesis was performed. The images were evaluated with computer-aided detection.; Targeted ultrasound examination of the left breast was performed COMPARISON:  Previous exam(s). ACR Breast Density Category c: The breast tissue is heterogeneously dense, which may obscure small masses. FINDINGS: The previously described distortion does not persist with additional views, consistent with superimposed fibroglandular tissue. A questioned 5 mm mass is seen in the upper central left breast at anterior depth. It is unclear if this mass was present on prior mammograms. Magnification views demonstrate a 9 mm group of fine pleomorphic microcalcifications in the upper outer left breast. Targeted ultrasound was performed in the area of the previously described distortion and in the area of the 5 mm mass. No suspicious solid or cystic mass is identified to correspond to the previously questioned distortion. At 12 o'clock 1 cm from the nipple 3 oval circumscribed hypoechoic masses measure 5 x 3 x 5 mm, 7 x 2 x 4 mm, and 4 x 2 x 4 mm. At 2 o'clock 3 cm from the nipple an oval circumscribed hypoechoic mass measures 8 x 3 x 6 mm. The 5 mm mass likely corresponds to the mass seen on the mammogram. The other masses do not definitely have a mammographic correlates. IMPRESSION: 1. Grouped microcalcifications in the left breast are suspicious for malignancy. 2. Four circumscribed hypoechoic masses in the left breast may represent complicated cysts and are probably benign. RECOMMENDATION: 1. Recommend stereotactic guided left breast biopsy. 2. If biopsy results demonstrate atypia or malignancy and the patient will  undergo surgical excision, recommend ultrasound-guided cyst aspirations and possible biopsies of the hypoechoic masses. If biopsy results are benign, recommend follow-up left breast ultrasound in 6 months to document stability. I have discussed the findings and  recommendations with the patient. If applicable, a reminder letter will be sent to the patient regarding the next appointment. BI-RADS CATEGORY  4: Suspicious. Electronically Signed   By: Zerita Boers M.D.   On: 10/08/2020 15:19   MM 3D SCREEN BREAST BILATERAL  Result Date: 09/26/2020 CLINICAL DATA:  Screening. EXAM: DIGITAL SCREENING BILATERAL MAMMOGRAM WITH TOMOSYNTHESIS AND CAD TECHNIQUE: Bilateral screening digital craniocaudal and mediolateral oblique mammograms were obtained. Bilateral screening digital breast tomosynthesis was performed. The images were evaluated with computer-aided detection. COMPARISON:  Previous exam(s). ACR Breast Density Category c: The breast tissue is heterogeneously dense, which may obscure small masses. FINDINGS: In the left breast, possible distortion and separate possible calcifications warrant further evaluation. In the right breast, no findings suspicious for malignancy. IMPRESSION: Further evaluation is suggested for possible distortion and possible calcifications in the left breast. RECOMMENDATION: Diagnostic mammogram and possibly ultrasound of the left breast. (Code:[id number]) The patient will be contacted regarding the findings, and additional imaging will be scheduled. BI-RADS CATEGORY  0: Incomplete. Need additional imaging evaluation and/or prior mammograms for comparison. Electronically Signed   By: Audie Pinto M.D.   On: 09/26/2020 12:20   MM CLIP PLACEMENT LEFT  Result Date: 10/14/2020 CLINICAL DATA:  Confirmation of clip placement after stereotactic tomosynthesis core needle biopsy of calcifications involving the UPPER OUTER QUADRANT of the LEFT breast. EXAM: 2D AND 3D DIAGNOSTIC LEFT MAMMOGRAM POST STEREOTACTIC BIOPSY COMPARISON:  Previous exam(s). FINDINGS: Tomosynthesis and synthesized full field CC and mediolateral images were obtained following stereotactic tomosynthesis guided biopsy of calcifications involving the UPPER OUTER QUADRANT of the LEFT  breast. The ribbon shaped biopsy marking clip is appropriately positioned at the site of the biopsied calcifications. There are residual calcifications which extend 7 mm anterior to the clip. Expected post biopsy changes are present without evidence of hematoma. IMPRESSION: Appropriate positioning of the ribbon shaped biopsy marking clip at the site of the biopsied calcifications in the UPPER OUTER QUADRANT of the LEFT breast. There are residual calcifications at the biopsy site which extend 7 mm anterior to the clip. Final Assessment: Post Procedure Mammograms for Marker Placement Electronically Signed   By: Evangeline Dakin M.D.   On: 10/14/2020 13:37   MM LT BREAST BX W LOC DEV 1ST LESION IMAGE BX SPEC STEREO GUIDE  Addendum Date: 10/16/2020   ADDENDUM REPORT: 10/16/2020 11:08 ADDENDUM: PATHOLOGY revealed: A. BREAST, LEFT UPPER OUTER QUADRANT AT MIDDLE DEPTH; STEREOTACTIC BIOPSIES: - INTERMEDIATE GRADE DUCTAL CARCINOMA IN SITU (5 MM IN EXTENT) WITH MICROCALCIFICATIONS. Comment: Intermediate grade ductal carcinoma in situ with cribriform architecture and microcalcifications identified in 2 of 6 biopsy cores. Pathology results are CONCORDANT with imaging findings, per Dr. Peggye Fothergill. Pathology results and recommendations below were discussed with patient by telephone on 10/15/2020. Patient reported no issues at the biopsy site apart from tenderness with no significant bruising. Post biopsy care instructions were reviewed, questions were answered and my direct phone number was provided to patient. Patient was instructed to call [facility] if any concerns or questions arise related to the biopsy. Recommendation: Surgical consultation. Request for surgical consultation relayed to Al Pimple RN and Tanya Nones RN at [facility] by Electa Sniff RN on 10/15/2020. Pathology results reported by Electa Sniff RN on 10/16/2020. Electronically Signed  By: Evangeline Dakin M.D.   On: 10/16/2020  11:08   Result Date: 10/16/2020 CLINICAL DATA:  87 year old with a screening detected suspicious 9 mm group of calcifications involving the UPPER OUTER QUADRANT of the LEFT breast at MIDDLE depth. EXAM: LEFT BREAST STEREOTACTIC CORE NEEDLE BIOPSY COMPARISON:  Previous exams. FINDINGS: The patient and I discussed the procedure of stereotactic-guided biopsy including benefits and alternatives. We discussed the high likelihood of a successful procedure. We discussed the risks of the procedure including infection, bleeding, tissue injury, clip migration, and inadequate sampling. Informed written consent was given. The usual time out protocol was performed immediately prior to the procedure. Lesion quadrant: UPPER OUTER QUADRANT. Using sterile technique with chlorhexidine as skin antisepsis, 1% lidocaine and 1% lidocaine with epinephrine as local anesthetic, under stereotactic guidance, a 9 gauge Brevera vacuum assisted device was used to perform core needle biopsy of calcifications involving the UPPER OUTER QUADRANT of the LEFT breast using a superior approach. Specimen radiograph was performed showing calcifications in at least 3 of the core samples. Specimens with calcifications are identified for pathology. At the conclusion of the procedure, a ribbon shaped tissue marker clip was deployed into the biopsy cavity. Follow-up 2-view mammogram was performed and dictated separately. IMPRESSION: Stereotactic-guided biopsy of calcifications involving the UPPER OUTER QUADRANT of the LEFT breast. No apparent complications. Electronically Signed: By: Evangeline Dakin M.D. On: 10/14/2020 13:34      Assessment and Plan: Patient Active Problem List   Diagnosis Date Noted  . Goals of care, counseling/discussion 10/17/2020  . Ductal carcinoma in situ (DCIS) of left breast 10/17/2020  . Secondary erythrocytosis 10/17/2020  . Lung nodule 10/17/2020  . Aortic atherosclerosis ([place]) 06/30/2020  . Abdominal swelling  06/01/2020  . SOB (shortness of breath) 06/01/2020  . Encounter for general adult medical examination with abnormal findings 05/26/2019  . Palpitations 05/26/2019  . Screening for colon cancer 05/26/2019  . Cholecystitis 12/28/2018  . Pancreatitis 12/28/2018  . Oxygen dependent 12/28/2018  . Obstructive chronic bronchitis without exacerbation ([place]) 12/28/2018  . Screening for breast cancer 05/04/2018  . Family history of coronary artery disease 05/04/2018  . Vitamin D deficiency 05/04/2018  . Anxiety 10/31/2017  . Fibrocystic breast disease 10/31/2017  . History of melanoma 10/31/2017  . Obstructive sleep apnea (adult) (pediatric) 10/31/2017  . Hypothyroidism 10/31/2017  . Essential hypertension, benign 10/31/2017  . Urinary tract infection without hematuria 10/31/2017  . Dysuria 10/31/2017  . Erythrocytosis 05/06/2015  . Melanoma of trunk ([place]) 02/05/2015  . Osteoporosis 02/19/2014   1. Obstructive chronic bronchitis without exacerbation ([place]) Continue inhaler as prescribed  2. Multiple lung nodules on CT Repeat CT showed some progression, will f/u with oncology  3. Ductal carcinoma in situ (DCIS) of left breast Found on recent biopsy, will follow up with oncology and general surgery for further evaluation  4. Shortness of breath - Spirometry with Graph--FEV1 1.4L 88% predicted, normal spirometry   General Counseling: I have discussed the findings of the evaluation and examination with Marely.  I have also discussed any further diagnostic evaluation thatmay be needed or ordered today. Carmencita verbalizes understanding of the findings of todays visit. We also reviewed her medications today and discussed drug interactions and side effects including but not limited excessive drowsiness and altered mental states. We also discussed that there is always a risk not just to her but also people around her. she has been encouraged to call the office with any questions or concerns that  should arise related to  todays visit.  Orders Placed This Encounter  Procedures  . Spirometry with Graph    Order Specific Question:   Where should this test be performed?    Answer:   [facility]    Order Specific Question:   Basic spirometry    Answer:   Yes     Time spent: 30  I have personally obtained a history, examined the patient, evaluated laboratory and imaging results, formulated the assessment and plan and placed orders. This patient was seen by Drema Dallas, PA-C in collaboration with Dr. Devona Konig as a part of collaborative care agreement.     Allyne Gee, MD [facility] Pulmonary and Critical Care Sleep medicine

## 2020-10-17 NOTE — Progress Notes (Signed)
[facility] day:  10/17/20  Chief Complaint: Sabrina Zamora is a 87 y.o. female with a history of secondary polycythemia presents for follow up .   HPI:  The patient was last seen in the medical oncology clinic on 02/04/2017.  At that time, she felt good.  Exam was unremarkable.  Hematocrit was 46.1 and hemoglobin 15.6.  She had mot required a phlebotomy since 04/2015.  Follow-up appointments were spread out.  CBC was to be checked every 6 months.   Patient previously followed up with Dr. Mike Gip.  Patient switched care to me on 11/29/2018. Extensive chart review was performed.  #Secondary polycythemia Patient has a history of sleep apnea she wears oxygen at night but no CPAP mask.  Never smoker. Work-up on 01/11/2012 reviewed and negative Jak 2 V617F mutation, exon 12 mutation. 08/25/2018 CALR negative, JAK 2 E12-15 negative, MPL negative.  Patient has been on phlebotomy program to keep hematocrit less than 48 and then later to keep it less than 50.  #History of melanoma x3.  T1 a melanoma in August 2012. saw Dermatology and had topical chemotherapy treatment to her forehead lesion.   #She has been compliant with CPAP machine. She did nocturnal oxygen testing and she is not qualified for nocturnal oxygen.   # 06/26/2020, CT without contrast showed a partial solid nodule within the superior segment of the left lower pole.  This measures 2.4 cm with a solid component measuring 5 mm.  Bronchiectasis with tree-in-bud nodularity is noted within the anterior and posterior basal portions of the right upper lobe. #  She is a never smoker.  Report second hand smoke exposure when she grew up and during the initial few years of her marriage.    Her husband stopped smoking in 1960s.    INTERVAL HISTORY Sabrina Zamora is a 87 y.o. female who has above history reviewed by me today presents for follow up visit for management of lung nodule Problems and  complaints are listed below: Patient had CT lung done and presents to discuss results.  10/06/2020 CT chest wo contrast - mixed solid and ground-glass nodule of  the anterior superior segment left lower lobe, with tenting of the adjacent fissural pleura. The overall dimensions are approximately 2.8 x 2.4 cm, previously 2.6 x 2.3 cm when measured similarly, with nodular components measuring approximately 0.5 cm and 0.7 cm.  Central nodular components appear somewhat more solid than on comparison prior although are not substantially changed in size a new 5 mm pulmonary nodule of the lateral segment right middle lobe, nonspecific. Stable 4 mm nodule of the anterior right lower lobe, which remains nonspecific.4. There are numerous additional unchanged clustered and tree-in-bud centrilobular nodules, particularly in the posterior right upper lobe. 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  During the interval she also had mammogram done 09/25/20 screening mammogram showed possible distortion and calcifications in the left breast.  10/08/2020 left diagnostic mammogram showed Grouped microcalcifications in the left breast, Four circumscribed hypoechoic masses in the left breast may represent complicated cysts and are probably benign.   10/14/2020 left breast upper outer quadrant at middle depth-- intermediate grade DCIS, 31mm, with microcalcifications.      Review of Systems  Constitutional: Negative for appetite change, chills, fatigue and fever.  HENT:   Negative for hearing loss and voice change.   Eyes: Negative for eye problems.  Respiratory: Negative for chest tightness and cough.   Cardiovascular:  Negative for chest pain.  Gastrointestinal: Negative for abdominal distention, abdominal pain and blood in stool.  Endocrine: Negative for hot flashes.  Genitourinary: Negative for difficulty urinating and frequency.   Musculoskeletal: Negative for arthralgias.   Skin: Negative for itching and rash.  Neurological: Negative for extremity weakness.  Hematological: Negative for adenopathy.  Psychiatric/Behavioral: Negative for confusion. The patient is nervous/anxious.     Past Medical History:  Diagnosis Date  . Asthma   . High cholesterol   . Hypertension   . Hypothyroidism   . Osteoporosis   . Skin cancer 2012, 2016    Past Surgical History:  Procedure Laterality Date  . BREAST BIOPSY Left 2003   neg  . BREAST BIOPSY Right 09/27/2017   12:00 1 cmfn fibroadenomatous change   . BREAST BIOPSY Right 09/17/2017   12:00 3 cmfn benign breast and fibroadipose tissue  . BREAST BIOPSY Left 10/14/2020   stereo biopsy, ribbon clip/ path pending  . BREAST EXCISIONAL BIOPSY Left 1988   excisional - neg  . CATARACT EXTRACTION W/PHACO Left 09/20/2017   Procedure: CATARACT EXTRACTION PHACO AND INTRAOCULAR LENS PLACEMENT ([place]) LEFT;  Surgeon: Leandrew Koyanagi, MD;  Location: [place];  Service: Ophthalmology;  Laterality: Left;  . CATARACT EXTRACTION W/PHACO Right 02/15/2018   Procedure: CATARACT EXTRACTION PHACO AND INTRAOCULAR LENS PLACEMENT ([place]) TOPICAL  RIGHT;  Surgeon: Leandrew Koyanagi, MD;  Location: [place];  Service: Ophthalmology;  Laterality: Right;  prefers early  . CHOLECYSTECTOMY      Family History  Problem Relation Age of Onset  . Clotting disorder Sister   . Breast cancer Sister 24    Social History:  reports that she has never smoked. She has never used smokeless tobacco. She reports that she does not drink alcohol and does not use drugs.  She has never smoked.  The patient is alone today.  Allergies:  Allergies  Allergen Reactions  . Levofloxacin Rash and Shortness Of Breath  . Penicillins Hives and Rash  . Tobramycin   . Betaine Rash  . Ciprofloxacin Itching and Rash  . Sulfa Antibiotics Rash    Current Medications: Current Outpatient Medications  Medication Sig Dispense Refill  .  amLODipine (NORVASC) 2.5 MG tablet Take 1 to 2 tab po daily for blood pressure 45 tablet 5  . aspirin EC 81 MG tablet Take 81 mg by mouth.    . Cholecalciferol (VITAMIN D PO) Take by mouth daily.     Marland Kitchen conjugated estrogens (PREMARIN) vaginal cream Place 1 Applicatorful vaginally daily. Use pea sized amount M-W-Fr before bedtime 42.5 g 3  . denosumab (PROLIA) 60 MG/ML SOSY injection Inject into the skin.    Marland Kitchen estradiol (ESTRACE) 0.1 MG/GM vaginal cream Place 1 Applicatorful vaginally at bedtime. 42.5 g 12  . Fluticasone-Salmeterol (ADVAIR) 250-50 MCG/DOSE AEPB Inhale 1 puff into the lungs 2 (two) times daily. 60 each 3  . levothyroxine (SYNTHROID) 25 MCG tablet Take 1.5 tab po Daily before breakfast 135 tablet 3  . lovastatin (MEVACOR) 20 MG tablet Take 1 tablet (20 mg total) by mouth at bedtime. 90 tablet 2  . Multiple Vitamin (MULTI-VITAMINS) TABS Take by mouth.    . mupirocin nasal ointment (BACTROBAN) 2 % Place 1 application into the nose at bedtime. 10 g 1  . Probiotic Product (PROBIOTIC DAILY PO) Take by mouth daily.    . Zinc 25 MG TABS Take by mouth daily.     No current facility-administered medications for this visit.    Physical  Exam: Blood pressure (!) 173/98, pulse 90, temperature (!) 97.4 F (36.3 C), resp. rate 16, weight 96 lb 3.2 oz (43.6 kg).  Physical Exam Constitutional:      General: She is not in acute distress.    Appearance: She is not diaphoretic.     Comments: Thin built, walks independently.   HENT:     Head: Normocephalic and atraumatic.     Nose: Nose normal.     Mouth/Throat:     Pharynx: No oropharyngeal exudate.  Eyes:     General: No scleral icterus.    Pupils: Pupils are equal, round, and reactive to light.  Cardiovascular:     Rate and Rhythm: Normal rate and regular rhythm.     Heart sounds: No murmur heard.   Pulmonary:     Effort: Pulmonary effort is normal. No respiratory distress.     Breath sounds: No rales.  Chest:     Chest wall: No  tenderness.  Abdominal:     General: There is no distension.     Palpations: Abdomen is soft.     Tenderness: There is no abdominal tenderness.  Musculoskeletal:        General: Normal range of motion.     Cervical back: Normal range of motion and neck supple.  Skin:    General: Skin is warm and dry.     Findings: No erythema.  Neurological:     Mental Status: She is alert and oriented to person, place, and time.     Cranial Nerves: No cranial nerve deficit.     Motor: No abnormal muscle tone.     Coordination: Coordination normal.  Psychiatric:        Mood and Affect: Affect normal.      No visits with results within 3 Day(s) from this visit.  Latest known visit with results is:  Hospital Outpatient Visit on 10/14/2020  Component Date Value Ref Range Status  . SURGICAL PATHOLOGY 10/14/2020    Final-Edited                   Value:SURGICAL PATHOLOGY CASE: [id number] PATIENT: [facility] Surgical Pathology Report     Specimen Submitted: A. Breast, left  Clinical History: Screening detected suspicious 9 mm group of fine pleomorphic calcifications left breast OUQ at middle depth.  ADH/DICS vs lobular neoplasia.  PREV BX 2003. Ribbon shaped clip deployed.      DIAGNOSIS: A. BREAST, LEFT UPPER OUTER QUADRANT AT MIDDLE DEPTH; STEREOTACTIC BIOPSIES: - INTERMEDIATE GRADE DUCTAL CARCINOMA IN SITU (5 MM IN EXTENT) WITH MICROCALCIFICATIONS.  Comment Intermediate grade ductal carcinoma in situ with cribriform architecture and microcalcifications identified in 2 of 6 biopsy cores.  Estrogen receptor studies will be performed and the findings reported in an addendum.  GROSS DESCRIPTION: A. Labeled: Left breast stereotactic biopsy calcs upper outer quadrant Received: in a formalin-filled Brevera collection device Specimen radiograph image(s) available for review Time/Date in fixative: Co                         llected at 1:12 PM on 10/14/2020 and placed in formalin  at 1:15 PM on 10/14/2020 Cold ischemic time: Less than 5 minutes Total fixation time: 8 hours Core pieces: Multiple Measurement: Aggregate, 6 x 0.9 x 0.4 cm Description / comments: Received are cores and fragments of yellow fibrofatty tissue.  The accompanying diagram has sections A, B, C, D, and E checked. Inked: Black Entirely submitted in cassette(s):  1 - section A 2 - section B 3 - section C 4 - section D 5 - section E 6 - remaining tissue from segment F  RB 10/14/2020  Final Diagnosis performed by Raynelle Bring, MD.   Electronically signed 10/15/2020 1:06:10PM The electronic signature indicates that the named Attending Pathologist has evaluated the specimen Technical component performed at [facility], [address], [place], [place] [address] Lab: [phone] Dir: Rush Farmer, MD, MMM  Professional component performed at [facility], [facility], [place], [place], [place] [address] L                         ab: [phone] Dir: Dellia Nims. Reuel Derby, MD     Assessment:  Sabrina Zamora is a 87 y.o. female present for follow up of erythrocytosis.  1. Erythrocytosis   2. Lung nodule   3. Secondary erythrocytosis   4. Ductal carcinoma in situ (DCIS) of left breast    # erythrocytosis, presumed to be secondary to sleep apnea.  Patient had with phlebotomy on 10/08/2020.Marland Kitchen  Continue monitor blood work and phlebotomy as needed to keep hematocrit less than 50.  #Lung nodule found on chest x-ray and confirmed on CT scan. Patient's case was discussed on tumor board on 10/15/2020.  Consensus reached about proceed with biopsy versus PET scan+ empiric SBRT given her age. I discussed with patient about the tumor board consensus recommendation.  Patient opted to proceed with PET scan and she will be referred to establish care with radiation oncology.  #Left breast DCIS, intermediate grade. Discussed with patient about standard of care including surgical resection followed  by radiation followed by antiestrogen treatment if estrogen receptor is positive..  Also discussed with her about [employer] clinical trial and if she is not interested.  She prefers to proceed with a standard of care. I have advised her to establish care with Dr. Dahlia Byes  Follow-up appointment to be determined.  I will see patient 2 weeks after the DCIS surgery.  We spent sufficient time to discuss many aspect of care, questions were answered to patient's satisfaction.   Earlie Server, MD  10/17/2020, 8:44 PM

## 2020-10-20 ENCOUNTER — Ambulatory Visit (INDEPENDENT_AMBULATORY_CARE_PROVIDER_SITE_OTHER): Payer: Medicare Other | Admitting: Surgery

## 2020-10-20 ENCOUNTER — Encounter: Payer: Self-pay | Admitting: Surgery

## 2020-10-20 ENCOUNTER — Other Ambulatory Visit: Payer: Self-pay

## 2020-10-20 VITALS — BP 164/89 | HR 87 | Temp 97.0°F | Ht 62.5 in | Wt 97.0 lb

## 2020-10-20 DIAGNOSIS — D0512 Intraductal carcinoma in situ of left breast: Secondary | ICD-10-CM

## 2020-10-20 NOTE — Patient Instructions (Signed)
Please see your follow up appointment listed below.   Lumpectomy  A lumpectomy, sometimes called a partial mastectomy, is surgery to remove a cancerous tumor or mass (the lump) from a breast. It is a form of breast-conserving or breast-preservation surgery. This means that the cancerous tissue is removed but the breast remains intact. During a lumpectomy, the portion of the breast that contains the tumor is removed. Some normal tissue around the lump may be taken out to make sure that all of the tumor has been removed. Lymph nodes under your arm may also be removed and tested to find out if the cancer has spread. Lymph nodes are part of the body's disease-fighting system (immune system) and are usually the first place where breast cancer spreads. Tell a health care provider about:  Any allergies you have.  All medicines you are taking, including vitamins, herbs, eye drops, creams, and over-the-counter medicines.  Any problems you or family members have had with anesthetic medicines.  Any blood disorders you have.  Any surgeries you have had.  Any medical conditions you have.  Whether you are pregnant or may be pregnant. What are the risks? Generally, this is a safe procedure. However, problems may occur, including:  Bleeding.  Infection.  Allergic reaction to medicines.  Pain, swelling, weakness, or numbness in the arm on the side of your surgery.  Temporary swelling.  Change in the shape of the breast, particularly if a large portion is removed.  Scar tissue that forms at the surgical site and feels hard to the touch.  Blood clots. What happens before the procedure? Staying hydrated Follow instructions from your health care provider about hydration, which may include:  Up to 2 hours before the procedure - you may continue to drink clear liquids, such as water, clear fruit juice, black coffee, and plain tea.   Eating and drinking restrictions Follow instructions from  your health care provider about eating and drinking, which may include:  8 hours before the procedure - stop eating heavy meals or foods, such as meat, fried foods, or fatty foods.  6 hours before the procedure - stop eating light meals or foods, such as toast or cereal.  6 hours before the procedure - stop drinking milk or drinks that contain milk.  2 hours before the procedure - stop drinking clear liquids. Medicines Ask your health care provider about:  Changing or stopping your regular medicines. This is especially important if you are taking diabetes medicines or blood thinners.  Taking medicines such as aspirin and ibuprofen. These medicines can thin your blood. Do not take these medicines unless your health care provider tells you to take them.  Taking over-the-counter medicines, vitamins, herbs, and supplements. General instructions  Prior to surgery, your health care provider may do a procedure to locate and mark the tumor area in your breast (localization). This will help guide your surgeon to where the incision will be made. This may be done with: ? Imaging, such as a mammogram, ultrasound, or MRI. ? Insertion of a small wire, clip, or seed, or an implant that will reflect a radar signal.  You may have screening tests or exams to get baseline measurements of your arm. These can be compared to measurements done after surgery to monitor for swelling (lymphedema) that can develop after having lymph nodes removed.  Ask your health care provider: ? How your surgery site will be marked. ? What steps will be taken to help prevent infection. These may include:  Washing skin with a germ-killing soap.  Taking antibiotic medicine.  Plan to have someone take you home from the hospital or clinic.  Plan to have a responsible adult care for you for at least 24 hours after you leave the hospital or clinic. This is important. What happens during the procedure?  An IV will be inserted  into one of your veins.  You will be given one or more of the following: ? A medicine to help you relax (sedative). ? A medicine to numb the area (local anesthetic). ? A medicine to make you fall asleep (general anesthetic).  Your health care provider will use a kind of electric scalpel that uses heat to reduce bleeding (electrocautery knife). A curved incision that follows the natural curve of your breast will be made. This type of incision will allow for minimal scarring and better healing.  The tumor will be removed along with some of the tissue around it. This will be sent to the lab for testing. Your health care provider may also remove lymph nodes at this time if needed.  If the tumor is close to the muscles over your chest, some muscle tissue may also be removed.  A small drain tube may be inserted into your breast area or armpit to collect fluid that may build up after surgery. This tube will be connected to a suction bulb on the outside of your body to remove the fluid.  The incision will be closed with stitches (sutures).  A bandage (dressing) may be placed over the incision. The procedure may vary among health care providers and hospitals.   What happens after the procedure?  Your blood pressure, heart rate, breathing rate, and blood oxygen level will be monitored until you leave the hospital or clinic.  You will be given medicine for pain as needed.  Your IV will be removed when you are able to eat and drink by mouth.  You will be encouraged to get up and walk as soon as you can. This is important to improve blood flow and breathing. Ask for help if you feel weak or unsteady.  You may have: ? A drain tube in place for 2-3 days to prevent a collection of blood (hematoma) from developing in the breast. You will be given instructions about caring for the drain before you go home. ? A pressure bandage applied for 1-2 days to prevent bleeding or swelling. Your pressure bandage  may look like a thick piece of fabric or an elastic wrap. Ask your health care provider how to care for your bandage at home.  You may be given a tight sleeve to wear over your arm on the side of your surgery. You should wear this sleeve as told by your health care provider.  Do not drive for 24 hours if you were given a sedative during your procedure. Summary  A lumpectomy, sometimes called a partial mastectomy, is surgery to remove a cancerous tumor or mass (the lump) from a breast.  During a lumpectomy, the portion of the breast that contains the tumor is removed. Lymph nodes under your arm may also be removed and tested to find out if the cancer has spread.  Plan to have someone take you home from the hospital or clinic.  You may have a drain tube in place for 2-3 days to prevent a collection of blood (hematoma) from developing in the breast. You will be given instructions about caring for the drain before you go home.  This information is not intended to replace advice given to you by your health care provider. Make sure you discuss any questions you have with your health care provider. Document Revised: 01/01/2019 Document Reviewed: 01/01/2019 Elsevier Patient Education  [place].

## 2020-10-20 NOTE — Progress Notes (Signed)
Patient ID: Sabrina Zamora, female   DOB: 09/26/1930, 87 y.o.   MRN: [id number]  HPI Sabrina Zamora is a 87 y.o. female seen in consultation at the request of Dr. Tasia Catchings for recently diagnosed DCIS.  She is 32 but with a BMI of 17.4.  She is able to perform more than 4 METS of activity without any shortness or chest pain.  He lives with her husband and is very independent.  Her mind is pristine.  She is competent.  She does all the ADLs on her own. She does have a history of polycythemia.  Last hemoglobin was 16.7 and rest of the CBC was normal.  There was performed 10 days ago.  Last CMP was completely normal including a normal albumin Mammogram personally reviewed revealing evidence of an asymmetry on the left breast upper outer quadrant.  This was biopsy I personally reviewed the biopsy showing evidence of intermediate DCIS 5 mm calcs.  ER/PR still pending at this time. Patient they report history of multiple melanomas one of them requiring left axillary sentinel lymph node.  She did have the melanoma on the left posterior chest wall and this was performed at [facility] a few years ago. Rate talk with Dr. Reviewed regarding her DCIS.  She also had a CT of the chest showing a left lower lobe nodule and she has further PET CT scan next week.  She uses Premarin vaginal cream.  Menopause at age 64.  Menarche at age 36.  She has had 13 pregnancies.  Family history sister With breast cancer and daughter with DCIS.  Daughter required to separate lumpectomies that ultimately require completion mastectomy. Granddaughter apparently had genetic testing, was found to have 30% chance of developing breast cancer. HPI  Past Medical History:  Diagnosis Date  . Asthma   . High cholesterol   . Hypertension   . Hypothyroidism   . Osteoporosis   . Skin cancer 2012, 2016    Past Surgical History:  Procedure Laterality Date  . BREAST BIOPSY Left 2003   neg  . BREAST BIOPSY Right 09/27/2017   12:00 1 cmfn  fibroadenomatous change   . BREAST BIOPSY Right 09/17/2017   12:00 3 cmfn benign breast and fibroadipose tissue  . BREAST BIOPSY Left 10/14/2020   stereo biopsy, ribbon clip/ path pending  . BREAST EXCISIONAL BIOPSY Left 1988   excisional - neg  . CATARACT EXTRACTION W/PHACO Left 09/20/2017   Procedure: CATARACT EXTRACTION PHACO AND INTRAOCULAR LENS PLACEMENT ([place]) LEFT;  Surgeon: Leandrew Koyanagi, MD;  Location: [place];  Service: Ophthalmology;  Laterality: Left;  . CATARACT EXTRACTION W/PHACO Right 02/15/2018   Procedure: CATARACT EXTRACTION PHACO AND INTRAOCULAR LENS PLACEMENT ([place]) TOPICAL  RIGHT;  Surgeon: Leandrew Koyanagi, MD;  Location: [place];  Service: Ophthalmology;  Laterality: Right;  prefers early  . CHOLECYSTECTOMY      Family History  Problem Relation Age of Onset  . Clotting disorder Sister   . Breast cancer Sister 23  . Heart disease Mother   . Heart disease Father     Social History Social History   Tobacco Use  . Smoking status: Never Smoker  . Smokeless tobacco: Never Used  Vaping Use  . Vaping Use: Never used  Substance Use Topics  . Alcohol use: Never    Alcohol/week: 0.0 standard drinks  . Drug use: Never    Allergies  Allergen Reactions  . Levofloxacin Rash and Shortness Of Breath  . Penicillins Hives and Rash  .  Tobramycin   . Betaine Rash  . Ciprofloxacin Itching and Rash  . Sulfa Antibiotics Rash    Current Outpatient Medications  Medication Sig Dispense Refill  . amLODipine (NORVASC) 2.5 MG tablet Take 1 to 2 tab po daily for blood pressure 45 tablet 5  . aspirin EC 81 MG tablet Take 81 mg by mouth.    . Cholecalciferol (VITAMIN D PO) Take by mouth daily.     Marland Kitchen conjugated estrogens (PREMARIN) vaginal cream Place 1 Applicatorful vaginally daily. Use pea sized amount M-W-Fr before bedtime 42.5 g 3  . denosumab (PROLIA) 60 MG/ML SOSY injection Inject into the skin.    Marland Kitchen estradiol (ESTRACE) 0.1 MG/GM vaginal  cream Place 1 Applicatorful vaginally at bedtime. 42.5 g 12  . Fluticasone-Salmeterol (ADVAIR) 250-50 MCG/DOSE AEPB Inhale 1 puff into the lungs 2 (two) times daily. 60 each 3  . levothyroxine (SYNTHROID) 25 MCG tablet Take 1.5 tab po Daily before breakfast 135 tablet 3  . lovastatin (MEVACOR) 20 MG tablet Take 1 tablet (20 mg total) by mouth at bedtime. 90 tablet 2  . Multiple Vitamin (MULTI-VITAMINS) TABS Take by mouth.    . mupirocin nasal ointment (BACTROBAN) 2 % Place 1 application into the nose at bedtime. 10 g 1  . Probiotic Product (PROBIOTIC DAILY PO) Take by mouth daily.    . Zinc 25 MG TABS Take by mouth daily.     No current facility-administered medications for this visit.     Review of Systems Full ROS  was asked and was negative except for the information on the HPI  Physical Exam Blood pressure (!) 164/89, pulse 87, temperature (!) 97 F (36.1 C), temperature source Oral, height 5' 2.5" (1.588 m), weight 97 lb (44 kg), SpO2 97 %. CONSTITUTIONAL: NAD, low BMI EYES: Pupils are equal, round, Sclera are non-icteric. EARS, NOSE, MOUTH AND THROAT: Wearing a mask.Marland Kitchen Hearing is intact to voice. LYMPH NODES:  Lymph nodes in the neck are normal. RESPIRATORY:  Lungs are clear. There is normal respiratory effort, with equal breath sounds bilaterally, and without pathologic use of accessory muscles. CARDIOVASCULAR: Heart is regular without murmurs, gallops, or rubs. BREAST: Right upper quadrant biopsy site. No masses, no skin or nipples changes.  Evidence of melanoma resection left post chest wall. Left axillary scar GI: The abdomen is soft, nontender, and nondistended. There are no palpable masses. There is no hepatosplenomegaly. There are normal bowel sounds in all quadrants. GU: Rectal deferred.   MUSCULOSKELETAL: Normal muscle strength and tone. No cyanosis or edema.   SKIN: Turgor is good and there are no pathologic skin lesions or ulcers. NEUROLOGIC: Motor and sensation is  grossly normal. Cranial nerves are grossly intact. PSYCH:  Oriented to person, place and time. Affect is normal.  Data Reviewed  I have personally reviewed the patient's imaging, laboratory findings and medical records.    Assessment/Plan 87 year old very functional patient with newly diagnosed DCIS intermediate Left breast.  Seems to be very early stage.  I had an extensive discussion with patient and her husband regarding her diagnosis.  Daughter was witnessed in our office visit via her cell phone.  Options of involving her in a clinical trial with oncology versus lumpectomy plus or minus sentinel lymph node biopsy as well as mastectomy were discussed with her in detail. She wishes to think about it.  She does have an upcoming PET/CT and we will wait for those results.  I will see her back in about 10 days to further discuss  surgical therapy.  She has a lot to think about and wishes to have time before making up her mind.  Extensive counseling provided We also talked about the possibility of the biopsy being upgraded to invasive cancer.  I also discussed with her the role of sentinel lymph node biopsy on DCIS.  Time spent with the patient was 70 minutes, with more than 50% of the time spent in face-to-face education, counseling and care coordination.     Caroleen Hamman, MD FACS General Surgeon 10/20/2020, 3:11 PM

## 2020-10-21 LAB — SURGICAL PATHOLOGY

## 2020-10-28 ENCOUNTER — Ambulatory Visit
Admission: RE | Admit: 2020-10-28 | Discharge: 2020-10-28 | Disposition: A | Discharge status: Home / Self Care | Payer: Medicare Other | Source: Ambulatory Visit | Attending: Oncology | Admitting: Oncology

## 2020-10-28 ENCOUNTER — Other Ambulatory Visit: Payer: Self-pay

## 2020-10-28 DIAGNOSIS — R911 Solitary pulmonary nodule: Secondary | ICD-10-CM

## 2020-10-28 DIAGNOSIS — I2510 Atherosclerotic heart disease of native coronary artery without angina pectoris: Secondary | ICD-10-CM | POA: Insufficient documentation

## 2020-10-28 DIAGNOSIS — I700 Atherosclerosis of aorta: Secondary | ICD-10-CM | POA: Diagnosis not present

## 2020-10-28 LAB — GLUCOSE, CAPILLARY: Glucose-Capillary: 81 mg/dL (ref 70–99)

## 2020-10-28 MED ORDER — FLUDEOXYGLUCOSE F - 18 (FDG) INJECTION
5.2000 | Freq: Once | INTRAVENOUS | Status: AC | PRN
  Administered 2020-10-28: 5.727 via INTRAVENOUS

## 2020-10-30 ENCOUNTER — Telehealth: Payer: Self-pay

## 2020-10-30 NOTE — Telephone Encounter (Signed)
Done..  Appts to RTC in 3 mths for lab/MD has been sched as requested I was unable to reach pt to make her aware. A NEW appt letter will be mailed out making her aware of her **NEW** sched appts.

## 2020-10-30 NOTE — Telephone Encounter (Signed)
Patient notified. Patient will see Dr. Baruch Gouty on Monday 4/25.   Please schedule lab/MD (cbc,cmp,ldh) in 3 months and notify pt of appt.

## 2020-10-30 NOTE — Telephone Encounter (Signed)
Patient has been notified of 3 month follow up appts.

## 2020-10-30 NOTE — Telephone Encounter (Signed)
-----   Message from Earlie Server, MD sent at 10/30/2020  8:47 AM EDT ----- Please let her know that PET scan did not show activity of her left lower lobe nodule, however, as we discussed previously, CT appearance is indicative of lung cancer. Please refer her to see Radonc for discussion of empiric radiation.  Follow up with me in 3 months, lab cbc cmp ldh.

## 2020-10-31 ENCOUNTER — Ambulatory Visit: Payer: Medicare Other | Admitting: Hospice and Palliative Medicine

## 2020-10-31 ENCOUNTER — Encounter: Payer: Self-pay | Admitting: Hospice and Palliative Medicine

## 2020-10-31 ENCOUNTER — Ambulatory Visit (INDEPENDENT_AMBULATORY_CARE_PROVIDER_SITE_OTHER): Payer: Medicare Other | Admitting: Hospice and Palliative Medicine

## 2020-10-31 VITALS — BP 140/78 | HR 76 | Temp 97.4°F | Resp 16 | Ht 62.5 in | Wt 97.2 lb

## 2020-10-31 DIAGNOSIS — R918 Other nonspecific abnormal finding of lung field: Secondary | ICD-10-CM | POA: Diagnosis not present

## 2020-10-31 DIAGNOSIS — I10 Essential (primary) hypertension: Secondary | ICD-10-CM | POA: Diagnosis not present

## 2020-10-31 DIAGNOSIS — D0512 Intraductal carcinoma in situ of left breast: Secondary | ICD-10-CM | POA: Diagnosis not present

## 2020-10-31 NOTE — Progress Notes (Signed)
[facility] [place], [place] [address]  Internal MEDICINE  Office Visit Note  Patient Name: Sabrina Zamora  [id number]  [id number]  Date of Service: 11/07/2020  Chief Complaint  Patient presents with  . Follow-up    Review pet scan   . Hypertension  . Hyperlipidemia  . Asthma    HPI Patient is here for routine follow-up Had a recent PET scan and is being followed by oncology for recent diagnosis of ductal carcinoma in situ to left breast as well as lung nodules Awaiting follow-up with oncology to discuss further results and possible treatment options Planning to get a second opinion from another general surgeon regarding surgical treatment options for breast cancer Will likely need to undergo surgery as well as radiation therapy  She is feeling well--full of energy and wanting to seek aggressive treatment options Anxious about possibility of upcoming surgical procedures  Daughter from out of state is planning to come visit this upcoming week in order to be able to go with her to multiple office visits she has scheduled  Denies pain, appetite remains balanced, sleeping well at night Denies difficulty with her breathing or shortness of breath  Current Medication: Outpatient Encounter Medications as of 10/31/2020  Medication Sig  . amLODipine (NORVASC) 2.5 MG tablet Take 1 to 2 tab po daily for blood pressure  . aspirin EC 81 MG tablet Take 81 mg by mouth.  . Cholecalciferol (VITAMIN D PO) Take by mouth daily.   Marland Kitchen denosumab (PROLIA) 60 MG/ML SOSY injection Inject into the skin.  Marland Kitchen Fluticasone-Salmeterol (ADVAIR) 250-50 MCG/DOSE AEPB Inhale 1 puff into the lungs 2 (two) times daily.  Marland Kitchen levothyroxine (SYNTHROID) 25 MCG tablet Take 1.5 tab po Daily before breakfast  . lovastatin (MEVACOR) 20 MG tablet Take 1 tablet (20 mg total) by mouth at bedtime.  . Multiple Vitamin (MULTI-VITAMINS) TABS Take by mouth.  . mupirocin nasal ointment (BACTROBAN) 2 %  Place 1 application into the nose at bedtime.  . Probiotic Product (PROBIOTIC DAILY PO) Take by mouth daily.  . Zinc 25 MG TABS Take by mouth daily.  . [DISCONTINUED] conjugated estrogens (PREMARIN) vaginal cream Place 1 Applicatorful vaginally daily. Use pea sized amount M-W-Fr before bedtime (Patient not taking: Reported on 10/31/2020)  . [DISCONTINUED] estradiol (ESTRACE) 0.1 MG/GM vaginal cream Place 1 Applicatorful vaginally at bedtime. (Patient not taking: Reported on 10/31/2020)   No facility-administered encounter medications on file as of 10/31/2020.    Surgical History: Past Surgical History:  Procedure Laterality Date  . BREAST BIOPSY Left 2003   neg  . BREAST BIOPSY Right 09/27/2017   12:00 1 cmfn fibroadenomatous change   . BREAST BIOPSY Right 09/17/2017   12:00 3 cmfn benign breast and fibroadipose tissue  . BREAST BIOPSY Left 10/14/2020   stereo biopsy, ribbon clip/ path pending  . BREAST EXCISIONAL BIOPSY Left 1988   excisional - neg  . CATARACT EXTRACTION W/PHACO Left 09/20/2017   Procedure: CATARACT EXTRACTION PHACO AND INTRAOCULAR LENS PLACEMENT ([place]) LEFT;  Surgeon: Leandrew Koyanagi, MD;  Location: [facility];  Service: Ophthalmology;  Laterality: Left;  . CATARACT EXTRACTION W/PHACO Right 02/15/2018   Procedure: CATARACT EXTRACTION PHACO AND INTRAOCULAR LENS PLACEMENT ([place]) TOPICAL  RIGHT;  Surgeon: Leandrew Koyanagi, MD;  Location: [place];  Service: Ophthalmology;  Laterality: Right;  prefers early  . CHOLECYSTECTOMY      Medical History: Past Medical History:  Diagnosis Date  . Asthma   . High cholesterol   .  Hypertension   . Hypothyroidism   . Osteoporosis   . Skin cancer 2012, 2016    Family History: Family History  Problem Relation Age of Onset  . Clotting disorder Sister   . Breast cancer Sister 39  . Heart disease Mother   . Heart disease Father     Social History   Socioeconomic History  . Marital status: Married     Spouse name: Not on file  . Number of children: Not on file  . Years of education: Not on file  . Highest education level: Not on file  Occupational History  . Not on file  Tobacco Use  . Smoking status: Never Smoker  . Smokeless tobacco: Never Used  Vaping Use  . Vaping Use: Never used  Substance and Sexual Activity  . Alcohol use: Never    Alcohol/week: 0.0 standard drinks  . Drug use: Never  . Sexual activity: Not on file  Other Topics Concern  . Not on file  Social History Narrative  . Not on file   Social Determinants of Health   Financial Resource Strain: Not on file  Food Insecurity: Not on file  Transportation Needs: Not on file  Physical Activity: Not on file  Stress: Not on file  Social Connections: Not on file  Intimate Partner Violence: Not on file      Review of Systems  Constitutional: Negative for chills, diaphoresis and fatigue.  HENT: Negative for ear pain, postnasal drip and sinus pressure.   Eyes: Negative for photophobia, discharge, redness, itching and visual disturbance.  Respiratory: Negative for cough, shortness of breath and wheezing.   Cardiovascular: Negative for chest pain, palpitations and leg swelling.  Gastrointestinal: Negative for abdominal pain, constipation, diarrhea, nausea and vomiting.  Genitourinary: Negative for dysuria and flank pain.  Musculoskeletal: Negative for arthralgias, back pain, gait problem and neck pain.  Skin: Negative for color change.  Allergic/Immunologic: Negative for environmental allergies and food allergies.  Neurological: Negative for dizziness and headaches.  Hematological: Does not bruise/bleed easily.  Psychiatric/Behavioral: Negative for agitation, behavioral problems (depression) and hallucinations.    Vital Signs: BP 140/78   Pulse 76   Temp (!) 97.4 F (36.3 C)   Resp 16   Ht 5' 2.5" (1.588 m)   Wt 97 lb 3.2 oz (44.1 kg)   SpO2 99%   BMI 17.49 kg/m    Physical Exam Vitals reviewed.   Constitutional:      Appearance: Normal appearance. She is normal weight.  Cardiovascular:     Rate and Rhythm: Normal rate and regular rhythm.     Pulses: Normal pulses.     Heart sounds: Normal heart sounds.  Pulmonary:     Effort: Pulmonary effort is normal.     Breath sounds: Normal breath sounds.  Abdominal:     General: Abdomen is flat.     Palpations: Abdomen is soft.  Musculoskeletal:        General: Normal range of motion.     Cervical back: Normal range of motion.  Skin:    General: Skin is warm.  Neurological:     General: No focal deficit present.     Mental Status: She is alert and oriented to person, place, and time. Mental status is at baseline.  Psychiatric:        Mood and Affect: Mood normal.        Behavior: Behavior normal.        Thought Content: Thought content normal.  Judgment: Judgment normal.    Assessment/Plan: 1. Ductal carcinoma in situ (DCIS) of left breast Being followed and managed by oncology--awaiting all scan results and potential treatment option discussions, has follow-up next week  2. Multiple lung nodules on CT Being followed and managed by oncology--awaiting all scan results and potential treatment option discussions, has follow-up next week  3. Essential hypertension BP and HR well managed, continue to monitor  General Counseling: Larayne verbalizes understanding of the findings of todays visit and agrees with plan of treatment. I have discussed any further diagnostic evaluation that may be needed or ordered today. We also reviewed her medications today. she has been encouraged to call the office with any questions or concerns that should arise related to todays visit.   Time spent:30 Minutes Time spent includes review of chart, medications, test results and follow-up plan with the patient.  This patient was seen by Theodoro Grist AGNP-C in Collaboration with Dr Lavera Guise as a part of collaborative care agreement      Tanna Furry. Farhana Fellows AGNP-C Internal medicine

## 2020-11-03 ENCOUNTER — Ambulatory Visit
Admission: RE | Admit: 2020-11-03 | Discharge: 2020-11-03 | Disposition: A | Discharge status: Home / Self Care | Payer: Medicare Other | Source: Ambulatory Visit | Attending: Radiation Oncology | Admitting: Radiation Oncology

## 2020-11-03 ENCOUNTER — Encounter: Payer: Self-pay | Admitting: Radiation Oncology

## 2020-11-03 VITALS — BP 175/85 | HR 75 | Wt 98.5 lb

## 2020-11-03 DIAGNOSIS — E039 Hypothyroidism, unspecified: Secondary | ICD-10-CM | POA: Diagnosis not present

## 2020-11-03 DIAGNOSIS — I10 Essential (primary) hypertension: Secondary | ICD-10-CM | POA: Insufficient documentation

## 2020-11-03 DIAGNOSIS — R918 Other nonspecific abnormal finding of lung field: Secondary | ICD-10-CM | POA: Diagnosis not present

## 2020-11-03 DIAGNOSIS — M810 Age-related osteoporosis without current pathological fracture: Secondary | ICD-10-CM | POA: Insufficient documentation

## 2020-11-03 DIAGNOSIS — Z803 Family history of malignant neoplasm of breast: Secondary | ICD-10-CM | POA: Diagnosis not present

## 2020-11-03 DIAGNOSIS — Z85828 Personal history of other malignant neoplasm of skin: Secondary | ICD-10-CM | POA: Insufficient documentation

## 2020-11-03 DIAGNOSIS — E7800 Pure hypercholesterolemia, unspecified: Secondary | ICD-10-CM | POA: Diagnosis not present

## 2020-11-03 DIAGNOSIS — Z79899 Other long term (current) drug therapy: Secondary | ICD-10-CM | POA: Insufficient documentation

## 2020-11-03 DIAGNOSIS — D0512 Intraductal carcinoma in situ of left breast: Secondary | ICD-10-CM | POA: Diagnosis not present

## 2020-11-03 DIAGNOSIS — Z7982 Long term (current) use of aspirin: Secondary | ICD-10-CM | POA: Diagnosis not present

## 2020-11-03 NOTE — Consult Note (Signed)
NEW PATIENT EVALUATION  Name: Sabrina Zamora  MRN: [id number]  Date:   11/03/2020     DOB: March 22, 1931   This 87 y.o. female patient presents to the clinic for initial evaluation of as yet unbiopsied left lung probable malignancy as well as DCIS of the left breast.  REFERRING PHYSICIAN: Luiz Ochoa, NP  CHIEF COMPLAINT:  Chief Complaint  Patient presents with  . Consult    DIAGNOSIS: The encounter diagnosis was Ductal carcinoma in situ (DCIS) of left breast.   PREVIOUS INVESTIGATIONS:  Pathology report reviewed PET/CT and CT scans reviewed Clinical notes reviewed  HPI: Patient is an 87 year old female in overall good condition.  They have been following a partly solid nodule in the left lower lobe.  This was first diagnosed in 60 and this area has progressed slowly over the past year with CT scan in March showing dimensions of 2.8 x 2.4 cm from previous 2.6 to 2.3 cm.  The central nodule components were not substantially changed in size.  Patient underwent a PET CT scan showed no hypermetabolic activity in this region.  In the meantime she has been diagnosed with a area of DCIS in her left breast which is waiting on surgical opinion for possible lumpectomy.  She is asymptomatic specifically denies cough hemoptysis chest tightness or any breast complaints.   PLANNED TREATMENT REGIMEN: Lumpectomy for her DCIS as well as observation for the lung mass  PAST MEDICAL HISTORY:  has a past medical history of Asthma, High cholesterol, Hypertension, Hypothyroidism, Osteoporosis, and Skin cancer (2012, 2016).    PAST SURGICAL HISTORY:  Past Surgical History:  Procedure Laterality Date  . BREAST BIOPSY Left 2003   neg  . BREAST BIOPSY Right 09/27/2017   12:00 1 cmfn fibroadenomatous change   . BREAST BIOPSY Right 09/17/2017   12:00 3 cmfn benign breast and fibroadipose tissue  . BREAST BIOPSY Left 10/14/2020   stereo biopsy, ribbon clip/ path pending  . BREAST EXCISIONAL BIOPSY Left  1988   excisional - neg  . CATARACT EXTRACTION W/PHACO Left 09/20/2017   Procedure: CATARACT EXTRACTION PHACO AND INTRAOCULAR LENS PLACEMENT ([place]) LEFT;  Surgeon: Leandrew Koyanagi, MD;  Location: [place];  Service: Ophthalmology;  Laterality: Left;  . CATARACT EXTRACTION W/PHACO Right 02/15/2018   Procedure: CATARACT EXTRACTION PHACO AND INTRAOCULAR LENS PLACEMENT ([place]) TOPICAL  RIGHT;  Surgeon: Leandrew Koyanagi, MD;  Location: [place];  Service: Ophthalmology;  Laterality: Right;  prefers early  . CHOLECYSTECTOMY      FAMILY HISTORY: family history includes Breast cancer (age of onset: 5) in her sister; Clotting disorder in her sister; Heart disease in her father and mother.  SOCIAL HISTORY:  reports that she has never smoked. She has never used smokeless tobacco. She reports that she does not drink alcohol and does not use drugs.  ALLERGIES: Levofloxacin, Penicillins, Tobramycin, Betaine, Ciprofloxacin, and Sulfa antibiotics  MEDICATIONS:  Current Outpatient Medications  Medication Sig Dispense Refill  . amLODipine (NORVASC) 2.5 MG tablet Take 1 to 2 tab po daily for blood pressure 45 tablet 5  . aspirin EC 81 MG tablet Take 81 mg by mouth.    . Cholecalciferol (VITAMIN D PO) Take by mouth daily.     Marland Kitchen denosumab (PROLIA) 60 MG/ML SOSY injection Inject into the skin.    Marland Kitchen Fluticasone-Salmeterol (ADVAIR) 250-50 MCG/DOSE AEPB Inhale 1 puff into the lungs 2 (two) times daily. 60 each 3  . levothyroxine (SYNTHROID) 25 MCG tablet Take 1.5 tab po Daily  before breakfast 135 tablet 3  . lovastatin (MEVACOR) 20 MG tablet Take 1 tablet (20 mg total) by mouth at bedtime. 90 tablet 2  . Multiple Vitamin (MULTI-VITAMINS) TABS Take by mouth.    . mupirocin nasal ointment (BACTROBAN) 2 % Place 1 application into the nose at bedtime. 10 g 1  . Probiotic Product (PROBIOTIC DAILY PO) Take by mouth daily.    . Zinc 25 MG TABS Take by mouth daily.     No current  facility-administered medications for this encounter.    ECOG PERFORMANCE STATUS:  0 - Asymptomatic  REVIEW OF SYSTEMS: Patient denies any weight loss, fatigue, weakness, fever, chills or night sweats. Patient denies any loss of vision, blurred vision. Patient denies any ringing  of the ears or hearing loss. No irregular heartbeat. Patient denies heart murmur or history of fainting. Patient denies any chest pain or pain radiating to her upper extremities. Patient denies any shortness of breath, difficulty breathing at night, cough or hemoptysis. Patient denies any swelling in the lower legs. Patient denies any nausea vomiting, vomiting of blood, or coffee ground material in the vomitus. Patient denies any stomach pain. Patient states has had normal bowel movements no significant constipation or diarrhea. Patient denies any dysuria, hematuria or significant nocturia. Patient denies any problems walking, swelling in the joints or loss of balance. Patient denies any skin changes, loss of hair or loss of weight. Patient denies any excessive worrying or anxiety or significant depression. Patient denies any problems with insomnia. Patient denies excessive thirst, polyuria, polydipsia. Patient denies any swollen glands, patient denies easy bruising or easy bleeding. Patient denies any recent infections, allergies or URI. Patient "s visual fields have not changed significantly in recent time.   PHYSICAL EXAM: BP (!) 175/85   Pulse 75   Wt 98 lb 8 oz (44.7 kg)   BMI 17.73 kg/m  Well-developed well-nourished patient in NAD. HEENT reveals PERLA, EOMI, discs not visualized.  Oral cavity is clear. No oral mucosal lesions are identified. Neck is clear without evidence of cervical or supraclavicular adenopathy. Lungs are clear to A&P. Cardiac examination is essentially unremarkable with regular rate and rhythm without murmur rub or thrill. Abdomen is benign with no organomegaly or masses noted. Motor sensory and DTR  levels are equal and symmetric in the upper and lower extremities. Cranial nerves II through XII are grossly intact. Proprioception is intact. No peripheral adenopathy or edema is identified. No motor or sensory levels are noted. Crude visual fields are within normal range.  LABORATORY DATA: Pathology report reviewed    RADIOLOGY RESULTS: CT scans and PET CT scan reviewed compatible with above-stated findings   IMPRESSION: DCIS of left breast as well as questionable left lower lobe possible low-grade adenocarcinoma in 87 year old female  PLAN: Based on extremely slow growth rate of this lesion as well as not hypermetabolic activity on PET CT scan I believe we can continue to watch her left lower lobe lung lesion and repeat her scans in 3 to 4 months.  Should continue to solidify and grow we may empirically offer SBRT at that time.  She also will need a lumpectomy for DCIS although should her margins be clear I believe she can undergo tamoxifen therapy and based on her age could forego any additional radiation therapy.  Patient husband both seem to comprehend my recommendations well.  I have asked to see her back in 3 months with a repeat CT scan at that time.  She continues close follow-up  care with Dr. Tasia Catchings.  I would like to take this opportunity to thank you for allowing me to participate in the care of your patient.Noreene Filbert, MD

## 2020-11-05 ENCOUNTER — Telehealth: Payer: Self-pay | Admitting: Hospice and Palliative Medicine

## 2020-11-05 ENCOUNTER — Other Ambulatory Visit: Payer: Self-pay

## 2020-11-05 ENCOUNTER — Telehealth: Payer: Self-pay | Admitting: Surgery

## 2020-11-05 ENCOUNTER — Encounter: Payer: Self-pay | Admitting: Surgery

## 2020-11-05 ENCOUNTER — Ambulatory Visit (INDEPENDENT_AMBULATORY_CARE_PROVIDER_SITE_OTHER): Payer: Medicare Other | Admitting: Surgery

## 2020-11-05 ENCOUNTER — Other Ambulatory Visit: Payer: Self-pay | Admitting: Surgery

## 2020-11-05 VITALS — BP 171/79 | HR 71 | Temp 98.3°F | Ht 62.5 in | Wt 97.8 lb

## 2020-11-05 DIAGNOSIS — D0512 Intraductal carcinoma in situ of left breast: Secondary | ICD-10-CM | POA: Diagnosis not present

## 2020-11-05 NOTE — Telephone Encounter (Signed)
Patient has been advised of Pre-Admission date/time, COVID Testing date and Surgery date.  Surgery Date: 11/25/20 Preadmission Testing Date: 11/12/20 (phone 1p-5p) Covid Testing Date: Not needed, patient has been fully vaccinated.    Patient also reminded of her RF tag to be placed at [facility] on 11/19/20 arriving @ 3:30.    Patient has been made aware to call [phone], between 1-3:00pm the day before surgery, to find out what time to arrive for surgery.

## 2020-11-05 NOTE — Progress Notes (Signed)
  Chronic Care Management   Note  11/05/2020 Name: Sabrina Zamora MRN: [id number] DOB: 06-29-1931  Sabrina Zamora is a 87 y.o. year old female who is a primary care patient of Luiz Ochoa, NP. I reached out to [place] by phone today in response to a referral sent by Ms. Sabrina Zamora's PCP, Luiz Ochoa, NP.   Ms. Albano was given information about Chronic Care Management services today including:  1. CCM service includes personalized support from designated clinical staff supervised by her physician, including individualized plan of care and coordination with other care providers 2. 24/7 contact phone numbers for assistance for urgent and routine care needs. 3. Service will only be billed when office clinical staff spend 20 minutes or more in a month to coordinate care. 4. Only one practitioner may furnish and bill the service in a calendar month. 5. The patient may stop CCM services at any time (effective at the end of the month) by phone call to the office staff.   Patient agreed to services and verbal consent obtained.   Follow up plan:   Carley Perdue [employer] Scheduler

## 2020-11-05 NOTE — H&P (View-Only) (Signed)
Outpatient Surgical Follow Up  11/05/2020  Sabrina Zamora is an 87 y.o. female.   Chief Complaint  Patient presents with  . 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  She is very active.  Cognitively she is intact.  She now comes accompanied by her daughter.  Now her pathology shows ER receptor that is positive.  She has been evaluated by Dr. Baruch Gouty from radiation oncology and may not necessarily require adjuvant radiation therapy.  Most of our visit was again used for counseling the patient regarding her disease process.  I once again explained the pathology and updated her about the receptor status.  She is interested in proceeding with lumpectomy  Past Medical History:  Diagnosis Date  . Asthma   . High cholesterol   . Hypertension   . Hypothyroidism   . Osteoporosis   . Skin cancer 2012, 2016    Past Surgical History:  Procedure Laterality Date  . BREAST BIOPSY Left 2003   neg  . BREAST BIOPSY Right 09/27/2017   12:00 1 cmfn fibroadenomatous change   . BREAST BIOPSY Right 09/17/2017   12:00 3 cmfn benign breast and fibroadipose tissue  . BREAST BIOPSY Left 10/14/2020   stereo biopsy, ribbon clip/ path pending  . BREAST EXCISIONAL BIOPSY Left 1988   excisional - neg  . CATARACT EXTRACTION W/PHACO Left 09/20/2017   Procedure: CATARACT EXTRACTION PHACO AND INTRAOCULAR LENS PLACEMENT ([place]) LEFT;  Surgeon: Leandrew Koyanagi, MD;  Location: [place];  Service: Ophthalmology;  Laterality: Left;  . CATARACT EXTRACTION W/PHACO Right 02/15/2018   Procedure: CATARACT EXTRACTION PHACO AND INTRAOCULAR LENS PLACEMENT ([place]) TOPICAL  RIGHT;  Surgeon: Leandrew Koyanagi, MD;  Location: [place];  Service: Ophthalmology;  Laterality: Right;  prefers early  . CHOLECYSTECTOMY      Family History  Problem Relation Age of Onset  . Clotting disorder Sister   .  Breast cancer Sister 29  . Heart disease Mother   . Heart disease Father     Social History:  reports that she has never smoked. She has never used smokeless tobacco. She reports that she does not drink alcohol and does not use drugs.  Allergies:  Allergies  Allergen Reactions  . Levofloxacin Rash and Shortness Of Breath  . Penicillins Hives and Rash  . Tobramycin   . Betaine Rash  . Ciprofloxacin Itching and Rash  . Sulfa Antibiotics Rash    Medications reviewed.    ROS Full ROS performed and is otherwise negative other than what is stated in HPI   BP (!) 171/79   Pulse 71   Temp 98.3 F (36.8 C)   Ht 5' 2.5" (1.588 m)   Wt 44.4 kg   SpO2 98%   BMI 17.60 kg/m   Physical Exam CONSTITUTIONAL: NAD, low BMI EYES: Pupils are equal, round, Sclera are non-icteric. EARS, NOSE, MOUTH AND THROAT: Wearing a mask.Marland Kitchen Hearing is intact to voice. LYMPH NODES:  Lymph nodes in the neck are normal. RESPIRATORY:  Lungs are clear. There is normal respiratory effort, with equal breath sounds bilaterally, and without pathologic use of accessory muscles. CARDIOVASCULAR: Heart is regular without murmurs, gallops, or rubs. BREAST: Left upper outer quadrant healing  biopsy site. No masses, no skin or nipples changes.  Evidence of melanoma resection left post chest wall. Left axillary scar. No palpable breast lesions. GI: The abdomen is  soft, nontender, and nondistended. There are no palpable masses. There is no hepatosplenomegaly. There are normal bowel sounds in all quadrants. GU: Rectal deferred.   MUSCULOSKELETAL: Normal muscle strength and tone. No cyanosis or edema.   SKIN: Turgor is good and there are no pathologic skin lesions or ulcers. NEUROLOGIC: Motor and sensation is grossly normal. Cranial nerves are grossly intact. PSYCH:  Oriented to person, place and time. Affect is normal.    Assessment/Plan:  87 year old very functional female with recently diagnosed intermediate DCIS  ER positive.  I had an extensive discussion once again about her disease process.  She wishes to proceed with lumpectomy.  At this time she wants to  Hold off on potential sentinel lymph excision.  I have disussion with the patient and her daughter regarding the surgery.  We will perform RF guided left lumpectomy.  Procedure discussed with the patient in detail.  Risks, benefits and possible implications including but not limited to: Bleeding, infection, positive margins, of the pathology that may require SLNBx and or re-excision.   Greater than 50% of the 50 minutes  visit was spent in counseling/coordination of care   Caroleen Hamman, MD [place] Surgeon

## 2020-11-05 NOTE — Patient Instructions (Addendum)
Please let us know if you want to have the lymph node sampling done with this surgery.   We have spoken today about removing a lump in your breast. This will be done by Dr. Dahlia Byes at [facility].  You will most likely be able to leave the hospital several hours after your surgery. Rarely, a patient needs to stay over night but this is a possibility.  Plan to tenatively be off work for 1-2 weeks following the surgery and may return with approximately 4 more weeks of a lifting restriction, no greater than 15 lbs.    Lumpectomy A lumpectomy is a form of "breast conserving" or "breast preservation" surgery. It may also be referred to as a partial mastectomy. During a lumpectomy, the portion of the breast that contains the cancerous tumor or breast mass (the lump) is removed. Some normal tissue around the lump may also be removed to make sure all of the tumor has been removed.  LET [facility] CARE PROVIDER KNOW ABOUT:  Any allergies you have.  All medicines you are taking, including vitamins, herbs, eye drops, creams, and over-the-counter medicines.  Previous problems you or members of your family have had with the use of anesthetics.  Any blood disorders you have.  Previous surgeries you have had.  Medical conditions you have. RISKS AND COMPLICATIONS Generally, this is a safe procedure. However, problems can occur and include:  Bleeding.  Infection.  Pain.  Temporary swelling.  Change in the shape of the breast, particularly if a large portion is removed. BEFORE THE PROCEDURE  Ask your health care provider about changing or stopping your regular medicines. This is especially important if you are taking diabetes medicines or blood thinners.  Do not eat or drink anything after midnight on the night before the procedure or as directed by your health care provider. Ask your health care provider if you can take a sip of water with any approved medicines.  On the day of surgery, your health  care provider will use a mammogram or ultrasound to locate and mark the tumor in your breast. These markings on your breast will show where the cut (incision) will be made. PROCEDURE   An IV tube will be put into one of your veins.  You may be given medicine to help you relax before the surgery (sedative). You will be given one of the following:  A medicine that numbs the area (local anesthetic).  A medicine that makes you fall asleep (general anesthetic).  Your health care provider will use a kind of electric scalpel that uses heat to minimize bleeding (electrocautery knife).  A curved incision (like a smile or frown) that follows the natural curve of your breast is made, to allow for minimal scarring and better healing.  The tumor will be removed with some of the surrounding tissue. This will be sent to the lab for analysis. Your health care provider may also remove your lymph nodes at this time if needed.  Sometimes, but not always, a rubber tube called a drain will be surgically inserted into your breast area or armpit to collect excess fluid that may accumulate in the space where the tumor was. This drain is connected to a plastic bulb on the outside of your body. This drain creates suction to help remove the fluid.  The incisions will be closed with stitches (sutures).  A bandage may be placed over the incisions. AFTER THE PROCEDURE  You will be taken to the recovery area.  You will be given medicine for pain.  A small rubber drain may be placed in the breast for 2-3 days to prevent a collection of blood (hematoma) from developing in the breast. You will be given instructions on caring for the drain before you go home.  A pressure bandage (dressing) will be applied for 1-2 days to prevent bleeding. Ask your health care provider how to care for your bandage at home.   This information is not intended to replace advice given to you by your health care provider. Make sure you  discuss any questions you have with your health care provider.   Document Released: 08/09/2006 Document Revised: 07/19/2014 Document Reviewed: 12/01/2012 Elsevier Interactive Patient Education [employer].

## 2020-11-05 NOTE — Progress Notes (Signed)
Outpatient Surgical Follow Up  11/05/2020  Sabrina Zamora is an 87 y.o. female.   Chief Complaint  Patient presents with  . 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  She is very active.  Cognitively she is intact.  She now comes accompanied by her daughter.  Now her pathology shows ER receptor that is positive.  She has been evaluated by Dr. Baruch Gouty from radiation oncology and may not necessarily require adjuvant radiation therapy.  Most of our visit was again used for counseling the patient regarding her disease process.  I once again explained the pathology and updated her about the receptor status.  She is interested in proceeding with lumpectomy  Past Medical History:  Diagnosis Date  . Asthma   . High cholesterol   . Hypertension   . Hypothyroidism   . Osteoporosis   . Skin cancer 2012, 2016    Past Surgical History:  Procedure Laterality Date  . BREAST BIOPSY Left 2003   neg  . BREAST BIOPSY Right 09/27/2017   12:00 1 cmfn fibroadenomatous change   . BREAST BIOPSY Right 09/17/2017   12:00 3 cmfn benign breast and fibroadipose tissue  . BREAST BIOPSY Left 10/14/2020   stereo biopsy, ribbon clip/ path pending  . BREAST EXCISIONAL BIOPSY Left 1988   excisional - neg  . CATARACT EXTRACTION W/PHACO Left 09/20/2017   Procedure: CATARACT EXTRACTION PHACO AND INTRAOCULAR LENS PLACEMENT ([place]) LEFT;  Surgeon: Leandrew Koyanagi, MD;  Location: [place];  Service: Ophthalmology;  Laterality: Left;  . CATARACT EXTRACTION W/PHACO Right 02/15/2018   Procedure: CATARACT EXTRACTION PHACO AND INTRAOCULAR LENS PLACEMENT ([place]) TOPICAL  RIGHT;  Surgeon: Leandrew Koyanagi, MD;  Location: [place];  Service: Ophthalmology;  Laterality: Right;  prefers early  . CHOLECYSTECTOMY      Family History  Problem Relation Age of Onset  . Clotting disorder Sister   .  Breast cancer Sister 43  . Heart disease Mother   . Heart disease Father     Social History:  reports that she has never smoked. She has never used smokeless tobacco. She reports that she does not drink alcohol and does not use drugs.  Allergies:  Allergies  Allergen Reactions  . Levofloxacin Rash and Shortness Of Breath  . Penicillins Hives and Rash  . Tobramycin   . Betaine Rash  . Ciprofloxacin Itching and Rash  . Sulfa Antibiotics Rash    Medications reviewed.    ROS Full ROS performed and is otherwise negative other than what is stated in HPI   BP (!) 171/79   Pulse 71   Temp 98.3 F (36.8 C)   Ht 5' 2.5" (1.588 m)   Wt 44.4 kg   SpO2 98%   BMI 17.60 kg/m   Physical Exam CONSTITUTIONAL: NAD, low BMI EYES: Pupils are equal, round, Sclera are non-icteric. EARS, NOSE, MOUTH AND THROAT: Wearing a mask.Marland Kitchen Hearing is intact to voice. LYMPH NODES:  Lymph nodes in the neck are normal. RESPIRATORY:  Lungs are clear. There is normal respiratory effort, with equal breath sounds bilaterally, and without pathologic use of accessory muscles. CARDIOVASCULAR: Heart is regular without murmurs, gallops, or rubs. BREAST: Left upper outer quadrant healing  biopsy site. No masses, no skin or nipples changes.  Evidence of melanoma resection left post chest wall. Left axillary scar. No palpable breast lesions. GI: The abdomen is  soft, nontender, and nondistended. There are no palpable masses. There is no hepatosplenomegaly. There are normal bowel sounds in all quadrants. GU: Rectal deferred.   MUSCULOSKELETAL: Normal muscle strength and tone. No cyanosis or edema.   SKIN: Turgor is good and there are no pathologic skin lesions or ulcers. NEUROLOGIC: Motor and sensation is grossly normal. Cranial nerves are grossly intact. PSYCH:  Oriented to person, place and time. Affect is normal.    Assessment/Plan:  87 year old very functional female with recently diagnosed intermediate DCIS  ER positive.  I had an extensive discussion once again about her disease process.  She wishes to proceed with lumpectomy.  At this time she wants to  Hold off on potential sentinel lymph excision.  I have disussion with the patient and her daughter regarding the surgery.  We will perform RF guided left lumpectomy.  Procedure discussed with the patient in detail.  Risks, benefits and possible implications including but not limited to: Bleeding, infection, positive margins, of the pathology that may require SLNBx and or re-excision.   Greater than 50% of the 50 minutes  visit was spent in counseling/coordination of care   Caroleen Hamman, MD [place] Surgeon

## 2020-11-06 ENCOUNTER — Telehealth: Payer: Self-pay | Admitting: Surgery

## 2020-11-06 NOTE — Telephone Encounter (Signed)
Patient is having lumpectomy done on 11/25/20 with Dr. Dahlia Byes.  Patient states that she was told to call back and let us know if she wants the lymph node biopsy done and per patient "she states they do not want this done".  She just wanted to call and let us know that.

## 2020-11-07 ENCOUNTER — Ambulatory Visit: Payer: Medicare Other | Admitting: Hospice and Palliative Medicine

## 2020-11-07 ENCOUNTER — Encounter: Payer: Self-pay | Admitting: Hospice and Palliative Medicine

## 2020-11-12 ENCOUNTER — Other Ambulatory Visit: Payer: Self-pay

## 2020-11-12 ENCOUNTER — Encounter
Admission: RE | Admit: 2020-11-12 | Discharge: 2020-11-12 | Disposition: A | Discharge status: Home / Self Care | Payer: Medicare Other | Source: Ambulatory Visit | Attending: Surgery | Admitting: Surgery

## 2020-11-12 HISTORY — DX: Gastro-esophageal reflux disease without esophagitis: K21.9

## 2020-11-12 HISTORY — DX: Polycythemia vera: D45

## 2020-11-12 HISTORY — DX: Sleep apnea, unspecified: G47.30

## 2020-11-12 HISTORY — DX: Malignant melanoma of skin, unspecified: C43.9

## 2020-11-12 NOTE — Patient Instructions (Addendum)
Your procedure is scheduled on:  Tuesday, May 17 Report to the Registration Desk on the 1st floor of the [facility]. To find out your arrival time, please call [phone] between 1PM - 3PM on: Monday, May 16  REMEMBER: Instructions that are not followed completely may result in serious medical risk, up to and including death; or upon the discretion of your surgeon and anesthesiologist your surgery may need to be rescheduled.  Do not eat food after midnight the night before surgery.  No gum chewing, lozengers or hard candies.  You may however, drink CLEAR liquids up to 2 hours before you are scheduled to arrive for your surgery. Do not drink anything within 2 hours of your scheduled arrival time.  Clear liquids include: - water  - apple juice without pulp - gatorade (not RED, PURPLE, OR BLUE) - black coffee or tea (Do NOT add milk or creamers to the coffee or tea) Do NOT drink anything that is not on this list.  TAKE THESE MEDICATIONS THE MORNING OF SURGERY WITH A SIP OF WATER:  1.  Amlodipine 2.  advair inhaler 3.  Levothyroxine  Use inhalers on the day of surgery and bring to the hospital.  One week prior to surgery: starting May 10 Stop ASPIRIN and Anti-inflammatories (NSAIDS) such as Advil, Aleve, Ibuprofen, Motrin, Naproxen, Naprosyn and Aspirin based products such as Excedrin, Goodys Powder, BC Powder. Stop ANY OVER THE COUNTER supplements until after surgery including vitamin D, multi-vitamin, zinc.  No Alcohol for 24 hours before or after surgery.  On the morning of surgery brush your teeth with toothpaste and water, you may rinse your mouth with mouthwash if you wish. Do not swallow any toothpaste or mouthwash.  Do not wear jewelry, make-up, hairpins, clips or nail polish.  Do not wear lotions, powders, or perfumes.   Do not shave body from the neck down 48 hours prior to surgery just in case you cut yourself which could leave a site for infection.  Also,  freshly shaved skin may become irritated if using the CHG soap.  Contact lenses, hearing aids and dentures may not be worn into surgery.  Do not bring valuables to the hospital. [facility] is not responsible for any missing/lost belongings or valuables.   Use CHG Soap as directed on instruction sheet.  Notify your doctor if there is any change in your medical condition (cold, fever, infection).  Wear comfortable clothing (specific to your surgery type) to the hospital.  Plan for stool softeners for home use; pain medications have a tendency to cause constipation. You can also help prevent constipation by eating foods high in fiber such as fruits and vegetables and drinking plenty of fluids as your diet allows.  After surgery, you can help prevent lung complications by doing breathing exercises.  Take deep breaths and cough every 1-2 hours. Your doctor may order a device called an Incentive Spirometer to help you take deep breaths.  If you are being discharged the day of surgery, you will not be allowed to drive home. You will need a responsible adult (18 years or older) to drive you home and stay with you that night.   If you are taking public transportation, you will need to have a responsible adult (18 years or older) with you. Please confirm with your physician that it is acceptable to use public transportation.   Please call the [place] Dept. at [phone] if you have any questions about these instructions.  Surgery  Visitation Policy:  Patients undergoing a surgery or procedure may have one family member or support person with them as long as that person is not COVID-19 positive or experiencing its symptoms.  That person may remain in the waiting area during the procedure.

## 2020-11-13 ENCOUNTER — Telehealth: Payer: Self-pay

## 2020-11-13 ENCOUNTER — Encounter
Admission: RE | Admit: 2020-11-13 | Discharge: 2020-11-13 | Disposition: A | Discharge status: Home / Self Care | Payer: Medicare Other | Source: Ambulatory Visit | Attending: Surgery | Admitting: Surgery

## 2020-11-13 DIAGNOSIS — Z01810 Encounter for preprocedural cardiovascular examination: Secondary | ICD-10-CM | POA: Diagnosis not present

## 2020-11-13 DIAGNOSIS — Z01818 Encounter for other preprocedural examination: Secondary | ICD-10-CM | POA: Diagnosis not present

## 2020-11-13 DIAGNOSIS — I10 Essential (primary) hypertension: Secondary | ICD-10-CM | POA: Diagnosis not present

## 2020-11-13 LAB — CBC
HCT: 47 % — ABNORMAL HIGH (ref 36.0–46.0)
Hemoglobin: 15.8 g/dL — ABNORMAL HIGH (ref 12.0–15.0)
MCH: 32 pg (ref 26.0–34.0)
MCHC: 33.6 g/dL (ref 30.0–36.0)
MCV: 95.1 fL (ref 80.0–100.0)
Platelets: 227 10*3/uL (ref 150–400)
RBC: 4.94 MIL/uL (ref 3.87–5.11)
RDW: 13.7 % (ref 11.5–15.5)
WBC: 6.3 10*3/uL (ref 4.0–10.5)
nRBC: 0 % (ref 0.0–0.2)

## 2020-11-13 LAB — BASIC METABOLIC PANEL
Anion gap: 8 (ref 5–15)
BUN: 18 mg/dL (ref 8–23)
CO2: 30 mmol/L (ref 22–32)
Calcium: 9.4 mg/dL (ref 8.9–10.3)
Chloride: 104 mmol/L (ref 98–111)
Creatinine, Ser: 0.72 mg/dL (ref 0.44–1.00)
GFR, Estimated: >60 mL/min (ref >60)
Glucose, Bld: 91 mg/dL (ref 70–99)
Potassium: 4.6 mmol/L (ref 3.5–5.1)
Sodium: 142 mmol/L (ref 135–145)

## 2020-11-13 NOTE — Telephone Encounter (Signed)
Pt notified of normal lab results. Verbalizes understanding.

## 2020-11-17 ENCOUNTER — Other Ambulatory Visit: Payer: Self-pay | Admitting: Nurse Practitioner

## 2020-11-19 ENCOUNTER — Other Ambulatory Visit: Payer: Self-pay

## 2020-11-19 ENCOUNTER — Ambulatory Visit
Admission: RE | Admit: 2020-11-19 | Discharge: 2020-11-19 | Disposition: A | Discharge status: Home / Self Care | Payer: Medicare Other | Source: Ambulatory Visit | Attending: Surgery | Admitting: Surgery

## 2020-11-19 DIAGNOSIS — D0512 Intraductal carcinoma in situ of left breast: Secondary | ICD-10-CM | POA: Diagnosis not present

## 2020-11-19 DIAGNOSIS — D225 Melanocytic nevi of trunk: Secondary | ICD-10-CM | POA: Diagnosis not present

## 2020-11-19 DIAGNOSIS — Z08 Encounter for follow-up examination after completed treatment for malignant neoplasm: Secondary | ICD-10-CM | POA: Diagnosis not present

## 2020-11-19 DIAGNOSIS — Z85828 Personal history of other malignant neoplasm of skin: Secondary | ICD-10-CM | POA: Diagnosis not present

## 2020-11-19 DIAGNOSIS — Z85820 Personal history of malignant melanoma of skin: Secondary | ICD-10-CM | POA: Diagnosis not present

## 2020-11-19 DIAGNOSIS — C50912 Malignant neoplasm of unspecified site of left female breast: Secondary | ICD-10-CM | POA: Diagnosis not present

## 2020-11-19 DIAGNOSIS — L821 Other seborrheic keratosis: Secondary | ICD-10-CM | POA: Diagnosis not present

## 2020-11-24 ENCOUNTER — Telehealth: Payer: Self-pay

## 2020-11-24 DIAGNOSIS — D751 Secondary polycythemia: Secondary | ICD-10-CM

## 2020-11-24 NOTE — Telephone Encounter (Signed)
Pt will be having breast lumpectomy on 5/17. Please schedule lab (HH)/MD/ Phlebotomy- 2 weeks after surgery and notify pt of appts.

## 2020-11-25 ENCOUNTER — Other Ambulatory Visit: Payer: Self-pay

## 2020-11-25 ENCOUNTER — Ambulatory Visit
Admission: RE | Admit: 2020-11-25 | Discharge: 2020-11-25 | Disposition: A | Discharge status: Home / Self Care | Payer: Medicare Other | Attending: Surgery | Admitting: Surgery

## 2020-11-25 ENCOUNTER — Ambulatory Visit: Payer: Medicare Other | Admitting: Urgent Care

## 2020-11-25 ENCOUNTER — Encounter: Payer: Self-pay | Admitting: Surgery

## 2020-11-25 ENCOUNTER — Encounter
Admission: RE | Disposition: A | Discharge status: Home / Self Care | Payer: Self-pay | Source: Home / Self Care | Attending: Surgery

## 2020-11-25 ENCOUNTER — Ambulatory Visit
Admission: RE | Admit: 2020-11-25 | Discharge: 2020-11-25 | Disposition: A | Discharge status: Home / Self Care | Payer: Medicare Other | Source: Ambulatory Visit | Attending: Surgery | Admitting: Surgery

## 2020-11-25 DIAGNOSIS — Z803 Family history of malignant neoplasm of breast: Secondary | ICD-10-CM | POA: Diagnosis not present

## 2020-11-25 DIAGNOSIS — Z880 Allergy status to penicillin: Secondary | ICD-10-CM | POA: Insufficient documentation

## 2020-11-25 DIAGNOSIS — D0512 Intraductal carcinoma in situ of left breast: Secondary | ICD-10-CM

## 2020-11-25 DIAGNOSIS — Z888 Allergy status to other drugs, medicaments and biological substances status: Secondary | ICD-10-CM | POA: Insufficient documentation

## 2020-11-25 DIAGNOSIS — C50412 Malignant neoplasm of upper-outer quadrant of left female breast: Secondary | ICD-10-CM | POA: Insufficient documentation

## 2020-11-25 DIAGNOSIS — Z881 Allergy status to other antibiotic agents status: Secondary | ICD-10-CM | POA: Diagnosis not present

## 2020-11-25 DIAGNOSIS — Z882 Allergy status to sulfonamides status: Secondary | ICD-10-CM | POA: Insufficient documentation

## 2020-11-25 DIAGNOSIS — Z170 Estrogen receptor positive status [ER+]: Secondary | ICD-10-CM | POA: Diagnosis not present

## 2020-11-25 DIAGNOSIS — Z85828 Personal history of other malignant neoplasm of skin: Secondary | ICD-10-CM | POA: Insufficient documentation

## 2020-11-25 DIAGNOSIS — R928 Other abnormal and inconclusive findings on diagnostic imaging of breast: Secondary | ICD-10-CM | POA: Diagnosis not present

## 2020-11-25 HISTORY — PX: BREAST LUMPECTOMY: SHX2

## 2020-11-25 HISTORY — PX: BREAST LUMPECTOMY WITH RADIOFREQUENCY TAG IDENTIFICATION: SHX6884

## 2020-11-25 SURGERY — BREAST LUMPECTOMY WITH RADIOFREQUENCY TAG IDENTIFICATION
Anesthesia: General | Site: Breast | Laterality: Left

## 2020-11-25 MED ORDER — SODIUM CHLORIDE FLUSH 0.9 % IV SOLN
INTRAVENOUS | Status: AC
  Filled 2020-11-25: qty 20

## 2020-11-25 MED ORDER — GABAPENTIN 300 MG PO CAPS
ORAL_CAPSULE | ORAL | Status: AC
  Administered 2020-11-25: 300 mg via ORAL
  Filled 2020-11-25: qty 1

## 2020-11-25 MED ORDER — CHLORHEXIDINE GLUCONATE CLOTH 2 % EX PADS
6.0000 | MEDICATED_PAD | Freq: Once | CUTANEOUS | Status: AC
  Administered 2020-11-25: 6 via TOPICAL

## 2020-11-25 MED ORDER — DEXAMETHASONE SODIUM PHOSPHATE 10 MG/ML IJ SOLN
INTRAMUSCULAR | Status: DC | PRN
  Administered 2020-11-25: 5 mg via INTRAVENOUS

## 2020-11-25 MED ORDER — OXYCODONE HCL 5 MG/5ML PO SOLN: 5.0000 mg | Freq: Once | ORAL | Status: DC | PRN

## 2020-11-25 MED ORDER — TRAMADOL HCL 50 MG PO TABS
ORAL_TABLET | ORAL | Status: AC
  Administered 2020-11-25: 50 mg via ORAL
  Filled 2020-11-25: qty 1

## 2020-11-25 MED ORDER — PROPOFOL 10 MG/ML IV BOLUS
INTRAVENOUS | Status: AC
  Filled 2020-11-25: qty 20

## 2020-11-25 MED ORDER — CEFAZOLIN SODIUM-DEXTROSE 2-4 GM/100ML-% IV SOLN
INTRAVENOUS | Status: AC
  Filled 2020-11-25: qty 100

## 2020-11-25 MED ORDER — FENTANYL CITRATE (PF) 100 MCG/2ML IJ SOLN
INTRAMUSCULAR | Status: AC
  Filled 2020-11-25: qty 2

## 2020-11-25 MED ORDER — OXYCODONE HCL 5 MG PO TABS: 5.0000 mg | ORAL_TABLET | Freq: Once | ORAL | Status: DC | PRN

## 2020-11-25 MED ORDER — FENTANYL CITRATE (PF) 100 MCG/2ML IJ SOLN
INTRAMUSCULAR | Status: DC | PRN
  Administered 2020-11-25 (×2): 25 ug via INTRAVENOUS

## 2020-11-25 MED ORDER — CHLORHEXIDINE GLUCONATE 0.12 % MT SOLN: 15.0000 mL | Freq: Once | OROMUCOSAL | Status: AC

## 2020-11-25 MED ORDER — TRAMADOL HCL 50 MG PO TABS: 50.0000 mg | ORAL_TABLET | Freq: Once | ORAL | Status: AC

## 2020-11-25 MED ORDER — BUPIVACAINE LIPOSOME 1.3 % IJ SUSP
INTRAMUSCULAR | Status: DC | PRN
  Administered 2020-11-25: 20 mL

## 2020-11-25 MED ORDER — BUPIVACAINE-EPINEPHRINE (PF) 0.25% -1:200000 IJ SOLN
INTRAMUSCULAR | Status: AC
  Filled 2020-11-25: qty 30

## 2020-11-25 MED ORDER — PROPOFOL 10 MG/ML IV BOLUS
INTRAVENOUS | Status: DC | PRN
  Administered 2020-11-25 (×2): 20 mg via INTRAVENOUS
  Administered 2020-11-25: 110 mg via INTRAVENOUS

## 2020-11-25 MED ORDER — BUPIVACAINE-EPINEPHRINE 0.25% -1:200000 IJ SOLN
INTRAMUSCULAR | Status: DC | PRN
  Administered 2020-11-25: 30 mL

## 2020-11-25 MED ORDER — EPHEDRINE SULFATE 50 MG/ML IJ SOLN
INTRAMUSCULAR | Status: DC | PRN
  Administered 2020-11-25: 10 mg via INTRAVENOUS

## 2020-11-25 MED ORDER — GABAPENTIN 300 MG PO CAPS: 300.0000 mg | ORAL_CAPSULE | ORAL | Status: AC

## 2020-11-25 MED ORDER — BUPIVACAINE LIPOSOME 1.3 % IJ SUSP
INTRAMUSCULAR | Status: AC
  Filled 2020-11-25: qty 20

## 2020-11-25 MED ORDER — ACETAMINOPHEN 500 MG PO TABS: 1000.0000 mg | ORAL_TABLET | ORAL | Status: AC

## 2020-11-25 MED ORDER — PHENYLEPHRINE HCL-NACL 20-0.9 MG/250ML-% IV SOLN
INTRAVENOUS | Status: DC | PRN
  Administered 2020-11-25: 30 ug/min via INTRAVENOUS

## 2020-11-25 MED ORDER — ONDANSETRON HCL 4 MG/2ML IJ SOLN
INTRAMUSCULAR | Status: DC | PRN
  Administered 2020-11-25: 4 mg via INTRAVENOUS

## 2020-11-25 MED ORDER — ORAL CARE MOUTH RINSE: 15.0000 mL | Freq: Once | OROMUCOSAL | Status: AC

## 2020-11-25 MED ORDER — CHLORHEXIDINE GLUCONATE 0.12 % MT SOLN
OROMUCOSAL | Status: AC
  Administered 2020-11-25: 15 mL via OROMUCOSAL
  Filled 2020-11-25: qty 15

## 2020-11-25 MED ORDER — LACTATED RINGERS IV SOLN: INTRAVENOUS | Status: DC

## 2020-11-25 MED ORDER — ONDANSETRON HCL 4 MG/2ML IJ SOLN: 4.0000 mg | Freq: Once | INTRAMUSCULAR | Status: DC | PRN

## 2020-11-25 MED ORDER — HYDROCODONE-ACETAMINOPHEN 5-325 MG PO TABS: 1.0000 | ORAL_TABLET | Freq: Four times a day (QID) | ORAL | 0 refills | Status: DC | PRN

## 2020-11-25 MED ORDER — PHENYLEPHRINE HCL (PRESSORS) 10 MG/ML IV SOLN
INTRAVENOUS | Status: DC | PRN
  Administered 2020-11-25: 100 ug via INTRAVENOUS

## 2020-11-25 MED ORDER — FAMOTIDINE 20 MG PO TABS: 20.0000 mg | ORAL_TABLET | Freq: Once | ORAL | Status: AC

## 2020-11-25 MED ORDER — ACETAMINOPHEN 500 MG PO TABS
ORAL_TABLET | ORAL | Status: AC
  Administered 2020-11-25: 1000 mg via ORAL
  Filled 2020-11-25: qty 2

## 2020-11-25 MED ORDER — FAMOTIDINE 20 MG PO TABS
ORAL_TABLET | ORAL | Status: AC
  Administered 2020-11-25: 20 mg via ORAL
  Filled 2020-11-25: qty 1

## 2020-11-25 MED ORDER — LIDOCAINE HCL (CARDIAC) PF 100 MG/5ML IV SOSY
PREFILLED_SYRINGE | INTRAVENOUS | Status: DC | PRN
  Administered 2020-11-25: 100 mg via INTRAVENOUS

## 2020-11-25 MED ORDER — FENTANYL CITRATE (PF) 100 MCG/2ML IJ SOLN: 25.0000 ug | INTRAMUSCULAR | Status: DC | PRN

## 2020-11-25 MED ORDER — CEFAZOLIN SODIUM-DEXTROSE 2-4 GM/100ML-% IV SOLN
2.0000 g | INTRAVENOUS | Status: AC
  Administered 2020-11-25: 2 g via INTRAVENOUS

## 2020-11-25 SURGICAL SUPPLY — 39 items
ADH SKN CLS APL DERMABOND .7 (GAUZE/BANDAGES/DRESSINGS) ×1
APL PRP STRL LF DISP 70% ISPRP (MISCELLANEOUS) ×1
APPLIER CLIP 9.375 SM OPEN (CLIP)
APR CLP SM 9.3 20 MLT OPN (CLIP)
BINDER BREAST SMALL (GAUZE/BANDAGES/DRESSINGS) ×1 IMPLANT
CANISTER SUCT 1200ML W/VALVE (MISCELLANEOUS) ×1 IMPLANT
CHLORAPREP W/TINT 26 (MISCELLANEOUS) ×2 IMPLANT
CLIP APPLIE 9.375 SM OPEN (CLIP) IMPLANT
COVER WAND RF STERILE (DRAPES) ×2 IMPLANT
DERMABOND ADVANCED (GAUZE/BANDAGES/DRESSINGS) ×1
DERMABOND ADVANCED .7 DNX12 (GAUZE/BANDAGES/DRESSINGS) ×1 IMPLANT
DEVICE DUBIN SPECIMEN MAMMOGRA (MISCELLANEOUS) ×2 IMPLANT
DRAPE CHEST BREAST 77X106 FENE (MISCELLANEOUS) ×2 IMPLANT
DRSG GAUZE FLUFF 36X18 (GAUZE/BANDAGES/DRESSINGS) ×1 IMPLANT
ELECT CAUTERY BLADE 6.4 (BLADE) ×2 IMPLANT
ELECT REM PT RETURN 9FT ADLT (ELECTROSURGICAL) ×2
ELECTRODE REM PT RTRN 9FT ADLT (ELECTROSURGICAL) ×1 IMPLANT
GLOVE SURG ENC MOIS LTX SZ7 (GLOVE) ×2 IMPLANT
GLOVE SURG UNDER LTX SZ7.5 (GLOVE) ×2 IMPLANT
GOWN STRL REUS W/ TWL LRG LVL3 (GOWN DISPOSABLE) ×2 IMPLANT
GOWN STRL REUS W/TWL LRG LVL3 (GOWN DISPOSABLE) ×4
KIT MARKER MARGIN INK (KITS) ×1 IMPLANT
KIT TURNOVER KIT A (KITS) ×2 IMPLANT
MANIFOLD NEPTUNE II (INSTRUMENTS) ×2 IMPLANT
MARGIN MAP 10MM (MISCELLANEOUS) IMPLANT
MARKER MARGIN CORRECT CLIP (MARKER) IMPLANT
NEEDLE HYPO 22GX1.5 SAFETY (NEEDLE) ×2 IMPLANT
PACK BASIN MINOR ARMC (MISCELLANEOUS) ×2 IMPLANT
SET LOCALIZER 20 PROBE US (MISCELLANEOUS) ×2 IMPLANT
SPONGE LAP 18X18 RF (DISPOSABLE) ×2 IMPLANT
SUT MNCRL 4-0 (SUTURE) ×4
SUT MNCRL 4-0 27XMFL (SUTURE) ×2
SUT SILK 2 0 SH (SUTURE) ×1 IMPLANT
SUT VIC AB 2-0 SH 27 (SUTURE) ×8
SUT VIC AB 2-0 SH 27XBRD (SUTURE) ×2 IMPLANT
SUT VIC AB 3-0 SH 27 (SUTURE) ×4
SUT VIC AB 3-0 SH 27X BRD (SUTURE) ×2 IMPLANT
SUTURE MNCRL 4-0 27XMF (SUTURE) ×2 IMPLANT
WATER STERILE IRR 1000ML POUR (IV SOLUTION) ×2 IMPLANT

## 2020-11-25 NOTE — Anesthesia Preprocedure Evaluation (Signed)
Anesthesia Evaluation  Patient identified by MRN, date of birth, ID band Patient awake    Reviewed: Allergy & Precautions, NPO status , Patient's Chart, lab work & pertinent test results  History of Anesthesia Complications Negative for: history of anesthetic complications  Airway Mallampati: III  TM Distance: >3 FB Neck ROM: Full    Dental  (+) Poor Dentition   Pulmonary asthma (daily controller) , neg sleep apnea, COPD,    breath sounds clear to auscultation- rhonchi (-) wheezing      Cardiovascular hypertension, Pt. on medications (-) angina(-) CAD, (-) Past MI and (-) Cardiac Stents  Rhythm:Regular Rate:Normal - Systolic murmurs and - Diastolic murmurs    Neuro/Psych neg Seizures PSYCHIATRIC DISORDERS Anxiety negative neurological ROS     GI/Hepatic Neg liver ROS, GERD  ,  Endo/Other  neg diabetesHypothyroidism   Renal/GU negative Renal ROS     Musculoskeletal negative musculoskeletal ROS (+)   Abdominal (+) - obese,   Peds  Hematology negative hematology ROS (+)   Anesthesia Other Findings Past Medical History: No date: Asthma No date: GERD (gastroesophageal reflux disease) No date: High cholesterol No date: Hypertension No date: Hypothyroidism 1986, 2008, 2016, 2021: Melanoma ([place])     Comment:  melenoma left arm, left leg, back, back respectively No date: Osteoporosis No date: Polycythemia vera ([place]) 2012, 2016: Skin cancer No date: Sleep apnea   Reproductive/Obstetrics                             Anesthesia Physical Anesthesia Plan  ASA: II  Anesthesia Plan: General   Post-op Pain Management:    Induction: Intravenous  PONV Risk Score and Plan: 2 and Ondansetron and Dexamethasone  Airway Management Planned: LMA  Additional Equipment:   Intra-op Plan:   Post-operative Plan:   Informed Consent: I have reviewed the patients History and Physical, chart, labs  and discussed the procedure including the risks, benefits and alternatives for the proposed anesthesia with the patient or authorized representative who has indicated his/her understanding and acceptance.     Dental advisory given  Plan Discussed with: CRNA and Anesthesiologist  Anesthesia Plan Comments:         Anesthesia Quick Evaluation

## 2020-11-25 NOTE — Discharge Instructions (Signed)
Bupivacaine Liposomal Suspension for Injection What is this medicine? BUPIVACAINE LIPOSOMAL (bue PIV a kane LIP oh som al) is an anesthetic. It causes loss of feeling in the skin or other tissues. It is used to prevent and to treat pain from some procedures. This medicine may be used for other purposes; ask your health care provider or pharmacist if you have questions. COMMON BRAND NAME(S): EXPAREL What should I tell my health care provider before I take this medicine? They need to know if you have any of these conditions:  G6PD deficiency  heart disease  kidney disease  liver disease  low blood pressure  lung or breathing disease, like asthma  an unusual or allergic reaction to bupivacaine, other medicines, foods, dyes, or preservatives  pregnant or trying to get pregnant  breast-feeding How should I use this medicine? This medicine is injected into the affected area. It is given by a health care provider in a hospital or clinic setting. Talk to your health care provider about the use of this medicine in children. While it may be given to children as young as 6 years for selected conditions, precautions do apply. Overdosage: If you think you have taken too much of this medicine contact a poison control center or emergency room at once. NOTE: This medicine is only for you. Do not share this medicine with others. What if I miss a dose? This does not apply. What may interact with this medicine? This medicine may interact with the following medications:  acetaminophen  certain antibiotics like dapsone, nitrofurantoin, aminosalicylic acid, sulfonamides  certain medicines for seizures like phenobarbital, phenytoin, valproic acid  chloroquine  cyclophosphamide  flutamide  hydroxyurea  ifosfamide  metoclopramide  nitric oxide  nitroglycerin  nitroprusside  nitrous oxide  other local anesthetics like lidocaine, pramoxine,  tetracaine  primaquine  quinine  rasburicase  sulfasalazine This list may not describe all possible interactions. Give your health care provider a list of all the medicines, herbs, non-prescription drugs, or dietary supplements you use. Also tell them if you smoke, drink alcohol, or use illegal drugs. Some items may interact with your medicine. What should I watch for while using this medicine? Your condition will be monitored carefully while you are receiving this medicine. Be careful to avoid injury while the area is numb, and you are not aware of pain. What side effects may I notice from receiving this medicine? Side effects that you should report to your doctor or health care professional as soon as possible:  allergic reactions like skin rash, itching or hives, swelling of the face, lips, or tongue  seizures  signs and symptoms of a dangerous change in heartbeat or heart rhythm like chest pain; dizziness; fast, irregular heartbeat; palpitations; feeling faint or lightheaded; falls; breathing problems  signs and symptoms of methemoglobinemia such as pale, gray, or blue colored skin; headache; fast heartbeat; shortness of breath; feeling faint or lightheaded, falls; tiredness Side effects that usually do not require medical attention (report to your doctor or health care professional if they continue or are bothersome):  anxious  back pain  changes in taste  changes in vision  constipation  dizziness  fever  nausea, vomiting This list may not describe all possible side effects. Call your doctor for medical advice about side effects. You may report side effects to FDA at 1-800-FDA-1088. Where should I keep my medicine? This drug is given in a hospital or clinic and will not be stored at home. NOTE: This sheet is a  summary. It may not cover all possible information. If you have questions about this medicine, talk to your doctor, pharmacist, or health care provider.  2021  Elsevier/Gold Standard (2019-10-04 12:24:57)       AMBULATORY SURGERY  DISCHARGE INSTRUCTIONS   1) The drugs that you were given will stay in your system until tomorrow so for the next 24 hours you should not:  A) Drive an automobile B) Make any legal decisions C) Drink any alcoholic beverage   2) You may resume regular meals tomorrow.  Today it is better to start with liquids and gradually work up to solid foods.  You may eat anything you prefer, but it is better to start with liquids, then soup and crackers, and gradually work up to solid foods.   3) Please notify your doctor immediately if you have any unusual bleeding, trouble breathing, redness and pain at the surgery site, drainage, fever, or pain not relieved by medication.    4) Additional Instructions:        Please contact your physician with any problems or Same Day Surgery at [phone], Monday through Friday 6 am to 4 pm, or [place] [facility] number at [phone].

## 2020-11-25 NOTE — Interval H&P Note (Signed)
History and Physical Interval Note:  11/25/2020 7:19 AM  Sabrina Zamora  has presented today for surgery, with the diagnosis of Left DCIS.  The various methods of treatment have been discussed with the patient and family. After consideration of risks, benefits and other options for treatment, the patient has consented to  Procedure(s): BREAST LUMPECTOMY WITH RADIOFREQUENCY TAG IDENTIFICATION (Left) as a surgical intervention.  The patient's history has been reviewed, patient examined, no change in status, stable for surgery.  I have reviewed the patient's chart and labs.  Questions were answered to the patient's satisfaction.     [place]

## 2020-11-25 NOTE — Anesthesia Procedure Notes (Signed)
Procedure Name: LMA Insertion Date/Time: 11/25/2020 7:45 AM Performed by: Allean Found, CRNA Pre-anesthesia Checklist: Patient identified, Patient being monitored, Timeout performed, Emergency Drugs available and Suction available Patient Re-evaluated:Patient Re-evaluated prior to induction Oxygen Delivery Method: Circle system utilized Preoxygenation: Pre-oxygenation with 100% oxygen Induction Type: IV induction Ventilation: Mask ventilation without difficulty LMA: LMA inserted LMA Size: 4.0 Tube type: Oral Number of attempts: 1 Placement Confirmation: positive ETCO2 and breath sounds checked- equal and bilateral Tube secured with: Tape Dental Injury: Teeth and Oropharynx as per pre-operative assessment

## 2020-11-25 NOTE — Transfer of Care (Signed)
Immediate Anesthesia Transfer of Care Note  Patient: Sabrina Zamora  Procedure(s) Performed: BREAST LUMPECTOMY WITH RADIOFREQUENCY TAG IDENTIFICATION (Left Breast)  Patient Location: PACU  Anesthesia Type:General  Level of Consciousness: awake  Airway & Oxygen Therapy: Patient Spontanous Breathing and Patient connected to face mask oxygen  Post-op Assessment: Report given to RN and Post -op Vital signs reviewed and stable  Post vital signs: Reviewed and stable  Last Vitals:  Vitals Value Taken Time  BP 129/69 11/25/20 0916  Temp 36.3 C 11/25/20 0915  Pulse 81 11/25/20 0922  Resp 17 11/25/20 0922  SpO2 100 % 11/25/20 0922  Vitals shown include unvalidated device data.  Last Pain:  Vitals:   11/25/20 0915  TempSrc:   PainSc: 0-No pain         Complications: No complications documented.

## 2020-11-25 NOTE — Op Note (Signed)
Pre-operative Diagnosis:Left  Breast Cancer DCIS upper outer quadrant   Post-operative Diagnosis: Same   Surgeon: Caroleen Hamman,  MD FACS  Anesthesia: GETA  Procedure: Left Partial mastectomy including margins RFA guided   Complex closure with tissue rearrangements measuring 64cm2   Findings: Clips and tag withinxray specimen   Estimated Blood Loss: Minimal         Drains: None         Specimens: partial mastectomy with labels, Sentinel node        Complications: none                 Condition: Stable    Procedure Details  The patient was seen again in the Holding Room. The benefits, complications, treatment options, and expected outcomes were discussed with the patient. The risks of bleeding, infection, recurrence of symptoms, failure to resolve symptoms, hematoma, seroma, open wound, cosmetic deformity, and the need for further surgery were discussed.  The patient was taken to Operating Room, identified  and the procedure verified.  A Time Out was held and the above information confirmed. I did offer her SLNBx but she wished to proceed only with lumpectomy at this time. Prior to the induction of general anesthesia, antibiotic prophylaxis was administered. VTE prophylaxis was in place. Appropriate anesthesia was then administered and tolerated well. The chest was prepped with Chloraprep and draped in the sterile fashion. The patient was positioned in the supine position.  Attention was turned to the left breast and RFA localizer was used to identify the location of the tad, an incision was made encompassing the prior biopsy site in a crescent moon shaped fashion. Dissection around the tag to perform a partial mastectomy with adequate margins was performed. This was done with electrocautery and sharp dissection. Hemostasis was with electrocautery.  Tissue advancement flaps were performed to decrease the volume deficit in the area of the resection.  Please note that the total void  area was 8 x 8 cm equals 64 cm.  The chest wall flaps were created by incising the breast parenchyma from the pectoralis fascia in a circumferential method.  The breast parenchyma was then reapproximated in a deep to superficial fashion using interrupted 2-0 Vicryl sutures.  Please note that I placed 2 deep layers of 2-0 Vicryl's. Liposomal marcaine was used to perform several intercostal nerve blocks Once assuring that hemostasis was adequate and checked multiple times the wound was closed with 4-0 subcuticular Monocryl sutures. Dermabond was placed  Patient was taken to the recovery room in stable condition.   Caroleen Hamman , MD, FACS

## 2020-11-25 NOTE — Anesthesia Postprocedure Evaluation (Signed)
Anesthesia Post Note  Patient: Sabrina Zamora  Procedure(s) Performed: BREAST LUMPECTOMY WITH RADIOFREQUENCY TAG IDENTIFICATION (Left Breast)  Patient location during evaluation: PACU Anesthesia Type: General Level of consciousness: awake and alert and oriented Pain management: pain level controlled Vital Signs Assessment: post-procedure vital signs reviewed and stable Respiratory status: spontaneous breathing, nonlabored ventilation and respiratory function stable Cardiovascular status: blood pressure returned to baseline and stable Postop Assessment: no signs of nausea or vomiting Anesthetic complications: no   No complications documented.   Last Vitals:  Vitals:   11/25/20 0946 11/25/20 0956  BP: 133/75 (!) 144/71  Pulse: 82 82  Resp: 16 16  Temp: (!) 36.2 C (!) 36.4 C  SpO2: 99% 100%    Last Pain:  Vitals:   11/25/20 0956  TempSrc: Temporal  PainSc: 2                  Steven Basso

## 2020-11-26 ENCOUNTER — Encounter: Payer: Self-pay | Admitting: Surgery

## 2020-12-03 ENCOUNTER — Telehealth: Payer: Self-pay | Admitting: Surgery

## 2020-12-03 NOTE — Telephone Encounter (Signed)
I called the pt about path results. Informed her that path had been upgraded to micro invasive mammary carcinoma. Given her age , ER + and clear margins I also explained to her that changes of SLN positivity were less 5%. Also d/w Dr. Tasia Catchings from oncology and given all factors it might be reasonable to omit SLNbx. It would be up to the pt the ultimate decision. She will think about it and come back to her f/u appt.

## 2020-12-05 ENCOUNTER — Other Ambulatory Visit: Payer: Self-pay | Admitting: Nurse Practitioner

## 2020-12-05 ENCOUNTER — Ambulatory Visit
Admission: RE | Admit: 2020-12-05 | Discharge: 2020-12-05 | Disposition: A | Discharge status: Home / Self Care | Payer: Medicare Other | Source: Ambulatory Visit | Attending: Radiation Oncology | Admitting: Radiation Oncology

## 2020-12-05 ENCOUNTER — Encounter: Payer: Self-pay | Admitting: Radiation Oncology

## 2020-12-05 VITALS — BP 161/97 | HR 80 | Temp 97.0°F | Wt 98.9 lb

## 2020-12-05 DIAGNOSIS — Z170 Estrogen receptor positive status [ER+]: Secondary | ICD-10-CM | POA: Diagnosis not present

## 2020-12-05 DIAGNOSIS — R911 Solitary pulmonary nodule: Secondary | ICD-10-CM | POA: Insufficient documentation

## 2020-12-05 DIAGNOSIS — D0512 Intraductal carcinoma in situ of left breast: Secondary | ICD-10-CM

## 2020-12-05 LAB — SURGICAL PATHOLOGY

## 2020-12-05 NOTE — Progress Notes (Signed)
Radiation Oncology Follow up Note  Name: Sabrina Zamora   Date:   12/05/2020 MRN:  [id number] DOB: [birth year]    This 87 y.o. female presents to the clinic today for reevaluation after wide local excision of the left breast for DCIS showing a microinvasion also following a lung nodule probably early stage adenocarcinoma although PET showed no hypermetabolic activity.  REFERRING PROVIDER: Luiz Ochoa, NP  HPI: Patient is an 87 year old female I originally consulted back in April.  She had diagnosis of DCIS of the left breast.  She also had a lung nodule in the left lower lobe which has slightly increased in size.  PET CT scan showed no hypermetabolic activity in this region and I declined to treat at this point and continue to observe.  She underwent a wide local excision for a 1 month millimeter DCIS with microinvasion noted.  Margins were clear at 1 cm.  No regional lymph nodes were submitted.  She is healing and is without complaint except from some soreness at the scar site which is understandable.  She is seen today for further opinion.  She specifically [place] cough hemoptysis or chest tightness.  COMPLICATIONS OF TREATMENT: none  FOLLOW UP COMPLIANCE: keeps appointments   PHYSICAL EXAM:  BP (!) 161/97   Pulse 80   Temp (!) 97 F (36.1 C) (Tympanic)   Wt 98 lb 14.4 oz (44.9 kg)   BMI 17.80 kg/m  Patient has a horizontal incision of the left breast which is healing well.  No dominant masses noted in either breast.  No axillary or supraclavicular adenopathy is identified.  Well-developed well-nourished patient in NAD. HEENT reveals PERLA, EOMI, discs not visualized.  Oral cavity is clear. No oral mucosal lesions are identified. Neck is clear without evidence of cervical or supraclavicular adenopathy. Lungs are clear to A&P. Cardiac examination is essentially unremarkable with regular rate and rhythm without murmur rub or thrill. Abdomen is benign with no organomegaly or masses noted.  Motor sensory and DTR levels are equal and symmetric in the upper and lower extremities. Cranial nerves II through XII are grossly intact. Proprioception is intact. No peripheral adenopathy or edema is identified. No motor or sensory levels are noted. Crude visual fields are within normal range.  RADIOLOGY RESULTS: Again PET CT scan and CT scans reviewed  PLAN: At this time I do not believe she needs any further surgery.  The chances of any lymph node involvement from this small tumor with microinvasion is extremely remote.  I would offer a 3-week course of hypofractionated whole breast radiation just to ensure there is no chance of any reoccurrence in this breast.  We will not have to boost her scar based on the widely clear margin.  Risks and benefits of breast radiation including skin reaction slight fatigue possible occlusion of superficial lung all were discussed in detail with the patient.  I also will continue to monitor her lung nodule with a repeat CT scan of the chest in approximately 6 months.  Patient comprehends my recommendations well.  I personally set up and ordered CT simulation after next week to allow for some further healing.  Her tumor is ER positive she would benefit from antiestrogen therapy and I will leave that up to medical oncology.  I would like to take this opportunity to thank you for allowing me to participate in the care of your patient.Noreene Filbert, MD

## 2020-12-06 ENCOUNTER — Other Ambulatory Visit: Payer: Self-pay

## 2020-12-06 ENCOUNTER — Encounter: Payer: Self-pay | Admitting: Oncology

## 2020-12-06 MED ORDER — AMLODIPINE BESYLATE 2.5 MG PO TABS: 2.5000 mg | ORAL_TABLET | Freq: Every day | ORAL | 3 refills | Status: DC

## 2020-12-09 ENCOUNTER — Inpatient Hospital Stay: Payer: Medicare Other

## 2020-12-09 ENCOUNTER — Inpatient Hospital Stay (HOSPITAL_BASED_OUTPATIENT_CLINIC_OR_DEPARTMENT_OTHER): Payer: Medicare Other | Admitting: Oncology

## 2020-12-09 ENCOUNTER — Inpatient Hospital Stay: Payer: Medicare Other | Attending: Oncology

## 2020-12-09 ENCOUNTER — Encounter: Payer: Self-pay | Admitting: Oncology

## 2020-12-09 VITALS — BP 152/86 | HR 76 | Temp 98.8°F | Resp 18 | Wt 95.7 lb

## 2020-12-09 DIAGNOSIS — D0512 Intraductal carcinoma in situ of left breast: Secondary | ICD-10-CM

## 2020-12-09 DIAGNOSIS — G4730 Sleep apnea, unspecified: Secondary | ICD-10-CM | POA: Insufficient documentation

## 2020-12-09 DIAGNOSIS — D751 Secondary polycythemia: Secondary | ICD-10-CM | POA: Diagnosis not present

## 2020-12-09 DIAGNOSIS — Z85820 Personal history of malignant melanoma of skin: Secondary | ICD-10-CM | POA: Insufficient documentation

## 2020-12-09 DIAGNOSIS — R911 Solitary pulmonary nodule: Secondary | ICD-10-CM | POA: Diagnosis not present

## 2020-12-09 DIAGNOSIS — C50912 Malignant neoplasm of unspecified site of left female breast: Secondary | ICD-10-CM | POA: Insufficient documentation

## 2020-12-09 DIAGNOSIS — Z79899 Other long term (current) drug therapy: Secondary | ICD-10-CM | POA: Diagnosis not present

## 2020-12-09 DIAGNOSIS — Z7189 Other specified counseling: Secondary | ICD-10-CM

## 2020-12-09 LAB — HEMOGLOBIN AND HEMATOCRIT, BLOOD
HCT: 47.1 % — ABNORMAL HIGH (ref 36.0–46.0)
Hemoglobin: 15.8 g/dL — ABNORMAL HIGH (ref 12.0–15.0)

## 2020-12-09 NOTE — Progress Notes (Signed)
Pt here for follow up. No new concerns voiced. Pt's daughter, Butch Penny, sent questions via [place]. Offered pt to call her daughter during visit, but she refused stating that she will relay the information to her.

## 2020-12-09 NOTE — Progress Notes (Signed)
[facility] day:  12/09/20  Chief Complaint: Sabrina Zamora is a 87 y.o. female with a history of secondary polycythemia presents for follow up .   HPI:  The patient was last seen in the medical oncology clinic on 02/04/2017.  At that time, she felt good.  Exam was unremarkable.  Hematocrit was 46.1 and hemoglobin 15.6.  She had mot required a phlebotomy since 04/2015.  Follow-up appointments were spread out.  CBC was to be checked every 6 months.   Patient previously followed up with Dr. Mike Gip.  Patient switched care to me on 11/29/2018. Extensive chart review was performed.  #Secondary polycythemia Patient has a history of sleep apnea she wears oxygen at night but no CPAP mask.  Never smoker. Work-up on 01/11/2012 reviewed and negative Jak 2 V617F mutation, exon 12 mutation. 08/25/2018 CALR negative, JAK 2 E12-15 negative, MPL negative.  Patient has been on phlebotomy program to keep hematocrit less than 48 and then later to keep it less than 50.  #History of melanoma x3.  T1 a melanoma in August 2012. saw Dermatology and had topical chemotherapy treatment to her forehead lesion.   #She has been compliant with CPAP machine. She did nocturnal oxygen testing and she is not qualified for nocturnal oxygen.   # Lung nodule  She is a never smoker.  Report second hand smoke exposure when she grew up and during the initial few years of her marriage.    Her husband stopped smoking in 1960s.   # 06/26/2020, CT without contrast showed a partial solid nodule within the superior segment of the left lower pole.  This measures 2.4 cm with a solid component measuring 5 mm.  Bronchiectasis with tree-in-bud nodularity is noted within the anterior and posterior basal portions of the right upper lobe.  10/06/2020 CT chest wo contrast - mixed solid and ground-glass nodule of  the anterior superior segment left lower lobe, with tenting of the adjacent fissural  pleura. The overall dimensions are approximately 2.8 x 2.4 cm, previously 2.6 x 2.3 cm when measured similarly, with nodular components measuring approximately 0.5 cm and 0.7 cm. Central nodular components appear somewhat more solid than on comparison prior although are not substantially changed in size a new 5 mm pulmonary nodule of the lateral segment right middle lobe, nonspecific.Stable 4 mm nodule of the anterior right lower lobe, which remains nonspecific.4. There are numerous additional unchanged clustered and tree-in-bud centrilobular nodules, particularly in the posterior right upper lobe. 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  Patient's case was discussed on tumor board on 10/15/2020.  Consensus reached about proceed with biopsy versus PET scan+ empiric SBRT given her age. 10/28/2020, PET scan did not show activity of the left lower lobe nodule.  However her tumor board discussion, CT appearance is indicated for of lung cancer.  Patient was referred to establish care with Dr. Baruch Gouty radiation oncology and was evaluated on 11/03/2020.  Dr. Cheron Schaumann recommend watchful waiting for the time being.  Should the nodule continues to solidify and grow patient will be offered empirically SBRT at that time.  Left breast DCIS with microinvasive component 09/25/20 screening mammogram showed possible distortion and calcifications in the left breast.  10/08/2020 left diagnostic mammogram showed Grouped microcalcifications in the left breast, Four circumscribed hypoechoic masses in the left breast may represent complicated cysts and are probably benign.  10/14/2020 left breast upper outer quadrant at middle depth-- intermediate grade DCIS, 8mm,  with microcalcifications.    INTERVAL HISTORY Sabrina Zamora is a 87 y.o. female who has above history reviewed by me today presents for follow up visit for erythrocytosis, lung nodule, left breast DCIS with microinvasive  component Problems and complaints are listed below: 11/25/2020, patient underwent left lumpectomy by Dr. Dahlia Byes There is microinvasive mammary carcinoma with high-grade DCIS.  Negative margins.  pTmi pNx Patient presents to discuss results and management plan.    Review of Systems  Constitutional: Negative for appetite change, chills, fatigue and fever.  HENT:   Negative for hearing loss and voice change.   Eyes: Negative for eye problems.  Respiratory: Negative for chest tightness and cough.   Cardiovascular: Negative for chest pain.  Gastrointestinal: Negative for abdominal distention, abdominal pain and blood in stool.  Endocrine: Negative for hot flashes.  Genitourinary: Negative for difficulty urinating and frequency.   Musculoskeletal: Negative for arthralgias.  Skin: Negative for itching and rash.  Neurological: Negative for extremity weakness.  Hematological: Negative for adenopathy.  Psychiatric/Behavioral: Negative for confusion. The patient is nervous/anxious.     Past Medical History:  Diagnosis Date  . Asthma   . GERD (gastroesophageal reflux disease)   . High cholesterol   . Hypertension   . Hypothyroidism   . Melanoma ([place]) 1986, 2008, 2016, 2021   melenoma left arm, left leg, back, back respectively  . Osteoporosis   . Polycythemia vera ([place])   . Skin cancer 2012, 2016  . Sleep apnea     Past Surgical History:  Procedure Laterality Date  . BREAST BIOPSY Left 2003   neg  . BREAST BIOPSY Right 09/27/2017   12:00 1 cmfn fibroadenomatous change   . BREAST BIOPSY Right 09/17/2017   12:00 3 cmfn benign breast and fibroadipose tissue  . BREAST BIOPSY Left 10/14/2020   stereo biopsy, ribbon clip/ INTERMEDIATE GRADE DUCTAL CARCINOMA IN SITU   . BREAST EXCISIONAL BIOPSY Left 1988   excisional - neg  . BREAST LUMPECTOMY WITH RADIOFREQUENCY TAG IDENTIFICATION Left 6/94/8546   Procedure: BREAST LUMPECTOMY WITH RADIOFREQUENCY TAG IDENTIFICATION;  Surgeon: Jules Husbands, MD;  Location: [facility];  Service: General;  Laterality: Left;  . CATARACT EXTRACTION W/PHACO Left 09/20/2017   Procedure: CATARACT EXTRACTION PHACO AND INTRAOCULAR LENS PLACEMENT ([place]) LEFT;  Surgeon: Leandrew Koyanagi, MD;  Location: [facility];  Service: Ophthalmology;  Laterality: Left;  . CATARACT EXTRACTION W/PHACO Right 02/15/2018   Procedure: CATARACT EXTRACTION PHACO AND INTRAOCULAR LENS PLACEMENT ([place]) TOPICAL  RIGHT;  Surgeon: Leandrew Koyanagi, MD;  Location: [facility];  Service: Ophthalmology;  Laterality: Right;  prefers early  . CHOLECYSTECTOMY    . EYE SURGERY      Family History  Problem Relation Age of Onset  . Clotting disorder Sister   . Breast cancer Sister 11  . Heart disease Mother   . Heart disease Father     Social History:  reports that she has never smoked. She has never used smokeless tobacco. She reports that she does not drink alcohol and does not use drugs.  She has never smoked.  The patient is alone today.  Allergies:  Allergies  Allergen Reactions  . Levofloxacin Rash and Shortness Of Breath  . Penicillins Hives and Rash  . Sulfa Antibiotics Rash  . Tobramycin Other (See Comments)    Unknown  . Betadine [Povidone Iodine] Rash  . Ciprofloxacin Itching and Rash    Current Medications: Current Outpatient Medications  Medication Sig Dispense Refill  . amLODipine (NORVASC)  2.5 MG tablet Take 1 tablet (2.5 mg total) by mouth daily. 45 tablet 3  . aspirin EC 81 MG tablet Take 81 mg by mouth 3 (three) times a week.    . Cholecalciferol (VITAMIN D) 50 MCG (2000 UT) CAPS Take 2,000 Units by mouth daily.    Marland Kitchen denosumab (PROLIA) 60 MG/ML SOSY injection Inject 60 mg into the skin every 6 (six) months.    . Fluticasone-Salmeterol (ADVAIR) 250-50 MCG/DOSE AEPB Inhale 1 puff into the lungs 2 (two) times daily. (Patient taking differently: Inhale 1 puff into the lungs at bedtime.) 60 each 3  . HYDROcodone-acetaminophen  (NORCO/VICODIN) 5-325 MG tablet Take 1-2 tablets by mouth every 6 (six) hours as needed for moderate pain. 20 tablet 0  . levothyroxine (SYNTHROID) 25 MCG tablet Take 1.5 tab po Daily before breakfast (Patient taking differently: Take 37.5 mcg by mouth daily before breakfast.) 135 tablet 3  . lovastatin (MEVACOR) 20 MG tablet Take 1 tablet (20 mg total) by mouth at bedtime. 90 tablet 2  . Multiple Vitamin (MULTI-VITAMINS) TABS Take 1 tablet by mouth daily.    . mupirocin nasal ointment (BACTROBAN) 2 % Place 1 application into the nose at bedtime. (Patient taking differently: Place 1 application into the nose daily as needed (nasal infection).) 10 g 1  . Zinc 25 MG TABS Take 25 mg by mouth daily.     No current facility-administered medications for this visit.    Physical Exam: Blood pressure (!) 152/86, pulse 76, temperature 98.8 F (37.1 C), resp. rate 18, weight 95 lb 11.2 oz (43.4 kg).  Physical Exam Constitutional:      General: She is not in acute distress.    Appearance: She is not diaphoretic.     Comments: Thin built, walks independently.   HENT:     Head: Normocephalic and atraumatic.     Nose: Nose normal.     Mouth/Throat:     Pharynx: No oropharyngeal exudate.  Eyes:     General: No scleral icterus.    Pupils: Pupils are equal, round, and reactive to light.  Cardiovascular:     Rate and Rhythm: Normal rate and regular rhythm.     Heart sounds: No murmur heard.   Pulmonary:     Effort: Pulmonary effort is normal. No respiratory distress.     Breath sounds: No rales.  Chest:     Chest wall: No tenderness.  Abdominal:     General: There is no distension.     Palpations: Abdomen is soft.     Tenderness: There is no abdominal tenderness.  Musculoskeletal:        General: Normal range of motion.     Cervical back: Normal range of motion and neck supple.  Skin:    General: Skin is warm and dry.     Findings: No erythema.  Neurological:     Mental Status: She is  alert and oriented to person, place, and time.     Cranial Nerves: No cranial nerve deficit.     Motor: No abnormal muscle tone.     Coordination: Coordination normal.  Psychiatric:        Mood and Affect: Affect normal.   Status post left lumpectomy, healing surgery site.   Appointment on 12/09/2020  Component Date Value Ref Range Status  . Hemoglobin 12/09/2020 15.8* 12.0 - 15.0 g/dL Final  . HCT 12/09/2020 47.1* 36.0 - 46.0 % Final   Performed at [facility], [place],  [place] [address]    Assessment:  Sabrina Zamora is a 87 y.o. female present for follow up of erythrocytosis.  1. Ductal carcinoma in situ (DCIS) of left breast with microinvasive component   2. Secondary erythrocytosis   3. Goals of care, counseling/discussion   4. Lung nodule    # erythrocytosis, presumed to be secondary to sleep apnea.  Labs reviewed and discussed with patient.  No need for phlebotomy today.  #Lung nodule found on chest x-ray and confirmed on CT scan, no FDG activity on PET scan. Patient has establish care with radiation oncology and was recommended watchful waiting.  #Left breast DCIS with microinvasive component, pTMi pN0 Status postlumpectomy.  No sentinel lymph node biopsy was done. Pathology results were reviewed in details with patient. Given her advanced age, multiple other medical conditions, strongly ER positivity, I think it is reasonable to omit sentinel lymph node biopsy.  Recommend patient to follow-up with radiation oncology for adjuvant radiation. Once she finishes radiation, will see patient for discussion about antiestrogen treatments.  I will see patient 2-3 weeks after the last radiation treatments. I offered patient to call her daughter and the patient declined.  She will relay above information to her daughter. We spent sufficient time to discuss many aspect of care, questions were answered to patient's satisfaction.   Earlie Server, MD  12/09/2020,  1:03 PM

## 2020-12-10 ENCOUNTER — Other Ambulatory Visit: Payer: Self-pay

## 2020-12-10 ENCOUNTER — Encounter: Payer: Self-pay | Admitting: Surgery

## 2020-12-10 ENCOUNTER — Ambulatory Visit (INDEPENDENT_AMBULATORY_CARE_PROVIDER_SITE_OTHER): Payer: Medicare Other | Admitting: Surgery

## 2020-12-10 VITALS — BP 185/78 | HR 103 | Temp 98.4°F | Ht 62.5 in | Wt 95.0 lb

## 2020-12-10 DIAGNOSIS — Z09 Encounter for follow-up examination after completed treatment for conditions other than malignant neoplasm: Secondary | ICD-10-CM

## 2020-12-10 DIAGNOSIS — M810 Age-related osteoporosis without current pathological fracture: Secondary | ICD-10-CM | POA: Diagnosis not present

## 2020-12-10 NOTE — Patient Instructions (Addendum)
You may use Melatonin for sleep. You may also try Benadryl if needed.   It is ok to have your Prolia injection.    Follow up here in 3 months.

## 2020-12-10 NOTE — Progress Notes (Signed)
S/p Left lumpectomy final path had been upgraded to micro invasive mammary carcinoma.  Extensive d/w pt and family about results/  Given her age , ER + , HEr 2 (-) and clear margins it might be reasonable to omit SLNbx. Ultimately it was the pt decision. She wishes to hold on SL:Nbx She will complete XRT  PE NAD Incision healing well, no infection, seromas or hematomas   A/ pDoing very well RTC 3 months F/u XRT and oncology

## 2020-12-15 ENCOUNTER — Ambulatory Visit
Admission: RE | Admit: 2020-12-15 | Discharge: 2020-12-15 | Disposition: A | Discharge status: Home / Self Care | Payer: Medicare Other | Source: Ambulatory Visit | Attending: Radiation Oncology | Admitting: Radiation Oncology

## 2020-12-15 DIAGNOSIS — E039 Hypothyroidism, unspecified: Secondary | ICD-10-CM | POA: Insufficient documentation

## 2020-12-15 DIAGNOSIS — M810 Age-related osteoporosis without current pathological fracture: Secondary | ICD-10-CM | POA: Insufficient documentation

## 2020-12-15 DIAGNOSIS — I10 Essential (primary) hypertension: Secondary | ICD-10-CM | POA: Insufficient documentation

## 2020-12-15 DIAGNOSIS — Z803 Family history of malignant neoplasm of breast: Secondary | ICD-10-CM | POA: Insufficient documentation

## 2020-12-15 DIAGNOSIS — Z85828 Personal history of other malignant neoplasm of skin: Secondary | ICD-10-CM | POA: Insufficient documentation

## 2020-12-15 DIAGNOSIS — Z79899 Other long term (current) drug therapy: Secondary | ICD-10-CM | POA: Insufficient documentation

## 2020-12-15 DIAGNOSIS — D0512 Intraductal carcinoma in situ of left breast: Secondary | ICD-10-CM | POA: Insufficient documentation

## 2020-12-15 DIAGNOSIS — Z170 Estrogen receptor positive status [ER+]: Secondary | ICD-10-CM | POA: Diagnosis not present

## 2020-12-15 DIAGNOSIS — E7800 Pure hypercholesterolemia, unspecified: Secondary | ICD-10-CM | POA: Diagnosis not present

## 2020-12-15 DIAGNOSIS — R918 Other nonspecific abnormal finding of lung field: Secondary | ICD-10-CM | POA: Insufficient documentation

## 2020-12-15 DIAGNOSIS — Z7982 Long term (current) use of aspirin: Secondary | ICD-10-CM | POA: Insufficient documentation

## 2020-12-16 ENCOUNTER — Other Ambulatory Visit: Payer: Self-pay | Admitting: Oncology

## 2020-12-17 ENCOUNTER — Ambulatory Visit: Payer: Medicare Other

## 2020-12-17 DIAGNOSIS — E039 Hypothyroidism, unspecified: Secondary | ICD-10-CM | POA: Diagnosis not present

## 2020-12-17 DIAGNOSIS — D0512 Intraductal carcinoma in situ of left breast: Secondary | ICD-10-CM | POA: Diagnosis not present

## 2020-12-17 DIAGNOSIS — M810 Age-related osteoporosis without current pathological fracture: Secondary | ICD-10-CM | POA: Diagnosis not present

## 2020-12-17 DIAGNOSIS — Z170 Estrogen receptor positive status [ER+]: Secondary | ICD-10-CM | POA: Diagnosis not present

## 2020-12-17 DIAGNOSIS — R918 Other nonspecific abnormal finding of lung field: Secondary | ICD-10-CM | POA: Diagnosis not present

## 2020-12-17 DIAGNOSIS — E7800 Pure hypercholesterolemia, unspecified: Secondary | ICD-10-CM | POA: Diagnosis not present

## 2020-12-17 DIAGNOSIS — I10 Essential (primary) hypertension: Secondary | ICD-10-CM | POA: Diagnosis not present

## 2020-12-19 ENCOUNTER — Other Ambulatory Visit: Payer: Self-pay | Admitting: *Deleted

## 2020-12-19 DIAGNOSIS — D0512 Intraductal carcinoma in situ of left breast: Secondary | ICD-10-CM

## 2020-12-22 ENCOUNTER — Ambulatory Visit: Admission: RE | Admit: 2020-12-22 | Payer: Medicare Other | Source: Ambulatory Visit

## 2020-12-22 DIAGNOSIS — Z170 Estrogen receptor positive status [ER+]: Secondary | ICD-10-CM | POA: Diagnosis not present

## 2020-12-22 DIAGNOSIS — M810 Age-related osteoporosis without current pathological fracture: Secondary | ICD-10-CM | POA: Diagnosis not present

## 2020-12-22 DIAGNOSIS — E039 Hypothyroidism, unspecified: Secondary | ICD-10-CM | POA: Diagnosis not present

## 2020-12-22 DIAGNOSIS — R918 Other nonspecific abnormal finding of lung field: Secondary | ICD-10-CM | POA: Diagnosis not present

## 2020-12-22 DIAGNOSIS — E7800 Pure hypercholesterolemia, unspecified: Secondary | ICD-10-CM | POA: Diagnosis not present

## 2020-12-22 DIAGNOSIS — D0512 Intraductal carcinoma in situ of left breast: Secondary | ICD-10-CM | POA: Diagnosis not present

## 2020-12-22 DIAGNOSIS — I10 Essential (primary) hypertension: Secondary | ICD-10-CM | POA: Diagnosis not present

## 2020-12-23 ENCOUNTER — Ambulatory Visit
Admission: RE | Admit: 2020-12-23 | Discharge: 2020-12-23 | Disposition: A | Discharge status: Home / Self Care | Payer: Medicare Other | Source: Ambulatory Visit | Attending: Radiation Oncology | Admitting: Radiation Oncology

## 2020-12-23 DIAGNOSIS — E039 Hypothyroidism, unspecified: Secondary | ICD-10-CM | POA: Diagnosis not present

## 2020-12-23 DIAGNOSIS — M810 Age-related osteoporosis without current pathological fracture: Secondary | ICD-10-CM | POA: Diagnosis not present

## 2020-12-23 DIAGNOSIS — R918 Other nonspecific abnormal finding of lung field: Secondary | ICD-10-CM | POA: Diagnosis not present

## 2020-12-23 DIAGNOSIS — E7800 Pure hypercholesterolemia, unspecified: Secondary | ICD-10-CM | POA: Diagnosis not present

## 2020-12-23 DIAGNOSIS — D0512 Intraductal carcinoma in situ of left breast: Secondary | ICD-10-CM | POA: Diagnosis not present

## 2020-12-23 DIAGNOSIS — I10 Essential (primary) hypertension: Secondary | ICD-10-CM | POA: Diagnosis not present

## 2020-12-23 DIAGNOSIS — Z170 Estrogen receptor positive status [ER+]: Secondary | ICD-10-CM | POA: Diagnosis not present

## 2020-12-24 ENCOUNTER — Ambulatory Visit
Admission: RE | Admit: 2020-12-24 | Discharge: 2020-12-24 | Disposition: A | Discharge status: Home / Self Care | Payer: Medicare Other | Source: Ambulatory Visit | Attending: Radiation Oncology | Admitting: Radiation Oncology

## 2020-12-24 DIAGNOSIS — M810 Age-related osteoporosis without current pathological fracture: Secondary | ICD-10-CM | POA: Diagnosis not present

## 2020-12-24 DIAGNOSIS — R918 Other nonspecific abnormal finding of lung field: Secondary | ICD-10-CM | POA: Diagnosis not present

## 2020-12-24 DIAGNOSIS — E039 Hypothyroidism, unspecified: Secondary | ICD-10-CM | POA: Diagnosis not present

## 2020-12-24 DIAGNOSIS — I10 Essential (primary) hypertension: Secondary | ICD-10-CM | POA: Diagnosis not present

## 2020-12-24 DIAGNOSIS — D0512 Intraductal carcinoma in situ of left breast: Secondary | ICD-10-CM | POA: Diagnosis not present

## 2020-12-24 DIAGNOSIS — E7800 Pure hypercholesterolemia, unspecified: Secondary | ICD-10-CM | POA: Diagnosis not present

## 2020-12-24 DIAGNOSIS — Z170 Estrogen receptor positive status [ER+]: Secondary | ICD-10-CM | POA: Diagnosis not present

## 2020-12-25 ENCOUNTER — Ambulatory Visit
Admission: RE | Admit: 2020-12-25 | Discharge: 2020-12-25 | Disposition: A | Discharge status: Home / Self Care | Payer: Medicare Other | Source: Ambulatory Visit | Attending: Radiation Oncology | Admitting: Radiation Oncology

## 2020-12-25 DIAGNOSIS — M810 Age-related osteoporosis without current pathological fracture: Secondary | ICD-10-CM | POA: Diagnosis not present

## 2020-12-25 DIAGNOSIS — D0512 Intraductal carcinoma in situ of left breast: Secondary | ICD-10-CM | POA: Diagnosis not present

## 2020-12-25 DIAGNOSIS — E039 Hypothyroidism, unspecified: Secondary | ICD-10-CM | POA: Diagnosis not present

## 2020-12-25 DIAGNOSIS — I10 Essential (primary) hypertension: Secondary | ICD-10-CM | POA: Diagnosis not present

## 2020-12-25 DIAGNOSIS — R918 Other nonspecific abnormal finding of lung field: Secondary | ICD-10-CM | POA: Diagnosis not present

## 2020-12-25 DIAGNOSIS — E7800 Pure hypercholesterolemia, unspecified: Secondary | ICD-10-CM | POA: Diagnosis not present

## 2020-12-25 DIAGNOSIS — Z170 Estrogen receptor positive status [ER+]: Secondary | ICD-10-CM | POA: Diagnosis not present

## 2020-12-26 ENCOUNTER — Ambulatory Visit
Admission: RE | Admit: 2020-12-26 | Discharge: 2020-12-26 | Disposition: A | Discharge status: Home / Self Care | Payer: Medicare Other | Source: Ambulatory Visit | Attending: Radiation Oncology | Admitting: Radiation Oncology

## 2020-12-26 DIAGNOSIS — R918 Other nonspecific abnormal finding of lung field: Secondary | ICD-10-CM | POA: Diagnosis not present

## 2020-12-26 DIAGNOSIS — M810 Age-related osteoporosis without current pathological fracture: Secondary | ICD-10-CM | POA: Diagnosis not present

## 2020-12-26 DIAGNOSIS — E7800 Pure hypercholesterolemia, unspecified: Secondary | ICD-10-CM | POA: Diagnosis not present

## 2020-12-26 DIAGNOSIS — Z170 Estrogen receptor positive status [ER+]: Secondary | ICD-10-CM | POA: Diagnosis not present

## 2020-12-26 DIAGNOSIS — D0512 Intraductal carcinoma in situ of left breast: Secondary | ICD-10-CM | POA: Diagnosis not present

## 2020-12-26 DIAGNOSIS — I10 Essential (primary) hypertension: Secondary | ICD-10-CM | POA: Diagnosis not present

## 2020-12-26 DIAGNOSIS — E039 Hypothyroidism, unspecified: Secondary | ICD-10-CM | POA: Diagnosis not present

## 2020-12-29 ENCOUNTER — Ambulatory Visit
Admission: RE | Admit: 2020-12-29 | Discharge: 2020-12-29 | Disposition: A | Discharge status: Home / Self Care | Payer: Medicare Other | Source: Ambulatory Visit | Attending: Radiation Oncology | Admitting: Radiation Oncology

## 2020-12-29 DIAGNOSIS — D0512 Intraductal carcinoma in situ of left breast: Secondary | ICD-10-CM | POA: Diagnosis not present

## 2020-12-29 DIAGNOSIS — E7800 Pure hypercholesterolemia, unspecified: Secondary | ICD-10-CM | POA: Diagnosis not present

## 2020-12-29 DIAGNOSIS — R918 Other nonspecific abnormal finding of lung field: Secondary | ICD-10-CM | POA: Diagnosis not present

## 2020-12-29 DIAGNOSIS — E039 Hypothyroidism, unspecified: Secondary | ICD-10-CM | POA: Diagnosis not present

## 2020-12-29 DIAGNOSIS — I10 Essential (primary) hypertension: Secondary | ICD-10-CM | POA: Diagnosis not present

## 2020-12-29 DIAGNOSIS — Z170 Estrogen receptor positive status [ER+]: Secondary | ICD-10-CM | POA: Diagnosis not present

## 2020-12-29 DIAGNOSIS — M810 Age-related osteoporosis without current pathological fracture: Secondary | ICD-10-CM | POA: Diagnosis not present

## 2020-12-30 ENCOUNTER — Ambulatory Visit
Admission: RE | Admit: 2020-12-30 | Discharge: 2020-12-30 | Disposition: A | Discharge status: Home / Self Care | Payer: Medicare Other | Source: Ambulatory Visit | Attending: Radiation Oncology | Admitting: Radiation Oncology

## 2020-12-30 DIAGNOSIS — D0512 Intraductal carcinoma in situ of left breast: Secondary | ICD-10-CM | POA: Diagnosis not present

## 2020-12-30 DIAGNOSIS — M810 Age-related osteoporosis without current pathological fracture: Secondary | ICD-10-CM | POA: Diagnosis not present

## 2020-12-30 DIAGNOSIS — E7800 Pure hypercholesterolemia, unspecified: Secondary | ICD-10-CM | POA: Diagnosis not present

## 2020-12-30 DIAGNOSIS — I10 Essential (primary) hypertension: Secondary | ICD-10-CM | POA: Diagnosis not present

## 2020-12-30 DIAGNOSIS — E039 Hypothyroidism, unspecified: Secondary | ICD-10-CM | POA: Diagnosis not present

## 2020-12-30 DIAGNOSIS — Z170 Estrogen receptor positive status [ER+]: Secondary | ICD-10-CM | POA: Diagnosis not present

## 2020-12-30 DIAGNOSIS — R918 Other nonspecific abnormal finding of lung field: Secondary | ICD-10-CM | POA: Diagnosis not present

## 2020-12-31 ENCOUNTER — Ambulatory Visit
Admission: RE | Admit: 2020-12-31 | Discharge: 2020-12-31 | Disposition: A | Discharge status: Home / Self Care | Payer: Medicare Other | Source: Ambulatory Visit | Attending: Radiation Oncology | Admitting: Radiation Oncology

## 2020-12-31 DIAGNOSIS — E7800 Pure hypercholesterolemia, unspecified: Secondary | ICD-10-CM | POA: Diagnosis not present

## 2020-12-31 DIAGNOSIS — Z170 Estrogen receptor positive status [ER+]: Secondary | ICD-10-CM | POA: Diagnosis not present

## 2020-12-31 DIAGNOSIS — I10 Essential (primary) hypertension: Secondary | ICD-10-CM | POA: Diagnosis not present

## 2020-12-31 DIAGNOSIS — M810 Age-related osteoporosis without current pathological fracture: Secondary | ICD-10-CM | POA: Diagnosis not present

## 2020-12-31 DIAGNOSIS — R918 Other nonspecific abnormal finding of lung field: Secondary | ICD-10-CM | POA: Diagnosis not present

## 2020-12-31 DIAGNOSIS — D0512 Intraductal carcinoma in situ of left breast: Secondary | ICD-10-CM | POA: Diagnosis not present

## 2020-12-31 DIAGNOSIS — E039 Hypothyroidism, unspecified: Secondary | ICD-10-CM | POA: Diagnosis not present

## 2021-01-01 ENCOUNTER — Ambulatory Visit
Admission: RE | Admit: 2021-01-01 | Discharge: 2021-01-01 | Disposition: A | Discharge status: Home / Self Care | Payer: Medicare Other | Source: Ambulatory Visit | Attending: Radiation Oncology | Admitting: Radiation Oncology

## 2021-01-01 DIAGNOSIS — Z170 Estrogen receptor positive status [ER+]: Secondary | ICD-10-CM | POA: Diagnosis not present

## 2021-01-01 DIAGNOSIS — D0512 Intraductal carcinoma in situ of left breast: Secondary | ICD-10-CM | POA: Diagnosis not present

## 2021-01-01 DIAGNOSIS — E7800 Pure hypercholesterolemia, unspecified: Secondary | ICD-10-CM | POA: Diagnosis not present

## 2021-01-01 DIAGNOSIS — I10 Essential (primary) hypertension: Secondary | ICD-10-CM | POA: Diagnosis not present

## 2021-01-01 DIAGNOSIS — E039 Hypothyroidism, unspecified: Secondary | ICD-10-CM | POA: Diagnosis not present

## 2021-01-01 DIAGNOSIS — M810 Age-related osteoporosis without current pathological fracture: Secondary | ICD-10-CM | POA: Diagnosis not present

## 2021-01-01 DIAGNOSIS — R918 Other nonspecific abnormal finding of lung field: Secondary | ICD-10-CM | POA: Diagnosis not present

## 2021-01-02 ENCOUNTER — Ambulatory Visit
Admission: RE | Admit: 2021-01-02 | Discharge: 2021-01-02 | Disposition: A | Discharge status: Home / Self Care | Payer: Medicare Other | Source: Ambulatory Visit | Attending: Radiation Oncology | Admitting: Radiation Oncology

## 2021-01-02 DIAGNOSIS — R918 Other nonspecific abnormal finding of lung field: Secondary | ICD-10-CM | POA: Diagnosis not present

## 2021-01-02 DIAGNOSIS — I10 Essential (primary) hypertension: Secondary | ICD-10-CM | POA: Diagnosis not present

## 2021-01-02 DIAGNOSIS — D0512 Intraductal carcinoma in situ of left breast: Secondary | ICD-10-CM | POA: Diagnosis not present

## 2021-01-02 DIAGNOSIS — E7800 Pure hypercholesterolemia, unspecified: Secondary | ICD-10-CM | POA: Diagnosis not present

## 2021-01-02 DIAGNOSIS — E039 Hypothyroidism, unspecified: Secondary | ICD-10-CM | POA: Diagnosis not present

## 2021-01-02 DIAGNOSIS — Z170 Estrogen receptor positive status [ER+]: Secondary | ICD-10-CM | POA: Diagnosis not present

## 2021-01-02 DIAGNOSIS — M810 Age-related osteoporosis without current pathological fracture: Secondary | ICD-10-CM | POA: Diagnosis not present

## 2021-01-05 ENCOUNTER — Ambulatory Visit
Admission: RE | Admit: 2021-01-05 | Discharge: 2021-01-05 | Disposition: A | Discharge status: Home / Self Care | Payer: Medicare Other | Source: Ambulatory Visit | Attending: Radiation Oncology | Admitting: Radiation Oncology

## 2021-01-05 DIAGNOSIS — D0512 Intraductal carcinoma in situ of left breast: Secondary | ICD-10-CM | POA: Diagnosis not present

## 2021-01-05 DIAGNOSIS — R918 Other nonspecific abnormal finding of lung field: Secondary | ICD-10-CM | POA: Diagnosis not present

## 2021-01-05 DIAGNOSIS — M810 Age-related osteoporosis without current pathological fracture: Secondary | ICD-10-CM | POA: Diagnosis not present

## 2021-01-05 DIAGNOSIS — E7800 Pure hypercholesterolemia, unspecified: Secondary | ICD-10-CM | POA: Diagnosis not present

## 2021-01-05 DIAGNOSIS — Z170 Estrogen receptor positive status [ER+]: Secondary | ICD-10-CM | POA: Diagnosis not present

## 2021-01-05 DIAGNOSIS — E039 Hypothyroidism, unspecified: Secondary | ICD-10-CM | POA: Diagnosis not present

## 2021-01-05 DIAGNOSIS — I10 Essential (primary) hypertension: Secondary | ICD-10-CM | POA: Diagnosis not present

## 2021-01-06 ENCOUNTER — Ambulatory Visit
Admission: RE | Admit: 2021-01-06 | Discharge: 2021-01-06 | Disposition: A | Discharge status: Home / Self Care | Payer: Medicare Other | Source: Ambulatory Visit | Attending: Radiation Oncology | Admitting: Radiation Oncology

## 2021-01-06 ENCOUNTER — Other Ambulatory Visit: Payer: Self-pay | Admitting: Nurse Practitioner

## 2021-01-06 DIAGNOSIS — I10 Essential (primary) hypertension: Secondary | ICD-10-CM | POA: Diagnosis not present

## 2021-01-06 DIAGNOSIS — D0512 Intraductal carcinoma in situ of left breast: Secondary | ICD-10-CM | POA: Diagnosis not present

## 2021-01-06 DIAGNOSIS — J449 Chronic obstructive pulmonary disease, unspecified: Secondary | ICD-10-CM

## 2021-01-06 DIAGNOSIS — E7800 Pure hypercholesterolemia, unspecified: Secondary | ICD-10-CM | POA: Diagnosis not present

## 2021-01-06 DIAGNOSIS — E039 Hypothyroidism, unspecified: Secondary | ICD-10-CM | POA: Diagnosis not present

## 2021-01-06 DIAGNOSIS — M810 Age-related osteoporosis without current pathological fracture: Secondary | ICD-10-CM | POA: Diagnosis not present

## 2021-01-06 DIAGNOSIS — R918 Other nonspecific abnormal finding of lung field: Secondary | ICD-10-CM | POA: Diagnosis not present

## 2021-01-06 DIAGNOSIS — Z170 Estrogen receptor positive status [ER+]: Secondary | ICD-10-CM | POA: Diagnosis not present

## 2021-01-07 ENCOUNTER — Ambulatory Visit
Admission: RE | Admit: 2021-01-07 | Discharge: 2021-01-07 | Disposition: A | Discharge status: Home / Self Care | Payer: Medicare Other | Source: Ambulatory Visit | Attending: Radiation Oncology | Admitting: Radiation Oncology

## 2021-01-07 ENCOUNTER — Inpatient Hospital Stay: Payer: Medicare Other | Attending: Oncology

## 2021-01-07 DIAGNOSIS — D0512 Intraductal carcinoma in situ of left breast: Secondary | ICD-10-CM

## 2021-01-07 DIAGNOSIS — I10 Essential (primary) hypertension: Secondary | ICD-10-CM | POA: Diagnosis not present

## 2021-01-07 DIAGNOSIS — R918 Other nonspecific abnormal finding of lung field: Secondary | ICD-10-CM | POA: Diagnosis not present

## 2021-01-07 DIAGNOSIS — E039 Hypothyroidism, unspecified: Secondary | ICD-10-CM | POA: Diagnosis not present

## 2021-01-07 DIAGNOSIS — M810 Age-related osteoporosis without current pathological fracture: Secondary | ICD-10-CM | POA: Diagnosis not present

## 2021-01-07 DIAGNOSIS — E7800 Pure hypercholesterolemia, unspecified: Secondary | ICD-10-CM | POA: Diagnosis not present

## 2021-01-07 LAB — CBC
HCT: 49.2 % — ABNORMAL HIGH (ref 36.0–46.0)
Hemoglobin: 16.3 g/dL — ABNORMAL HIGH (ref 12.0–15.0)
MCH: 31.9 pg (ref 26.0–34.0)
MCHC: 33.1 g/dL (ref 30.0–36.0)
MCV: 96.3 fL (ref 80.0–100.0)
Platelets: 201 10*3/uL (ref 150–400)
RBC: 5.11 MIL/uL (ref 3.87–5.11)
RDW: 13.3 % (ref 11.5–15.5)
WBC: 5.7 10*3/uL (ref 4.0–10.5)
nRBC: 0 % (ref 0.0–0.2)

## 2021-01-08 ENCOUNTER — Ambulatory Visit
Admission: RE | Admit: 2021-01-08 | Discharge: 2021-01-08 | Disposition: A | Discharge status: Home / Self Care | Payer: Medicare Other | Source: Ambulatory Visit | Attending: Radiation Oncology | Admitting: Radiation Oncology

## 2021-01-08 DIAGNOSIS — E039 Hypothyroidism, unspecified: Secondary | ICD-10-CM | POA: Diagnosis not present

## 2021-01-08 DIAGNOSIS — E7800 Pure hypercholesterolemia, unspecified: Secondary | ICD-10-CM | POA: Diagnosis not present

## 2021-01-08 DIAGNOSIS — R918 Other nonspecific abnormal finding of lung field: Secondary | ICD-10-CM | POA: Diagnosis not present

## 2021-01-08 DIAGNOSIS — I10 Essential (primary) hypertension: Secondary | ICD-10-CM | POA: Diagnosis not present

## 2021-01-08 DIAGNOSIS — D0512 Intraductal carcinoma in situ of left breast: Secondary | ICD-10-CM | POA: Diagnosis not present

## 2021-01-08 DIAGNOSIS — M810 Age-related osteoporosis without current pathological fracture: Secondary | ICD-10-CM | POA: Diagnosis not present

## 2021-01-08 DIAGNOSIS — Z170 Estrogen receptor positive status [ER+]: Secondary | ICD-10-CM | POA: Diagnosis not present

## 2021-01-09 ENCOUNTER — Ambulatory Visit
Admission: RE | Admit: 2021-01-09 | Discharge: 2021-01-09 | Disposition: A | Discharge status: Home / Self Care | Payer: Medicare Other | Source: Ambulatory Visit | Attending: Radiation Oncology | Admitting: Radiation Oncology

## 2021-01-09 DIAGNOSIS — Z803 Family history of malignant neoplasm of breast: Secondary | ICD-10-CM | POA: Diagnosis not present

## 2021-01-09 DIAGNOSIS — I10 Essential (primary) hypertension: Secondary | ICD-10-CM | POA: Insufficient documentation

## 2021-01-09 DIAGNOSIS — Z7982 Long term (current) use of aspirin: Secondary | ICD-10-CM | POA: Diagnosis not present

## 2021-01-09 DIAGNOSIS — Z85828 Personal history of other malignant neoplasm of skin: Secondary | ICD-10-CM | POA: Insufficient documentation

## 2021-01-09 DIAGNOSIS — D0512 Intraductal carcinoma in situ of left breast: Secondary | ICD-10-CM | POA: Insufficient documentation

## 2021-01-09 DIAGNOSIS — E7800 Pure hypercholesterolemia, unspecified: Secondary | ICD-10-CM | POA: Diagnosis not present

## 2021-01-09 DIAGNOSIS — M810 Age-related osteoporosis without current pathological fracture: Secondary | ICD-10-CM | POA: Diagnosis not present

## 2021-01-09 DIAGNOSIS — Z79899 Other long term (current) drug therapy: Secondary | ICD-10-CM | POA: Insufficient documentation

## 2021-01-09 DIAGNOSIS — Z170 Estrogen receptor positive status [ER+]: Secondary | ICD-10-CM | POA: Diagnosis not present

## 2021-01-09 DIAGNOSIS — R918 Other nonspecific abnormal finding of lung field: Secondary | ICD-10-CM | POA: Insufficient documentation

## 2021-01-09 DIAGNOSIS — E039 Hypothyroidism, unspecified: Secondary | ICD-10-CM | POA: Diagnosis not present

## 2021-01-13 ENCOUNTER — Ambulatory Visit
Admission: RE | Admit: 2021-01-13 | Discharge: 2021-01-13 | Disposition: A | Discharge status: Home / Self Care | Payer: Medicare Other | Source: Ambulatory Visit | Attending: Radiation Oncology | Admitting: Radiation Oncology

## 2021-01-13 DIAGNOSIS — D0512 Intraductal carcinoma in situ of left breast: Secondary | ICD-10-CM | POA: Diagnosis not present

## 2021-01-13 DIAGNOSIS — Z170 Estrogen receptor positive status [ER+]: Secondary | ICD-10-CM | POA: Diagnosis not present

## 2021-01-13 DIAGNOSIS — R918 Other nonspecific abnormal finding of lung field: Secondary | ICD-10-CM | POA: Diagnosis not present

## 2021-01-13 DIAGNOSIS — E7800 Pure hypercholesterolemia, unspecified: Secondary | ICD-10-CM | POA: Diagnosis not present

## 2021-01-13 DIAGNOSIS — E039 Hypothyroidism, unspecified: Secondary | ICD-10-CM | POA: Diagnosis not present

## 2021-01-13 DIAGNOSIS — M810 Age-related osteoporosis without current pathological fracture: Secondary | ICD-10-CM | POA: Diagnosis not present

## 2021-01-13 DIAGNOSIS — I10 Essential (primary) hypertension: Secondary | ICD-10-CM | POA: Diagnosis not present

## 2021-01-14 ENCOUNTER — Ambulatory Visit
Admission: RE | Admit: 2021-01-14 | Discharge: 2021-01-14 | Disposition: A | Discharge status: Home / Self Care | Payer: Medicare Other | Source: Ambulatory Visit | Attending: Radiation Oncology | Admitting: Radiation Oncology

## 2021-01-14 DIAGNOSIS — R918 Other nonspecific abnormal finding of lung field: Secondary | ICD-10-CM | POA: Diagnosis not present

## 2021-01-14 DIAGNOSIS — I10 Essential (primary) hypertension: Secondary | ICD-10-CM | POA: Diagnosis not present

## 2021-01-14 DIAGNOSIS — D0512 Intraductal carcinoma in situ of left breast: Secondary | ICD-10-CM | POA: Diagnosis not present

## 2021-01-14 DIAGNOSIS — E7800 Pure hypercholesterolemia, unspecified: Secondary | ICD-10-CM | POA: Diagnosis not present

## 2021-01-14 DIAGNOSIS — E039 Hypothyroidism, unspecified: Secondary | ICD-10-CM | POA: Diagnosis not present

## 2021-01-14 DIAGNOSIS — M810 Age-related osteoporosis without current pathological fracture: Secondary | ICD-10-CM | POA: Diagnosis not present

## 2021-01-14 DIAGNOSIS — Z170 Estrogen receptor positive status [ER+]: Secondary | ICD-10-CM | POA: Diagnosis not present

## 2021-01-26 ENCOUNTER — Telehealth: Payer: Self-pay | Admitting: Oncology

## 2021-01-26 NOTE — Telephone Encounter (Signed)
Patient called requesting a phlebotomy encounter added just incase she needs it.  Phlebotomy encounter added after she sees Dr. Massie Maroon, patient confirmed.

## 2021-01-30 ENCOUNTER — Other Ambulatory Visit: Payer: Self-pay

## 2021-01-30 DIAGNOSIS — C50912 Malignant neoplasm of unspecified site of left female breast: Secondary | ICD-10-CM

## 2021-01-30 DIAGNOSIS — D0512 Intraductal carcinoma in situ of left breast: Secondary | ICD-10-CM

## 2021-02-03 ENCOUNTER — Inpatient Hospital Stay (HOSPITAL_BASED_OUTPATIENT_CLINIC_OR_DEPARTMENT_OTHER): Payer: Medicare Other | Admitting: Oncology

## 2021-02-03 ENCOUNTER — Other Ambulatory Visit: Payer: Self-pay | Admitting: Licensed Clinical Social Worker

## 2021-02-03 ENCOUNTER — Inpatient Hospital Stay: Payer: Medicare Other

## 2021-02-03 ENCOUNTER — Encounter: Payer: Self-pay | Admitting: Oncology

## 2021-02-03 ENCOUNTER — Inpatient Hospital Stay: Payer: Medicare Other | Attending: Oncology

## 2021-02-03 ENCOUNTER — Ambulatory Visit
Admission: RE | Admit: 2021-02-03 | Discharge: 2021-02-03 | Disposition: A | Discharge status: Home / Self Care | Payer: Medicare Other | Source: Ambulatory Visit | Attending: Radiation Oncology | Admitting: Radiation Oncology

## 2021-02-03 VITALS — BP 164/93 | HR 72 | Temp 99.1°F | Resp 16 | Ht 62.5 in | Wt 97.9 lb

## 2021-02-03 DIAGNOSIS — Z79899 Other long term (current) drug therapy: Secondary | ICD-10-CM | POA: Insufficient documentation

## 2021-02-03 DIAGNOSIS — M810 Age-related osteoporosis without current pathological fracture: Secondary | ICD-10-CM | POA: Diagnosis not present

## 2021-02-03 DIAGNOSIS — R918 Other nonspecific abnormal finding of lung field: Secondary | ICD-10-CM

## 2021-02-03 DIAGNOSIS — D0512 Intraductal carcinoma in situ of left breast: Secondary | ICD-10-CM | POA: Diagnosis not present

## 2021-02-03 DIAGNOSIS — Z170 Estrogen receptor positive status [ER+]: Secondary | ICD-10-CM

## 2021-02-03 DIAGNOSIS — R911 Solitary pulmonary nodule: Secondary | ICD-10-CM

## 2021-02-03 DIAGNOSIS — G4730 Sleep apnea, unspecified: Secondary | ICD-10-CM | POA: Diagnosis not present

## 2021-02-03 DIAGNOSIS — D751 Secondary polycythemia: Secondary | ICD-10-CM | POA: Insufficient documentation

## 2021-02-03 DIAGNOSIS — C50912 Malignant neoplasm of unspecified site of left female breast: Secondary | ICD-10-CM

## 2021-02-03 DIAGNOSIS — Z923 Personal history of irradiation: Secondary | ICD-10-CM | POA: Insufficient documentation

## 2021-02-03 DIAGNOSIS — Z85820 Personal history of malignant melanoma of skin: Secondary | ICD-10-CM | POA: Diagnosis not present

## 2021-02-03 DIAGNOSIS — C50412 Malignant neoplasm of upper-outer quadrant of left female breast: Secondary | ICD-10-CM

## 2021-02-03 LAB — CBC WITH DIFFERENTIAL/PLATELET
Abs Immature Granulocytes: 0.02 10*3/uL (ref 0.00–0.07)
Basophils Absolute: 0.1 10*3/uL (ref 0.0–0.1)
Basophils Relative: 1 %
Eosinophils Absolute: 0.1 10*3/uL (ref 0.0–0.5)
Eosinophils Relative: 2 %
HCT: 49.2 % — ABNORMAL HIGH (ref 36.0–46.0)
Hemoglobin: 16 g/dL — ABNORMAL HIGH (ref 12.0–15.0)
Immature Granulocytes: 0 %
Lymphocytes Relative: 25 %
Lymphs Abs: 1.5 10*3/uL (ref 0.7–4.0)
MCH: 31.6 pg (ref 26.0–34.0)
MCHC: 32.5 g/dL (ref 30.0–36.0)
MCV: 97 fL (ref 80.0–100.0)
Monocytes Absolute: 0.7 10*3/uL (ref 0.1–1.0)
Monocytes Relative: 11 %
Neutro Abs: 3.7 10*3/uL (ref 1.7–7.7)
Neutrophils Relative %: 61 %
Platelets: 198 10*3/uL (ref 150–400)
RBC: 5.07 MIL/uL (ref 3.87–5.11)
RDW: 13.5 % (ref 11.5–15.5)
WBC: 6.1 10*3/uL (ref 4.0–10.5)
nRBC: 0 % (ref 0.0–0.2)

## 2021-02-03 LAB — COMPREHENSIVE METABOLIC PANEL
ALT: 27 U/L (ref 0–44)
AST: 36 U/L (ref 15–41)
Albumin: 3.9 g/dL (ref 3.5–5.0)
Alkaline Phosphatase: 54 U/L (ref 38–126)
Anion gap: 10 (ref 5–15)
BUN: 21 mg/dL (ref 8–23)
CO2: 25 mmol/L (ref 22–32)
Calcium: 9.4 mg/dL (ref 8.9–10.3)
Chloride: 103 mmol/L (ref 98–111)
Creatinine, Ser: 0.67 mg/dL (ref 0.44–1.00)
GFR, Estimated: >60 mL/min (ref >60)
Glucose, Bld: 119 mg/dL — ABNORMAL HIGH (ref 70–99)
Potassium: 4.4 mmol/L (ref 3.5–5.1)
Sodium: 138 mmol/L (ref 135–145)
Total Bilirubin: 0.8 mg/dL (ref 0.3–1.2)
Total Protein: 7.1 g/dL (ref 6.5–8.1)

## 2021-02-03 LAB — LACTATE DEHYDROGENASE: LDH: 138 U/L (ref 98–192)

## 2021-02-03 MED ORDER — TAMOXIFEN CITRATE 20 MG PO TABS: 20.0000 mg | ORAL_TABLET | Freq: Every day | ORAL | 1 refills | Status: DC

## 2021-02-03 NOTE — Progress Notes (Signed)
Radiation Oncology Follow up Note  Name: Sabrina Zamora   Date:   02/03/2021 MRN:  [id number] DOB: Nov 05, 1930    This 87 y.o. female presents to the clinic today for 1 month follow-up status post whole breast radiation to her left breast for DCIS showing microinvasion and patient with known left lung lung nodule which we are following.  REFERRING PROVIDER: No ref. provider found  HPI: Patient is an 87 year old female now at 1 month having completed whole breast radiation to her left breast for DCIS showing microinvasion.  Seen today in routine follow-up she developed a slight rash in the superior segment of her breast.  I have asked her to discontinue all creams this may be a slight allergic reaction.  She is having no pain or discomfort specifically denies cough or bone pain.  We are also following a superior segment of the left lower lobe area of density on her chest CT although this was low metabolic activity on PET scan we will continue to follow that..  COMPLICATIONS OF TREATMENT: none  FOLLOW UP COMPLIANCE: keeps appointments   PHYSICAL EXAM:  There were no vitals taken for this visit. Lungs are clear to A&P cardiac examination essentially unremarkable with regular rate and rhythm. No dominant mass or nodularity is noted in either breast in 2 positions examined. Incision is well-healed. No axillary or supraclavicular adenopathy is appreciated. Cosmetic result is excellent.  Slight macular rash as described above.  Well-developed well-nourished patient in NAD. HEENT reveals PERLA, EOMI, discs not visualized.  Oral cavity is clear. No oral mucosal lesions are identified. Neck is clear without evidence of cervical or supraclavicular adenopathy. Lungs are clear to A&P. Cardiac examination is essentially unremarkable with regular rate and rhythm without murmur rub or thrill. Abdomen is benign with no organomegaly or masses noted. Motor sensory and DTR levels are equal and symmetric in the upper  and lower extremities. Cranial nerves II through XII are grossly intact. Proprioception is intact. No peripheral adenopathy or edema is identified. No motor or sensory levels are noted. Crude visual fields are within normal range.  RADIOLOGY RESULTS: No current films for review  PLAN: Present time for breast standpoint she is doing well.  I have asked to see her back in 3 months with a repeat CT scan of her chest.  Should she have progression of her left lower lobe superior segment lung nodule may offer SBRT at that time.  Patient is well aware of my treatment plan and recommendations.  I would like to take this opportunity to thank you for allowing me to participate in the care of your patient.Noreene Filbert, MD

## 2021-02-03 NOTE — Progress Notes (Signed)
[facility] day:  02/03/21  Chief Complaint: Sabrina Zamora is a 87 y.o. female with a history of secondary polycythemia presents for follow up .   HPI:  The patient was last seen in the medical oncology clinic on 02/04/2017.  At that time, she felt good.  Exam was unremarkable.  Hematocrit was 46.1 and hemoglobin 15.6.  She had mot required a phlebotomy since 04/2015.  Follow-up appointments were spread out.  CBC was to be checked every 6 months.   Patient previously followed up with Dr. Mike Gip.  Patient switched care to me on 11/29/2018. Extensive chart review was performed.  #Secondary polycythemia Patient has a history of sleep apnea she wears oxygen at night but no CPAP mask.  Never smoker. Work-up on 01/11/2012 reviewed and negative Jak 2 V617F mutation, exon 12 mutation. 08/25/2018 CALR negative, JAK 2 E12-15 negative, MPL negative.  Patient has been on phlebotomy program to keep hematocrit less than 48 and then later to keep it less than 50.  #History of melanoma x3.  T1 a melanoma in August 2012. saw Dermatology and had topical chemotherapy treatment to her forehead lesion.   #She has been compliant with CPAP machine. She did nocturnal oxygen testing and she is not qualified for nocturnal oxygen.   # Lung nodule  She is a never smoker.  Report second hand smoke exposure when she grew up and during the initial few years of her marriage.    Her husband stopped smoking in 1960s.   # 06/26/2020, CT without contrast showed a partial solid nodule within the superior segment of the left lower pole.  This measures 2.4 cm with a solid component measuring 5 mm.  Bronchiectasis with tree-in-bud nodularity is noted within the anterior and posterior basal portions of the right upper lobe.  10/06/2020 CT chest wo contrast - mixed solid and ground-glass nodule of  the anterior superior segment left lower lobe, with tenting of the adjacent fissural  pleura. The overall dimensions are approximately 2.8 x 2.4 cm, previously 2.6 x 2.3 cm when measured similarly, with nodular components measuring approximately 0.5 cm and 0.7 cm. Central nodular components appear somewhat more solid than on comparison prior although are not substantially changed in size a new 5 mm pulmonary nodule of the lateral segment right middle lobe, nonspecific.Stable 4 mm nodule of the anterior right lower lobe, which remains nonspecific.4. There are numerous additional unchanged clustered and tree-in-bud centrilobular nodules, particularly in the posterior right upper lobe. 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  Patient's case was discussed on tumor board on 10/15/2020.  Consensus reached about proceed with biopsy versus PET scan+ empiric SBRT given her age. 10/28/2020, PET scan did not show activity of the left lower lobe nodule.  However her tumor board discussion, CT appearance is indicated for of lung cancer.  Patient was referred to establish care with Dr. Baruch Gouty radiation oncology and was evaluated on 11/03/2020.  Dr. Cheron Schaumann recommend watchful waiting for the time being.  Should the nodule continues to solidify and grow patient will be offered empirically SBRT at that time.  Left breast DCIS with microinvasive component 09/25/20 screening mammogram showed possible distortion and calcifications in the left breast.  10/08/2020 left diagnostic mammogram showed Grouped microcalcifications in the left breast, Four circumscribed hypoechoic masses in the left breast may represent complicated cysts and are probably benign.  10/14/2020 left breast upper outer quadrant at middle depth-- intermediate grade DCIS, 50mm,  with microcalcifications.  11/25/2020, patient underwent left lumpectomy by Dr. Dahlia Byes There is microinvasive mammary carcinoma with high-grade DCIS.  Negative margins.  pTmi pNx Given her advanced age, multiple other medical  conditions, strongly ER positivity, informed decision was made to omit sentinel lymph node biopsy. Case was discussed on breast cancer tumor board.  01/14/2021 finished radiation.   INTERVAL HISTORY Sabrina Zamora is a 87 y.o. female who has above history reviewed by me today presents for follow up visit for erythrocytosis, lung nodule, left breast DCIS with microinvasive component Problems and complaints are listed below: She finished adjuvant radiation, tolerates well except skin irritation.  No new complaints.    Review of Systems  Constitutional:  Negative for appetite change, chills, fatigue and fever.  HENT:   Negative for hearing loss and voice change.   Eyes:  Negative for eye problems.  Respiratory:  Negative for chest tightness and cough.   Cardiovascular:  Negative for chest pain.  Gastrointestinal:  Negative for abdominal distention, abdominal pain and blood in stool.  Endocrine: Negative for hot flashes.  Genitourinary:  Negative for difficulty urinating and frequency.   Musculoskeletal:  Negative for arthralgias.  Skin:  Negative for itching and rash.       Skin irritation  Neurological:  Negative for extremity weakness.  Hematological:  Negative for adenopathy.  Psychiatric/Behavioral:  Negative for confusion. The patient is nervous/anxious.    Past Medical History:  Diagnosis Date   Asthma    GERD (gastroesophageal reflux disease)    High cholesterol    Hypertension    Hypothyroidism    Melanoma ([place]) 1986, 2008, 2016, 2021   melenoma left arm, left leg, back, back respectively   Osteoporosis    Polycythemia vera ([place])    Skin cancer 2012, 2016   Sleep apnea     Past Surgical History:  Procedure Laterality Date   BREAST BIOPSY Left 2003   neg   BREAST BIOPSY Right 09/27/2017   12:00 1 cmfn fibroadenomatous change    BREAST BIOPSY Right 09/17/2017   12:00 3 cmfn benign breast and fibroadipose tissue   BREAST BIOPSY Left 10/14/2020   stereo biopsy, ribbon  clip/ INTERMEDIATE GRADE DUCTAL CARCINOMA IN SITU    BREAST EXCISIONAL BIOPSY Left 1988   excisional - neg   BREAST LUMPECTOMY WITH RADIOFREQUENCY TAG IDENTIFICATION Left 02/03/3663   Procedure: BREAST LUMPECTOMY WITH RADIOFREQUENCY TAG IDENTIFICATION;  Surgeon: Jules Husbands, MD;  Location: [facility];  Service: General;  Laterality: Left;   CATARACT EXTRACTION W/PHACO Left 09/20/2017   Procedure: CATARACT EXTRACTION PHACO AND INTRAOCULAR LENS PLACEMENT ([place]) LEFT;  Surgeon: Leandrew Koyanagi, MD;  Location: [facility];  Service: Ophthalmology;  Laterality: Left;   CATARACT EXTRACTION W/PHACO Right 02/15/2018   Procedure: CATARACT EXTRACTION PHACO AND INTRAOCULAR LENS PLACEMENT ([place]) TOPICAL  RIGHT;  Surgeon: Leandrew Koyanagi, MD;  Location: [facility];  Service: Ophthalmology;  Laterality: Right;  prefers early   CHOLECYSTECTOMY     EYE SURGERY      Family History  Problem Relation Age of Onset   Clotting disorder Sister    Breast cancer Sister 59   Heart disease Mother    Heart disease Father     Social History:  reports that she has never smoked. She has never used smokeless tobacco. She reports that she does not drink alcohol and does not use drugs.  She has never smoked.  The patient is alone today.  Allergies:  Allergies  Allergen Reactions  Levofloxacin Rash and Shortness Of Breath   Penicillins Hives and Rash   Sulfa Antibiotics Rash   Tobramycin Other (See Comments)    Unknown   Betadine [Povidone Iodine] Rash   Ciprofloxacin Itching and Rash    Current Medications: Current Outpatient Medications  Medication Sig Dispense Refill   amLODipine (NORVASC) 2.5 MG tablet Take 1 tablet (2.5 mg total) by mouth daily. 45 tablet 3   aspirin EC 81 MG tablet Take 81 mg by mouth 3 (three) times a week.     Cholecalciferol (VITAMIN D) 50 MCG (2000 UT) CAPS Take 2,000 Units by mouth daily.     denosumab (PROLIA) 60 MG/ML SOSY injection Inject 60 mg into the  skin every 6 (six) months.     Fluticasone-Salmeterol (ADVAIR) 250-50 MCG/DOSE AEPB Inhale 1 puff into the lungs 2 (two) times daily. (Patient taking differently: Inhale 1 puff into the lungs at bedtime.) 60 each 3   levothyroxine (SYNTHROID) 25 MCG tablet Take 1.5 tab po Daily before breakfast (Patient taking differently: Take 37.5 mcg by mouth daily before breakfast.) 135 tablet 3   lovastatin (MEVACOR) 20 MG tablet Take 1 tablet (20 mg total) by mouth at bedtime. 90 tablet 2   Multiple Vitamin (MULTI-VITAMINS) TABS Take 1 tablet by mouth daily.     mupirocin nasal ointment (BACTROBAN) 2 % Place 1 application into the nose at bedtime. 10 g 1   Zinc 25 MG TABS Take 25 mg by mouth daily.     No current facility-administered medications for this visit.    Physical Exam: Blood pressure (!) 164/93, pulse 72, temperature 99.1 F (37.3 C), temperature source Tympanic, resp. rate 16, height 5' 2.5" (1.588 m), weight 97 lb 14.4 oz (44.4 kg), SpO2 99 %.  Physical Exam Constitutional:      General: She is not in acute distress.    Appearance: She is not diaphoretic.     Comments: Thin built, walks independently.   HENT:     Head: Normocephalic and atraumatic.     Nose: Nose normal.     Mouth/Throat:     Pharynx: No oropharyngeal exudate.  Eyes:     General: No scleral icterus.    Pupils: Pupils are equal, round, and reactive to light.  Cardiovascular:     Rate and Rhythm: Normal rate and regular rhythm.     Heart sounds: No murmur heard. Pulmonary:     Effort: Pulmonary effort is normal. No respiratory distress.     Breath sounds: No rales.  Chest:     Chest wall: No tenderness.  Abdominal:     General: There is no distension.     Palpations: Abdomen is soft.     Tenderness: There is no abdominal tenderness.  Musculoskeletal:        General: Normal range of motion.     Cervical back: Normal range of motion and neck supple.  Skin:    General: Skin is warm and dry.     Findings:  Erythema present.  Neurological:     Mental Status: She is alert and oriented to person, place, and time.     Cranial Nerves: No cranial nerve deficit.     Motor: No abnormal muscle tone.     Coordination: Coordination normal.  Psychiatric:        Mood and Affect: Affect normal.  Status post left lumpectomy, mild radiation site erythematous changes.   Appointment on 02/03/2021  Component Date Value Ref Range Status   Sodium 02/03/2021  138  135 - 145 mmol/L Final   Potassium 02/03/2021 4.4  3.5 - 5.1 mmol/L Final   Chloride 02/03/2021 103  98 - 111 mmol/L Final   CO2 02/03/2021 25  22 - 32 mmol/L Final   Glucose, Bld 02/03/2021 119 (A) 70 - 99 mg/dL Final   Glucose reference range applies only to samples taken after fasting for at least 8 hours.   BUN 02/03/2021 21  8 - 23 mg/dL Final   Creatinine, Ser 02/03/2021 0.67  0.44 - 1.00 mg/dL Final   Calcium 02/03/2021 9.4  8.9 - 10.3 mg/dL Final   Total Protein 02/03/2021 7.1  6.5 - 8.1 g/dL Final   Albumin 02/03/2021 3.9  3.5 - 5.0 g/dL Final   AST 02/03/2021 36  15 - 41 U/L Final   ALT 02/03/2021 27  0 - 44 U/L Final   Alkaline Phosphatase 02/03/2021 54  38 - 126 U/L Final   Total Bilirubin 02/03/2021 0.8  0.3 - 1.2 mg/dL Final   GFR, Estimated 02/03/2021 >60  >60 mL/min Final   Comment: (NOTE) Calculated using the CKD-EPI Creatinine Equation (2021)    Anion gap 02/03/2021 10  5 - 15 Final   Performed at [facility], [address] [place], [place] [address]   WBC 02/03/2021 6.1  4.0 - 10.5 K/uL Final   RBC 02/03/2021 5.07  3.87 - 5.11 MIL/uL Final   Hemoglobin 02/03/2021 16.0 (A) 12.0 - 15.0 g/dL Final   HCT 02/03/2021 49.2 (A) 36.0 - 46.0 % Final   MCV 02/03/2021 97.0  80.0 - 100.0 fL Final   MCH 02/03/2021 31.6  26.0 - 34.0 pg Final   MCHC 02/03/2021 32.5  30.0 - 36.0 g/dL Final   RDW 02/03/2021 13.5  11.5 - 15.5 % Final   Platelets 02/03/2021 198  150 - 400 K/uL Final   nRBC 02/03/2021 0.0  0.0 - 0.2 % Final    Neutrophils Relative % 02/03/2021 61  % Final   Neutro Abs 02/03/2021 3.7  1.7 - 7.7 K/uL Final   Lymphocytes Relative 02/03/2021 25  % Final   Lymphs Abs 02/03/2021 1.5  0.7 - 4.0 K/uL Final   Monocytes Relative 02/03/2021 11  % Final   Monocytes Absolute 02/03/2021 0.7  0.1 - 1.0 K/uL Final   Eosinophils Relative 02/03/2021 2  % Final   Eosinophils Absolute 02/03/2021 0.1  0.0 - 0.5 K/uL Final   Basophils Relative 02/03/2021 1  % Final   Basophils Absolute 02/03/2021 0.1  0.0 - 0.1 K/uL Final   Immature Granulocytes 02/03/2021 0  % Final   Abs Immature Granulocytes 02/03/2021 0.02  0.00 - 0.07 K/uL Final   Performed at [facility], [address] [place], [place] [address]   LDH 02/03/2021 138  98 - 192 U/L Final   Performed at [facility], [address] [place],  [address]    Assessment:  Sabrina Zamora is a 87 y.o. female present for follow up of erythrocytosis.  1. Ductal carcinoma in situ (DCIS) of left breast with microinvasive component   2. Secondary erythrocytosis   3. Lung nodule   4. Osteoporosis, unspecified osteoporosis type, unspecified pathological fracture presence    # erythrocytosis, presumed to be secondary to sleep apnea.  Labs reviewed and discussed with patient.  Hct is close to but still less than 50. No need for phlebotomy today.  #Lung nodule found on chest x-ray and confirmed on CT scan, no FDG activity on  PET scan. Patient has establish care with radiation oncology and was recommended watchful waiting. Dr.Chrystal plans to repeat CT in 6 months from last CT  #Left breast DCIS with microinvasive component, pTMi pN0 Status postlumpectomy.  No sentinel lymph node biopsy was done. Patient has history of osteoprosis, history of right foot fracture, on prolia  Discussed about the options of aromatase inhibitor, and tamoxifen, discussed about the common side effects and unique side effects profile. Since she has osteoporosis, I  recommend patient to be started on Tamoxifen 20mg  daily.  She is on Aspirin 81mg  .  Discussed about side effects including but not limited to vasomotor symptoms, joint pain, increase risk of cardiovascular disease, cataract, increase risk of thrombosis, increase risk of uterus. Cancer.  Recommend annual palvic examination by gyn.  She agrees to try Tamoxifen.  Rx sent.    I will see patient 4  weeks  with repeat cbc cmp, for evaluation of tolerability, possible phlebotomy.  We spent sufficient time to discuss many aspect of care, questions were answered to patient's satisfaction.   Earlie Server, MD  02/03/2021, 1:13 PM

## 2021-02-13 ENCOUNTER — Other Ambulatory Visit: Payer: Self-pay | Admitting: Internal Medicine

## 2021-02-25 ENCOUNTER — Ambulatory Visit: Payer: Medicare Other

## 2021-02-27 ENCOUNTER — Other Ambulatory Visit: Payer: Self-pay

## 2021-02-27 DIAGNOSIS — D0512 Intraductal carcinoma in situ of left breast: Secondary | ICD-10-CM

## 2021-02-27 DIAGNOSIS — C50912 Malignant neoplasm of unspecified site of left female breast: Secondary | ICD-10-CM

## 2021-03-02 ENCOUNTER — Other Ambulatory Visit: Payer: Self-pay | Admitting: Oncology

## 2021-03-02 ENCOUNTER — Inpatient Hospital Stay (HOSPITAL_BASED_OUTPATIENT_CLINIC_OR_DEPARTMENT_OTHER): Payer: Medicare Other | Admitting: Oncology

## 2021-03-02 ENCOUNTER — Inpatient Hospital Stay: Payer: Medicare Other | Attending: Oncology

## 2021-03-02 ENCOUNTER — Encounter: Payer: Self-pay | Admitting: Oncology

## 2021-03-02 ENCOUNTER — Other Ambulatory Visit: Payer: Self-pay

## 2021-03-02 VITALS — BP 140/85 | HR 78 | Temp 99.3°F | Resp 14 | Wt 98.2 lb

## 2021-03-02 DIAGNOSIS — D0512 Intraductal carcinoma in situ of left breast: Secondary | ICD-10-CM | POA: Insufficient documentation

## 2021-03-02 DIAGNOSIS — G4733 Obstructive sleep apnea (adult) (pediatric): Secondary | ICD-10-CM | POA: Diagnosis not present

## 2021-03-02 DIAGNOSIS — Z79810 Long term (current) use of selective estrogen receptor modulators (SERMs): Secondary | ICD-10-CM

## 2021-03-02 DIAGNOSIS — Z923 Personal history of irradiation: Secondary | ICD-10-CM | POA: Diagnosis not present

## 2021-03-02 DIAGNOSIS — R911 Solitary pulmonary nodule: Secondary | ICD-10-CM | POA: Diagnosis not present

## 2021-03-02 DIAGNOSIS — Z79899 Other long term (current) drug therapy: Secondary | ICD-10-CM | POA: Insufficient documentation

## 2021-03-02 DIAGNOSIS — C50912 Malignant neoplasm of unspecified site of left female breast: Secondary | ICD-10-CM

## 2021-03-02 DIAGNOSIS — Z170 Estrogen receptor positive status [ER+]: Secondary | ICD-10-CM | POA: Insufficient documentation

## 2021-03-02 DIAGNOSIS — Z85820 Personal history of malignant melanoma of skin: Secondary | ICD-10-CM | POA: Insufficient documentation

## 2021-03-02 DIAGNOSIS — D751 Secondary polycythemia: Secondary | ICD-10-CM

## 2021-03-02 LAB — CBC WITH DIFFERENTIAL/PLATELET
Abs Immature Granulocytes: 0.03 10*3/uL (ref 0.00–0.07)
Basophils Absolute: 0 10*3/uL (ref 0.0–0.1)
Basophils Relative: 1 %
Eosinophils Absolute: 0.1 10*3/uL (ref 0.0–0.5)
Eosinophils Relative: 1 %
HCT: 47.3 % — ABNORMAL HIGH (ref 36.0–46.0)
Hemoglobin: 15.8 g/dL — ABNORMAL HIGH (ref 12.0–15.0)
Immature Granulocytes: 1 %
Lymphocytes Relative: 23 %
Lymphs Abs: 1.4 10*3/uL (ref 0.7–4.0)
MCH: 31.9 pg (ref 26.0–34.0)
MCHC: 33.4 g/dL (ref 30.0–36.0)
MCV: 95.6 fL (ref 80.0–100.0)
Monocytes Absolute: 0.8 10*3/uL (ref 0.1–1.0)
Monocytes Relative: 13 %
Neutro Abs: 3.7 10*3/uL (ref 1.7–7.7)
Neutrophils Relative %: 61 %
Platelets: 187 10*3/uL (ref 150–400)
RBC: 4.95 MIL/uL (ref 3.87–5.11)
RDW: 13.3 % (ref 11.5–15.5)
WBC: 6 10*3/uL (ref 4.0–10.5)
nRBC: 0 % (ref 0.0–0.2)

## 2021-03-02 LAB — COMPREHENSIVE METABOLIC PANEL
ALT: 22 U/L (ref 0–44)
AST: 28 U/L (ref 15–41)
Albumin: 3.7 g/dL (ref 3.5–5.0)
Alkaline Phosphatase: 47 U/L (ref 38–126)
Anion gap: 6 (ref 5–15)
BUN: 21 mg/dL (ref 8–23)
CO2: 29 mmol/L (ref 22–32)
Calcium: 9 mg/dL (ref 8.9–10.3)
Chloride: 102 mmol/L (ref 98–111)
Creatinine, Ser: 0.69 mg/dL (ref 0.44–1.00)
GFR, Estimated: >60 mL/min (ref >60)
Glucose, Bld: 116 mg/dL — ABNORMAL HIGH (ref 70–99)
Potassium: 4.2 mmol/L (ref 3.5–5.1)
Sodium: 137 mmol/L (ref 135–145)
Total Bilirubin: 0.7 mg/dL (ref 0.3–1.2)
Total Protein: 6.9 g/dL (ref 6.5–8.1)

## 2021-03-02 MED ORDER — TAMOXIFEN CITRATE 20 MG PO TABS: 20.0000 mg | ORAL_TABLET | Freq: Every day | ORAL | 6 refills | Status: DC

## 2021-03-02 NOTE — Progress Notes (Signed)
[facility] day:  03/02/21  Chief Complaint: Sabrina Zamora is a 87 y.o. female with a history of secondary polycythemia presents for follow up .   HPI:  The patient was last seen in the medical oncology clinic on 02/04/2017.  At that time, she felt good.  Exam was unremarkable.  Hematocrit was 46.1 and hemoglobin 15.6.  She had mot required a phlebotomy since 04/2015.  Follow-up appointments were spread out.  CBC was to be checked every 6 months.   Patient previously followed up with Dr. Mike Gip.  Patient switched care to me on 11/29/2018. Extensive chart review was performed.  #Secondary polycythemia Patient has a history of sleep apnea she wears oxygen at night but no CPAP mask.  Never smoker. Work-up on 01/11/2012 reviewed and negative Jak 2 V617F mutation, exon 12 mutation. 08/25/2018 CALR negative, JAK 2 E12-15 negative, MPL negative.  Patient has been on phlebotomy program to keep hematocrit less than 48 and then later to keep it less than 50.  #History of melanoma x3.  T1 a melanoma in August 2012. saw Dermatology and had topical chemotherapy treatment to her forehead lesion.   #She has been compliant with CPAP machine. She did nocturnal oxygen testing and she is not qualified for nocturnal oxygen.   # Lung nodule  She is a never smoker.  Report second hand smoke exposure when she grew up and during the initial few years of her marriage.    Her husband stopped smoking in 1960s.   # 06/26/2020, CT without contrast showed a partial solid nodule within the superior segment of the left lower pole.  This measures 2.4 cm with a solid component measuring 5 mm.  Bronchiectasis with tree-in-bud nodularity is noted within the anterior and posterior basal portions of the right upper lobe.  10/06/2020 CT chest wo contrast - mixed solid and ground-glass nodule of  the anterior superior segment left lower lobe, with tenting of the adjacent fissural  pleura. The overall dimensions are approximately 2.8 x 2.4 cm, previously 2.6 x 2.3 cm when measured similarly, with nodular components measuring approximately 0.5 cm and 0.7 cm. Central nodular components appear somewhat more solid than on comparison prior although are not substantially changed in size a new 5 mm pulmonary nodule of the lateral segment right middle lobe, nonspecific.Stable 4 mm nodule of the anterior right lower lobe, which remains nonspecific.4. There are numerous additional unchanged clustered and tree-in-bud centrilobular nodules, particularly in the posterior right upper lobe. 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  Patient's case was discussed on tumor board on 10/15/2020.  Consensus reached about proceed with biopsy versus PET scan+ empiric SBRT given her age. 10/28/2020, PET scan did not show activity of the left lower lobe nodule.  However her tumor board discussion, CT appearance is indicated for of lung cancer.  Patient was referred to establish care with Dr. Baruch Gouty radiation oncology and was evaluated on 11/03/2020.  Dr. Cheron Schaumann recommend watchful waiting for the time being.  Should the nodule continues to solidify and grow patient will be offered empirically SBRT at that time.  Left breast DCIS with microinvasive component 09/25/20 screening mammogram showed possible distortion and calcifications in the left breast.  10/08/2020 left diagnostic mammogram showed Grouped microcalcifications in the left breast, Four circumscribed hypoechoic masses in the left breast may represent complicated cysts and are probably benign.  10/14/2020 left breast upper outer quadrant at middle depth-- intermediate grade DCIS, 31mm,  with microcalcifications.  11/25/2020, patient underwent left lumpectomy by Dr. Dahlia Byes There is microinvasive mammary carcinoma with high-grade DCIS.  Negative margins.  pTmi pNx Given her advanced age, multiple other medical  conditions, strongly ER positivity, informed decision was made to omit sentinel lymph node biopsy. Case was discussed on breast cancer tumor board.  01/14/2021 finished radiation.   INTERVAL HISTORY Sabrina Zamora is a 87 y.o. female who has above history reviewed by me today presents for follow up visit for erythrocytosis, lung nodule, left breast DCIS with microinvasive component She reports feeling well. Has taken Tamoxifen since last visit.  She reports tolerating well, no significant side effects.  No hot flash She is compliant with CPAP machine.  She takes Aspirin 81mg  every other day.    Review of Systems  Constitutional:  Negative for appetite change, chills, fatigue and fever.  HENT:   Negative for hearing loss and voice change.   Eyes:  Negative for eye problems.  Respiratory:  Negative for chest tightness and cough.   Cardiovascular:  Negative for chest pain.  Gastrointestinal:  Negative for abdominal distention, abdominal pain and blood in stool.  Endocrine: Negative for hot flashes.  Genitourinary:  Negative for difficulty urinating and frequency.   Musculoskeletal:  Negative for arthralgias.  Skin:  Negative for itching and rash.  Neurological:  Negative for extremity weakness.  Hematological:  Negative for adenopathy.  Psychiatric/Behavioral:  Negative for confusion.    Past Medical History:  Diagnosis Date   Asthma    GERD (gastroesophageal reflux disease)    High cholesterol    Hypertension    Hypothyroidism    Melanoma ([place]) 1986, 2008, 2016, 2021   melenoma left arm, left leg, back, back respectively   Osteoporosis    Polycythemia vera ([place])    Skin cancer 2012, 2016   Sleep apnea     Past Surgical History:  Procedure Laterality Date   BREAST BIOPSY Left 2003   neg   BREAST BIOPSY Right 09/27/2017   12:00 1 cmfn fibroadenomatous change    BREAST BIOPSY Right 09/17/2017   12:00 3 cmfn benign breast and fibroadipose tissue   BREAST BIOPSY Left  10/14/2020   stereo biopsy, ribbon clip/ INTERMEDIATE GRADE DUCTAL CARCINOMA IN SITU    BREAST EXCISIONAL BIOPSY Left 1988   excisional - neg   BREAST LUMPECTOMY WITH RADIOFREQUENCY TAG IDENTIFICATION Left 0/02/6760   Procedure: BREAST LUMPECTOMY WITH RADIOFREQUENCY TAG IDENTIFICATION;  Surgeon: Jules Husbands, MD;  Location: [facility];  Service: General;  Laterality: Left;   CATARACT EXTRACTION W/PHACO Left 09/20/2017   Procedure: CATARACT EXTRACTION PHACO AND INTRAOCULAR LENS PLACEMENT ([place]) LEFT;  Surgeon: Leandrew Koyanagi, MD;  Location: [facility];  Service: Ophthalmology;  Laterality: Left;   CATARACT EXTRACTION W/PHACO Right 02/15/2018   Procedure: CATARACT EXTRACTION PHACO AND INTRAOCULAR LENS PLACEMENT ([place]) TOPICAL  RIGHT;  Surgeon: Leandrew Koyanagi, MD;  Location: [facility]  Service: Ophthalmology;  Laterality: Right;  prefers early   CHOLECYSTECTOMY     EYE SURGERY      Family History  Problem Relation Age of Onset   Clotting disorder Sister    Breast cancer Sister 63   Heart disease Mother    Heart disease Father     Social History:  reports that she has never smoked. She has never used smokeless tobacco. She reports that she does not drink alcohol and does not use drugs.  She has never smoked.  The patient is alone today.  Allergies:  Allergies  Allergen Reactions   Levofloxacin Rash and Shortness Of Breath   Penicillins Hives and Rash   Sulfa Antibiotics Rash   Tobramycin Other (See Comments)    Unknown   Betadine [Povidone Iodine] Rash   Ciprofloxacin Itching and Rash    Current Medications: Current Outpatient Medications  Medication Sig Dispense Refill   amLODipine (NORVASC) 2.5 MG tablet Take 1 tablet (2.5 mg total) by mouth daily. 45 tablet 3   aspirin EC 81 MG tablet Take 81 mg by mouth 3 (three) times a week.     Cholecalciferol (VITAMIN D) 50 MCG (2000 UT) CAPS Take 2,000 Units by mouth daily.     denosumab (PROLIA) 60 MG/ML SOSY  injection Inject 60 mg into the skin every 6 (six) months.     Fluticasone-Salmeterol (ADVAIR) 250-50 MCG/DOSE AEPB Inhale 1 puff into the lungs 2 (two) times daily. (Patient taking differently: Inhale 1 puff into the lungs at bedtime.) 60 each 3   levothyroxine (SYNTHROID) 25 MCG tablet Take 1.5 tab po Daily before breakfast (Patient taking differently: Take 37.5 mcg by mouth daily before breakfast.) 135 tablet 3   lovastatin (MEVACOR) 20 MG tablet TAKE 1 TABLET BY MOUTH NIGHTLY 90 tablet 2   Multiple Vitamin (MULTI-VITAMINS) TABS Take 1 tablet by mouth daily.     mupirocin nasal ointment (BACTROBAN) 2 % Place 1 application into the nose at bedtime. 10 g 1   tamoxifen (NOLVADEX) 20 MG tablet Take 1 tablet (20 mg total) by mouth daily. 30 tablet 1   Zinc 25 MG TABS Take 25 mg by mouth daily.     No current facility-administered medications for this visit.    Physical Exam: Blood pressure 140/85, pulse 78, temperature 99.3 F (37.4 C), resp. rate 14, weight 98 lb 3.2 oz (44.5 kg), SpO2 96 %.  Physical Exam Constitutional:      General: She is not in acute distress.    Appearance: She is not diaphoretic.     Comments: Thin built, walks independently.   HENT:     Head: Normocephalic and atraumatic.     Nose: Nose normal.     Mouth/Throat:     Pharynx: No oropharyngeal exudate.  Eyes:     General: No scleral icterus.    Pupils: Pupils are equal, round, and reactive to light.  Cardiovascular:     Rate and Rhythm: Normal rate and regular rhythm.     Heart sounds: No murmur heard. Pulmonary:     Effort: Pulmonary effort is normal. No respiratory distress.     Breath sounds: No rales.  Chest:     Chest wall: No tenderness.  Abdominal:     General: There is no distension.     Palpations: Abdomen is soft.     Tenderness: There is no abdominal tenderness.  Musculoskeletal:        General: Normal range of motion.     Cervical back: Normal range of motion and neck supple.  Skin:     General: Skin is warm and dry.     Findings: Erythema present.  Neurological:     Mental Status: She is alert and oriented to person, place, and time.     Cranial Nerves: No cranial nerve deficit.     Motor: No abnormal muscle tone.     Coordination: Coordination normal.  Psychiatric:        Mood and Affect: Affect normal.  Status post left lumpectomy, mild radiation site erythematous changes.   Appointment on  03/02/2021  Component Date Value Ref Range Status   Sodium 03/02/2021 137  135 - 145 mmol/L Final   Potassium 03/02/2021 4.2  3.5 - 5.1 mmol/L Final   Chloride 03/02/2021 102  98 - 111 mmol/L Final   CO2 03/02/2021 29  22 - 32 mmol/L Final   Glucose, Bld 03/02/2021 116 (A) 70 - 99 mg/dL Final   Glucose reference range applies only to samples taken after fasting for at least 8 hours.   BUN 03/02/2021 21  8 - 23 mg/dL Final   Creatinine, Ser 03/02/2021 0.69  0.44 - 1.00 mg/dL Final   Calcium 03/02/2021 9.0  8.9 - 10.3 mg/dL Final   Total Protein 03/02/2021 6.9  6.5 - 8.1 g/dL Final   Albumin 03/02/2021 3.7  3.5 - 5.0 g/dL Final   AST 03/02/2021 28  15 - 41 U/L Final   ALT 03/02/2021 22  0 - 44 U/L Final   Alkaline Phosphatase 03/02/2021 47  38 - 126 U/L Final   Total Bilirubin 03/02/2021 0.7  0.3 - 1.2 mg/dL Final   GFR, Estimated 03/02/2021 >60  >60 mL/min Final   Comment: (NOTE) Calculated using the CKD-EPI Creatinine Equation (2021)    Anion gap 03/02/2021 6  5 - 15 Final   Performed at [facility], [place], [place] [address]   WBC 03/02/2021 6.0  4.0 - 10.5 K/uL Final   RBC 03/02/2021 4.95  3.87 - 5.11 MIL/uL Final   Hemoglobin 03/02/2021 15.8 (A) 12.0 - 15.0 g/dL Final   HCT 03/02/2021 47.3 (A) 36.0 - 46.0 % Final   MCV 03/02/2021 95.6  80.0 - 100.0 fL Final   MCH 03/02/2021 31.9  26.0 - 34.0 pg Final   MCHC 03/02/2021 33.4  30.0 - 36.0 g/dL Final   RDW 03/02/2021 13.3  11.5 - 15.5 % Final   Platelets 03/02/2021 187  150 - 400 K/uL Final    nRBC 03/02/2021 0.0  0.0 - 0.2 % Final   Neutrophils Relative % 03/02/2021 61  % Final   Neutro Abs 03/02/2021 3.7  1.7 - 7.7 K/uL Final   Lymphocytes Relative 03/02/2021 23  % Final   Lymphs Abs 03/02/2021 1.4  0.7 - 4.0 K/uL Final   Monocytes Relative 03/02/2021 13  % Final   Monocytes Absolute 03/02/2021 0.8  0.1 - 1.0 K/uL Final   Eosinophils Relative 03/02/2021 1  % Final   Eosinophils Absolute 03/02/2021 0.1  0.0 - 0.5 K/uL Final   Basophils Relative 03/02/2021 1  % Final   Basophils Absolute 03/02/2021 0.0  0.0 - 0.1 K/uL Final   Immature Granulocytes 03/02/2021 1  % Final   Abs Immature Granulocytes 03/02/2021 0.03  0.00 - 0.07 K/uL Final   Performed at [facility], [address] [place], [place] [address]    Assessment:  Sabrina Zamora is a 87 y.o. female present for follow up of erythrocytosis.  1. Ductal carcinoma in situ (DCIS) of left breast with microinvasive component   2. Secondary erythrocytosis   3. Lung nodule   4. Obstructive sleep apnea   5. Long-term current use of tamoxifen    #Lung nodule found on chest x-ray and confirmed on CT scan, no FDG activity on PET scan. Patient has establish care with radiation oncology and was recommended watchful waiting. Dr.Chrystal plans to repeat CT in 6 months from last CT- scheduled in Oct 2022  #Left breast DCIS with microinvasive component, pTMi pN0 Status postlumpectomy.  No  sentinel lymph node biopsy was done. S/p RT Tolerates Tamoxifen, recommend patient to continue. Plan 5 years.  Recommend patient to take Asprin 81mg  daily for thrombosis prophylaxis.  Annual mammogram - March 2023 Annual gyn follow up.   # history of osteoprosis, history of right foot fracture, on prolia - not managed by me  # erythrocytosis, presumed to be secondary to sleep apnea.  Labs are reviewed and discussed with patient. Phlebotomy PRN Hct >50.  No need for phlebotomy today.    Follow up plan H&H in 6 weeks +/- Phlebotomy 3  months lab/NP/+/-Phlebotomy (cbc cmp) 6 months lab/MD+/- Phlebotomy (cbc cmp)   We spent sufficient time to discuss many aspect of care, questions were answered to patient's satisfaction.   Earlie Server, MD  03/02/2021, 6:02 PM

## 2021-03-04 ENCOUNTER — Ambulatory Visit: Payer: Medicare Other

## 2021-03-04 ENCOUNTER — Ambulatory Visit: Payer: Medicare Other | Admitting: Oncology

## 2021-03-04 ENCOUNTER — Other Ambulatory Visit: Payer: Medicare Other

## 2021-03-16 ENCOUNTER — Other Ambulatory Visit: Payer: Self-pay | Admitting: Nurse Practitioner

## 2021-03-16 DIAGNOSIS — J449 Chronic obstructive pulmonary disease, unspecified: Secondary | ICD-10-CM

## 2021-03-17 ENCOUNTER — Other Ambulatory Visit: Payer: Self-pay | Admitting: Internal Medicine

## 2021-03-17 DIAGNOSIS — J449 Chronic obstructive pulmonary disease, unspecified: Secondary | ICD-10-CM

## 2021-03-18 ENCOUNTER — Other Ambulatory Visit: Payer: Self-pay

## 2021-03-18 ENCOUNTER — Ambulatory Visit (INDEPENDENT_AMBULATORY_CARE_PROVIDER_SITE_OTHER): Payer: Medicare Other | Admitting: Surgery

## 2021-03-18 ENCOUNTER — Encounter: Payer: Self-pay | Admitting: Surgery

## 2021-03-18 VITALS — BP 161/84 | HR 70 | Temp 98.1°F | Ht 62.5 in | Wt 102.0 lb

## 2021-03-18 DIAGNOSIS — D0512 Intraductal carcinoma in situ of left breast: Secondary | ICD-10-CM | POA: Diagnosis not present

## 2021-03-18 NOTE — Patient Instructions (Addendum)
The patient has been asked to return to the office in 6 months with a bilateral diagnostic mammogram.  We will send you a letter about these appointments.   Continue self breast exams. Call office for any new breast issues or concerns.

## 2021-03-20 ENCOUNTER — Encounter: Payer: Self-pay | Admitting: Surgery

## 2021-03-20 NOTE — Progress Notes (Signed)
Outpatient Surgical Follow Up  03/20/2021  Sabrina Zamora is an 87 y.o. female.   Chief Complaint  Patient presents with   Follow-up    HPI: Sabrina Zamora is an 87 year old female well-known to me with history of ER positive high-grade DCIS and microinvasion , patient chose not to proceed with sentinel lymph node biopsy. She is tolerating hormonal therapy well. She had a lot of questions regarding COVID vaccination and is bruised.  She denies any prior side effects to previous COVID vaccinations.  She denies any issues regarding her breast.  No fevers no chills no new masses no breast pain or lumps.  Past Medical History:  Diagnosis Date   Asthma    GERD (gastroesophageal reflux disease)    High cholesterol    Hypertension    Hypothyroidism    Melanoma ([place]) 1986, 2008, 2016, 2021   melenoma left arm, left leg, back, back respectively   Osteoporosis    Polycythemia vera ([place])    Skin cancer 2012, 2016   Sleep apnea     Past Surgical History:  Procedure Laterality Date   BREAST BIOPSY Left 2003   neg   BREAST BIOPSY Right 09/27/2017   12:00 1 cmfn fibroadenomatous change    BREAST BIOPSY Right 09/17/2017   12:00 3 cmfn benign breast and fibroadipose tissue   BREAST BIOPSY Left 10/14/2020   stereo biopsy, ribbon clip/ INTERMEDIATE GRADE DUCTAL CARCINOMA IN SITU    BREAST EXCISIONAL BIOPSY Left 1988   excisional - neg   BREAST LUMPECTOMY WITH RADIOFREQUENCY TAG IDENTIFICATION Left 5/91/6384   Procedure: BREAST LUMPECTOMY WITH RADIOFREQUENCY TAG IDENTIFICATION;  Surgeon: Jules Husbands, MD;  Location: [facility];  Service: General;  Laterality: Left;   CATARACT EXTRACTION W/PHACO Left 09/20/2017   Procedure: CATARACT EXTRACTION PHACO AND INTRAOCULAR LENS PLACEMENT ([place]) LEFT;  Surgeon: Leandrew Koyanagi, MD;  Location: [place];  Service: Ophthalmology;  Laterality: Left;   CATARACT EXTRACTION W/PHACO Right 02/15/2018   Procedure: CATARACT EXTRACTION PHACO AND INTRAOCULAR  LENS PLACEMENT ([place]) TOPICAL  RIGHT;  Surgeon: Leandrew Koyanagi, MD;  Location: [place];  Service: Ophthalmology;  Laterality: Right;  prefers early   CHOLECYSTECTOMY     EYE SURGERY      Family History  Problem Relation Age of Onset   Clotting disorder Sister    Breast cancer Sister 53   Heart disease Mother    Heart disease Father     Social History:  reports that she has never smoked. She has never used smokeless tobacco. She reports that she does not drink alcohol and does not use drugs.  Allergies:  Allergies  Allergen Reactions   Levofloxacin Rash and Shortness Of Breath   Penicillins Hives and Rash   Sulfa Antibiotics Rash   Tobramycin Other (See Comments)    Unknown   Betadine [Povidone Iodine] Rash   Ciprofloxacin Itching and Rash    Medications reviewed.    ROS Full ROS performed and is otherwise negative other than what is stated in HPI   BP (!) 161/84   Pulse 70   Temp 98.1 F (36.7 C)   Ht 5' 2.5" (1.588 m)   Wt 102 lb (46.3 kg)   SpO2 96%   BMI 18.36 kg/m   Physical Exam Vitals and nursing note reviewed. Exam conducted with a chaperone present.  Constitutional:      General: She is not in acute distress.    Appearance: Normal appearance. She is normal weight. She is not ill-appearing.  Eyes:     General: No scleral icterus.       Right eye: No discharge.        Left eye: No discharge.  Cardiovascular:     Rate and Rhythm: Normal rate and regular rhythm.     Heart sounds: No murmur heard.   No friction rub.  Pulmonary:     Effort: Pulmonary effort is normal. No respiratory distress.     Breath sounds: Normal breath sounds. No stridor. No wheezing.     Comments: Breast: Evidence of prior lumpectomy in the left side there is no evidence of any significant deformity on the left breast or any new palpable lesions.  There is no evidence of lymphadenopathy on either side.  No evidence of any lesions on the right breast. Abdominal:      General: Abdomen is flat. There is no distension.     Palpations: There is no mass.     Tenderness: There is no abdominal tenderness. There is no guarding or rebound.     Hernia: No hernia is present.  Musculoskeletal:        General: No swelling, tenderness or deformity. Normal range of motion.     Cervical back: Normal range of motion and neck supple. No rigidity or tenderness.  Lymphadenopathy:     Cervical: No cervical adenopathy.  Skin:    General: Skin is warm and dry.     Capillary Refill: Capillary refill takes less than 2 seconds.     Coloration: Skin is not jaundiced or pale.     Findings: No bruising or erythema.  Neurological:     General: No focal deficit present.     Mental Status: She is alert and oriented to person, place, and time.  Psychiatric:        Mood and Affect: Mood normal.        Behavior: Behavior normal.        Thought Content: Thought content normal.        Judgment: Judgment normal.       No results found for this or any previous visit (from the past 48 hour(s)). No results found.  Assessment/Plan: Sabrina Zamora is an 87 year old female with history of DCIS left breast status postlumpectomy.  No evidence of recurrence.  She is tolerating hormonal therapy well.  Most of the counseling today was regarding COVID boost and vaccination.  Given her age and risk factor I do recommend another boost.  She expresses understanding.  We will see her back in about 6 months or so with mammogram and physical exam.  She is very appreciative   Greater than 50% of the 32minutes  visit was spent in counseling/coordination of care   Caroleen Hamman, MD [facility]

## 2021-03-25 ENCOUNTER — Ambulatory Visit: Payer: Medicare Other

## 2021-03-25 DIAGNOSIS — D2261 Melanocytic nevi of right upper limb, including shoulder: Secondary | ICD-10-CM | POA: Diagnosis not present

## 2021-03-25 DIAGNOSIS — Z85828 Personal history of other malignant neoplasm of skin: Secondary | ICD-10-CM | POA: Diagnosis not present

## 2021-03-25 DIAGNOSIS — M810 Age-related osteoporosis without current pathological fracture: Secondary | ICD-10-CM | POA: Diagnosis not present

## 2021-03-25 DIAGNOSIS — L821 Other seborrheic keratosis: Secondary | ICD-10-CM | POA: Diagnosis not present

## 2021-03-25 DIAGNOSIS — Z85820 Personal history of malignant melanoma of skin: Secondary | ICD-10-CM | POA: Diagnosis not present

## 2021-03-25 DIAGNOSIS — D225 Melanocytic nevi of trunk: Secondary | ICD-10-CM | POA: Diagnosis not present

## 2021-03-25 DIAGNOSIS — D2271 Melanocytic nevi of right lower limb, including hip: Secondary | ICD-10-CM | POA: Diagnosis not present

## 2021-03-25 DIAGNOSIS — D2272 Melanocytic nevi of left lower limb, including hip: Secondary | ICD-10-CM | POA: Diagnosis not present

## 2021-04-01 DIAGNOSIS — M810 Age-related osteoporosis without current pathological fracture: Secondary | ICD-10-CM | POA: Diagnosis not present

## 2021-04-03 ENCOUNTER — Other Ambulatory Visit: Payer: Self-pay | Admitting: Nurse Practitioner

## 2021-04-15 ENCOUNTER — Other Ambulatory Visit: Payer: Self-pay

## 2021-04-15 ENCOUNTER — Inpatient Hospital Stay: Payer: Medicare Other | Attending: Oncology

## 2021-04-15 ENCOUNTER — Inpatient Hospital Stay: Payer: Medicare Other

## 2021-04-15 DIAGNOSIS — D0512 Intraductal carcinoma in situ of left breast: Secondary | ICD-10-CM | POA: Diagnosis not present

## 2021-04-15 DIAGNOSIS — C50912 Malignant neoplasm of unspecified site of left female breast: Secondary | ICD-10-CM

## 2021-04-15 DIAGNOSIS — D751 Secondary polycythemia: Secondary | ICD-10-CM

## 2021-04-15 LAB — HEMOGLOBIN AND HEMATOCRIT, BLOOD
HCT: 48.3 % — ABNORMAL HIGH (ref 36.0–46.0)
Hemoglobin: 15.8 g/dL — ABNORMAL HIGH (ref 12.0–15.0)

## 2021-04-15 NOTE — Progress Notes (Signed)
Hematocrit less than 50; No phlebotomy per MD parameters. Pt informed.

## 2021-04-27 ENCOUNTER — Other Ambulatory Visit: Payer: Self-pay

## 2021-04-27 ENCOUNTER — Ambulatory Visit (INDEPENDENT_AMBULATORY_CARE_PROVIDER_SITE_OTHER): Payer: Medicare Other | Admitting: Urology

## 2021-04-27 ENCOUNTER — Other Ambulatory Visit: Payer: Self-pay | Admitting: *Deleted

## 2021-04-27 ENCOUNTER — Other Ambulatory Visit
Admission: RE | Admit: 2021-04-27 | Discharge: 2021-04-27 | Disposition: A | Discharge status: Home / Self Care | Payer: Medicare Other | Attending: Urology | Admitting: Urology

## 2021-04-27 ENCOUNTER — Encounter: Payer: Self-pay | Admitting: Urology

## 2021-04-27 VITALS — BP 170/90 | HR 82 | Ht 62.5 in | Wt 101.0 lb

## 2021-04-27 DIAGNOSIS — N390 Urinary tract infection, site not specified: Secondary | ICD-10-CM

## 2021-04-27 DIAGNOSIS — R300 Dysuria: Secondary | ICD-10-CM | POA: Insufficient documentation

## 2021-04-27 DIAGNOSIS — R310 Gross hematuria: Secondary | ICD-10-CM | POA: Diagnosis not present

## 2021-04-27 LAB — URINALYSIS, COMPLETE (UACMP) WITH MICROSCOPIC
Bilirubin Urine: NEGATIVE
Glucose, UA: NEGATIVE mg/dL
Ketones, ur: NEGATIVE mg/dL
Nitrite: NEGATIVE
Protein, ur: NEGATIVE mg/dL
Specific Gravity, Urine: 1.01 (ref 1.005–1.030)
pH: 6.5 (ref 5.0–8.0)

## 2021-04-27 LAB — BLADDER SCAN AMB NON-IMAGING

## 2021-04-27 NOTE — Progress Notes (Signed)
04/27/2021 1:24 PM   Kanijah M Grimmett August 11, 1930 [id number]  Referring provider: No referring provider defined for this encounter.  Chief Complaint  Patient presents with   Urinary Tract Infection   Urological history: 1. rUTI's -contributing factors of age, colonization and vaginal atrophy -cysto 2021 - NED -documented positive urine cultures over the last year  None -managed with preventative strategies  HPI: Sabrina Zamora is a 87 y.o. female who presents today for symptoms of burning with urination and blood in the urine.  UA 6-10 WBC's and rare bacteria.    PVR 145 mL    She states over the weekend she developed some mild dysuria and then on Sunday she had an episode of gross hematuria followed by a rust colored urine and then complete clearance of blood in the urine.  She is also been having a few days of lower back pain.  Patient denies any modifying or aggravating factors.  Patient denies any suprapubic/flank pain.  Patient denies any fevers, chills, nausea or vomiting.    PMH: Past Medical History:  Diagnosis Date   Asthma    GERD (gastroesophageal reflux disease)    High cholesterol    Hypertension    Hypothyroidism    Melanoma ([place]) 1986, 2008, 2016, 2021   melenoma left arm, left leg, back, back respectively   Osteoporosis    Polycythemia vera ([place])    Skin cancer 2012, 2016   Sleep apnea     Surgical History: Past Surgical History:  Procedure Laterality Date   BREAST BIOPSY Left 2003   neg   BREAST BIOPSY Right 09/27/2017   12:00 1 cmfn fibroadenomatous change    BREAST BIOPSY Right 09/17/2017   12:00 3 cmfn benign breast and fibroadipose tissue   BREAST BIOPSY Left 10/14/2020   stereo biopsy, ribbon clip/ INTERMEDIATE GRADE DUCTAL CARCINOMA IN SITU    BREAST EXCISIONAL BIOPSY Left 1988   excisional - neg   BREAST LUMPECTOMY WITH RADIOFREQUENCY TAG IDENTIFICATION Left 4/48/1856   Procedure: BREAST LUMPECTOMY WITH RADIOFREQUENCY TAG  IDENTIFICATION;  Surgeon: Jules Husbands, MD;  Location: [facility];  Service: General;  Laterality: Left;   CATARACT EXTRACTION W/PHACO Left 09/20/2017   Procedure: CATARACT EXTRACTION PHACO AND INTRAOCULAR LENS PLACEMENT ([place]) LEFT;  Surgeon: Leandrew Koyanagi, MD;  Location: [place];  Service: Ophthalmology;  Laterality: Left;   CATARACT EXTRACTION W/PHACO Right 02/15/2018   Procedure: CATARACT EXTRACTION PHACO AND INTRAOCULAR LENS PLACEMENT ([place]) TOPICAL  RIGHT;  Surgeon: Leandrew Koyanagi, MD;  Location: [place];  Service: Ophthalmology;  Laterality: Right;  prefers early   CHOLECYSTECTOMY     EYE SURGERY      Home Medications:  Allergies as of 04/27/2021       Reactions   Levofloxacin Rash, Shortness Of Breath   Penicillins Hives, Rash   Sulfa Antibiotics Rash   Tobramycin Other (See Comments)   Unknown   Betadine [povidone Iodine] Rash   Ciprofloxacin Itching, Rash        Medication List        Accurate as of April 27, 2021  1:24 PM. If you have any questions, ask your nurse or doctor.          STOP taking these medications    mupirocin nasal ointment 2 % Commonly known as: BACTROBAN Stopped by: Zara Council, PA-C       TAKE these medications    amLODipine 2.5 MG tablet Commonly known as: NORVASC Take 1 tablet (2.5 mg total)  by mouth daily.   aspirin EC 81 MG tablet Take 81 mg by mouth 3 (three) times a week.   denosumab 60 MG/ML Sosy injection Commonly known as: PROLIA Inject 60 mg into the skin every 6 (six) months.   fluticasone-salmeterol 250-50 MCG/ACT Aepb Commonly known as: ADVAIR INHALE 1 PUFF INTO THE LUNGS TWICE DAILY   levothyroxine 25 MCG tablet Commonly known as: SYNTHROID Take 1.5 tab po Daily before breakfast   lovastatin 20 MG tablet Commonly known as: MEVACOR TAKE 1 TABLET BY MOUTH NIGHTLY   Multi-Vitamins Tabs Take 1 tablet by mouth daily.   tamoxifen 20 MG tablet Commonly known as:  NOLVADEX Take 1 tablet (20 mg total) by mouth daily.   Vitamin D 50 MCG (2000 UT) Caps Take 2,000 Units by mouth daily.   Zinc 25 MG Tabs Take 25 mg by mouth daily.        Allergies:  Allergies  Allergen Reactions   Levofloxacin Rash and Shortness Of Breath   Penicillins Hives and Rash   Sulfa Antibiotics Rash   Tobramycin Other (See Comments)    Unknown   Betadine [Povidone Iodine] Rash   Ciprofloxacin Itching and Rash    Family History: Family History  Problem Relation Age of Onset   Clotting disorder Sister    Breast cancer Sister 37   Heart disease Mother    Heart disease Father     Social History:  reports that she has never smoked. She has never used smokeless tobacco. She reports that she does not drink alcohol and does not use drugs.  ROS: Pertinent ROS in HPI  Physical Exam: BP (!) 170/90   Pulse 82   Ht 5' 2.5" (1.588 m)   Wt 101 lb (45.8 kg)   BMI 18.18 kg/m   Constitutional:  Well nourished. Alert and oriented, No acute distress. HEENT: [place] AT, mask in place.  Trachea midline Cardiovascular: No clubbing, cyanosis, or edema. Respiratory: Normal respiratory effort, no increased work of breathing. Neurologic: Grossly intact, no focal deficits, moving all 4 extremities. Psychiatric: Normal mood and affect.    Laboratory Data: Lab Results  Component Value Date   WBC 6.0 03/02/2021   HGB 15.8 (H) 04/15/2021   HCT 48.3 (H) 04/15/2021   MCV 95.6 03/02/2021   PLT 187 03/02/2021    Lab Results  Component Value Date   CREATININE 0.69 03/02/2021   Lab Results  Component Value Date   TSH 3.710 05/13/2020      Component Value Date/Time   CHOL 194 05/13/2020 0756   HDL 76 05/13/2020 0756   CHOLHDL 2.7 05/10/2018 0807   LDLCALC 104 (H) 05/13/2020 0756    Lab Results  Component Value Date   AST 28 03/02/2021   Lab Results  Component Value Date   ALT 22 03/02/2021    Urinalysis Component     Latest Ref Rng & Units 04/27/2021  Color,  Urine     YELLOW YELLOW  Appearance     CLEAR CLEAR  Specific Gravity, Urine     1.005 - 1.030 1.010  pH     5.0 - 8.0 6.5  Glucose, UA     NEGATIVE mg/dL NEGATIVE  Hgb urine dipstick     NEGATIVE TRACE (A)  Bilirubin Urine     NEGATIVE NEGATIVE  Ketones, ur     NEGATIVE mg/dL NEGATIVE  Protein     NEGATIVE mg/dL NEGATIVE  Nitrite     NEGATIVE NEGATIVE  Leukocytes,Ua  NEGATIVE SMALL (A)  Squamous Epithelial / LPF     0 - 5 0-5  WBC, UA     0 - 5 WBC/hpf 6-10  RBC / HPF     0 - 5 RBC/hpf 0-5  Bacteria, UA     NONE SEEN RARE (A)  I have reviewed the labs.   Pertinent Imaging: Results for SHARRIE, SELF (MRN [id number]) as of 04/27/2021 13:14  Ref. Range 04/27/2021 13:03  Scan Result Unknown 14ml    Assessment & Plan:    1. Gross hematuria - I explained to the patient that there are a number of causes that can be associated with blood in the urine, such as stones, UTI's, damage to the urinary tract and/or cancer -if urine culture is negative, will need to pursue CT urogram - UA - Urine culture  2. Dysuria -may be secondary to UTI -Urine cultures pending at this time, we will hold on prescribing antibiotic until urine culture results are available -If urine culture is negative please refer to #1     Return for pending urine cutlure results .  These notes generated with voice recognition software. I apologize for typographical errors.  Zara Council, PA-C  [facility] [address]  [place], [place] [address] [phone]

## 2021-04-28 LAB — URINE CULTURE

## 2021-05-01 ENCOUNTER — Other Ambulatory Visit: Payer: Self-pay

## 2021-05-01 ENCOUNTER — Ambulatory Visit (INDEPENDENT_AMBULATORY_CARE_PROVIDER_SITE_OTHER): Payer: Medicare Other

## 2021-05-01 DIAGNOSIS — R300 Dysuria: Secondary | ICD-10-CM | POA: Diagnosis not present

## 2021-05-01 LAB — URINALYSIS, COMPLETE
Bilirubin, UA: NEGATIVE
Glucose, UA: NEGATIVE
Nitrite, UA: NEGATIVE
Protein,UA: NEGATIVE
RBC, UA: NEGATIVE
Specific Gravity, UA: 1.015 (ref 1.005–1.030)
Urobilinogen, Ur: 0.2 mg/dL (ref 0.2–1.0)
pH, UA: 7 (ref 5.0–7.5)

## 2021-05-01 LAB — MICROSCOPIC EXAMINATION: Epithelial Cells (non renal): >10 /hpf — AB (ref 0–10)

## 2021-05-01 NOTE — Progress Notes (Signed)
In and Out Catheterization  Patient is present today for a I & O catheterization due to clean catch. Patient was cleaned and prepped in a sterile fashion with betadine . A 16FR cath was inserted no complications were noted , 50 ml of urine return was noted, urine was yellow in color. A clean urine sample was collected for urinalysis and culture. Bladder was drained  And catheter was removed with out difficulty.    Performed by: Kerman Passey, RMA  Follow up/ Additional notes: NA

## 2021-05-04 ENCOUNTER — Ambulatory Visit
Admission: RE | Admit: 2021-05-04 | Discharge: 2021-05-04 | Disposition: A | Discharge status: Home / Self Care | Payer: Medicare Other | Source: Ambulatory Visit | Attending: Radiation Oncology | Admitting: Radiation Oncology

## 2021-05-04 ENCOUNTER — Other Ambulatory Visit: Payer: Self-pay

## 2021-05-04 DIAGNOSIS — R911 Solitary pulmonary nodule: Secondary | ICD-10-CM | POA: Diagnosis not present

## 2021-05-04 DIAGNOSIS — R918 Other nonspecific abnormal finding of lung field: Secondary | ICD-10-CM | POA: Diagnosis not present

## 2021-05-04 DIAGNOSIS — I700 Atherosclerosis of aorta: Secondary | ICD-10-CM | POA: Diagnosis not present

## 2021-05-04 DIAGNOSIS — J479 Bronchiectasis, uncomplicated: Secondary | ICD-10-CM | POA: Diagnosis not present

## 2021-05-04 LAB — POCT I-STAT CREATININE: Creatinine, Ser: 0.8 mg/dL (ref 0.44–1.00)

## 2021-05-04 MED ORDER — IOHEXOL 300 MG/ML  SOLN
60.0000 mL | Freq: Once | INTRAMUSCULAR | Status: AC | PRN
  Administered 2021-05-04: 60 mL via INTRAVENOUS

## 2021-05-05 LAB — CULTURE, URINE COMPREHENSIVE

## 2021-05-08 ENCOUNTER — Other Ambulatory Visit: Payer: Self-pay

## 2021-05-08 ENCOUNTER — Ambulatory Visit
Admission: RE | Admit: 2021-05-08 | Discharge: 2021-05-08 | Disposition: A | Discharge status: Home / Self Care | Payer: Medicare Other | Source: Ambulatory Visit | Attending: Radiation Oncology | Admitting: Radiation Oncology

## 2021-05-08 ENCOUNTER — Ambulatory Visit: Payer: Medicare Other | Admitting: Radiation Oncology

## 2021-05-08 VITALS — BP 174/97 | HR 84 | Temp 96.6°F | Wt 100.0 lb

## 2021-05-08 DIAGNOSIS — D0512 Intraductal carcinoma in situ of left breast: Secondary | ICD-10-CM | POA: Insufficient documentation

## 2021-05-08 DIAGNOSIS — Z923 Personal history of irradiation: Secondary | ICD-10-CM | POA: Diagnosis not present

## 2021-05-08 DIAGNOSIS — R918 Other nonspecific abnormal finding of lung field: Secondary | ICD-10-CM

## 2021-05-08 DIAGNOSIS — R911 Solitary pulmonary nodule: Secondary | ICD-10-CM | POA: Diagnosis not present

## 2021-05-08 NOTE — Progress Notes (Signed)
Radiation Oncology Follow up Note old patient new area left lung adenocarcinoma  Name: Sabrina Zamora   Date:   05/08/2021 MRN:  [id number] DOB: [birth year]    This 87 y.o. female presents to the clinic today for evaluation of a left lung progressive lesion consistent with adenocarcinoma with lipidic features in patient previously treated to her left breast for DCIS back in June 2022.  REFERRING PROVIDER: No ref. provider found  HPI: Patient is an 87 year old female well-known to our department having completed whole breast radiation to her left breast for DCIS with microinvasion back in June 2022.  For breast endpoint she continues to do well and is without complaint.  We have been following a lesion in her.  Left lower lobe which continues to growth and has solid and subsolid lesions concerning for slow-growing adenocarcinoma.  This was not PET positive previously although adenocarcinoma with lipidic features notoriously do not have FDG avid activity.  She is asymptomatic specifically [place] cough hemoptysis or chest tightness.  COMPLICATIONS OF TREATMENT: none  FOLLOW UP COMPLIANCE: keeps appointments   PHYSICAL EXAM:  BP (!) 174/97   Pulse 84   Temp (!) 96.6 F (35.9 C)   Wt 100 lb (45.4 kg)   BMI 18.00 kg/m  Well-developed well-nourished patient in NAD. HEENT reveals PERLA, EOMI, discs not visualized.  Oral cavity is clear. No oral mucosal lesions are identified. Neck is clear without evidence of cervical or supraclavicular adenopathy. Lungs are clear to A&P. Cardiac examination is essentially unremarkable with regular rate and rhythm without murmur rub or thrill. Abdomen is benign with no organomegaly or masses noted. Motor sensory and DTR levels are equal and symmetric in the upper and lower extremities. Cranial nerves II through XII are grossly intact. Proprioception is intact. No peripheral adenopathy or edema is identified. No motor or sensory levels are noted. Crude visual fields  are within normal range.  RADIOLOGY RESULTS: Serial CT scans and PET CT scans reviewed compatible with above-stated findings  PLAN: Present time this I have offered SBRT to her left lower lobe lesion.  We will plan on delivering 60 Gray in 5 fractions.  I have ordered CT scan with motion restriction and 4-dimensional treatment planning.  Risks and benefits of treatment including low side effect profile possibility developing a cough and some architectural distortion of her lung all were discussed in detail with the patient and her daughter.  They both comprehend my recommendations well.  I would like to take this opportunity to thank you for allowing me to participate in the care of your patient.Sabrina Filbert, MD

## 2021-05-11 ENCOUNTER — Other Ambulatory Visit: Payer: Self-pay

## 2021-05-11 ENCOUNTER — Encounter: Payer: Self-pay | Admitting: Physician Assistant

## 2021-05-11 ENCOUNTER — Ambulatory Visit (INDEPENDENT_AMBULATORY_CARE_PROVIDER_SITE_OTHER): Payer: Medicare Other | Admitting: Physician Assistant

## 2021-05-11 DIAGNOSIS — R918 Other nonspecific abnormal finding of lung field: Secondary | ICD-10-CM | POA: Diagnosis not present

## 2021-05-11 DIAGNOSIS — E039 Hypothyroidism, unspecified: Secondary | ICD-10-CM | POA: Diagnosis not present

## 2021-05-11 DIAGNOSIS — Z0001 Encounter for general adult medical examination with abnormal findings: Secondary | ICD-10-CM

## 2021-05-11 DIAGNOSIS — I10 Essential (primary) hypertension: Secondary | ICD-10-CM | POA: Diagnosis not present

## 2021-05-11 DIAGNOSIS — E782 Mixed hyperlipidemia: Secondary | ICD-10-CM | POA: Diagnosis not present

## 2021-05-11 DIAGNOSIS — Z23 Encounter for immunization: Secondary | ICD-10-CM

## 2021-05-11 DIAGNOSIS — G4733 Obstructive sleep apnea (adult) (pediatric): Secondary | ICD-10-CM | POA: Diagnosis not present

## 2021-05-11 DIAGNOSIS — D0512 Intraductal carcinoma in situ of left breast: Secondary | ICD-10-CM | POA: Diagnosis not present

## 2021-05-11 MED ORDER — MUPIROCIN 2 % EX OINT: 1.0000 "application " | TOPICAL_OINTMENT | Freq: Every day | CUTANEOUS | 0 refills | Status: DC

## 2021-05-11 MED ORDER — MUPIROCIN CALCIUM 2 % NA OINT: 1.0000 "application " | TOPICAL_OINTMENT | Freq: Every day | NASAL | 1 refills | Status: AC

## 2021-05-11 NOTE — Progress Notes (Signed)
[facility] [place], [place] [address]  Internal MEDICINE  Office Visit Note  Patient Name: Sabrina Zamora  [id number]  [id number]  Date of Service: 05/11/2021  Chief Complaint  Patient presents with   Medicare Wellness   Hyperlipidemia   Hypertension     HPI Pt is here for routine health maintenance examination -She is s/p lumpectomy for DCIS left breast. States she has recovered well and was told they would repeat a mammogram at 6 months. She is followed by GS and oncology. -She has also been followed for lung nodule that has had steady growth and that is suspicious for adenocarcinoma and states they decided to proceed with radiation treatment starting soon. -Appetite has been pretty good. Weight is low, but eating well. -Sleeping pretty well, gets up to restroom a few times per night. Gets 6-7 hours of sleep a night. She wears her cpap nightly. -Followed by urology and they just checked her urine last week therefore did not give a sample today for that reason. -She is not having any problems right now and is in good spirits -She does want to check her cholesterol and discussed checking thyroid as well, other labs done routinely by outside providers. Would like a copy of results sent to her once they come in. -requests flu shot today  Current Medication: Outpatient Encounter Medications as of 05/11/2021  Medication Sig   amLODipine (NORVASC) 2.5 MG tablet Take 1 tablet (2.5 mg total) by mouth daily.   aspirin EC 81 MG tablet Take 81 mg by mouth 3 (three) times a week.   Cholecalciferol (VITAMIN D) 50 MCG (2000 UT) CAPS Take 2,000 Units by mouth daily.   denosumab (PROLIA) 60 MG/ML SOSY injection Inject 60 mg into the skin every 6 (six) months.   fluticasone-salmeterol (ADVAIR) 250-50 MCG/ACT AEPB INHALE 1 PUFF INTO THE LUNGS TWICE DAILY   levothyroxine (SYNTHROID) 25 MCG tablet Take 1.5 tab po Daily before breakfast   lovastatin (MEVACOR) 20 MG  tablet TAKE 1 TABLET BY MOUTH NIGHTLY   Multiple Vitamin (MULTI-VITAMINS) TABS Take 1 tablet by mouth daily.   tamoxifen (NOLVADEX) 20 MG tablet Take 1 tablet (20 mg total) by mouth daily.   Zinc 25 MG TABS Take 25 mg by mouth daily.   [DISCONTINUED] mupirocin ointment (BACTROBAN) 2 % Apply 1 application topically daily.   mupirocin nasal ointment (BACTROBAN) 2 % Place 1 application into the nose at bedtime.   [DISCONTINUED] amLODipine (NORVASC) 2.5 MG tablet Take 1 to 2 tab po daily for blood pressure (Patient taking differently: Take 2.5 mg by mouth daily.)   [DISCONTINUED] Cholecalciferol (VITAMIN D PO) Take by mouth daily.    [DISCONTINUED] Fluticasone-Salmeterol (ADVAIR) 250-50 MCG/DOSE AEPB Inhale 1 puff into the lungs 2 (two) times daily. (Patient taking differently: Inhale 1 puff into the lungs at bedtime.)   [DISCONTINUED] lovastatin (MEVACOR) 20 MG tablet Take 1 tablet (20 mg total) by mouth at bedtime.   [DISCONTINUED] mupirocin nasal ointment (BACTROBAN) 2 % Place 1 application into the nose at bedtime.   [DISCONTINUED] Probiotic Product (PROBIOTIC DAILY PO) Take 1 tablet by mouth daily.   No facility-administered encounter medications on file as of 05/11/2021.    Surgical History: Past Surgical History:  Procedure Laterality Date   BREAST BIOPSY Left 2003   neg   BREAST BIOPSY Right 09/27/2017   12:00 1 cmfn fibroadenomatous change    BREAST BIOPSY Right 09/17/2017   12:00 3 cmfn benign breast and fibroadipose tissue  BREAST BIOPSY Left 10/14/2020   stereo biopsy, ribbon clip/ INTERMEDIATE GRADE DUCTAL CARCINOMA IN SITU    BREAST EXCISIONAL BIOPSY Left 1988   excisional - neg   BREAST LUMPECTOMY Left 11/25/2020   BREAST LUMPECTOMY WITH RADIOFREQUENCY TAG IDENTIFICATION Left 86/57/8469   Procedure: BREAST LUMPECTOMY WITH RADIOFREQUENCY TAG IDENTIFICATION;  Surgeon: Jules Husbands, MD;  Location: [facility];  Service: General;  Laterality: Left;   CATARACT EXTRACTION W/PHACO  Left 09/20/2017   Procedure: CATARACT EXTRACTION PHACO AND INTRAOCULAR LENS PLACEMENT ([place]) LEFT;  Surgeon: Leandrew Koyanagi, MD;  Location: [facility];  Service: Ophthalmology;  Laterality: Left;   CATARACT EXTRACTION W/PHACO Right 02/15/2018   Procedure: CATARACT EXTRACTION PHACO AND INTRAOCULAR LENS PLACEMENT ([place]) TOPICAL  RIGHT;  Surgeon: Leandrew Koyanagi, MD;  Location: [facility];  Service: Ophthalmology;  Laterality: Right;  prefers early   CHOLECYSTECTOMY     EYE SURGERY      Medical History: Past Medical History:  Diagnosis Date   Asthma    GERD (gastroesophageal reflux disease)    High cholesterol    Hypertension    Hypothyroidism    Melanoma ([place]) 1986, 2008, 2016, 2021   melenoma left arm, left leg, back, back respectively   Osteoporosis    Polycythemia vera ([place])    Skin cancer 2012, 2016   Sleep apnea     Family History: Family History  Problem Relation Age of Onset   Clotting disorder Sister    Breast cancer Sister 52   Heart disease Mother    Heart disease Father       Review of Systems  Constitutional:  Negative for chills, diaphoresis and fatigue.  HENT:  Negative for ear pain, postnasal drip and sinus pressure.   Eyes:  Negative for photophobia, discharge, redness, itching and visual disturbance.  Respiratory:  Negative for cough, shortness of breath and wheezing.   Cardiovascular:  Negative for chest pain, palpitations and leg swelling.  Gastrointestinal:  Negative for abdominal pain, constipation, diarrhea, nausea and vomiting.  Genitourinary:  Negative for dysuria and flank pain.  Musculoskeletal:  Negative for arthralgias, back pain, gait problem and neck pain.  Skin:  Negative for color change.  Allergic/Immunologic: Negative for environmental allergies and food allergies.  Neurological:  Negative for dizziness and headaches.  Hematological:  Does not bruise/bleed easily.  Psychiatric/Behavioral:  Negative for  agitation, behavioral problems (depression), hallucinations and sleep disturbance.     Vital Signs: BP 140/82   Pulse 80   Temp 98 F (36.7 C)   Resp 16   Ht 5' 2.5" (1.588 m)   Wt 100 lb (45.4 kg)   SpO2 98%   BMI 18.00 kg/m    Physical Exam Vitals and nursing note reviewed.  Constitutional:      General: She is not in acute distress.    Appearance: Normal appearance. She is well-developed. She is not diaphoretic.  HENT:     Head: Normocephalic and atraumatic.     Right Ear: External ear normal.     Left Ear: External ear normal.     Nose: Nose normal.     Mouth/Throat:     Pharynx: No oropharyngeal exudate.  Eyes:     General: No scleral icterus.       Right eye: No discharge.        Left eye: No discharge.     Extraocular Movements: Extraocular movements intact.     Conjunctiva/sclera: Conjunctivae normal.     Pupils: Pupils are equal, round, and  reactive to light.  Neck:     Thyroid: No thyromegaly.     Vascular: No JVD.     Trachea: No tracheal deviation.  Cardiovascular:     Rate and Rhythm: Normal rate and regular rhythm.     Pulses: Normal pulses.     Heart sounds: Normal heart sounds. No murmur heard.   No friction rub. No gallop.  Pulmonary:     Effort: Pulmonary effort is normal. No respiratory distress.     Breath sounds: Normal breath sounds. No stridor. No wheezing or rales.  Chest:     Chest wall: No tenderness.  Abdominal:     General: Abdomen is flat. Bowel sounds are normal. There is no distension.     Palpations: Abdomen is soft. There is no mass.     Tenderness: There is no abdominal tenderness. There is no guarding or rebound.  Musculoskeletal:        General: No tenderness or deformity. Normal range of motion.     Cervical back: Normal range of motion and neck supple.  Lymphadenopathy:     Cervical: No cervical adenopathy.  Skin:    General: Skin is warm and dry.     Coloration: Skin is not pale.     Findings: No erythema or rash.   Neurological:     General: No focal deficit present.     Mental Status: She is alert and oriented to person, place, and time. Mental status is at baseline.     Cranial Nerves: No cranial nerve deficit.     Motor: No abnormal muscle tone.     Coordination: Coordination normal.     Deep Tendon Reflexes: Reflexes are normal and symmetric.  Psychiatric:        Mood and Affect: Mood normal.        Behavior: Behavior normal.        Thought Content: Thought content normal.        Judgment: Judgment normal.     LABS: Recent Results (from the past 2160 hour(s))  Comprehensive metabolic panel     Status: Abnormal   Collection Time: 03/02/21  1:47 PM  Result Value Ref Range   Sodium 137 135 - 145 mmol/L   Potassium 4.2 3.5 - 5.1 mmol/L   Chloride 102 98 - 111 mmol/L   CO2 29 22 - 32 mmol/L   Glucose, Bld 116 (H) 70 - 99 mg/dL    Comment: Glucose reference range applies only to samples taken after fasting for at least 8 hours.   BUN 21 8 - 23 mg/dL   Creatinine, Ser 0.69 0.44 - 1.00 mg/dL   Calcium 9.0 8.9 - 10.3 mg/dL   Total Protein 6.9 6.5 - 8.1 g/dL   Albumin 3.7 3.5 - 5.0 g/dL   AST 28 15 - 41 U/L   ALT 22 0 - 44 U/L   Alkaline Phosphatase 47 38 - 126 U/L   Total Bilirubin 0.7 0.3 - 1.2 mg/dL   GFR, Estimated >60 >60 mL/min    Comment: (NOTE) Calculated using the CKD-EPI Creatinine Equation (2021)    Anion gap 6 5 - 15    Comment: Performed at [facility], [address] [place], [place] [address]  CBC with Differential/Platelet     Status: Abnormal   Collection Time: 03/02/21  1:47 PM  Result Value Ref Range   WBC 6.0 4.0 - 10.5 K/uL   RBC 4.95 3.87 - 5.11 MIL/uL   Hemoglobin 15.8 (H) 12.0 -  15.0 g/dL   HCT 47.3 (H) 36.0 - 46.0 %   MCV 95.6 80.0 - 100.0 fL   MCH 31.9 26.0 - 34.0 pg   MCHC 33.4 30.0 - 36.0 g/dL   RDW 13.3 11.5 - 15.5 %   Platelets 187 150 - 400 K/uL   nRBC 0.0 0.0 - 0.2 %   Neutrophils Relative % 61 %   Neutro Abs 3.7 1.7 - 7.7 K/uL    Lymphocytes Relative 23 %   Lymphs Abs 1.4 0.7 - 4.0 K/uL   Monocytes Relative 13 %   Monocytes Absolute 0.8 0.1 - 1.0 K/uL   Eosinophils Relative 1 %   Eosinophils Absolute 0.1 0.0 - 0.5 K/uL   Basophils Relative 1 %   Basophils Absolute 0.0 0.0 - 0.1 K/uL   Immature Granulocytes 1 %   Abs Immature Granulocytes 0.03 0.00 - 0.07 K/uL    Comment: Performed at [facility], [address] [place], [place] [address]  Hemoglobin and hematocrit, blood     Status: Abnormal   Collection Time: 04/15/21  1:00 PM  Result Value Ref Range   Hemoglobin 15.8 (H) 12.0 - 15.0 g/dL   HCT 48.3 (H) 36.0 - 46.0 %    Comment: Performed at [facility], [address] [place], [place] [address]  Urinalysis, Complete w Microscopic     Status: Abnormal   Collection Time: 04/27/21 12:39 PM  Result Value Ref Range   Color, Urine YELLOW YELLOW   APPearance CLEAR CLEAR   Specific Gravity, Urine 1.010 1.005 - 1.030   pH 6.5 5.0 - 8.0   Glucose, UA NEGATIVE NEGATIVE mg/dL   Hgb urine dipstick TRACE (A) NEGATIVE   Bilirubin Urine NEGATIVE NEGATIVE   Ketones, ur NEGATIVE NEGATIVE mg/dL   Protein, ur NEGATIVE NEGATIVE mg/dL   Nitrite NEGATIVE NEGATIVE   Leukocytes,Ua SMALL (A) NEGATIVE   Squamous Epithelial / LPF 0-5 0 - 5   WBC, UA 6-10 0 - 5 WBC/hpf   RBC / HPF 0-5 0 - 5 RBC/hpf   Bacteria, UA RARE (A) NONE SEEN    Comment: Performed at [facility] [place], [address] [place], [place] [address]  Urine Culture     Status: Abnormal   Collection Time: 04/27/21 12:39 PM   Specimen: Urine, Random  Result Value Ref Range   Specimen Description      URINE, RANDOM Performed at [facility], [address], [place], [place] [address]    Special Requests      NONE Performed at [facility], [address] [place],  [address]    Culture MULTIPLE SPECIES PRESENT, SUGGEST RECOLLECTION (A)    Report Status 04/28/2021 FINAL   BLADDER SCAN AMB  NON-IMAGING     Status: None   Collection Time: 04/27/21  1:03 PM  Result Value Ref Range   Scan Result 154ml   Urinalysis, Complete     Status: Abnormal   Collection Time: 05/01/21  9:45 AM  Result Value Ref Range   Specific Gravity, UA 1.015 1.005 - 1.030   pH, UA 7.0 5.0 - 7.5   Color, UA Yellow Yellow   Appearance Ur Cloudy (A) Clear   Leukocytes,UA 2+ (A) Negative   Protein,UA Negative Negative/Trace   Glucose, UA Negative Negative   Ketones, UA Trace (A) Negative   RBC, UA Negative Negative   Bilirubin, UA Negative Negative   Urobilinogen, Ur 0.2 0.2 - 1.0 mg/dL   Nitrite, UA  Negative Negative   Microscopic Examination See below:   CULTURE, URINE COMPREHENSIVE     Status: None   Collection Time: 05/01/21  9:45 AM   Specimen: Urine   UR  Result Value Ref Range   Urine Culture, Comprehensive Final report    Organism ID, Bacteria Comment     Comment: Mixed urogenital flora 50,000-100,000 colony forming units per mL   Microscopic Examination     Status: Abnormal   Collection Time: 05/01/21  9:45 AM   Urine  Result Value Ref Range   WBC, UA 11-30 (A) 0 - 5 /hpf   RBC 0-2 0 - 2 /hpf   Epithelial Cells (non renal) >10 (A) 0 - 10 /hpf   Renal Epithel, UA 0-10 (A) None seen /hpf   Casts Present (A) None seen /lpf   Cast Type Hyaline casts N/A   Bacteria, UA Few None seen/Few  I-STAT creatinine     Status: None   Collection Time: 05/04/21 10:02 AM  Result Value Ref Range   Creatinine, Ser 0.80 0.44 - 1.00 mg/dL        Assessment/Plan: 1. Encounter for general adult medical examination with abnormal findings CPE performed, labs ordered  2. Multiple lung nodules on CT Followed by radiation oncology and heme/onc  3. Ductal carcinoma in situ (DCIS) of left breast S/p lumpectomy, followed by GS and oncology  4. Essential hypertension Stable, continue current medication  5. Acquired hypothyroidism Continue synthroid and will update labs and adjust as  indicated - TSH + free T4  6. Mixed hyperlipidemia Continue lovastatin and check labs - Lipid Panel With LDL/HDL Ratio  7. Obstructive sleep apnea Continue cpap nightly  8. Flu vaccine need - Flu Vaccine MDCK QUAD PF   General Counseling: Sabrina Zamora verbalizes understanding of the findings of todays visit and agrees with plan of treatment. I have discussed any further diagnostic evaluation that may be needed or ordered today. We also reviewed her medications today. she has been encouraged to call the office with any questions or concerns that should arise related to todays visit.    Counseling:    Orders Placed This Encounter  Procedures   Flu Vaccine MDCK QUAD PF   TSH + free T4   Lipid Panel With LDL/HDL Ratio    Meds ordered this encounter  Medications   DISCONTD: mupirocin ointment (BACTROBAN) 2 %    Sig: Apply 1 application topically daily.    Dispense:  22 g    Refill:  0   mupirocin nasal ointment (BACTROBAN) 2 %    Sig: Place 1 application into the nose at bedtime.    Dispense:  10 g    Refill:  1    This patient was seen by Drema Dallas, PA-C in collaboration with Dr. Clayborn Bigness as a part of collaborative care agreement.  Total time spent:35 Minutes  Time spent includes review of chart, medications, test results, and follow up plan with the patient.     Lavera Guise, MD  Internal Medicine

## 2021-05-12 ENCOUNTER — Telehealth: Payer: Self-pay | Admitting: Oncology

## 2021-05-12 NOTE — Telephone Encounter (Signed)
Pt called to reschedule her appt. Wants to see if she can get it earlier.Please call her at [phone]

## 2021-05-12 NOTE — Telephone Encounter (Signed)
Patient is going out of town the week of 11/22.  Verified with RN that it is ok for her to come the week of 11/17.  Appointment rescheduled, and patient notified!

## 2021-05-14 DIAGNOSIS — R5383 Other fatigue: Secondary | ICD-10-CM | POA: Diagnosis not present

## 2021-05-14 DIAGNOSIS — E782 Mixed hyperlipidemia: Secondary | ICD-10-CM | POA: Diagnosis not present

## 2021-05-15 LAB — LIPID PANEL WITH LDL/HDL RATIO
Cholesterol, Total: 165 mg/dL (ref 100–199)
HDL: 67 mg/dL (ref >39)
LDL Chol Calc (NIH): 84 mg/dL (ref 0–99)
LDL/HDL Ratio: 1.3 ratio (ref 0.0–3.2)
Triglycerides: 76 mg/dL (ref 0–149)
VLDL Cholesterol Cal: 14 mg/dL (ref 5–40)

## 2021-05-15 LAB — TSH+FREE T4
Free T4: 1.47 ng/dL (ref 0.82–1.77)
TSH: 4.29 u[IU]/mL (ref 0.450–4.500)

## 2021-05-21 ENCOUNTER — Ambulatory Visit: Payer: Medicare Other

## 2021-05-25 ENCOUNTER — Ambulatory Visit
Admission: RE | Admit: 2021-05-25 | Discharge: 2021-05-25 | Disposition: A | Discharge status: Home / Self Care | Payer: Medicare Other | Source: Ambulatory Visit | Attending: Radiation Oncology | Admitting: Radiation Oncology

## 2021-05-25 DIAGNOSIS — D0512 Intraductal carcinoma in situ of left breast: Secondary | ICD-10-CM | POA: Insufficient documentation

## 2021-05-25 DIAGNOSIS — R911 Solitary pulmonary nodule: Secondary | ICD-10-CM | POA: Diagnosis not present

## 2021-05-25 DIAGNOSIS — Z510 Encounter for antineoplastic radiation therapy: Secondary | ICD-10-CM | POA: Insufficient documentation

## 2021-05-26 DIAGNOSIS — D0512 Intraductal carcinoma in situ of left breast: Secondary | ICD-10-CM | POA: Diagnosis not present

## 2021-05-26 DIAGNOSIS — R911 Solitary pulmonary nodule: Secondary | ICD-10-CM | POA: Diagnosis not present

## 2021-05-26 DIAGNOSIS — Z510 Encounter for antineoplastic radiation therapy: Secondary | ICD-10-CM | POA: Diagnosis not present

## 2021-05-28 ENCOUNTER — Encounter: Payer: Self-pay | Admitting: Nurse Practitioner

## 2021-05-28 ENCOUNTER — Inpatient Hospital Stay (HOSPITAL_BASED_OUTPATIENT_CLINIC_OR_DEPARTMENT_OTHER): Payer: Medicare Other | Admitting: Nurse Practitioner

## 2021-05-28 ENCOUNTER — Telehealth: Payer: Self-pay

## 2021-05-28 ENCOUNTER — Other Ambulatory Visit: Payer: Self-pay

## 2021-05-28 ENCOUNTER — Inpatient Hospital Stay: Payer: Medicare Other

## 2021-05-28 ENCOUNTER — Inpatient Hospital Stay: Payer: Medicare Other | Attending: Oncology

## 2021-05-28 VITALS — BP 156/88 | HR 75 | Temp 98.1°F | Resp 16 | Wt 97.9 lb

## 2021-05-28 VITALS — BP 153/87 | HR 71 | Resp 16

## 2021-05-28 DIAGNOSIS — Z923 Personal history of irradiation: Secondary | ICD-10-CM | POA: Insufficient documentation

## 2021-05-28 DIAGNOSIS — D0512 Intraductal carcinoma in situ of left breast: Secondary | ICD-10-CM | POA: Insufficient documentation

## 2021-05-28 DIAGNOSIS — Z79899 Other long term (current) drug therapy: Secondary | ICD-10-CM | POA: Diagnosis not present

## 2021-05-28 DIAGNOSIS — D751 Secondary polycythemia: Secondary | ICD-10-CM

## 2021-05-28 DIAGNOSIS — C50912 Malignant neoplasm of unspecified site of left female breast: Secondary | ICD-10-CM

## 2021-05-28 DIAGNOSIS — Z170 Estrogen receptor positive status [ER+]: Secondary | ICD-10-CM | POA: Insufficient documentation

## 2021-05-28 LAB — CBC WITH DIFFERENTIAL/PLATELET
Abs Immature Granulocytes: 0.03 10*3/uL (ref 0.00–0.07)
Basophils Absolute: 0.1 10*3/uL (ref 0.0–0.1)
Basophils Relative: 1 %
Eosinophils Absolute: 0.1 10*3/uL (ref 0.0–0.5)
Eosinophils Relative: 2 %
HCT: 50.9 % — ABNORMAL HIGH (ref 36.0–46.0)
Hemoglobin: 16.7 g/dL — ABNORMAL HIGH (ref 12.0–15.0)
Immature Granulocytes: 1 %
Lymphocytes Relative: 25 %
Lymphs Abs: 1.4 10*3/uL (ref 0.7–4.0)
MCH: 32.2 pg (ref 26.0–34.0)
MCHC: 32.8 g/dL (ref 30.0–36.0)
MCV: 98.3 fL (ref 80.0–100.0)
Monocytes Absolute: 0.7 10*3/uL (ref 0.1–1.0)
Monocytes Relative: 13 %
Neutro Abs: 3.3 10*3/uL (ref 1.7–7.7)
Neutrophils Relative %: 58 %
Platelets: 178 10*3/uL (ref 150–400)
RBC: 5.18 MIL/uL — ABNORMAL HIGH (ref 3.87–5.11)
RDW: 13.1 % (ref 11.5–15.5)
WBC: 5.5 10*3/uL (ref 4.0–10.5)
nRBC: 0 % (ref 0.0–0.2)

## 2021-05-28 LAB — COMPREHENSIVE METABOLIC PANEL
ALT: 19 U/L (ref 0–44)
AST: 28 U/L (ref 15–41)
Albumin: 4 g/dL (ref 3.5–5.0)
Alkaline Phosphatase: 41 U/L (ref 38–126)
Anion gap: 9 (ref 5–15)
BUN: 17 mg/dL (ref 8–23)
CO2: 31 mmol/L (ref 22–32)
Calcium: 9.6 mg/dL (ref 8.9–10.3)
Chloride: 98 mmol/L (ref 98–111)
Creatinine, Ser: 0.73 mg/dL (ref 0.44–1.00)
GFR, Estimated: >60 mL/min (ref >60)
Glucose, Bld: 112 mg/dL — ABNORMAL HIGH (ref 70–99)
Potassium: 4.7 mmol/L (ref 3.5–5.1)
Sodium: 138 mmol/L (ref 135–145)
Total Bilirubin: 0.6 mg/dL (ref 0.3–1.2)
Total Protein: 7.5 g/dL (ref 6.5–8.1)

## 2021-05-28 NOTE — Progress Notes (Signed)
Pt in for follow up, states has been feeling well.  Denies any concerns today.

## 2021-05-28 NOTE — Patient Instructions (Signed)

## 2021-05-28 NOTE — Telephone Encounter (Signed)
Patient called requesting cholesterol  results. She would like call back. Per patient's request, I will mail her results as well-Toni

## 2021-05-28 NOTE — Progress Notes (Signed)
[facility] day:  05/28/21  Chief Complaint: Sabrina Zamora is a 87 y.o. female with a history of secondary polycythemia presents for follow up .   HPI:  The patient was last seen in the medical oncology clinic on 02/04/2017.  At that time, she felt good.  Exam was unremarkable.  Hematocrit was 46.1 and hemoglobin 15.6.  She had mot required a phlebotomy since 04/2015.  Follow-up appointments were spread out.  CBC was to be checked every 6 months.  Patient previously followed up with Dr. Mike Gip.  Patient switched care to Dr Tasia Catchings on 11/29/2018.  #Secondary polycythemia Patient has a history of sleep apnea she wears oxygen at night but no CPAP mask.  Never smoker. Work-up on 01/11/2012 reviewed and negative Jak 2 V617F mutation, exon 12 mutation. 08/25/2018 CALR negative, JAK 2 E12-15 negative, MPL negative.  Patient has been on phlebotomy program to keep hematocrit less than 48 and then later to keep it less than 50.  #History of melanoma x3.  T1 a melanoma in August 2012. saw Dermatology and had topical chemotherapy treatment to her forehead lesion.   #She has been compliant with CPAP machine. She did nocturnal oxygen testing and she is not qualified for nocturnal oxygen.   # Lung nodule  She is a never smoker.  Report second hand smoke exposure when she grew up and during the initial few years of her marriage.    Her husband stopped smoking in 1960s.   # 06/26/2020, CT without contrast showed a partial solid nodule within the superior segment of the left lower pole.  This measures 2.4 cm with a solid component measuring 5 mm.  Bronchiectasis with tree-in-bud nodularity is noted within the anterior and posterior basal portions of the right upper lobe.  10/06/2020 CT chest wo contrast - mixed solid and ground-glass nodule of  the anterior superior segment left lower lobe, with tenting of the adjacent fissural pleura. The overall dimensions are  approximately 2.8 x 2.4 cm, previously 2.6 x 2.3 cm when measured similarly, with nodular components measuring approximately 0.5 cm and 0.7 cm. Central nodular components appear somewhat more solid than on comparison prior although are not substantially changed in size a new 5 mm pulmonary nodule of the lateral segment right middle lobe, nonspecific.Stable 4 mm nodule of the anterior right lower lobe, which remains nonspecific.4. There are numerous additional unchanged clustered and tree-in-bud centrilobular nodules, particularly in the posterior right upper lobe. 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  Patient's case was discussed on tumor board on 10/15/2020.  Consensus reached about proceed with biopsy versus PET scan+ empiric SBRT given her age. 10/28/2020, PET scan did not show activity of the left lower lobe nodule.  However her tumor board discussion, CT appearance is indicated for of lung cancer.  Patient was referred to establish care with Dr. Baruch Gouty radiation oncology and was evaluated on 11/03/2020.  Dr. Cheron Schaumann recommend watchful waiting for the time being.  Should the nodule continues to solidify and grow patient will be offered empirically SBRT at that time.  Left breast DCIS with microinvasive component 09/25/20 screening mammogram showed possible distortion and calcifications in the left breast.  10/08/2020 left diagnostic mammogram showed Grouped microcalcifications in the left breast, Four circumscribed hypoechoic masses in the left breast may represent complicated cysts and are probably benign.  10/14/2020 left breast upper outer quadrant at middle depth-- intermediate grade DCIS, 1mm, with microcalcifications.  11/25/2020, patient  underwent left lumpectomy by Dr. Dahlia Byes There is microinvasive mammary carcinoma with high-grade DCIS.  Negative margins.  pTmi pNx Given her advanced age, multiple other medical conditions, strongly ER positivity,  informed decision was made to omit sentinel lymph node biopsy. Case was discussed on breast cancer tumor board.  01/14/2021 finished radiation.   INTERVAL HISTORY GLYNDA SOLIDAY is a 87 y.o. female with above history who returnst o clinic for labs, follow up, and possible phlebotomy and continued surveillance of lung nodule and left breast dcis. She continues tamoxifen. Tolerating well. Compliant with cpap. Continues aspirin 81 mg daily. She questions getting her covid booster shot. Is concerned of side effects. Has had other vaccines and boosters. Had flu shot this year.   Review of Systems  Constitutional:  Negative for appetite change, chills, fatigue and fever.  HENT:   Negative for hearing loss and voice change.   Eyes:  Negative for eye problems.  Respiratory:  Negative for chest tightness and cough.   Cardiovascular:  Negative for chest pain.  Gastrointestinal:  Negative for abdominal distention, abdominal pain and blood in stool.  Endocrine: Negative for hot flashes.  Genitourinary:  Negative for difficulty urinating and frequency.   Musculoskeletal:  Negative for arthralgias.  Skin:  Negative for itching and rash.  Neurological:  Negative for extremity weakness.  Hematological:  Negative for adenopathy.  Psychiatric/Behavioral:  Negative for confusion.    Past Medical History:  Diagnosis Date   Asthma    GERD (gastroesophageal reflux disease)    High cholesterol    Hypertension    Hypothyroidism    Melanoma ([place]) 1986, 2008, 2016, 2021   melenoma left arm, left leg, back, back respectively   Osteoporosis    Polycythemia vera ([place])    Skin cancer 2012, 2016   Sleep apnea     Past Surgical History:  Procedure Laterality Date   BREAST BIOPSY Left 2003   neg   BREAST BIOPSY Right 09/27/2017   12:00 1 cmfn fibroadenomatous change    BREAST BIOPSY Right 09/17/2017   12:00 3 cmfn benign breast and fibroadipose tissue   BREAST BIOPSY Left 10/14/2020   stereo biopsy,  ribbon clip/ INTERMEDIATE GRADE DUCTAL CARCINOMA IN SITU    BREAST EXCISIONAL BIOPSY Left 1988   excisional - neg   BREAST LUMPECTOMY Left 11/25/2020   BREAST LUMPECTOMY WITH RADIOFREQUENCY TAG IDENTIFICATION Left 87/56/4332   Procedure: BREAST LUMPECTOMY WITH RADIOFREQUENCY TAG IDENTIFICATION;  Surgeon: Jules Husbands, MD;  Location: [facility];  Service: General;  Laterality: Left;   CATARACT EXTRACTION W/PHACO Left 09/20/2017   Procedure: CATARACT EXTRACTION PHACO AND INTRAOCULAR LENS PLACEMENT ([place]) LEFT;  Surgeon: Leandrew Koyanagi, MD;  Location: [place];  Service: Ophthalmology;  Laterality: Left;   CATARACT EXTRACTION W/PHACO Right 02/15/2018   Procedure: CATARACT EXTRACTION PHACO AND INTRAOCULAR LENS PLACEMENT ([place]) TOPICAL  RIGHT;  Surgeon: Leandrew Koyanagi, MD;  Location: [place];  Service: Ophthalmology;  Laterality: Right;  prefers early   CHOLECYSTECTOMY     EYE SURGERY      Family History  Problem Relation Age of Onset   Clotting disorder Sister    Breast cancer Sister 60   Heart disease Mother    Heart disease Father     Social History:  reports that she has never smoked. She has never used smokeless tobacco. She reports that she does not drink alcohol and does not use drugs.  She has never smoked.    Allergies:  Allergies  Allergen Reactions  Levofloxacin Rash and Shortness Of Breath   Penicillins Hives and Rash   Sulfa Antibiotics Rash   Tobramycin Other (See Comments)    Unknown   Betadine [Povidone Iodine] Rash   Ciprofloxacin Itching and Rash    Current Medications: Current Outpatient Medications  Medication Sig Dispense Refill   amLODipine (NORVASC) 2.5 MG tablet Take 1 tablet (2.5 mg total) by mouth daily. 45 tablet 3   aspirin EC 81 MG tablet Take 81 mg by mouth 3 (three) times a week.     Cholecalciferol (VITAMIN D) 50 MCG (2000 UT) CAPS Take 2,000 Units by mouth daily.     denosumab (PROLIA) 60 MG/ML SOSY injection  Inject 60 mg into the skin every 6 (six) months.     fluticasone-salmeterol (ADVAIR) 250-50 MCG/ACT AEPB INHALE 1 PUFF INTO THE LUNGS TWICE DAILY 60 each 3   levothyroxine (SYNTHROID) 25 MCG tablet Take 1.5 tab po Daily before breakfast 135 tablet 3   lovastatin (MEVACOR) 20 MG tablet TAKE 1 TABLET BY MOUTH NIGHTLY 90 tablet 2   Multiple Vitamin (MULTI-VITAMINS) TABS Take 1 tablet by mouth daily.     mupirocin nasal ointment (BACTROBAN) 2 % Place 1 application into the nose at bedtime. 10 g 1   tamoxifen (NOLVADEX) 20 MG tablet Take 1 tablet (20 mg total) by mouth daily. 30 tablet 6   Zinc 25 MG TABS Take 25 mg by mouth daily.     No current facility-administered medications for this visit.    Physical Exam: Blood pressure (!) 156/88, pulse 75, temperature 98.1 F (36.7 C), temperature source Tympanic, resp. rate 16, weight 97 lb 14.4 oz (44.4 kg), SpO2 98 %.  Physical Exam Constitutional:      General: She is not in acute distress.    Appearance: She is not diaphoretic.     Comments: Thin built, walks independently.   HENT:     Head: Normocephalic and atraumatic.     Nose: Nose normal.     Mouth/Throat:     Pharynx: No oropharyngeal exudate.  Eyes:     General: No scleral icterus.    Pupils: Pupils are equal, round, and reactive to light.  Cardiovascular:     Rate and Rhythm: Normal rate and regular rhythm.     Heart sounds: No murmur heard. Pulmonary:     Effort: Pulmonary effort is normal. No respiratory distress.     Breath sounds: No rales.  Chest:     Chest wall: No tenderness.  Abdominal:     General: There is no distension.     Palpations: Abdomen is soft.     Tenderness: There is no abdominal tenderness.  Musculoskeletal:        General: Normal range of motion.     Cervical back: Normal range of motion and neck supple.  Skin:    General: Skin is warm and dry.     Findings: Erythema present.  Neurological:     Mental Status: She is alert and oriented to person,  place, and time.     Cranial Nerves: No cranial nerve deficit.     Motor: No abnormal muscle tone.     Coordination: Coordination normal.  Psychiatric:        Mood and Affect: Affect normal.  Status post left lumpectomy, mild radiation site erythematous changes.   Appointment on 05/28/2021  Component Date Value Ref Range Status   WBC 05/28/2021 5.5  4.0 - 10.5 K/uL Final   RBC 05/28/2021 5.18 (H)  3.87 - 5.11 MIL/uL Final   Hemoglobin 05/28/2021 16.7 (H)  12.0 - 15.0 g/dL Final   HCT 05/28/2021 50.9 (H)  36.0 - 46.0 % Final   MCV 05/28/2021 98.3  80.0 - 100.0 fL Final   MCH 05/28/2021 32.2  26.0 - 34.0 pg Final   MCHC 05/28/2021 32.8  30.0 - 36.0 g/dL Final   RDW 05/28/2021 13.1  11.5 - 15.5 % Final   Platelets 05/28/2021 178  150 - 400 K/uL Final   nRBC 05/28/2021 0.0  0.0 - 0.2 % Final   Neutrophils Relative % 05/28/2021 58  % Final   Neutro Abs 05/28/2021 3.3  1.7 - 7.7 K/uL Final   Lymphocytes Relative 05/28/2021 25  % Final   Lymphs Abs 05/28/2021 1.4  0.7 - 4.0 K/uL Final   Monocytes Relative 05/28/2021 13  % Final   Monocytes Absolute 05/28/2021 0.7  0.1 - 1.0 K/uL Final   Eosinophils Relative 05/28/2021 2  % Final   Eosinophils Absolute 05/28/2021 0.1  0.0 - 0.5 K/uL Final   Basophils Relative 05/28/2021 1  % Final   Basophils Absolute 05/28/2021 0.1  0.0 - 0.1 K/uL Final   Immature Granulocytes 05/28/2021 1  % Final   Abs Immature Granulocytes 05/28/2021 0.03  0.00 - 0.07 K/uL Final   Performed at [facility], [place], [place] [address]   Sodium 05/28/2021 138  135 - 145 mmol/L Final   Potassium 05/28/2021 4.7  3.5 - 5.1 mmol/L Final   Chloride 05/28/2021 98  98 - 111 mmol/L Final   CO2 05/28/2021 31  22 - 32 mmol/L Final   Glucose, Bld 05/28/2021 112 (H)  70 - 99 mg/dL Final   Glucose reference range applies only to samples taken after fasting for at least 8 hours.   BUN 05/28/2021 17  8 - 23 mg/dL Final   Creatinine, Ser 05/28/2021 0.73   0.44 - 1.00 mg/dL Final   Calcium 05/28/2021 9.6  8.9 - 10.3 mg/dL Final   Total Protein 05/28/2021 7.5  6.5 - 8.1 g/dL Final   Albumin 05/28/2021 4.0  3.5 - 5.0 g/dL Final   AST 05/28/2021 28  15 - 41 U/L Final   ALT 05/28/2021 19  0 - 44 U/L Final   Alkaline Phosphatase 05/28/2021 41  38 - 126 U/L Final   Total Bilirubin 05/28/2021 0.6  0.3 - 1.2 mg/dL Final   GFR, Estimated 05/28/2021 >60  >60 mL/min Final   Comment: (NOTE) Calculated using the CKD-EPI Creatinine Equation (2021)    Anion gap 05/28/2021 9  5 - 15 Final   Performed at [facility], [address] [place], [place] [address]    Assessment:  Sabrina Zamora is a 87 y.o. female present for follow up of erythrocytosis.  No diagnosis found.  #Lung nodule found on chest x-ray and confirmed on CT scan, no FDG activity on PET scan. Watchful waiting per radiation oncology. Dr Baruch Gouty repeated ct 05/04/21 with findings concerning for slow growing adenocarcinoma. SBRT was offered.   2. Left breast DCIS with microinvasive component- s/p lumpectomy. No SLNBx was performed. S/p RT, on tamoxifen with plan for 5 years of treatment. Continue aspirin 81 mg for thrombosis prophylaxis. Mammogram in March 2023. She is followed by gynecology for annual exams.   3. History of osteoporosis- hx of foot fracture. On prolia. Managed by pcp.   4. Erythrocytosis- secondary to sleep apnea. Phlebotomy prn for hematocrit > 50. Today, 50.9. Proceed  with phlebotomy.   5. Covid booster- encouraged covid booster. Discussed in detail benefits vs risks.   Follow up plan Follow up as planned to see Dr Tasia Catchings in February 2023.   We spent sufficient time to discuss many aspect of care, questions were answered to patient's satisfaction.  Verlon Au, NP  05/28/2021

## 2021-05-28 NOTE — Telephone Encounter (Signed)
Please review

## 2021-05-28 NOTE — Progress Notes (Signed)
Therapeutic phlebotomy performed; removed 200 ml blood removed from 20 g angiocath R AC. Patient tolerated procedure well. VSS. Discharged to home with no complaints of feeling weak.

## 2021-05-29 NOTE — Telephone Encounter (Signed)
Pt advised by [facility]

## 2021-06-02 ENCOUNTER — Ambulatory Visit: Payer: Medicare Other | Admitting: Nurse Practitioner

## 2021-06-02 ENCOUNTER — Other Ambulatory Visit: Payer: Medicare Other

## 2021-06-09 ENCOUNTER — Ambulatory Visit
Admission: RE | Admit: 2021-06-09 | Discharge: 2021-06-09 | Disposition: A | Discharge status: Home / Self Care | Payer: Medicare Other | Source: Ambulatory Visit | Attending: Radiation Oncology | Admitting: Radiation Oncology

## 2021-06-09 DIAGNOSIS — D0512 Intraductal carcinoma in situ of left breast: Secondary | ICD-10-CM | POA: Diagnosis not present

## 2021-06-09 DIAGNOSIS — Z510 Encounter for antineoplastic radiation therapy: Secondary | ICD-10-CM | POA: Diagnosis not present

## 2021-06-11 ENCOUNTER — Ambulatory Visit
Admission: RE | Admit: 2021-06-11 | Discharge: 2021-06-11 | Disposition: A | Discharge status: Home / Self Care | Payer: Medicare Other | Source: Ambulatory Visit | Attending: Radiation Oncology | Admitting: Radiation Oncology

## 2021-06-11 DIAGNOSIS — C3432 Malignant neoplasm of lower lobe, left bronchus or lung: Secondary | ICD-10-CM | POA: Insufficient documentation

## 2021-06-11 DIAGNOSIS — Z510 Encounter for antineoplastic radiation therapy: Secondary | ICD-10-CM | POA: Diagnosis not present

## 2021-06-16 ENCOUNTER — Ambulatory Visit
Admission: RE | Admit: 2021-06-16 | Discharge: 2021-06-16 | Disposition: A | Discharge status: Home / Self Care | Payer: Medicare Other | Source: Ambulatory Visit | Attending: Radiation Oncology | Admitting: Radiation Oncology

## 2021-06-16 DIAGNOSIS — Z510 Encounter for antineoplastic radiation therapy: Secondary | ICD-10-CM | POA: Diagnosis not present

## 2021-06-16 DIAGNOSIS — C3432 Malignant neoplasm of lower lobe, left bronchus or lung: Secondary | ICD-10-CM | POA: Diagnosis not present

## 2021-06-18 ENCOUNTER — Ambulatory Visit
Admission: RE | Admit: 2021-06-18 | Discharge: 2021-06-18 | Disposition: A | Discharge status: Home / Self Care | Payer: Medicare Other | Source: Ambulatory Visit | Attending: Radiation Oncology | Admitting: Radiation Oncology

## 2021-06-18 DIAGNOSIS — Z510 Encounter for antineoplastic radiation therapy: Secondary | ICD-10-CM | POA: Diagnosis not present

## 2021-06-18 DIAGNOSIS — C3432 Malignant neoplasm of lower lobe, left bronchus or lung: Secondary | ICD-10-CM | POA: Diagnosis not present

## 2021-06-23 ENCOUNTER — Ambulatory Visit
Admission: RE | Admit: 2021-06-23 | Discharge: 2021-06-23 | Disposition: A | Discharge status: Home / Self Care | Payer: Medicare Other | Source: Ambulatory Visit | Attending: Radiation Oncology | Admitting: Radiation Oncology

## 2021-06-23 DIAGNOSIS — R911 Solitary pulmonary nodule: Secondary | ICD-10-CM | POA: Diagnosis not present

## 2021-06-23 DIAGNOSIS — D0512 Intraductal carcinoma in situ of left breast: Secondary | ICD-10-CM | POA: Diagnosis not present

## 2021-06-23 DIAGNOSIS — C3432 Malignant neoplasm of lower lobe, left bronchus or lung: Secondary | ICD-10-CM | POA: Diagnosis not present

## 2021-06-23 DIAGNOSIS — Z510 Encounter for antineoplastic radiation therapy: Secondary | ICD-10-CM | POA: Diagnosis not present

## 2021-06-29 DIAGNOSIS — M810 Age-related osteoporosis without current pathological fracture: Secondary | ICD-10-CM | POA: Diagnosis not present

## 2021-07-01 ENCOUNTER — Other Ambulatory Visit: Payer: Self-pay | Admitting: Nurse Practitioner

## 2021-07-01 ENCOUNTER — Other Ambulatory Visit: Payer: Self-pay | Admitting: Internal Medicine

## 2021-07-27 ENCOUNTER — Telehealth: Payer: Self-pay | Admitting: Urology

## 2021-07-27 NOTE — Telephone Encounter (Signed)
Sabrina Zamora had an episode of gross hematuria for which I recommended a CT urogram for further workup, but she did not want to undergo a CT urogram at that time as she had just had a Chest CT.  Is she ready to schedule the CT urogram at this time?  It is important to follow up with this CT as blood in the urine can be the only symptom of a kidney or bladder cancer.

## 2021-07-28 NOTE — Telephone Encounter (Signed)
Spoke with patient and she's dealing with breast cancer and lung cancer and declined CT again.

## 2021-08-03 ENCOUNTER — Ambulatory Visit
Admission: RE | Admit: 2021-08-03 | Discharge: 2021-08-03 | Disposition: A | Discharge status: Home / Self Care | Payer: Medicare Other | Source: Ambulatory Visit | Attending: Radiation Oncology | Admitting: Radiation Oncology

## 2021-08-03 ENCOUNTER — Encounter: Payer: Self-pay | Admitting: Radiation Oncology

## 2021-08-03 ENCOUNTER — Other Ambulatory Visit: Payer: Self-pay

## 2021-08-03 ENCOUNTER — Other Ambulatory Visit: Payer: Self-pay | Admitting: *Deleted

## 2021-08-03 VITALS — BP 171/95 | Temp 97.0°F | Resp 17 | Wt 97.1 lb

## 2021-08-03 DIAGNOSIS — R918 Other nonspecific abnormal finding of lung field: Secondary | ICD-10-CM

## 2021-08-03 DIAGNOSIS — C3432 Malignant neoplasm of lower lobe, left bronchus or lung: Secondary | ICD-10-CM | POA: Insufficient documentation

## 2021-08-03 NOTE — Progress Notes (Signed)
Radiation Oncology Follow up Note  Name: Sabrina Zamora   Date:   08/03/2021 MRN:  [id number] DOB: [birth year]    This 88 y.o. female presents to the clinic today for 1 month follow-up status post SBRT to the left lower lobe consistent with stage I non-small cell lung cancer in patient previously treated for DCIS of the breast back in 2022.  REFERRING PROVIDER: Mylinda Latina, PA*  HPI: Patient is a 88 year old female now out 1 month having completed SBRT to her left lower lobe for presumed stage I non-small cell lung cancer seen today in routine follow-up she is doing well specifically denies hemoptysis chest tightness or dysphagia she does have a chronic cough which predates her treatment..  COMPLICATIONS OF TREATMENT: none  FOLLOW UP COMPLIANCE: keeps appointments   PHYSICAL EXAM:  BP (!) 171/95    Temp (!) 97 F (36.1 C)    Resp 17    Wt 97 lb 1.6 oz (44 kg)    BMI 17.48 kg/m  Well-developed well-nourished patient in NAD. HEENT reveals PERLA, EOMI, discs not visualized.  Oral cavity is clear. No oral mucosal lesions are identified. Neck is clear without evidence of cervical or supraclavicular adenopathy. Lungs are clear to A&P. Cardiac examination is essentially unremarkable with regular rate and rhythm without murmur rub or thrill. Abdomen is benign with no organomegaly or masses noted. Motor sensory and DTR levels are equal and symmetric in the upper and lower extremities. Cranial nerves II through XII are grossly intact. Proprioception is intact. No peripheral adenopathy or edema is identified. No motor or sensory levels are noted. Crude visual fields are within normal range.  RADIOLOGY RESULTS: CT scan ordered fourth in 3 months prior to our next follow-up visit  PLAN: Present time she is doing extremely well with very low side effect profile from SBRT.  On pleased with her overall progress.  I have asked to see her back in 3 months with a CT scan of her chest prior to that  visit.  Patient knows to call with any concerns.  I would like to take this opportunity to thank you for allowing me to participate in the care of your patient.Noreene Filbert, MD

## 2021-09-01 DIAGNOSIS — Z20822 Contact with and (suspected) exposure to covid-19: Secondary | ICD-10-CM | POA: Diagnosis not present

## 2021-09-02 ENCOUNTER — Inpatient Hospital Stay (HOSPITAL_BASED_OUTPATIENT_CLINIC_OR_DEPARTMENT_OTHER): Payer: Medicare Other | Admitting: Oncology

## 2021-09-02 ENCOUNTER — Other Ambulatory Visit: Payer: Self-pay

## 2021-09-02 ENCOUNTER — Encounter: Payer: Self-pay | Admitting: Oncology

## 2021-09-02 ENCOUNTER — Inpatient Hospital Stay: Payer: Medicare Other

## 2021-09-02 ENCOUNTER — Inpatient Hospital Stay: Payer: Medicare Other | Attending: Oncology

## 2021-09-02 VITALS — BP 165/93 | HR 88 | Temp 97.3°F | Wt 95.4 lb

## 2021-09-02 DIAGNOSIS — C50912 Malignant neoplasm of unspecified site of left female breast: Secondary | ICD-10-CM | POA: Diagnosis not present

## 2021-09-02 DIAGNOSIS — Z79899 Other long term (current) drug therapy: Secondary | ICD-10-CM | POA: Diagnosis not present

## 2021-09-02 DIAGNOSIS — D0512 Intraductal carcinoma in situ of left breast: Secondary | ICD-10-CM | POA: Diagnosis not present

## 2021-09-02 DIAGNOSIS — Z79810 Long term (current) use of selective estrogen receptor modulators (SERMs): Secondary | ICD-10-CM

## 2021-09-02 DIAGNOSIS — Z170 Estrogen receptor positive status [ER+]: Secondary | ICD-10-CM | POA: Diagnosis not present

## 2021-09-02 DIAGNOSIS — D751 Secondary polycythemia: Secondary | ICD-10-CM | POA: Insufficient documentation

## 2021-09-02 DIAGNOSIS — Z923 Personal history of irradiation: Secondary | ICD-10-CM | POA: Insufficient documentation

## 2021-09-02 LAB — COMPREHENSIVE METABOLIC PANEL
ALT: 18 U/L (ref 0–44)
AST: 26 U/L (ref 15–41)
Albumin: 3.6 g/dL (ref 3.5–5.0)
Alkaline Phosphatase: 42 U/L (ref 38–126)
Anion gap: 7 (ref 5–15)
BUN: 22 mg/dL (ref 8–23)
CO2: 32 mmol/L (ref 22–32)
Calcium: 9.4 mg/dL (ref 8.9–10.3)
Chloride: 99 mmol/L (ref 98–111)
Creatinine, Ser: 0.84 mg/dL (ref 0.44–1.00)
GFR, Estimated: >60 mL/min (ref >60)
Glucose, Bld: 135 mg/dL — ABNORMAL HIGH (ref 70–99)
Potassium: 4.2 mmol/L (ref 3.5–5.1)
Sodium: 138 mmol/L (ref 135–145)
Total Bilirubin: 0.3 mg/dL (ref 0.3–1.2)
Total Protein: 7.1 g/dL (ref 6.5–8.1)

## 2021-09-02 LAB — CBC WITH DIFFERENTIAL/PLATELET
Abs Immature Granulocytes: 0.03 10*3/uL (ref 0.00–0.07)
Basophils Absolute: 0.1 10*3/uL (ref 0.0–0.1)
Basophils Relative: 1 %
Eosinophils Absolute: 0.1 10*3/uL (ref 0.0–0.5)
Eosinophils Relative: 2 %
HCT: 48.1 % — ABNORMAL HIGH (ref 36.0–46.0)
Hemoglobin: 15.8 g/dL — ABNORMAL HIGH (ref 12.0–15.0)
Immature Granulocytes: 1 %
Lymphocytes Relative: 22 %
Lymphs Abs: 1.2 10*3/uL (ref 0.7–4.0)
MCH: 32 pg (ref 26.0–34.0)
MCHC: 32.8 g/dL (ref 30.0–36.0)
MCV: 97.4 fL (ref 80.0–100.0)
Monocytes Absolute: 0.7 10*3/uL (ref 0.1–1.0)
Monocytes Relative: 12 %
Neutro Abs: 3.6 10*3/uL (ref 1.7–7.7)
Neutrophils Relative %: 62 %
Platelets: 191 10*3/uL (ref 150–400)
RBC: 4.94 MIL/uL (ref 3.87–5.11)
RDW: 13 % (ref 11.5–15.5)
WBC: 5.6 10*3/uL (ref 4.0–10.5)
nRBC: 0 % (ref 0.0–0.2)

## 2021-09-02 NOTE — Progress Notes (Signed)
Patient here follow up. Patient voices no new concerns.

## 2021-09-02 NOTE — Progress Notes (Signed)
[facility] day:  09/02/21  Chief Complaint: Sabrina Zamora is a 88 y.o. female with a history of secondary polycythemia presents for follow up .   HPI:  Patient previously followed up with Dr. Mike Gip.  Patient switched care to me on 11/29/2018. Extensive chart review was performed.  #Secondary polycythemia Patient has a history of sleep apnea she wears oxygen at night but no CPAP mask.  Never smoker. Work-up on 01/11/2012 reviewed and negative Jak 2 V617F mutation, exon 12 mutation. 08/25/2018 CALR negative, JAK 2 E12-15 negative, MPL negative.  Patient has been on phlebotomy program to keep hematocrit less than 48 and then later to keep it less than 50.  #History of melanoma x3.  T1 a melanoma in August 2012. saw Dermatology and had topical chemotherapy treatment to her forehead lesion.   #She has been compliant with CPAP machine. She did nocturnal oxygen testing and she is not qualified for nocturnal oxygen.   # Lung nodule -presumed lung cancer. She is a never smoker.  Report second hand smoke exposure when she grew up and during the initial few years of her marriage.    Her husband stopped smoking in 1960s.   # 06/26/2020, CT without contrast showed a partial solid nodule within the superior segment of the left lower pole.  This measures 2.4 cm with a solid component measuring 5 mm.  Bronchiectasis with tree-in-bud nodularity is noted within the anterior and posterior basal portions of the right upper lobe.  10/06/2020 CT chest wo contrast - mixed solid and ground-glass nodule of  the anterior superior segment left lower lobe, with tenting of the adjacent fissural pleura. The overall dimensions are approximately 2.8 x 2.4 cm, previously 2.6 x 2.3 cm when measured similarly, with nodular components measuring approximately 0.5 cm and 0.7 cm. Central nodular components appear somewhat more solid than on comparison prior although are not  substantially changed in size a new 5 mm pulmonary nodule of the lateral segment right middle lobe, nonspecific.Stable 4 mm nodule of the anterior right lower lobe, which remains nonspecific.4. There are numerous additional unchanged clustered and tree-in-bud centrilobular nodules, particularly in the posterior right upper lobe. 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  Patient's case was discussed on tumor board on 10/15/2020.  Consensus reached about proceed with biopsy versus PET scan+ empiric SBRT given her age. 10/28/2020, PET scan did not show activity of the left lower lobe nodule.  However her tumor board discussion, CT appearance is indicated for of lung cancer.  Patient was referred to establish care with Dr. Baruch Gouty radiation oncology and was evaluated on 11/03/2020.  RadOnc recommend watchful waiting for the time being.  Should the nodule continues to solidify and grow patient will be offered empirically SBRT at that time.  05/04/2021, CT chest showed continued slow growth of left lower lobe lung lesion. 06/23/2021, patient finished SBRT to left lower lung lesion.   Left breast DCIS with microinvasive component 09/25/20 screening mammogram showed possible distortion and calcifications in the left breast.  10/08/2020 left diagnostic mammogram showed Grouped microcalcifications in the left breast, Four circumscribed hypoechoic masses in the left breast may represent complicated cysts and are probably benign.  10/14/2020 left breast upper outer quadrant at middle depth-- intermediate grade DCIS, 61mm, with microcalcifications.  11/25/2020, patient underwent left lumpectomy by Dr. Dahlia Byes There is microinvasive mammary carcinoma with high-grade DCIS.  Negative margins.  pTmi pNx Given her advanced age, multiple other  medical conditions, strongly ER positivity, informed decision was made to omit sentinel lymph node biopsy. Case was discussed on breast cancer  tumor board.  01/14/2021 finished radiation.  02/03/2021 started on tamoxifen  INTERVAL HISTORY Sabrina Zamora is a 88 y.o. female who has above history reviewed by me today presents for follow up visit for erythrocytosis, lung nodule, left breast DCIS with microinvasive component During the interval, patient finished SBRT to left lower lobe lesion. Patient takes tamoxifen and tolerates well. Compliant with CPAP machine.  Takes aspirin 81 mg Denies any new breast complaints.  .    Review of Systems  Constitutional:  Negative for appetite change, chills, fatigue and fever.  HENT:   Negative for hearing loss and voice change.   Eyes:  Negative for eye problems.  Respiratory:  Negative for chest tightness and cough.   Cardiovascular:  Negative for chest pain.  Gastrointestinal:  Negative for abdominal distention, abdominal pain and blood in stool.  Endocrine: Negative for hot flashes.  Genitourinary:  Negative for difficulty urinating and frequency.   Musculoskeletal:  Negative for arthralgias.  Skin:  Negative for itching and rash.  Neurological:  Negative for extremity weakness.  Hematological:  Negative for adenopathy.  Psychiatric/Behavioral:  Negative for confusion.    Past Medical History:  Diagnosis Date   Asthma    GERD (gastroesophageal reflux disease)    High cholesterol    Hypertension    Hypothyroidism    Melanoma ([place]) 1986, 2008, 2016, 2021   melenoma left arm, left leg, back, back respectively   Osteoporosis    Polycythemia vera ([place])    Skin cancer 2012, 2016   Sleep apnea     Past Surgical History:  Procedure Laterality Date   BREAST BIOPSY Left 2003   neg   BREAST BIOPSY Right 09/27/2017   12:00 1 cmfn fibroadenomatous change    BREAST BIOPSY Right 09/17/2017   12:00 3 cmfn benign breast and fibroadipose tissue   BREAST BIOPSY Left 10/14/2020   stereo biopsy, ribbon clip/ INTERMEDIATE GRADE DUCTAL CARCINOMA IN SITU    BREAST EXCISIONAL BIOPSY Left  1988   excisional - neg   BREAST LUMPECTOMY Left 11/25/2020   BREAST LUMPECTOMY WITH RADIOFREQUENCY TAG IDENTIFICATION Left 60/04/9322   Procedure: BREAST LUMPECTOMY WITH RADIOFREQUENCY TAG IDENTIFICATION;  Surgeon: Jules Husbands, MD;  Location: [facility];  Service: General;  Laterality: Left;   CATARACT EXTRACTION W/PHACO Left 09/20/2017   Procedure: CATARACT EXTRACTION PHACO AND INTRAOCULAR LENS PLACEMENT ([place]) LEFT;  Surgeon: Leandrew Koyanagi, MD;  Location: [facility];  Service: Ophthalmology;  Laterality: Left;   CATARACT EXTRACTION W/PHACO Right 02/15/2018   Procedure: CATARACT EXTRACTION PHACO AND INTRAOCULAR LENS PLACEMENT ([place]) TOPICAL  RIGHT;  Surgeon: Leandrew Koyanagi, MD;  Location: [facility];  Service: Ophthalmology;  Laterality: Right;  prefers early   CHOLECYSTECTOMY     EYE SURGERY      Family History  Problem Relation Age of Onset   Clotting disorder Sister    Breast cancer Sister 62   Heart disease Mother    Heart disease Father     Social History:  reports that she has never smoked. She has never used smokeless tobacco. She reports that she does not drink alcohol and does not use drugs.  She has never smoked.  The patient is alone today.  Allergies:  Allergies  Allergen Reactions   Levofloxacin Rash and Shortness Of Breath   Penicillins Hives and Rash   Sulfa Antibiotics Rash  Tobramycin Other (See Comments)    Unknown   Betadine [Povidone Iodine] Rash   Ciprofloxacin Itching and Rash    Current Medications: Current Outpatient Medications  Medication Sig Dispense Refill   amLODipine (NORVASC) 2.5 MG tablet Take 1 tablet (2.5 mg total) by mouth daily. 45 tablet 3   aspirin EC 81 MG tablet Take 81 mg by mouth 3 (three) times a week.     Cholecalciferol (VITAMIN D) 50 MCG (2000 UT) CAPS Take 2,000 Units by mouth daily.     denosumab (PROLIA) 60 MG/ML SOSY injection Inject 60 mg into the skin every 6 (six) months.      fluticasone-salmeterol (ADVAIR) 250-50 MCG/ACT AEPB INHALE 1 PUFF INTO THE LUNGS TWICE DAILY 60 each 3   levothyroxine (SYNTHROID) 25 MCG tablet TAKE ONE AND ONE-HALF TABLETS DAILY BEFORE BREAKFAST 135 tablet 3   lovastatin (MEVACOR) 20 MG tablet TAKE 1 TABLET BY MOUTH NIGHTLY 90 tablet 2   Multiple Vitamin (MULTI-VITAMINS) TABS Take 1 tablet by mouth daily.     mupirocin nasal ointment (BACTROBAN) 2 % Place 1 application into the nose at bedtime. 10 g 1   tamoxifen (NOLVADEX) 20 MG tablet Take 1 tablet (20 mg total) by mouth daily. 30 tablet 6   Zinc 25 MG TABS Take 25 mg by mouth daily.     No current facility-administered medications for this visit.    Physical Exam: Blood pressure (!) 165/93, pulse 88, temperature (!) 97.3 F (36.3 C), temperature source Tympanic, weight 95 lb 6.4 oz (43.3 kg).  Physical Exam Constitutional:      General: She is not in acute distress.    Appearance: Normal appearance. She is not diaphoretic.     Comments: Thin built, walks independently.   HENT:     Head: Normocephalic and atraumatic.     Nose: Nose normal.     Mouth/Throat:     Pharynx: No oropharyngeal exudate.  Eyes:     General: No scleral icterus.    Pupils: Pupils are equal, round, and reactive to light.  Cardiovascular:     Rate and Rhythm: Normal rate and regular rhythm.     Heart sounds: No murmur heard. Pulmonary:     Effort: Pulmonary effort is normal. No respiratory distress.  Abdominal:     Palpations: Abdomen is soft.  Musculoskeletal:        General: Normal range of motion.     Cervical back: Normal range of motion and neck supple.  Skin:    General: Skin is dry.     Findings: No rash.  Neurological:     Mental Status: She is alert and oriented to person, place, and time. Mental status is at baseline.     Cranial Nerves: Cranial nerve deficit present.     Motor: No abnormal muscle tone.  Psychiatric:        Mood and Affect: Mood and affect normal.    Appointment on  09/02/2021  Component Date Value Ref Range Status   Sodium 09/02/2021 138  135 - 145 mmol/L Final   Potassium 09/02/2021 4.2  3.5 - 5.1 mmol/L Final   Chloride 09/02/2021 99  98 - 111 mmol/L Final   CO2 09/02/2021 32  22 - 32 mmol/L Final   Glucose, Bld 09/02/2021 135 (H)  70 - 99 mg/dL Final   Glucose reference range applies only to samples taken after fasting for at least 8 hours.   BUN 09/02/2021 22  8 - 23 mg/dL Final  Creatinine, Ser 09/02/2021 0.84  0.44 - 1.00 mg/dL Final   Calcium 09/02/2021 9.4  8.9 - 10.3 mg/dL Final   Total Protein 09/02/2021 7.1  6.5 - 8.1 g/dL Final   Albumin 09/02/2021 3.6  3.5 - 5.0 g/dL Final   AST 09/02/2021 26  15 - 41 U/L Final   ALT 09/02/2021 18  0 - 44 U/L Final   Alkaline Phosphatase 09/02/2021 42  38 - 126 U/L Final   Total Bilirubin 09/02/2021 0.3  0.3 - 1.2 mg/dL Final   GFR, Estimated 09/02/2021 >60  >60 mL/min Final   Comment: (NOTE) Calculated using the CKD-EPI Creatinine Equation (2021)    Anion gap 09/02/2021 7  5 - 15 Final   Performed at [facility], [place], [place] [address]   WBC 09/02/2021 5.6  4.0 - 10.5 K/uL Final   RBC 09/02/2021 4.94  3.87 - 5.11 MIL/uL Final   Hemoglobin 09/02/2021 15.8 (H)  12.0 - 15.0 g/dL Final   HCT 09/02/2021 48.1 (H)  36.0 - 46.0 % Final   MCV 09/02/2021 97.4  80.0 - 100.0 fL Final   MCH 09/02/2021 32.0  26.0 - 34.0 pg Final   MCHC 09/02/2021 32.8  30.0 - 36.0 g/dL Final   RDW 09/02/2021 13.0  11.5 - 15.5 % Final   Platelets 09/02/2021 191  150 - 400 K/uL Final   nRBC 09/02/2021 0.0  0.0 - 0.2 % Final   Neutrophils Relative % 09/02/2021 62  % Final   Neutro Abs 09/02/2021 3.6  1.7 - 7.7 K/uL Final   Lymphocytes Relative 09/02/2021 22  % Final   Lymphs Abs 09/02/2021 1.2  0.7 - 4.0 K/uL Final   Monocytes Relative 09/02/2021 12  % Final   Monocytes Absolute 09/02/2021 0.7  0.1 - 1.0 K/uL Final   Eosinophils Relative 09/02/2021 2  % Final   Eosinophils Absolute 09/02/2021  0.1  0.0 - 0.5 K/uL Final   Basophils Relative 09/02/2021 1  % Final   Basophils Absolute 09/02/2021 0.1  0.0 - 0.1 K/uL Final   Immature Granulocytes 09/02/2021 1  % Final   Abs Immature Granulocytes 09/02/2021 0.03  0.00 - 0.07 K/uL Final   Performed at [facility], [address] [place], [place] [address]    Assessment:  Sabrina Zamora is a 88 y.o. female present for follow up of erythrocytosis.  1. Ductal carcinoma in situ (DCIS) of left breast with microinvasive component (HCC)   2. Long-term current use of tamoxifen   3. Ductal carcinoma in situ (DCIS) of left breast    #Lung nodule found on chest x-ray and confirmed on CT scan, no FDG activity on PET scan. Patient is status post SBRT finished in December 2023 Surveillance CT scan ordered reviewed radiation oncologist office.   #Left breast DCIS with microinvasive component, pTMi pN0 Status post lumpectomy.  No sentinel lymph node biopsy was done. S/p RT Tolerates Tamoxifen, recommend patient to continue. Plan 5 years- end August 2027 Recommend patient to take Asprin 81mg  daily for thrombosis prophylaxis.  We will obtain annual bilateral diagnostic mammogram in March 2023. I recommend annual pelvic examination while on tamoxifen.  Refer patient establish care with gynecologist.  # history of osteoprosis, history of right foot fracture, on prolia - not managed by me  # erythrocytosis, presumed to be secondary to sleep apnea.  Labs reviewed and discussed with patient. Phlebotomy criteria has been historically be PRN Hct >50.  No need for phlebotomy  today.  I discussed with patient about increasing hematocrit threshold to be 52 and patient prefers to be kept at 50.    Follow up plan 3 months lab//-Phlebotomy (cbc) 6 months lab/MD+/- Phlebotomy (cbc cmp)   We spent sufficient time to discuss many aspect of care, questions were answered to patient's satisfaction.   Earlie Server, MD  09/02/2021, 8:25 PM

## 2021-09-02 NOTE — Progress Notes (Signed)
No phlebotomy today per Dr Tasia Catchings.

## 2021-09-04 ENCOUNTER — Telehealth: Payer: Self-pay | Admitting: *Deleted

## 2021-09-04 DIAGNOSIS — Z08 Encounter for follow-up examination after completed treatment for malignant neoplasm: Secondary | ICD-10-CM | POA: Diagnosis not present

## 2021-09-04 DIAGNOSIS — Z85828 Personal history of other malignant neoplasm of skin: Secondary | ICD-10-CM | POA: Diagnosis not present

## 2021-09-04 DIAGNOSIS — Z872 Personal history of diseases of the skin and subcutaneous tissue: Secondary | ICD-10-CM | POA: Diagnosis not present

## 2021-09-04 DIAGNOSIS — D225 Melanocytic nevi of trunk: Secondary | ICD-10-CM | POA: Diagnosis not present

## 2021-09-04 DIAGNOSIS — Z85820 Personal history of malignant melanoma of skin: Secondary | ICD-10-CM | POA: Diagnosis not present

## 2021-09-04 NOTE — Telephone Encounter (Signed)
Received call from [place] at [facility], states they received a fax on pt and was unsure why.  RN reviewed chart and saw that Dr Tasia Catchings wanted pt to establish care for annual pelvic exams while on tamoxifen.  Becky verbalized understanding and stated they would set an appt up with the patient.

## 2021-09-07 ENCOUNTER — Other Ambulatory Visit: Payer: Self-pay | Admitting: Internal Medicine

## 2021-09-09 ENCOUNTER — Other Ambulatory Visit: Payer: Self-pay | Admitting: Internal Medicine

## 2021-09-09 DIAGNOSIS — J449 Chronic obstructive pulmonary disease, unspecified: Secondary | ICD-10-CM

## 2021-09-29 ENCOUNTER — Other Ambulatory Visit: Payer: Medicare Other

## 2021-09-30 ENCOUNTER — Other Ambulatory Visit: Payer: Self-pay | Admitting: Physician Assistant

## 2021-09-30 DIAGNOSIS — J449 Chronic obstructive pulmonary disease, unspecified: Secondary | ICD-10-CM

## 2021-10-01 ENCOUNTER — Other Ambulatory Visit: Payer: Self-pay

## 2021-10-01 ENCOUNTER — Ambulatory Visit
Admission: RE | Admit: 2021-10-01 | Discharge: 2021-10-01 | Disposition: A | Discharge status: Home / Self Care | Payer: Medicare Other | Source: Ambulatory Visit | Attending: Oncology | Admitting: Oncology

## 2021-10-01 DIAGNOSIS — C50912 Malignant neoplasm of unspecified site of left female breast: Secondary | ICD-10-CM

## 2021-10-01 DIAGNOSIS — D0512 Intraductal carcinoma in situ of left breast: Secondary | ICD-10-CM

## 2021-10-01 DIAGNOSIS — R922 Inconclusive mammogram: Secondary | ICD-10-CM | POA: Diagnosis not present

## 2021-10-05 DIAGNOSIS — Z79810 Long term (current) use of selective estrogen receptor modulators (SERMs): Secondary | ICD-10-CM | POA: Diagnosis not present

## 2021-10-07 ENCOUNTER — Ambulatory Visit: Payer: Medicare Other | Admitting: Surgery

## 2021-10-21 ENCOUNTER — Other Ambulatory Visit: Payer: Self-pay | Admitting: *Deleted

## 2021-10-21 MED ORDER — TAMOXIFEN CITRATE 20 MG PO TABS: 20.0000 mg | ORAL_TABLET | Freq: Every day | ORAL | 1 refills | Status: DC

## 2021-10-29 ENCOUNTER — Telehealth: Payer: Self-pay

## 2021-10-29 ENCOUNTER — Other Ambulatory Visit: Payer: Self-pay

## 2021-10-29 DIAGNOSIS — J449 Chronic obstructive pulmonary disease, unspecified: Secondary | ICD-10-CM

## 2021-10-29 MED ORDER — ADVAIR DISKUS 250-50 MCG/ACT IN AEPB: 1.0000 | INHALATION_SPRAY | Freq: Two times a day (BID) | RESPIRATORY_TRACT | 3 refills | Status: DC

## 2021-10-29 NOTE — Telephone Encounter (Signed)
Pt called that her insurance covered brand name advair change to brand name and [facility] to [facility]  ?

## 2021-10-30 ENCOUNTER — Ambulatory Visit
Admission: RE | Admit: 2021-10-30 | Discharge: 2021-10-30 | Disposition: A | Discharge status: Home / Self Care | Payer: Medicare Other | Source: Ambulatory Visit | Attending: Radiation Oncology | Admitting: Radiation Oncology

## 2021-10-30 DIAGNOSIS — J984 Other disorders of lung: Secondary | ICD-10-CM | POA: Diagnosis not present

## 2021-10-30 DIAGNOSIS — I700 Atherosclerosis of aorta: Secondary | ICD-10-CM | POA: Diagnosis not present

## 2021-10-30 DIAGNOSIS — J90 Pleural effusion, not elsewhere classified: Secondary | ICD-10-CM | POA: Diagnosis not present

## 2021-10-30 DIAGNOSIS — R918 Other nonspecific abnormal finding of lung field: Secondary | ICD-10-CM | POA: Diagnosis not present

## 2021-10-30 DIAGNOSIS — I2510 Atherosclerotic heart disease of native coronary artery without angina pectoris: Secondary | ICD-10-CM | POA: Diagnosis not present

## 2021-10-30 LAB — POCT I-STAT CREATININE: Creatinine, Ser: 0.8 mg/dL (ref 0.44–1.00)

## 2021-10-30 MED ORDER — IOHEXOL 300 MG/ML  SOLN: 75.0000 mL | Freq: Once | INTRAMUSCULAR | Status: DC | PRN

## 2021-10-30 MED ORDER — IOHEXOL 300 MG/ML  SOLN
60.0000 mL | Freq: Once | INTRAMUSCULAR | Status: AC | PRN
  Administered 2021-10-30: 60 mL via INTRAVENOUS

## 2021-11-05 ENCOUNTER — Encounter: Payer: Self-pay | Admitting: Radiation Oncology

## 2021-11-05 ENCOUNTER — Ambulatory Visit
Admission: RE | Admit: 2021-11-05 | Discharge: 2021-11-05 | Disposition: A | Discharge status: Home / Self Care | Payer: Medicare Other | Source: Ambulatory Visit | Attending: Radiation Oncology | Admitting: Radiation Oncology

## 2021-11-05 VITALS — BP 170/92 | HR 83 | Temp 97.3°F | Resp 18 | Wt 96.5 lb

## 2021-11-05 DIAGNOSIS — Z923 Personal history of irradiation: Secondary | ICD-10-CM | POA: Diagnosis not present

## 2021-11-05 DIAGNOSIS — Z86000 Personal history of in-situ neoplasm of breast: Secondary | ICD-10-CM | POA: Insufficient documentation

## 2021-11-05 DIAGNOSIS — R918 Other nonspecific abnormal finding of lung field: Secondary | ICD-10-CM | POA: Diagnosis not present

## 2021-11-05 DIAGNOSIS — Z08 Encounter for follow-up examination after completed treatment for malignant neoplasm: Secondary | ICD-10-CM | POA: Diagnosis not present

## 2021-11-05 DIAGNOSIS — J701 Chronic and other pulmonary manifestations due to radiation: Secondary | ICD-10-CM | POA: Diagnosis not present

## 2021-11-05 DIAGNOSIS — R911 Solitary pulmonary nodule: Secondary | ICD-10-CM | POA: Diagnosis not present

## 2021-11-05 DIAGNOSIS — D0512 Intraductal carcinoma in situ of left breast: Secondary | ICD-10-CM | POA: Diagnosis not present

## 2021-11-05 NOTE — Addendum Note (Signed)
Encounter addended by: Noreene Filbert, MD on: 11/05/2021 2:43 PM ? Actions taken: Level of Service modified

## 2021-11-05 NOTE — Progress Notes (Signed)
Radiation Oncology ?Follow up Note ? ?Name: Sabrina Zamora   ?Date:   11/05/2021 ?MRN:  [id number] ?DOB: 1931-02-06  ? ? ?This 88 y.o. female presents to the clinic today for 61-month follow-up status post SBRT to her left lower lobe for stage I non-small cell lung cancer in patient previously treated to her breast back in 37 for DCIS. ? ?REFERRING PROVIDER: Mylinda Latina, PA* ? ?HPI: Patient is a 88 year old female now out 4 months having completed SBRT to her left lower lobe for presumed stage I non-small cell lung cancer seen today in routine follow-up she is doing well.  She specifically denies hemoptysis or chest tightness..  She has a mild nonproductive cough.  She had mammograms last month which were BI-RADS 2 benign which I have reviewed.  Also had a recent CT scan of her chest showing extensive radiation fibrosis in the area of previous SBRT consistent with radiation change.  No evidence of progressive recurrent disease noted. ? ?COMPLICATIONS OF TREATMENT: none ? ?FOLLOW UP COMPLIANCE: keeps appointments  ? ?PHYSICAL EXAM:  ?BP (!) 170/92   Pulse 83   Temp (!) 97.3 ?F (36.3 ?C)   Resp 18   Wt 96 lb 8 oz (43.8 kg)   BMI 17.37 kg/m?  ?Well-developed well-nourished patient in NAD. HEENT reveals PERLA, EOMI, discs not visualized.  Oral cavity is clear. No oral mucosal lesions are identified. Neck is clear without evidence of cervical or supraclavicular adenopathy. Lungs are clear to A&P. Cardiac examination is essentially unremarkable with regular rate and rhythm without murmur rub or thrill. Abdomen is benign with no organomegaly or masses noted. Motor sensory and DTR levels are equal and symmetric in the upper and lower extremities. Cranial nerves II through XII are grossly intact. Proprioception is intact. No peripheral adenopathy or edema is identified. No motor or sensory levels are noted. Crude visual fields are within normal range. ? ?RADIOLOGY RESULTS: CT scan of chest and mammograms reviewed  compatible with above-stated findings ? ?PLAN: At this time patient is doing well no significant clinical change prior to SBRT.  Her mammograms are fine.  Of asked to see her back in 6 months for follow-up with a repeat CT scan without contrast.  She has had some troubles with side effects from contrast CT scan.  Patient knows to call with any concerns. ? ?I would like to take this opportunity to thank you for allowing me to participate in the care of your patient.. ?  ? Noreene Filbert, MD ? ?

## 2021-11-09 ENCOUNTER — Encounter: Payer: Self-pay | Admitting: Physician Assistant

## 2021-11-09 ENCOUNTER — Ambulatory Visit (INDEPENDENT_AMBULATORY_CARE_PROVIDER_SITE_OTHER): Payer: Medicare Other | Admitting: Physician Assistant

## 2021-11-09 DIAGNOSIS — E782 Mixed hyperlipidemia: Secondary | ICD-10-CM

## 2021-11-09 DIAGNOSIS — I10 Essential (primary) hypertension: Secondary | ICD-10-CM

## 2021-11-09 DIAGNOSIS — D0512 Intraductal carcinoma in situ of left breast: Secondary | ICD-10-CM

## 2021-11-09 DIAGNOSIS — R918 Other nonspecific abnormal finding of lung field: Secondary | ICD-10-CM

## 2021-11-09 DIAGNOSIS — E039 Hypothyroidism, unspecified: Secondary | ICD-10-CM

## 2021-11-09 DIAGNOSIS — G4733 Obstructive sleep apnea (adult) (pediatric): Secondary | ICD-10-CM | POA: Diagnosis not present

## 2021-11-09 NOTE — Progress Notes (Signed)
[facility] ?[address] ?[place], [place] [address] ? ?Internal MEDICINE  ?Office Visit Note ? ?Patient Name: Sabrina Zamora ? [id number]  ?[id number] ? ?Date of Service: 11/09/2021 ? ?Chief Complaint  ?Patient presents with  ? Follow-up  ? Gastroesophageal Reflux  ? Hypertension  ?  Pt states that BP is always high when coming into the dr office  ? Hyperlipidemia  ? Quality Metric Gaps  ?  Pneumonia and Shingles Vaccine  ? ? ?HPI ?Pt is here for routine follow up ?-BP stable at home ?-taking cholesterol med every day ?-Had follow up with oncology and is stable and has follow up scan in 38months. She was treated with SBRT of left lower lobe for stage I non-small cell lung cancer 4 months ago and previously for DCIS. She continues on Tamoxifen ?-breathing stable ?-wears cpap nightly ?-took amlodipine 35min prior to visit. Will start to check bp at home again as she stopped for awhile. Will have her increase to 5mg  while monitoring at home, but if dropping low will cut back to 2.5mg  if needed ?-reports she believes she has had PNA vaccines previously, but unsure when ? ?Current Medication: ?Outpatient Encounter Medications as of 11/09/2021  ?Medication Sig  ? ADVAIR DISKUS 250-50 MCG/ACT AEPB Inhale 1 puff into the lungs in the morning and at bedtime.  ? amLODipine (NORVASC) 2.5 MG tablet TAKE 1 TABLET BY MOUTH ONCE DAILY  ? aspirin EC 81 MG tablet Take 81 mg by mouth 3 (three) times a week.  ? Cholecalciferol (VITAMIN D) 50 MCG (2000 UT) CAPS Take 2,000 Units by mouth daily.  ? denosumab (PROLIA) 60 MG/ML SOSY injection Inject 60 mg into the skin every 6 (six) months.  ? levothyroxine (SYNTHROID) 25 MCG tablet TAKE ONE AND ONE-HALF TABLETS DAILY BEFORE BREAKFAST  ? lovastatin (MEVACOR) 20 MG tablet TAKE 1 TABLET BY MOUTH NIGHTLY  ? Multiple Vitamin (MULTI-VITAMINS) TABS Take 1 tablet by mouth daily.  ? mupirocin nasal ointment (BACTROBAN) 2 % Place 1 application into the nose at bedtime.  ? tamoxifen  (NOLVADEX) 20 MG tablet Take 1 tablet (20 mg total) by mouth daily.  ? Zinc 25 MG TABS Take 25 mg by mouth daily.  ? ?No facility-administered encounter medications on file as of 11/09/2021.  ? ? ?Surgical History: ?Past Surgical History:  ?Procedure Laterality Date  ? BREAST BIOPSY Left 2003  ? neg  ? BREAST BIOPSY Right 09/27/2017  ? 12:00 1 cmfn fibroadenomatous change   ? BREAST BIOPSY Right 09/17/2017  ? 12:00 3 cmfn benign breast and fibroadipose tissue  ? BREAST BIOPSY Left 10/14/2020  ? stereo biopsy, ribbon clip/ INTERMEDIATE GRADE DUCTAL CARCINOMA IN SITU   ? BREAST EXCISIONAL BIOPSY Left 1988  ? excisional - neg  ? BREAST LUMPECTOMY Left 11/25/2020  ? BREAST LUMPECTOMY WITH RADIOFREQUENCY TAG IDENTIFICATION Left 62/22/9798  ? Procedure: BREAST LUMPECTOMY WITH RADIOFREQUENCY TAG IDENTIFICATION;  Surgeon: Jules Husbands, MD;  Location: [facility];  Service: General;  Laterality: Left;  ? CATARACT EXTRACTION W/PHACO Left 09/20/2017  ? Procedure: CATARACT EXTRACTION PHACO AND INTRAOCULAR LENS PLACEMENT ([place]) LEFT;  Surgeon: Leandrew Koyanagi, MD;  Location: [place];  Service: Ophthalmology;  Laterality: Left;  ? CATARACT EXTRACTION W/PHACO Right 02/15/2018  ? Procedure: CATARACT EXTRACTION PHACO AND INTRAOCULAR LENS PLACEMENT ([place]) TOPICAL  RIGHT;  Surgeon: Leandrew Koyanagi, MD;  Location: [place];  Service: Ophthalmology;  Laterality: Right;  prefers early  ? CHOLECYSTECTOMY    ? EYE SURGERY    ? ? ?  Medical History: ?Past Medical History:  ?Diagnosis Date  ? Asthma   ? GERD (gastroesophageal reflux disease)   ? High cholesterol   ? Hypertension   ? Hypothyroidism   ? Melanoma ([place]) 1986, 2008, 2016, 2021  ? melenoma left arm, left leg, back, back respectively  ? Osteoporosis   ? Polycythemia vera ([place])   ? Skin cancer 2012, 2016  ? Sleep apnea   ? ? ?Family History: ?Family History  ?Problem Relation Age of Onset  ? Clotting disorder Sister   ? Breast cancer Sister 26  ? Heart  disease Mother   ? Heart disease Father   ? ? ?Social History  ? ?Socioeconomic History  ? Marital status: Married  ?  Spouse name: Gwyndolyn Saxon  ? Number of children: 3  ? Years of education: Not on file  ? Highest education level: Not on file  ?Occupational History  ? Not on file  ?Tobacco Use  ? Smoking status: Never  ? Smokeless tobacco: Never  ?Vaping Use  ? Vaping Use: Never used  ?Substance and Sexual Activity  ? Alcohol use: Never  ?  Alcohol/week: 0.0 standard drinks  ? Drug use: Never  ? Sexual activity: Not on file  ?Other Topics Concern  ? Not on file  ?Social History Narrative  ? Not on file  ? ?Social Determinants of Health  ? ?Financial Resource Strain: Not on file  ?Food Insecurity: Not on file  ?Transportation Needs: Not on file  ?Physical Activity: Not on file  ?Stress: Not on file  ?Social Connections: Not on file  ?Intimate Partner Violence: Not on file  ? ? ? ? ?Review of Systems  ?Constitutional:  Negative for chills, diaphoresis and fatigue.  ?HENT:  Negative for ear pain, postnasal drip and sinus pressure.   ?Eyes:  Negative for photophobia, discharge, redness, itching and visual disturbance.  ?Respiratory:  Negative for cough, shortness of breath and wheezing.   ?Cardiovascular:  Negative for chest pain, palpitations and leg swelling.  ?Gastrointestinal:  Negative for abdominal pain, constipation, diarrhea, nausea and vomiting.  ?Genitourinary:  Negative for dysuria and flank pain.  ?Musculoskeletal:  Negative for arthralgias, back pain, gait problem and neck pain.  ?Skin:  Negative for color change.  ?Allergic/Immunologic: Negative for environmental allergies and food allergies.  ?Neurological:  Negative for dizziness and headaches.  ?Hematological:  Does not bruise/bleed easily.  ?Psychiatric/Behavioral:  Negative for agitation, behavioral problems (depression), hallucinations and sleep disturbance.   ? ?Vital Signs: ?BP (!) 160/90 Comment: 161/91  Pulse 87   Temp 98.2 ?F (36.8 ?C)   Resp 16    Ht 5' 2.5" (1.588 m)   Wt 98 lb (44.5 kg)   SpO2 98%   BMI 17.64 kg/m?  ? ? ?Physical Exam ?Vitals and nursing note reviewed.  ?Constitutional:   ?   Appearance: Normal appearance. She is normal weight.  ?HENT:  ?   Head: Normocephalic and atraumatic.  ?Eyes:  ?   Pupils: Pupils are equal, round, and reactive to light.  ?Cardiovascular:  ?   Rate and Rhythm: Normal rate and regular rhythm.  ?   Pulses: Normal pulses.  ?   Heart sounds: Normal heart sounds.  ?Pulmonary:  ?   Effort: Pulmonary effort is normal.  ?   Breath sounds: Normal breath sounds.  ?Abdominal:  ?   General: Abdomen is flat.  ?   Palpations: Abdomen is soft.  ?Musculoskeletal:     ?   General: Normal range of motion.  ?  Cervical back: Normal range of motion.  ?Skin: ?   General: Skin is warm.  ?Neurological:  ?   General: No focal deficit present.  ?   Mental Status: She is alert and oriented to person, place, and time. Mental status is at baseline.  ?Psychiatric:     ?   Mood and Affect: Mood normal.     ?   Behavior: Behavior normal.     ?   Thought Content: Thought content normal.     ?   Judgment: Judgment normal.  ? ? ? ? ? ?Assessment/Plan: ?1. Essential hypertension ?Elevated in office, we will go ahead and increase to 5 mg of amlodipine and have patient start to monitor at home regularly and will follow-up in 4 weeks.  Advised that if blood pressure is low at home may drop back down to 2.5 mg if needed ? ?2. Acquired hypothyroidism ?Stable, continue current medication ? ?3. Ductal carcinoma in situ (DCIS) of left breast ?Followed by oncology ? ?4. Multiple lung nodules on CT ?Followed by oncology ? ?5. Obstructive sleep apnea ?Continue CPAP nightly ? ?6. Mixed hyperlipidemia ?Labs stable continue current medication ? ? ?General Counseling: Milea verbalizes understanding of the findings of todays visit and agrees with plan of treatment. I have discussed any further diagnostic evaluation that may be needed or ordered today. We also  reviewed her medications today. she has been encouraged to call the office with any questions or concerns that should arise related to todays visit. ? ? ? ?No orders of the defined types were placed in this encounter.

## 2021-11-23 ENCOUNTER — Ambulatory Visit (INDEPENDENT_AMBULATORY_CARE_PROVIDER_SITE_OTHER): Payer: Medicare Other | Admitting: Surgery

## 2021-11-23 ENCOUNTER — Encounter: Payer: Self-pay | Admitting: Surgery

## 2021-11-23 VITALS — BP 176/82 | HR 77 | Temp 97.6°F | Wt 97.2 lb

## 2021-11-23 DIAGNOSIS — D0512 Intraductal carcinoma in situ of left breast: Secondary | ICD-10-CM | POA: Diagnosis not present

## 2021-11-23 NOTE — Patient Instructions (Addendum)
If you have any concerns or questions, please feel free to call our office. Follow up in 1 year. ? ?Breast Self-Awareness ?Breast self-awareness is knowing how your breasts look and feel. You need to: ?Check your breasts on a regular basis. ?Tell your doctor about any changes. ?Become familiar with the look and feel of your breasts. This can help you catch a breast problem while it is still small and can be treated. You should do breast self-exams even if you have breast implants. ?What you need: ?A mirror. ?A well-lit room. ?A pillow or other soft object. ?How to do a breast self-exam ?Follow these steps to do a breast self-exam: ?Look for changes ? ?Take off all the clothes above your waist. ?Stand in front of a mirror in a room with good lighting. ?Put your hands down at your sides. ?Compare your breasts in the mirror. Look for any difference between them, such as: ?A difference in shape. ?A difference in size. ?Wrinkles, dips, and bumps in one breast and not the other. ?Look at each breast for changes in the skin, such as: ?Redness. ?Scaly areas. ?Skin that has gotten thicker. ?Dimpling. ?Open sores (ulcers). ?Look for changes in your nipples, such as: ?Fluid coming out of a nipple. ?Fluid around a nipple. ?Bleeding. ?Dimpling. ?Redness. ?A nipple that looks pushed in (retracted), or that has changed position. ?Feel for changes ?Lie on your back. ?Feel each breast. To do this: ?Pick a breast to feel. ?Place a pillow under the shoulder closest to that breast. Put the arm closest to that breast behind your head. ?Feel the nipple area of that breast using the hand of your other arm. Feel the area with the pads of your three middle fingers by making small circles with your fingers. Use light, medium, and firm pressure. ?Continue the overlapping circles, moving downward over the breast. Keep making circles with your fingers. Stop when you feel your ribs. ?Start making circles with your fingers again, this time going  upward until you reach your collarbone. ?Then, make circles outward across your breast and into your armpit area. ?Squeeze your nipple. Check for discharge and lumps. ?Repeat these steps to check your other breast. ?Sit or stand in the tub or shower. ?With soapy water on your skin, feel each breast the same way you did when you were lying down. ?Write down what you find ?Writing down what you find can help you remember what to tell your doctor. Write down: ?What is normal for each breast. ?Any changes you find in each breast. These include: ?The kind of changes you find. ?A tender or painful breast. ?Any lump you find. Write down its size and where it is. ?When you last had your monthly period (menstrual cycle). ?General tips ?If you are breastfeeding, the best time to check your breasts is after you feed your baby or after you use a breast pump. ?If you get monthly bleeding, the best time to check your breasts is 5-7 days after your monthly cycle ends. ?With time, you will become comfortable with the self-exam. You will also start to know if there are changes in your breasts. ?Contact a doctor if: ?You see a change in the shape or size of your breasts or nipples. ?You see a change in the skin of your breast or nipples, such as red or scaly skin. ?You have fluid coming from your nipples that is not normal. ?You find a new lump or thick area. ?You have breast pain. ?You have  any concerns about your breast health. ?Summary ?Breast self-awareness includes looking for changes in your breasts and feeling for changes within your breasts. ?You should do breast self-awareness in front of a mirror in a well-lit room. ?If you get monthly periods (menstrual cycles), the best time to check your breasts is 5-7 days after your period ends. ?Tell your doctor about any changes you see in your breasts. Changes include changes in size, changes on the skin, painful or tender breasts, or fluid from your nipples that is not  normal. ?This information is not intended to replace advice given to you by your health care provider. Make sure you discuss any questions you have with your health care provider. ?Document Revised: 04/30/2021 Document Reviewed: 04/30/2021 ?Elsevier Patient Education ? [place]. ? ?

## 2021-11-24 NOTE — Progress Notes (Signed)
Outpatient Surgical Follow Up ? ?11/24/2021 ? ?Sabrina Zamora is an 88 y.o. female.  ? ?Chief Complaint  ?Patient presents with  ? Follow-up  ?  Bil mammo 09/29/21  ? ? ?HPI: Sabrina Zamora is an 88 year old female status post lumpectomy last year for DCIS.  Evidence of microinvasion and patient declined sentinel node biopsy.  She is doing very well.  She recently had a mammogram that have personally reviewed showing no evidence of suspicious lesions on either breast. ?She als ohad lund lesion that was radiated. ?She has no complaints today ?Past Medical History:  ?Diagnosis Date  ? Asthma   ? GERD (gastroesophageal reflux disease)   ? High cholesterol   ? Hypertension   ? Hypothyroidism   ? Melanoma ([place]) 1986, 2008, 2016, 2021  ? melenoma left arm, left leg, back, back respectively  ? Osteoporosis   ? Polycythemia vera ([place])   ? Skin cancer 2012, 2016  ? Sleep apnea   ? ? ?Past Surgical History:  ?Procedure Laterality Date  ? BREAST BIOPSY Left 2003  ? neg  ? BREAST BIOPSY Right 09/27/2017  ? 12:00 1 cmfn fibroadenomatous change   ? BREAST BIOPSY Right 09/17/2017  ? 12:00 3 cmfn benign breast and fibroadipose tissue  ? BREAST BIOPSY Left 10/14/2020  ? stereo biopsy, ribbon clip/ INTERMEDIATE GRADE DUCTAL CARCINOMA IN SITU   ? BREAST EXCISIONAL BIOPSY Left 1988  ? excisional - neg  ? BREAST LUMPECTOMY Left 11/25/2020  ? BREAST LUMPECTOMY WITH RADIOFREQUENCY TAG IDENTIFICATION Left 30/16/0109  ? Procedure: BREAST LUMPECTOMY WITH RADIOFREQUENCY TAG IDENTIFICATION;  Surgeon: Jules Husbands, MD;  Location: [facility];  Service: General;  Laterality: Left;  ? CATARACT EXTRACTION W/PHACO Left 09/20/2017  ? Procedure: CATARACT EXTRACTION PHACO AND INTRAOCULAR LENS PLACEMENT ([place]) LEFT;  Surgeon: Leandrew Koyanagi, MD;  Location: [place];  Service: Ophthalmology;  Laterality: Left;  ? CATARACT EXTRACTION W/PHACO Right 02/15/2018  ? Procedure: CATARACT EXTRACTION PHACO AND INTRAOCULAR LENS PLACEMENT ([place]) TOPICAL  RIGHT;   Surgeon: Leandrew Koyanagi, MD;  Location: [place] [facility];  Service: Ophthalmology;  Laterality: Right;  prefers early  ? CHOLECYSTECTOMY    ? EYE SURGERY    ? ? ?Family History  ?Problem Relation Age of Onset  ? Clotting disorder Sister   ? Breast cancer Sister 18  ? Heart disease Mother   ? Heart disease Father   ? ? ?Social History:  reports that she has never smoked. She has never used smokeless tobacco. She reports that she does not drink alcohol and does not use drugs. ? ?Allergies:  ?Allergies  ?Allergen Reactions  ? Levofloxacin Rash and Shortness Of Breath  ? Penicillins Hives and Rash  ? Sulfa Antibiotics Rash  ? Tobramycin Other (See Comments)  ?  Unknown  ? Betadine [Povidone Iodine] Rash  ? Ciprofloxacin Itching and Rash  ? ? ?Medications reviewed. ? ? ? ?ROS ?Full ROS performed and is otherwise negative other than what is stated in HPI ? ? ?BP (!) 176/82   Pulse 77   Temp 97.6 ?F (36.4 ?C) (Oral)   Wt 97 lb 3.2 oz (44.1 kg)   SpO2 99%   BMI 17.49 kg/m?  ? ?Physical Exam ?Vitals and nursing note reviewed. Exam conducted with a chaperone present.  ?Constitutional:   ?   General: She is not in acute distress. ?   Appearance: Normal appearance. She is normal weight. She is not ill-appearing.  ?Eyes:  ?   General: No scleral icterus.    ?  Right eye: No discharge.     ?   Left eye: No discharge.  ?Cardiovascular:  ?   Rate and Rhythm: Normal rate and regular rhythm.  ?   Heart sounds: No murmur heard. ?  No friction rub.  ?Pulmonary:  ?   Effort: Pulmonary effort is normal. No respiratory distress.  ?   Breath sounds: Normal breath sounds. No stridor. No wheezing.  ?   Comments: Breast: Evidence of prior lumpectomy in the left side there is no evidence of any significant deformity on the left breast or any new palpable lesions.  There is no evidence of lymphadenopathy on either side.  Evidence of radiation changes. ?Abdominal:  ?   General: Abdomen is flat. There is no distension.  ?    Palpations: There is no mass.  ?   Tenderness: There is no abdominal tenderness. There is no guarding or rebound.  ?   Hernia: No hernia is present.  ?Musculoskeletal:     ?   General: No swelling, tenderness or deformity. Normal range of motion.  ?   Cervical back: Normal range of motion and neck supple. No rigidity or tenderness.  ?Lymphadenopathy:  ?   Cervical: No cervical adenopathy.  ?Skin: ?   General: Skin is warm and dry.  ?   Capillary Refill: Capillary refill takes less than 2 seconds.  ?   Coloration: Skin is not jaundiced or pale.  ?   Findings: No bruising or erythema.  ?Neurological:  ?   General: No focal deficit present.  ?   Mental Status: She is alert and oriented to person, place, and time.  ?Psychiatric:     ?   Mood and Affect: Mood normal.     ?   Behavior: Behavior normal.     ?   Thought Content: Thought content normal.     ?   Judgment: Judgment normal.  ? ? ? ?Assessment/Plan: ?. This is an 88 year old female with history of DCIS in the left breast status postlumpectomy and radiation therapy.  No evidence of recurrence.  I will see her in 1 year with repeat mammogram. ? ?I spent Greater than 30 minutes including personally reviewing imaging studies, placing orders, counseling the patient and performing appropriate documentation ? ?Caroleen Hamman, MD FACS ?General Surgeon  ?

## 2021-11-25 ENCOUNTER — Other Ambulatory Visit: Payer: Self-pay | Admitting: Internal Medicine

## 2021-12-03 ENCOUNTER — Inpatient Hospital Stay: Payer: Medicare Other

## 2021-12-03 ENCOUNTER — Inpatient Hospital Stay: Payer: Medicare Other | Attending: Oncology

## 2021-12-03 DIAGNOSIS — D0512 Intraductal carcinoma in situ of left breast: Secondary | ICD-10-CM | POA: Insufficient documentation

## 2021-12-03 DIAGNOSIS — Z79899 Other long term (current) drug therapy: Secondary | ICD-10-CM | POA: Diagnosis not present

## 2021-12-03 DIAGNOSIS — D751 Secondary polycythemia: Secondary | ICD-10-CM | POA: Diagnosis not present

## 2021-12-03 DIAGNOSIS — Z79810 Long term (current) use of selective estrogen receptor modulators (SERMs): Secondary | ICD-10-CM | POA: Diagnosis not present

## 2021-12-03 DIAGNOSIS — Z170 Estrogen receptor positive status [ER+]: Secondary | ICD-10-CM | POA: Diagnosis not present

## 2021-12-03 DIAGNOSIS — Z923 Personal history of irradiation: Secondary | ICD-10-CM | POA: Insufficient documentation

## 2021-12-03 DIAGNOSIS — C50912 Malignant neoplasm of unspecified site of left female breast: Secondary | ICD-10-CM

## 2021-12-03 LAB — CBC WITH DIFFERENTIAL/PLATELET
Abs Immature Granulocytes: 0.04 10*3/uL (ref 0.00–0.07)
Basophils Absolute: 0.1 10*3/uL (ref 0.0–0.1)
Basophils Relative: 1 %
Eosinophils Absolute: 0.1 10*3/uL (ref 0.0–0.5)
Eosinophils Relative: 1 %
HCT: 47.2 % — ABNORMAL HIGH (ref 36.0–46.0)
Hemoglobin: 15.4 g/dL — ABNORMAL HIGH (ref 12.0–15.0)
Immature Granulocytes: 1 %
Lymphocytes Relative: 24 %
Lymphs Abs: 1.5 10*3/uL (ref 0.7–4.0)
MCH: 31.6 pg (ref 26.0–34.0)
MCHC: 32.6 g/dL (ref 30.0–36.0)
MCV: 96.7 fL (ref 80.0–100.0)
Monocytes Absolute: 0.8 10*3/uL (ref 0.1–1.0)
Monocytes Relative: 13 %
Neutro Abs: 3.7 10*3/uL (ref 1.7–7.7)
Neutrophils Relative %: 60 %
Platelets: 177 10*3/uL (ref 150–400)
RBC: 4.88 MIL/uL (ref 3.87–5.11)
RDW: 13.1 % (ref 11.5–15.5)
WBC: 6.2 10*3/uL (ref 4.0–10.5)
nRBC: 0 % (ref 0.0–0.2)

## 2021-12-03 NOTE — Progress Notes (Signed)
Per parameters, therapeutic phlebotomy not indicated today. Pt informed.

## 2021-12-10 ENCOUNTER — Ambulatory Visit (INDEPENDENT_AMBULATORY_CARE_PROVIDER_SITE_OTHER): Payer: Medicare Other | Admitting: Physician Assistant

## 2021-12-10 ENCOUNTER — Encounter: Payer: Self-pay | Admitting: Physician Assistant

## 2021-12-10 VITALS — BP 160/90 | HR 80 | Temp 97.8°F | Resp 16 | Ht 62.5 in | Wt 97.0 lb

## 2021-12-10 DIAGNOSIS — I10 Essential (primary) hypertension: Secondary | ICD-10-CM | POA: Diagnosis not present

## 2021-12-10 DIAGNOSIS — D0512 Intraductal carcinoma in situ of left breast: Secondary | ICD-10-CM | POA: Diagnosis not present

## 2021-12-10 NOTE — Progress Notes (Signed)
[facility] [place], [place] [address]  Internal MEDICINE  Office Visit Note  Patient Name: Sabrina Zamora  [id number]  [id number]  Date of Service: 12/16/2021  Chief Complaint  Patient presents with   Follow-up   Hyperlipidemia   Hypertension    HPI Pt is here for routine follow up -Has been taking 2.5mg  amlodipine. Home readings have been ok 120-148/70-90. Always high in the office, but her cuff read the same as manual reading in office which is reassuring that elevated bp in office is due to white coat syndrome and is better at home. Will still have her increase to 5mg  total of amlodipine daily due to borderline readings at home sometimes. She may take 2 tabs of the 2.5 mg together or separate and take BID dosing, not to exceed 5mg  total per day. She is going to monitor how she does with this and when due for refills may request 5mg  tab if once daily dosing or can continue 2.5 BID dosing. -Saw Dr. Dahlia Byes for follow up for DCIS and everything looked good  Current Medication: Outpatient Encounter Medications as of 12/10/2021  Medication Sig   ADVAIR DISKUS 250-50 MCG/ACT AEPB Inhale 1 puff into the lungs in the morning and at bedtime.   amLODipine (NORVASC) 2.5 MG tablet TAKE 1 TABLET BY MOUTH ONCE DAILY   aspirin EC 81 MG tablet Take 81 mg by mouth 3 (three) times a week.   Cholecalciferol (VITAMIN D) 50 MCG (2000 UT) CAPS Take 2,000 Units by mouth daily.   denosumab (PROLIA) 60 MG/ML SOSY injection Inject 60 mg into the skin every 6 (six) months.   levothyroxine (SYNTHROID) 25 MCG tablet TAKE ONE AND ONE-HALF TABLETS DAILY BEFORE BREAKFAST   lovastatin (MEVACOR) 20 MG tablet TAKE 1 TABLET BY MOUTH NIGHTLY   Multiple Vitamin (MULTI-VITAMINS) TABS Take 1 tablet by mouth daily.   mupirocin nasal ointment (BACTROBAN) 2 % Place 1 application into the nose at bedtime.   tamoxifen (NOLVADEX) 20 MG tablet Take 1 tablet (20 mg total) by mouth daily.   Zinc 25 MG  TABS Take 25 mg by mouth daily.   No facility-administered encounter medications on file as of 12/10/2021.    Surgical History: Past Surgical History:  Procedure Laterality Date   BREAST BIOPSY Left 2003   neg   BREAST BIOPSY Right 09/27/2017   12:00 1 cmfn fibroadenomatous change    BREAST BIOPSY Right 09/17/2017   12:00 3 cmfn benign breast and fibroadipose tissue   BREAST BIOPSY Left 10/14/2020   stereo biopsy, ribbon clip/ INTERMEDIATE GRADE DUCTAL CARCINOMA IN SITU    BREAST EXCISIONAL BIOPSY Left 1988   excisional - neg   BREAST LUMPECTOMY Left 11/25/2020   BREAST LUMPECTOMY WITH RADIOFREQUENCY TAG IDENTIFICATION Left 17/61/6073   Procedure: BREAST LUMPECTOMY WITH RADIOFREQUENCY TAG IDENTIFICATION;  Surgeon: Jules Husbands, MD;  Location: [facility];  Service: General;  Laterality: Left;   CATARACT EXTRACTION W/PHACO Left 09/20/2017   Procedure: CATARACT EXTRACTION PHACO AND INTRAOCULAR LENS PLACEMENT ([place]) LEFT;  Surgeon: Leandrew Koyanagi, MD;  Location: [place];  Service: Ophthalmology;  Laterality: Left;   CATARACT EXTRACTION W/PHACO Right 02/15/2018   Procedure: CATARACT EXTRACTION PHACO AND INTRAOCULAR LENS PLACEMENT ([place]) TOPICAL  RIGHT;  Surgeon: Leandrew Koyanagi, MD;  Location: [place];  Service: Ophthalmology;  Laterality: Right;  prefers early   CHOLECYSTECTOMY     EYE SURGERY      Medical History: Past Medical History:  Diagnosis Date  Asthma    GERD (gastroesophageal reflux disease)    High cholesterol    Hypertension    Hypothyroidism    Melanoma ([place]) 1986, 2008, 2016, 2021   melenoma left arm, left leg, back, back respectively   Osteoporosis    Polycythemia vera ([place])    Skin cancer 2012, 2016   Sleep apnea     Family History: Family History  Problem Relation Age of Onset   Clotting disorder Sister    Breast cancer Sister 31   Heart disease Mother    Heart disease Father     Social History   Socioeconomic  History   Marital status: Married    Spouse name: Gwyndolyn Saxon   Number of children: 3   Years of education: Not on file   Highest education level: Not on file  Occupational History   Not on file  Tobacco Use   Smoking status: Never   Smokeless tobacco: Never  Vaping Use   Vaping Use: Never used  Substance and Sexual Activity   Alcohol use: Never    Alcohol/week: 0.0 standard drinks   Drug use: Never   Sexual activity: Not on file  Other Topics Concern   Not on file  Social History Narrative   Not on file   Social Determinants of Health   Financial Resource Strain: Not on file  Food Insecurity: Not on file  Transportation Needs: Not on file  Physical Activity: Not on file  Stress: Not on file  Social Connections: Not on file  Intimate Partner Violence: Not on file      Review of Systems  Constitutional:  Negative for chills, diaphoresis and fatigue.  HENT:  Negative for ear pain, postnasal drip and sinus pressure.   Eyes:  Negative for photophobia, discharge, redness, itching and visual disturbance.  Respiratory:  Negative for cough, shortness of breath and wheezing.   Cardiovascular:  Negative for chest pain, palpitations and leg swelling.  Gastrointestinal:  Negative for abdominal pain, constipation, diarrhea, nausea and vomiting.  Genitourinary:  Negative for dysuria and flank pain.  Musculoskeletal:  Negative for arthralgias, back pain, gait problem and neck pain.  Skin:  Negative for color change.  Allergic/Immunologic: Negative for environmental allergies and food allergies.  Neurological:  Negative for dizziness and headaches.  Hematological:  Does not bruise/bleed easily.  Psychiatric/Behavioral:  Negative for agitation, behavioral problems (depression), hallucinations and sleep disturbance.    Vital Signs: BP (!) 160/90   Pulse 80   Temp 97.8 F (36.6 C)   Resp 16   Ht 5' 2.5" (1.588 m)   Wt 97 lb (44 kg)   SpO2 96%   BMI 17.46 kg/m    Physical  Exam Vitals and nursing note reviewed.  Constitutional:      Appearance: Normal appearance. She is normal weight.  HENT:     Head: Normocephalic and atraumatic.  Eyes:     Pupils: Pupils are equal, round, and reactive to light.  Cardiovascular:     Rate and Rhythm: Normal rate and regular rhythm.     Pulses: Normal pulses.     Heart sounds: Normal heart sounds.  Pulmonary:     Effort: Pulmonary effort is normal.     Breath sounds: Normal breath sounds.  Abdominal:     General: Abdomen is flat.     Palpations: Abdomen is soft.  Musculoskeletal:        General: Normal range of motion.     Cervical back: Normal range of motion.  Skin:    General: Skin is warm.  Neurological:     General: No focal deficit present.     Mental Status: She is alert and oriented to person, place, and time. Mental status is at baseline.  Psychiatric:        Mood and Affect: Mood normal.        Behavior: Behavior normal.        Thought Content: Thought content normal.        Judgment: Judgment normal.       Assessment/Plan: 1. Essential hypertension Elevated in office due to white coat syndrome. Patient's home cuff reported same as manual reading in office today therefore her home readings with better control appear to be accurate. Will have her increase to a total of 5mg  amlodipine daily for better control as she has some borderline readings still. May take 2 tabs of 2.5mg  together or separate. Patient would like to see which she does better with and then may request refills for 2.5mg  BID or 5mg  qd as needed.  2. Ductal carcinoma in situ (DCIS) of left breast Followed by general surgery   General Counseling: Sabrina Zamora verbalizes understanding of the findings of todays visit and agrees with plan of treatment. I have discussed any further diagnostic evaluation that may be needed or ordered today. We also reviewed her medications today. she has been encouraged to call the office with any questions or  concerns that should arise related to todays visit.    No orders of the defined types were placed in this encounter.   No orders of the defined types were placed in this encounter.   This patient was seen by Drema Dallas, PA-C in collaboration with Dr. Clayborn Bigness as a part of collaborative care agreement.   Total time spent:30 Minutes Time spent includes review of chart, medications, test results, and follow up plan with the patient.      Dr Lavera Guise Internal medicine

## 2022-01-01 DIAGNOSIS — Z85828 Personal history of other malignant neoplasm of skin: Secondary | ICD-10-CM | POA: Diagnosis not present

## 2022-01-01 DIAGNOSIS — Z08 Encounter for follow-up examination after completed treatment for malignant neoplasm: Secondary | ICD-10-CM | POA: Diagnosis not present

## 2022-01-01 DIAGNOSIS — Z85820 Personal history of malignant melanoma of skin: Secondary | ICD-10-CM | POA: Diagnosis not present

## 2022-01-01 DIAGNOSIS — L570 Actinic keratosis: Secondary | ICD-10-CM | POA: Diagnosis not present

## 2022-01-01 DIAGNOSIS — Z872 Personal history of diseases of the skin and subcutaneous tissue: Secondary | ICD-10-CM | POA: Diagnosis not present

## 2022-01-06 ENCOUNTER — Other Ambulatory Visit: Payer: Self-pay | Admitting: Physician Assistant

## 2022-01-06 DIAGNOSIS — M810 Age-related osteoporosis without current pathological fracture: Secondary | ICD-10-CM | POA: Diagnosis not present

## 2022-02-15 DIAGNOSIS — Z961 Presence of intraocular lens: Secondary | ICD-10-CM | POA: Diagnosis not present

## 2022-02-15 DIAGNOSIS — H5203 Hypermetropia, bilateral: Secondary | ICD-10-CM | POA: Diagnosis not present

## 2022-02-15 DIAGNOSIS — H524 Presbyopia: Secondary | ICD-10-CM | POA: Diagnosis not present

## 2022-02-15 DIAGNOSIS — H01022 Squamous blepharitis right lower eyelid: Secondary | ICD-10-CM | POA: Diagnosis not present

## 2022-02-15 DIAGNOSIS — H40013 Open angle with borderline findings, low risk, bilateral: Secondary | ICD-10-CM | POA: Diagnosis not present

## 2022-02-15 DIAGNOSIS — H52223 Regular astigmatism, bilateral: Secondary | ICD-10-CM | POA: Diagnosis not present

## 2022-02-15 DIAGNOSIS — D3102 Benign neoplasm of left conjunctiva: Secondary | ICD-10-CM | POA: Diagnosis not present

## 2022-02-15 DIAGNOSIS — H01025 Squamous blepharitis left lower eyelid: Secondary | ICD-10-CM | POA: Diagnosis not present

## 2022-03-03 ENCOUNTER — Inpatient Hospital Stay: Payer: Medicare Other

## 2022-03-03 ENCOUNTER — Inpatient Hospital Stay: Payer: Medicare Other | Attending: Oncology

## 2022-03-03 ENCOUNTER — Encounter: Payer: Self-pay | Admitting: Oncology

## 2022-03-03 ENCOUNTER — Inpatient Hospital Stay (HOSPITAL_BASED_OUTPATIENT_CLINIC_OR_DEPARTMENT_OTHER): Payer: Medicare Other | Admitting: Oncology

## 2022-03-03 VITALS — BP 135/83 | HR 79 | Temp 98.4°F | Resp 18 | Wt 93.6 lb

## 2022-03-03 DIAGNOSIS — Z79810 Long term (current) use of selective estrogen receptor modulators (SERMs): Secondary | ICD-10-CM | POA: Insufficient documentation

## 2022-03-03 DIAGNOSIS — Z170 Estrogen receptor positive status [ER+]: Secondary | ICD-10-CM | POA: Insufficient documentation

## 2022-03-03 DIAGNOSIS — R911 Solitary pulmonary nodule: Secondary | ICD-10-CM | POA: Diagnosis not present

## 2022-03-03 DIAGNOSIS — Z79899 Other long term (current) drug therapy: Secondary | ICD-10-CM | POA: Diagnosis not present

## 2022-03-03 DIAGNOSIS — D0512 Intraductal carcinoma in situ of left breast: Secondary | ICD-10-CM | POA: Diagnosis not present

## 2022-03-03 DIAGNOSIS — D751 Secondary polycythemia: Secondary | ICD-10-CM

## 2022-03-03 DIAGNOSIS — C50912 Malignant neoplasm of unspecified site of left female breast: Secondary | ICD-10-CM

## 2022-03-03 DIAGNOSIS — Z923 Personal history of irradiation: Secondary | ICD-10-CM | POA: Insufficient documentation

## 2022-03-03 LAB — COMPREHENSIVE METABOLIC PANEL
ALT: 17 U/L (ref 0–44)
AST: 24 U/L (ref 15–41)
Albumin: 3.6 g/dL (ref 3.5–5.0)
Alkaline Phosphatase: 36 U/L — ABNORMAL LOW (ref 38–126)
Anion gap: 6 (ref 5–15)
BUN: 20 mg/dL (ref 8–23)
CO2: 31 mmol/L (ref 22–32)
Calcium: 9.6 mg/dL (ref 8.9–10.3)
Chloride: 101 mmol/L (ref 98–111)
Creatinine, Ser: 0.8 mg/dL (ref 0.44–1.00)
GFR, Estimated: >60 mL/min (ref >60)
Glucose, Bld: 109 mg/dL — ABNORMAL HIGH (ref 70–99)
Potassium: 4.4 mmol/L (ref 3.5–5.1)
Sodium: 138 mmol/L (ref 135–145)
Total Bilirubin: 0.7 mg/dL (ref 0.3–1.2)
Total Protein: 6.8 g/dL (ref 6.5–8.1)

## 2022-03-03 LAB — CBC WITH DIFFERENTIAL/PLATELET
Abs Immature Granulocytes: 0.03 10*3/uL (ref 0.00–0.07)
Basophils Absolute: 0.1 10*3/uL (ref 0.0–0.1)
Basophils Relative: 1 %
Eosinophils Absolute: 0.1 10*3/uL (ref 0.0–0.5)
Eosinophils Relative: 1 %
HCT: 47.2 % — ABNORMAL HIGH (ref 36.0–46.0)
Hemoglobin: 15.5 g/dL — ABNORMAL HIGH (ref 12.0–15.0)
Immature Granulocytes: 1 %
Lymphocytes Relative: 29 %
Lymphs Abs: 1.7 10*3/uL (ref 0.7–4.0)
MCH: 31.3 pg (ref 26.0–34.0)
MCHC: 32.8 g/dL (ref 30.0–36.0)
MCV: 95.2 fL (ref 80.0–100.0)
Monocytes Absolute: 0.7 10*3/uL (ref 0.1–1.0)
Monocytes Relative: 11 %
Neutro Abs: 3.4 10*3/uL (ref 1.7–7.7)
Neutrophils Relative %: 57 %
Platelets: 181 10*3/uL (ref 150–400)
RBC: 4.96 MIL/uL (ref 3.87–5.11)
RDW: 13.2 % (ref 11.5–15.5)
WBC: 6 10*3/uL (ref 4.0–10.5)
nRBC: 0 % (ref 0.0–0.2)

## 2022-03-03 MED ORDER — TAMOXIFEN CITRATE 20 MG PO TABS: 20.0000 mg | ORAL_TABLET | Freq: Every day | ORAL | 0 refills | Status: DC

## 2022-03-03 NOTE — Progress Notes (Signed)
[facility] day:  03/03/22  Chief Complaint: secondary polycythemia, lung nodule [presumed lung cancer], left breast DCIS with microinvasive component.  HPI:  Patient previously followed up with Dr. Mike Gip.  Patient switched care to me on 11/29/2018. Extensive chart review was performed.  #Secondary polycythemia Patient has a history of sleep apnea she wears oxygen at night but no CPAP mask.  Never smoker. Work-up on 01/11/2012 reviewed and negative Jak 2 V617F mutation, exon 12 mutation. 08/25/2018 CALR negative, JAK 2 E12-15 negative, MPL negative.  Patient has been on phlebotomy program to keep hematocrit less than 48 and then later to keep it less than 50.  #History of melanoma x3.  T1 a melanoma in August 2012. saw Dermatology and had topical chemotherapy treatment to her forehead lesion.   #She has been compliant with CPAP machine. She did nocturnal oxygen testing and she is not qualified for nocturnal oxygen.   # Lung nodule -presumed lung cancer. She is a never smoker.  Report second hand smoke exposure when she grew up and during the initial few years of her marriage.    Her husband stopped smoking in 1960s.   # 06/26/2020, CT without contrast showed a partial solid nodule within the superior segment of the left lower pole.  This measures 2.4 cm with a solid component measuring 5 mm.  Bronchiectasis with tree-in-bud nodularity is noted within the anterior and posterior basal portions of the right upper lobe.  10/06/2020 CT chest wo contrast - mixed solid and ground-glass nodule of  the anterior superior segment left lower lobe, with tenting of the adjacent fissural pleura. The overall dimensions are approximately 2.8 x 2.4 cm, previously 2.6 x 2.3 cm when measured similarly, with nodular components measuring approximately 0.5 cm and 0.7 cm. Central nodular components appear somewhat more solid than on comparison prior although are not  substantially changed in size a new 5 mm pulmonary nodule of the lateral segment right middle lobe, nonspecific.Stable 4 mm nodule of the anterior right lower lobe, which remains nonspecific.4. There are numerous additional unchanged clustered and tree-in-bud centrilobular nodules, particularly in the posterior right upper lobe. 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  Patient's case was discussed on tumor board on 10/15/2020.  Consensus reached about proceed with biopsy versus PET scan+ empiric SBRT given her age. 10/28/2020, PET scan did not show activity of the left lower lobe nodule.  However her tumor board discussion, CT appearance is indicated for of lung cancer.  Patient was referred to establish care with Dr. Baruch Gouty radiation oncology and was evaluated on 11/03/2020.  RadOnc recommend watchful waiting for the time being.  Should the nodule continues to solidify and grow patient will be offered empirically SBRT at that time.  05/04/2021, CT chest showed continued slow growth of left lower lobe lung lesion. 06/23/2021, patient finished SBRT to left lower lung lesion.   Left breast DCIS with microinvasive component 09/25/20 screening mammogram showed possible distortion and calcifications in the left breast.  10/08/2020 left diagnostic mammogram showed Grouped microcalcifications in the left breast, Four circumscribed hypoechoic masses in the left breast may represent complicated cysts and are probably benign.  10/14/2020 left breast upper outer quadrant at middle depth-- intermediate grade DCIS, 32mm, with microcalcifications.  11/25/2020, patient underwent left lumpectomy by Dr. Dahlia Byes There is microinvasive mammary carcinoma with high-grade DCIS.  Negative margins.  pTmi pNx Given her advanced age, multiple other medical conditions, strongly ER positivity, informed decision  was made to omit sentinel lymph node biopsy. Case was discussed on breast cancer  tumor board.  01/14/2021 finished radiation.  02/03/2021 started on tamoxifen  INTERVAL HISTORY Sabrina Zamora is a 88 y.o. female who has above history reviewed by me today presents for follow up visit for erythrocytosis, lung nodule, left breast DCIS with microinvasive component During the interval, patient finished SBRT to left lower lobe lesion. Patient tolerates tamoxifen well.  No new breast concerns. Compliant with CPAP machine, except as recently due to skin lesion removal and topical treatments on her face, she is not currently using CPAP machine.  Takes aspirin 81 mg   .    Review of Systems  Constitutional:  Negative for appetite change, chills, fatigue and fever.  HENT:   Negative for hearing loss and voice change.   Eyes:  Negative for eye problems.  Respiratory:  Negative for chest tightness and cough.   Cardiovascular:  Negative for chest pain.  Gastrointestinal:  Negative for abdominal distention, abdominal pain and blood in stool.  Endocrine: Negative for hot flashes.  Genitourinary:  Negative for difficulty urinating and frequency.   Musculoskeletal:  Negative for arthralgias.  Skin:  Negative for itching and rash.  Neurological:  Negative for extremity weakness.  Hematological:  Negative for adenopathy.  Psychiatric/Behavioral:  Negative for confusion.     Past Medical History:  Diagnosis Date   Asthma    GERD (gastroesophageal reflux disease)    High cholesterol    Hypertension    Hypothyroidism    Melanoma ([place]) 1986, 2008, 2016, 2021   melenoma left arm, left leg, back, back respectively   Osteoporosis    Polycythemia vera ([place])    Skin cancer 2012, 2016   Sleep apnea     Past Surgical History:  Procedure Laterality Date   BREAST BIOPSY Left 2003   neg   BREAST BIOPSY Right 09/27/2017   12:00 1 cmfn fibroadenomatous change    BREAST BIOPSY Right 09/17/2017   12:00 3 cmfn benign breast and fibroadipose tissue   BREAST BIOPSY Left 10/14/2020    stereo biopsy, ribbon clip/ INTERMEDIATE GRADE DUCTAL CARCINOMA IN SITU    BREAST EXCISIONAL BIOPSY Left 1988   excisional - neg   BREAST LUMPECTOMY Left 11/25/2020   BREAST LUMPECTOMY WITH RADIOFREQUENCY TAG IDENTIFICATION Left 73/71/0626   Procedure: BREAST LUMPECTOMY WITH RADIOFREQUENCY TAG IDENTIFICATION;  Surgeon: Jules Husbands, MD;  Location: [facility];  Service: General;  Laterality: Left;   CATARACT EXTRACTION W/PHACO Left 09/20/2017   Procedure: CATARACT EXTRACTION PHACO AND INTRAOCULAR LENS PLACEMENT ([place]) LEFT;  Surgeon: Leandrew Koyanagi, MD;  Location: [place];  Service: Ophthalmology;  Laterality: Left;   CATARACT EXTRACTION W/PHACO Right 02/15/2018   Procedure: CATARACT EXTRACTION PHACO AND INTRAOCULAR LENS PLACEMENT ([place]) TOPICAL  RIGHT;  Surgeon: Leandrew Koyanagi, MD;  Location: [place];  Service: Ophthalmology;  Laterality: Right;  prefers early   CHOLECYSTECTOMY     EYE SURGERY      Family History  Problem Relation Age of Onset   Clotting disorder Sister    Breast cancer Sister 102   Heart disease Mother    Heart disease Father     Social History:  reports that she has never smoked. She has never used smokeless tobacco. She reports that she does not drink alcohol and does not use drugs.  She has never smoked.  The patient is alone today.  Allergies:  Allergies  Allergen Reactions   Levofloxacin Rash and Shortness Of  Breath   Penicillins Hives and Rash   Sulfa Antibiotics Rash   Tobramycin Other (See Comments)    Unknown   Betadine [Povidone Iodine] Rash   Ciprofloxacin Itching and Rash    Current Medications: Current Outpatient Medications  Medication Sig Dispense Refill   ADVAIR DISKUS 250-50 MCG/ACT AEPB Inhale 1 puff into the lungs in the morning and at bedtime. 3 each 3   amLODipine (NORVASC) 2.5 MG tablet TAKE 1 TABLET BY MOUTH ONCE DAILY 45 tablet 3   aspirin EC 81 MG tablet Take 81 mg by mouth 3 (three) times a week.      Cholecalciferol (VITAMIN D) 50 MCG (2000 UT) CAPS Take 2,000 Units by mouth daily.     denosumab (PROLIA) 60 MG/ML SOSY injection Inject 60 mg into the skin every 6 (six) months.     levothyroxine (SYNTHROID) 25 MCG tablet TAKE ONE AND ONE-HALF TABLETS DAILY BEFORE BREAKFAST 135 tablet 3   lovastatin (MEVACOR) 20 MG tablet TAKE 1 TABLET BY MOUTH NIGHTLY 90 tablet 2   Multiple Vitamin (MULTI-VITAMINS) TABS Take 1 tablet by mouth daily.     mupirocin nasal ointment (BACTROBAN) 2 % Place 1 application into the nose at bedtime. 10 g 1   Zinc 25 MG TABS Take 25 mg by mouth daily.     tamoxifen (NOLVADEX) 20 MG tablet Take 1 tablet (20 mg total) by mouth daily. 120 tablet 0   No current facility-administered medications for this visit.    Physical Exam: Blood pressure 135/83, pulse 79, temperature 98.4 F (36.9 C), resp. rate 18, weight 93 lb 9.6 oz (42.5 kg).  Physical Exam Constitutional:      General: She is not in acute distress.    Appearance: Normal appearance. She is not diaphoretic.     Comments: Thin built, walks independently.   HENT:     Head: Normocephalic and atraumatic.     Nose: Nose normal.     Mouth/Throat:     Pharynx: No oropharyngeal exudate.  Eyes:     General: No scleral icterus.    Pupils: Pupils are equal, round, and reactive to light.  Cardiovascular:     Rate and Rhythm: Normal rate and regular rhythm.     Heart sounds: No murmur heard. Pulmonary:     Effort: Pulmonary effort is normal. No respiratory distress.  Abdominal:     Palpations: Abdomen is soft.  Musculoskeletal:        General: Normal range of motion.     Cervical back: Normal range of motion and neck supple.  Skin:    General: Skin is dry.     Findings: No rash.  Neurological:     Mental Status: She is alert and oriented to person, place, and time. Mental status is at baseline.     Cranial Nerves: Cranial nerve deficit present.     Motor: No abnormal muscle tone.  Psychiatric:         Mood and Affect: Mood and affect normal.     Appointment on 03/03/2022  Component Date Value Ref Range Status   WBC 03/03/2022 6.0  4.0 - 10.5 K/uL Final   RBC 03/03/2022 4.96  3.87 - 5.11 MIL/uL Final   Hemoglobin 03/03/2022 15.5 (H)  12.0 - 15.0 g/dL Final   HCT 03/03/2022 47.2 (H)  36.0 - 46.0 % Final   MCV 03/03/2022 95.2  80.0 - 100.0 fL Final   MCH 03/03/2022 31.3  26.0 - 34.0 pg Final   MCHC 03/03/2022  32.8  30.0 - 36.0 g/dL Final   RDW 03/03/2022 13.2  11.5 - 15.5 % Final   Platelets 03/03/2022 181  150 - 400 K/uL Final   nRBC 03/03/2022 0.0  0.0 - 0.2 % Final   Neutrophils Relative % 03/03/2022 57  % Final   Neutro Abs 03/03/2022 3.4  1.7 - 7.7 K/uL Final   Lymphocytes Relative 03/03/2022 29  % Final   Lymphs Abs 03/03/2022 1.7  0.7 - 4.0 K/uL Final   Monocytes Relative 03/03/2022 11  % Final   Monocytes Absolute 03/03/2022 0.7  0.1 - 1.0 K/uL Final   Eosinophils Relative 03/03/2022 1  % Final   Eosinophils Absolute 03/03/2022 0.1  0.0 - 0.5 K/uL Final   Basophils Relative 03/03/2022 1  % Final   Basophils Absolute 03/03/2022 0.1  0.0 - 0.1 K/uL Final   Immature Granulocytes 03/03/2022 1  % Final   Abs Immature Granulocytes 03/03/2022 0.03  0.00 - 0.07 K/uL Final   Performed at [facility], [place], [place] [address]   Sodium 03/03/2022 138  135 - 145 mmol/L Final   Potassium 03/03/2022 4.4  3.5 - 5.1 mmol/L Final   Chloride 03/03/2022 101  98 - 111 mmol/L Final   CO2 03/03/2022 31  22 - 32 mmol/L Final   Glucose, Bld 03/03/2022 109 (H)  70 - 99 mg/dL Final   Glucose reference range applies only to samples taken after fasting for at least 8 hours.   BUN 03/03/2022 20  8 - 23 mg/dL Final   Creatinine, Ser 03/03/2022 0.80  0.44 - 1.00 mg/dL Final   Calcium 03/03/2022 9.6  8.9 - 10.3 mg/dL Final   Total Protein 03/03/2022 6.8  6.5 - 8.1 g/dL Final   Albumin 03/03/2022 3.6  3.5 - 5.0 g/dL Final   AST 03/03/2022 24  15 - 41 U/L Final   ALT  03/03/2022 17  0 - 44 U/L Final   Alkaline Phosphatase 03/03/2022 36 (L)  38 - 126 U/L Final   Total Bilirubin 03/03/2022 0.7  0.3 - 1.2 mg/dL Final   GFR, Estimated 03/03/2022 >60  >60 mL/min Final   Comment: (NOTE) Calculated using the CKD-EPI Creatinine Equation (2021)    Anion gap 03/03/2022 6  5 - 15 Final   Performed at [facility], [address], [place], [place] [address]    Assessment:  Sabrina Zamora is a 88 y.o. female present for follow up of erythrocytosis.  1. Ductal carcinoma in situ (DCIS) of left breast with microinvasive component (HCC)   2. Long-term current use of tamoxifen   3. Secondary erythrocytosis   4. Lung nodule    #Lung nodule found on chest x-ray and confirmed on CT scan, no FDG activity on PET scan. Patient is status post SBRT finished in December 2023 10/30/2021, CT chest with contrast showed radiation changes.  Small left pleural effusion. Future surveillance CT scan ordered reviewed radiation oncologist office.   #Left breast DCIS with microinvasive component, pTMi pN0 Status post lumpectomy.  No sentinel lymph node biopsy was done. S/p RT Labs reviewed and discussed with patient Continue tamoxifen 20 mg daily.  Plan 5 years-- end August 2027 Continue asprin 81mg  daily for thrombosis prophylaxis.  annual bilateral diagnostic mammogram in March 2023 reviewed and discussed with patient.. I recommend annual pelvic examination while on tamoxifen.  Refer patient establish care with gynecologist.  # history of osteoprosis, history of right foot fracture, on prolia - not  managed by me  # erythrocytosis, presumed to be secondary to sleep apnea.  No need for phlebotomy today.     Follow up plan 6 months lab/MD+/- Phlebotomy (cbc cmp)   We spent sufficient time to discuss many aspect of care, questions were answered to patient's satisfaction.   Earlie Server, MD  03/03/2022, 11:32 PM

## 2022-03-24 DIAGNOSIS — M810 Age-related osteoporosis without current pathological fracture: Secondary | ICD-10-CM | POA: Diagnosis not present

## 2022-03-25 DIAGNOSIS — M810 Age-related osteoporosis without current pathological fracture: Secondary | ICD-10-CM | POA: Diagnosis not present

## 2022-04-05 DIAGNOSIS — D2272 Melanocytic nevi of left lower limb, including hip: Secondary | ICD-10-CM | POA: Diagnosis not present

## 2022-04-05 DIAGNOSIS — D2271 Melanocytic nevi of right lower limb, including hip: Secondary | ICD-10-CM | POA: Diagnosis not present

## 2022-04-05 DIAGNOSIS — D2262 Melanocytic nevi of left upper limb, including shoulder: Secondary | ICD-10-CM | POA: Diagnosis not present

## 2022-04-05 DIAGNOSIS — Z85820 Personal history of malignant melanoma of skin: Secondary | ICD-10-CM | POA: Diagnosis not present

## 2022-04-05 DIAGNOSIS — Z85828 Personal history of other malignant neoplasm of skin: Secondary | ICD-10-CM | POA: Diagnosis not present

## 2022-04-05 DIAGNOSIS — L570 Actinic keratosis: Secondary | ICD-10-CM | POA: Diagnosis not present

## 2022-04-06 DIAGNOSIS — R636 Underweight: Secondary | ICD-10-CM | POA: Diagnosis not present

## 2022-04-06 DIAGNOSIS — M810 Age-related osteoporosis without current pathological fracture: Secondary | ICD-10-CM | POA: Diagnosis not present

## 2022-04-06 IMAGING — MG MM BREAST SURGICAL SPECIMEN
1 series · 1 of 1 positions shown · non-contrast
Comparison: Previous exam(s).

CLINICAL DATA: Patient status post left breast lumpectomy.

EXAM:
SPECIMEN RADIOGRAPH OF THE LEFT BREAST

[L]
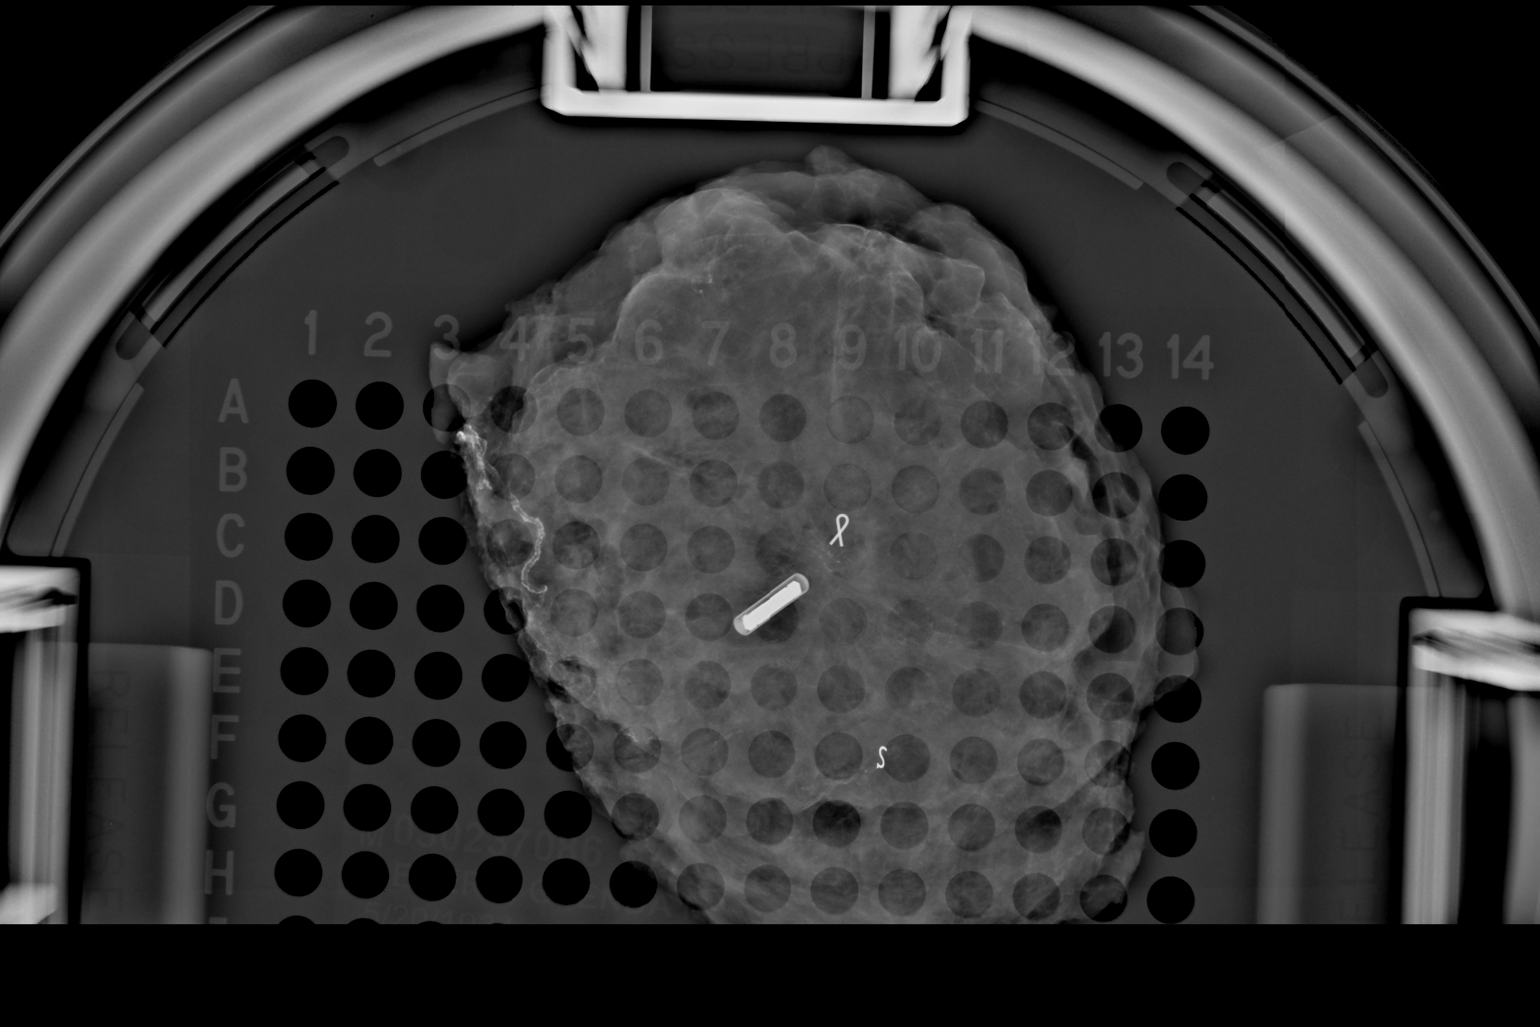

[1 of 1 positions shown; findings below may reference images not displayed]

FINDINGS: Status post excision of the left breast. The tag and biopsy marker
clip are present, completely intact, and were marked for pathology.
IMPRESSION: Specimen radiograph of the left breast.

## 2022-04-16 DIAGNOSIS — Z23 Encounter for immunization: Secondary | ICD-10-CM | POA: Diagnosis not present

## 2022-04-23 DIAGNOSIS — D3102 Benign neoplasm of left conjunctiva: Secondary | ICD-10-CM | POA: Diagnosis not present

## 2022-04-23 DIAGNOSIS — H01022 Squamous blepharitis right lower eyelid: Secondary | ICD-10-CM | POA: Diagnosis not present

## 2022-04-23 DIAGNOSIS — H01025 Squamous blepharitis left lower eyelid: Secondary | ICD-10-CM | POA: Diagnosis not present

## 2022-04-23 DIAGNOSIS — H52223 Regular astigmatism, bilateral: Secondary | ICD-10-CM | POA: Diagnosis not present

## 2022-04-23 DIAGNOSIS — Z961 Presence of intraocular lens: Secondary | ICD-10-CM | POA: Diagnosis not present

## 2022-04-23 DIAGNOSIS — H40013 Open angle with borderline findings, low risk, bilateral: Secondary | ICD-10-CM | POA: Diagnosis not present

## 2022-04-23 DIAGNOSIS — H524 Presbyopia: Secondary | ICD-10-CM | POA: Diagnosis not present

## 2022-04-23 DIAGNOSIS — H5203 Hypermetropia, bilateral: Secondary | ICD-10-CM | POA: Diagnosis not present

## 2022-05-11 ENCOUNTER — Ambulatory Visit
Admission: RE | Admit: 2022-05-11 | Discharge: 2022-05-11 | Disposition: A | Discharge status: Home / Self Care | Payer: Medicare Other | Source: Ambulatory Visit | Attending: Radiation Oncology | Admitting: Radiation Oncology

## 2022-05-11 DIAGNOSIS — R918 Other nonspecific abnormal finding of lung field: Secondary | ICD-10-CM | POA: Insufficient documentation

## 2022-05-11 DIAGNOSIS — C3490 Malignant neoplasm of unspecified part of unspecified bronchus or lung: Secondary | ICD-10-CM | POA: Diagnosis not present

## 2022-05-11 DIAGNOSIS — J479 Bronchiectasis, uncomplicated: Secondary | ICD-10-CM | POA: Diagnosis not present

## 2022-05-13 ENCOUNTER — Encounter: Payer: Self-pay | Admitting: Physician Assistant

## 2022-05-13 ENCOUNTER — Ambulatory Visit (INDEPENDENT_AMBULATORY_CARE_PROVIDER_SITE_OTHER): Payer: Medicare Other | Admitting: Physician Assistant

## 2022-05-13 VITALS — BP 154/82 | HR 80 | Temp 98.1°F | Resp 16 | Ht 62.5 in | Wt 102.0 lb

## 2022-05-13 DIAGNOSIS — R5383 Other fatigue: Secondary | ICD-10-CM

## 2022-05-13 DIAGNOSIS — E782 Mixed hyperlipidemia: Secondary | ICD-10-CM | POA: Diagnosis not present

## 2022-05-13 DIAGNOSIS — Z0001 Encounter for general adult medical examination with abnormal findings: Secondary | ICD-10-CM | POA: Diagnosis not present

## 2022-05-13 DIAGNOSIS — E559 Vitamin D deficiency, unspecified: Secondary | ICD-10-CM

## 2022-05-13 DIAGNOSIS — E039 Hypothyroidism, unspecified: Secondary | ICD-10-CM

## 2022-05-13 DIAGNOSIS — I10 Essential (primary) hypertension: Secondary | ICD-10-CM

## 2022-05-13 MED ORDER — ADVAIR DISKUS 250-50 MCG/ACT IN AEPB: 1.0000 | INHALATION_SPRAY | Freq: Two times a day (BID) | RESPIRATORY_TRACT | 3 refills | Status: DC

## 2022-05-13 NOTE — Progress Notes (Signed)
[facility], [place] [address]  Internal MEDICINE  Office Visit Note  Patient Name: Sabrina Zamora  [id number]  [id number]  Date of Service: 05/13/2022  Chief Complaint  Patient presents with   Medicare Wellness   Gastroesophageal Reflux   Hypertension   Hyperlipidemia     HPI Pt is here for routine health maintenance examination -Bp at home has been stable, always elevated in offices-white coat syndrome -She states that her daughters live out of state and are trying to convince her to move to an assisted living near them. She is still weighing her options at this time as she has lvied in her home 50 years and has been managing ok. She does have the brochure and info for the facility and states she may bring paperwork required for assisted living in future if and when she decides to move forward with it. -weight still low but is rising. She is trying to eat more calories and reports having a good appetite. She reports her endocrinologist has also spoken with her about increasing weight. She is up 5lbs since my last visit with her. -Declines breast exam today since she is already followed by oncology/gen surgery for her breast cancer -She also declines a urine sample today since she has a urologist and is following their instructions and hasn't had any problems. Reports if any problems she would call their office. -Due for routine fasting labs. CBC already followed by Dr. Tasia Catchings, but will order remaining labs -would like copy of lab results mailed when they come back  Current Medication: Outpatient Encounter Medications as of 05/13/2022  Medication Sig   amLODipine (NORVASC) 2.5 MG tablet TAKE 1 TABLET BY MOUTH ONCE DAILY   aspirin EC 81 MG tablet Take 81 mg by mouth 3 (three) times a week.   Cholecalciferol (VITAMIN D) 50 MCG (2000 UT) CAPS Take 2,000 Units by mouth daily.   denosumab (PROLIA) 60 MG/ML SOSY injection Inject 60 mg into the skin every 6  (six) months.   levothyroxine (SYNTHROID) 25 MCG tablet TAKE ONE AND ONE-HALF TABLETS DAILY BEFORE BREAKFAST   lovastatin (MEVACOR) 20 MG tablet TAKE 1 TABLET BY MOUTH NIGHTLY   Multiple Vitamin (MULTI-VITAMINS) TABS Take 1 tablet by mouth daily.   mupirocin nasal ointment (BACTROBAN) 2 % Place 1 application into the nose at bedtime.   tamoxifen (NOLVADEX) 20 MG tablet Take 1 tablet (20 mg total) by mouth daily.   Zinc 25 MG TABS Take 25 mg by mouth daily.   [DISCONTINUED] ADVAIR DISKUS 250-50 MCG/ACT AEPB Inhale 1 puff into the lungs in the morning and at bedtime.   ADVAIR DISKUS 250-50 MCG/ACT AEPB Inhale 1 puff into the lungs in the morning and at bedtime.   No facility-administered encounter medications on file as of 05/13/2022.    Surgical History: Past Surgical History:  Procedure Laterality Date   BREAST BIOPSY Left 2003   neg   BREAST BIOPSY Right 09/27/2017   12:00 1 cmfn fibroadenomatous change    BREAST BIOPSY Right 09/17/2017   12:00 3 cmfn benign breast and fibroadipose tissue   BREAST BIOPSY Left 10/14/2020   stereo biopsy, ribbon clip/ INTERMEDIATE GRADE DUCTAL CARCINOMA IN SITU    BREAST EXCISIONAL BIOPSY Left 1988   excisional - neg   BREAST LUMPECTOMY Left 11/25/2020   BREAST LUMPECTOMY WITH RADIOFREQUENCY TAG IDENTIFICATION Left 23/55/7322   Procedure: BREAST LUMPECTOMY WITH RADIOFREQUENCY TAG IDENTIFICATION;  Surgeon: Jules Husbands, MD;  Location: [facility];  Service: General;  Laterality: Left;   CATARACT EXTRACTION W/PHACO Left 09/20/2017   Procedure: CATARACT EXTRACTION PHACO AND INTRAOCULAR LENS PLACEMENT ([place]) LEFT;  Surgeon: Leandrew Koyanagi, MD;  Location: [place];  Service: Ophthalmology;  Laterality: Left;   CATARACT EXTRACTION W/PHACO Right 02/15/2018   Procedure: CATARACT EXTRACTION PHACO AND INTRAOCULAR LENS PLACEMENT ([place]) TOPICAL  RIGHT;  Surgeon: Leandrew Koyanagi, MD;  Location: [place];  Service: Ophthalmology;   Laterality: Right;  prefers early   CHOLECYSTECTOMY     EYE SURGERY      Medical History: Past Medical History:  Diagnosis Date   Asthma    GERD (gastroesophageal reflux disease)    High cholesterol    Hypertension    Hypothyroidism    Melanoma ([place]) 1986, 2008, 2016, 2021   melenoma left arm, left leg, back, back respectively   Osteoporosis    Polycythemia vera ([place])    Skin cancer 2012, 2016   Sleep apnea     Family History: Family History  Problem Relation Age of Onset   Clotting disorder Sister    Breast cancer Sister 12   Heart disease Mother    Heart disease Father       Review of Systems  Constitutional:  Negative for chills, diaphoresis and fatigue.  HENT:  Negative for ear pain, postnasal drip and sinus pressure.   Eyes:  Negative for photophobia, discharge, redness, itching and visual disturbance.  Respiratory:  Negative for cough, shortness of breath and wheezing.   Cardiovascular:  Negative for chest pain, palpitations and leg swelling.  Gastrointestinal:  Negative for abdominal pain, constipation, diarrhea, nausea and vomiting.  Genitourinary:  Negative for dysuria and flank pain.  Musculoskeletal:  Negative for arthralgias, back pain, gait problem and neck pain.  Skin:  Negative for color change.  Allergic/Immunologic: Negative for environmental allergies and food allergies.  Neurological:  Negative for dizziness and headaches.  Hematological:  Does not bruise/bleed easily.  Psychiatric/Behavioral:  Negative for agitation, behavioral problems (depression), hallucinations and sleep disturbance.      Vital Signs: BP (!) 154/82 Comment: 162/104  Pulse 80   Temp 98.1 F (36.7 C)   Resp 16   Ht 5' 2.5" (1.588 m)   Wt 102 lb (46.3 kg)   SpO2 96%   BMI 18.36 kg/m    Physical Exam Vitals and nursing note reviewed.  Constitutional:      Appearance: Normal appearance. She is normal weight.  HENT:     Head: Normocephalic and atraumatic.  Eyes:      Pupils: Pupils are equal, round, and reactive to light.  Cardiovascular:     Rate and Rhythm: Normal rate and regular rhythm.     Pulses: Normal pulses.     Heart sounds: Normal heart sounds.  Pulmonary:     Effort: Pulmonary effort is normal.     Breath sounds: Normal breath sounds.  Abdominal:     General: Abdomen is flat.     Palpations: Abdomen is soft.     Tenderness: There is no abdominal tenderness.  Musculoskeletal:        General: Normal range of motion.     Cervical back: Normal range of motion.     Right lower leg: No edema.     Left lower leg: No edema.  Skin:    General: Skin is warm.  Neurological:     General: No focal deficit present.     Mental Status: She is alert and oriented to person, place, and  time. Mental status is at baseline.  Psychiatric:        Mood and Affect: Mood normal.        Behavior: Behavior normal.        Thought Content: Thought content normal.        Judgment: Judgment normal.      LABS: Recent Results (from the past 2160 hour(s))  CBC with Differential/Platelet     Status: Abnormal   Collection Time: 03/03/22 12:58 PM  Result Value Ref Range   WBC 6.0 4.0 - 10.5 K/uL   RBC 4.96 3.87 - 5.11 MIL/uL   Hemoglobin 15.5 (H) 12.0 - 15.0 g/dL   HCT 47.2 (H) 36.0 - 46.0 %   MCV 95.2 80.0 - 100.0 fL   MCH 31.3 26.0 - 34.0 pg   MCHC 32.8 30.0 - 36.0 g/dL   RDW 13.2 11.5 - 15.5 %   Platelets 181 150 - 400 K/uL   nRBC 0.0 0.0 - 0.2 %   Neutrophils Relative % 57 %   Neutro Abs 3.4 1.7 - 7.7 K/uL   Lymphocytes Relative 29 %   Lymphs Abs 1.7 0.7 - 4.0 K/uL   Monocytes Relative 11 %   Monocytes Absolute 0.7 0.1 - 1.0 K/uL   Eosinophils Relative 1 %   Eosinophils Absolute 0.1 0.0 - 0.5 K/uL   Basophils Relative 1 %   Basophils Absolute 0.1 0.0 - 0.1 K/uL   Immature Granulocytes 1 %   Abs Immature Granulocytes 0.03 0.00 - 0.07 K/uL    Comment: Performed at [facility], [address] [place], [place] [address]  Comprehensive  metabolic panel     Status: Abnormal   Collection Time: 03/03/22 12:58 PM  Result Value Ref Range   Sodium 138 135 - 145 mmol/L   Potassium 4.4 3.5 - 5.1 mmol/L   Chloride 101 98 - 111 mmol/L   CO2 31 22 - 32 mmol/L   Glucose, Bld 109 (H) 70 - 99 mg/dL    Comment: Glucose reference range applies only to samples taken after fasting for at least 8 hours.   BUN 20 8 - 23 mg/dL   Creatinine, Ser 0.80 0.44 - 1.00 mg/dL   Calcium 9.6 8.9 - 10.3 mg/dL   Total Protein 6.8 6.5 - 8.1 g/dL   Albumin 3.6 3.5 - 5.0 g/dL   AST 24 15 - 41 U/L   ALT 17 0 - 44 U/L   Alkaline Phosphatase 36 (L) 38 - 126 U/L   Total Bilirubin 0.7 0.3 - 1.2 mg/dL   GFR, Estimated >60 >60 mL/min    Comment: (NOTE) Calculated using the CKD-EPI Creatinine Equation (2021)    Anion gap 6 5 - 15    Comment: Performed at [facility], [address], [place], [place] [address]        Assessment/Plan: 1. Encounter for general adult medical examination with abnormal findings CPE performed, routine labs ordered, she will consider if she desires further vaccines recommended - Lipid Panel With LDL/HDL Ratio - TSH + free T4 - Vitamin D (25 hydroxy) - Comprehensive metabolic panel  2. White coat syndrome with diagnosis of hypertension Elevated in office, but stable at home. Will continue current medication and monitoring  3. Mixed hyperlipidemia - Lipid Panel With LDL/HDL Ratio  4. Vitamin D deficiency - Vitamin D (25 hydroxy)  5. Acquired hypothyroidism Continue synthroid and will update labs - TSH + free T4  6. Other fatigue - Lipid Panel With LDL/HDL  Ratio - TSH + free T4 - Vitamin D (25 hydroxy) - Comprehensive metabolic panel   General Counseling: Sabrina Zamora verbalizes understanding of the findings of todays visit and agrees with plan of treatment. I have discussed any further diagnostic evaluation that may be needed or ordered today. We also reviewed her medications today. she has been encouraged to  call the office with any questions or concerns that should arise related to todays visit.    Counseling:    Orders Placed This Encounter  Procedures   Lipid Panel With LDL/HDL Ratio   TSH + free T4   Vitamin D (25 hydroxy)   Comprehensive metabolic panel    Meds ordered this encounter  Medications   ADVAIR DISKUS 250-50 MCG/ACT AEPB    Sig: Inhale 1 puff into the lungs in the morning and at bedtime.    Dispense:  3 each    Refill:  3    Pt will call when refill needed    This patient was seen by Drema Dallas, PA-C in collaboration with Dr. Clayborn Bigness as a part of collaborative care agreement.  Total time spent:35 Minutes  Time spent includes review of chart, medications, test results, and follow up plan with the patient.     Lavera Guise, MD  Internal Medicine

## 2022-05-14 ENCOUNTER — Other Ambulatory Visit: Payer: Self-pay

## 2022-05-14 DIAGNOSIS — E039 Hypothyroidism, unspecified: Secondary | ICD-10-CM

## 2022-05-14 DIAGNOSIS — R5383 Other fatigue: Secondary | ICD-10-CM | POA: Diagnosis not present

## 2022-05-14 DIAGNOSIS — E783 Hyperchylomicronemia: Secondary | ICD-10-CM

## 2022-05-15 LAB — LIPID PANEL WITH LDL/HDL RATIO
Cholesterol, Total: 176 mg/dL (ref 100–199)
HDL: 64 mg/dL (ref >39)
LDL Chol Calc (NIH): 91 mg/dL (ref 0–99)
LDL/HDL Ratio: 1.4 ratio (ref 0.0–3.2)
Triglycerides: 117 mg/dL (ref 0–149)
VLDL Cholesterol Cal: 21 mg/dL (ref 5–40)

## 2022-05-15 LAB — COMPREHENSIVE METABOLIC PANEL
ALT: 20 IU/L (ref 0–32)
AST: 27 IU/L (ref 0–40)
Albumin/Globulin Ratio: 1.4 (ref 1.2–2.2)
Albumin: 4.2 g/dL (ref 3.7–4.7)
Alkaline Phosphatase: 48 IU/L (ref 44–121)
BUN/Creatinine Ratio: 18 (ref 12–28)
BUN: 15 mg/dL (ref 8–27)
Bilirubin Total: 0.5 mg/dL (ref 0.0–1.2)
CO2: 27 mmol/L (ref 20–29)
Calcium: 10.1 mg/dL (ref 8.7–10.3)
Chloride: 100 mmol/L (ref 96–106)
Creatinine, Ser: 0.85 mg/dL (ref 0.57–1.00)
Globulin, Total: 3.1 g/dL (ref 1.5–4.5)
Glucose: 108 mg/dL — ABNORMAL HIGH (ref 70–99)
Potassium: 5.4 mmol/L — ABNORMAL HIGH (ref 3.5–5.2)
Sodium: 140 mmol/L (ref 134–144)
Total Protein: 7.3 g/dL (ref 6.0–8.5)
eGFR: 65 mL/min/{1.73_m2} (ref >59)

## 2022-05-15 LAB — TSH+FREE T4
Free T4: 1.36 ng/dL (ref 0.82–1.77)
TSH: 4.88 u[IU]/mL — ABNORMAL HIGH (ref 0.450–4.500)

## 2022-05-18 ENCOUNTER — Telehealth: Payer: Self-pay

## 2022-05-18 ENCOUNTER — Ambulatory Visit: Payer: Medicare Other | Admitting: Radiation Oncology

## 2022-05-18 NOTE — Telephone Encounter (Signed)
Pt advised for labs and mailed copy of labs result

## 2022-05-18 NOTE — Telephone Encounter (Signed)
-----   Message from Mylinda Latina, PA-C sent at 05/18/2022 10:20 AM EST ----- Please let patient know that her potassium is elevated and to stop any potasium supplements. Her Tsh is also up slightly but free t4 still well in normal range therefore will keep monitoring, cholesterol looked good. Her glucose is also slightly elevated. Pt also requested that a copy of her labs be mailed to her so please send these for her.

## 2022-05-26 ENCOUNTER — Other Ambulatory Visit: Payer: Self-pay | Admitting: *Deleted

## 2022-05-26 ENCOUNTER — Encounter: Payer: Self-pay | Admitting: Radiation Oncology

## 2022-05-26 ENCOUNTER — Ambulatory Visit
Admission: RE | Admit: 2022-05-26 | Discharge: 2022-05-26 | Disposition: A | Discharge status: Home / Self Care | Payer: Medicare Other | Source: Ambulatory Visit | Attending: Radiation Oncology | Admitting: Radiation Oncology

## 2022-05-26 VITALS — BP 166/86 | HR 79 | Temp 97.4°F | Resp 14 | Ht 62.5 in | Wt 100.0 lb

## 2022-05-26 DIAGNOSIS — Z923 Personal history of irradiation: Secondary | ICD-10-CM | POA: Diagnosis not present

## 2022-05-26 DIAGNOSIS — R918 Other nonspecific abnormal finding of lung field: Secondary | ICD-10-CM | POA: Diagnosis not present

## 2022-05-26 DIAGNOSIS — R911 Solitary pulmonary nodule: Secondary | ICD-10-CM | POA: Diagnosis not present

## 2022-05-26 DIAGNOSIS — Z170 Estrogen receptor positive status [ER+]: Secondary | ICD-10-CM

## 2022-05-26 NOTE — Progress Notes (Signed)
Radiation Oncology Follow up Note  Name: Sabrina Zamora   Date:   05/26/2022 MRN:  [id number] DOB: 1931/05/24    This 88 y.o. female presents to the clinic today for 14-month follow-up status post SBRT to her left lower lobe for stage I non-small cell lung cancer and patient previous treated back in 22 for DCIS.  REFERRING PROVIDER: Mylinda Latina, PA*  HPI: Patient is a 88 year old female now out 10 months having completed SBRT to her left lower lobe presumed stage I non-small cell lung cancer.  Seen today in routine follow-up she is doing well she is asymptomatic specifically denies cough hemoptysis or any change in her pulmonary status..  Recent CT scan showed near complete resolution of pleural fluid seen in the left chest as well as radiation changes in the left mid chest extending from left hilum with no new or progressive findings.  COMPLICATIONS OF TREATMENT: none  FOLLOW UP COMPLIANCE: keeps appointments   PHYSICAL EXAM:  BP (!) 166/86   Pulse 79   Temp (!) 97.4 F (36.3 C)   Resp 14   Ht 5' 2.5" (1.588 m)   Wt 100 lb (45.4 kg)   BMI 18.00 kg/m  Well-developed well-nourished patient in NAD. HEENT reveals PERLA, EOMI, discs not visualized.  Oral cavity is clear. No oral mucosal lesions are identified. Neck is clear without evidence of cervical or supraclavicular adenopathy. Lungs are clear to A&P. Cardiac examination is essentially unremarkable with regular rate and rhythm without murmur rub or thrill. Abdomen is benign with no organomegaly or masses noted. Motor sensory and DTR levels are equal and symmetric in the upper and lower extremities. Cranial nerves II through XII are grossly intact. Proprioception is intact. No peripheral adenopathy or edema is identified. No motor or sensory levels are noted. Crude visual fields are within normal range.  RADIOLOGY RESULTS: CT scans reviewed compatible with above-stated findings  PLAN: Present time patient is doing well  excellent response to SBRT treatment.  She her side effect profile is extremely low.  I have asked to see her back in 6 months with a follow-up CT scan at that time.  Patient is to call with any concerns at any time.   Radiation Oncology Noreene Filbert, MD

## 2022-06-02 ENCOUNTER — Ambulatory Visit: Payer: Medicare Other | Admitting: Radiation Oncology

## 2022-06-07 ENCOUNTER — Encounter: Payer: Self-pay | Admitting: Physician Assistant

## 2022-06-07 ENCOUNTER — Ambulatory Visit (INDEPENDENT_AMBULATORY_CARE_PROVIDER_SITE_OTHER): Payer: Medicare Other | Admitting: Physician Assistant

## 2022-06-07 VITALS — BP 162/84 | HR 91 | Temp 98.2°F | Resp 16 | Ht 62.5 in | Wt 102.8 lb

## 2022-06-07 DIAGNOSIS — E039 Hypothyroidism, unspecified: Secondary | ICD-10-CM | POA: Diagnosis not present

## 2022-06-07 DIAGNOSIS — J069 Acute upper respiratory infection, unspecified: Secondary | ICD-10-CM

## 2022-06-07 DIAGNOSIS — I10 Essential (primary) hypertension: Secondary | ICD-10-CM | POA: Diagnosis not present

## 2022-06-07 DIAGNOSIS — E875 Hyperkalemia: Secondary | ICD-10-CM | POA: Diagnosis not present

## 2022-06-07 MED ORDER — DOXYCYCLINE HYCLATE 100 MG PO TABS: 100.0000 mg | ORAL_TABLET | Freq: Two times a day (BID) | ORAL | 0 refills | Status: DC

## 2022-06-07 NOTE — Progress Notes (Signed)
[facility] [place], [place] [address]  Internal MEDICINE  Office Visit Note  Patient Name: Sabrina Zamora  [id number]  [id number]  Date of Service: 06/15/2022  Chief Complaint  Patient presents with   Acute Visit   Sore Throat    Runny and stuffy nose/ no covid test.      HPI Pt is here for a sick visit. -Had 3 little kids around her at thanksgiving and they all had runny noses -using nasal spray and takes zinc -She is now experiencing sore throat -Coughing as well, but no wheezing or SOB. A little phlegm coughed up -BP on recheck still elevated at 162/84, Bp normal at home and is always up in office -Discussed labs further which had shown slightly elevated TSH, but normal free T4 and will monitor this. Also slightly elevated potassium and sugar and will recheck labs.  Current Medication:  Outpatient Encounter Medications as of 06/07/2022  Medication Sig   ADVAIR DISKUS 250-50 MCG/ACT AEPB Inhale 1 puff into the lungs in the morning and at bedtime.   aspirin EC 81 MG tablet Take 81 mg by mouth 3 (three) times a week.   Cholecalciferol (VITAMIN D) 50 MCG (2000 UT) CAPS Take 2,000 Units by mouth daily.   denosumab (PROLIA) 60 MG/ML SOSY injection Inject 60 mg into the skin every 6 (six) months.   doxycycline (VIBRA-TABS) 100 MG tablet Take 1 tablet (100 mg total) by mouth 2 (two) times daily.   levothyroxine (SYNTHROID) 25 MCG tablet TAKE ONE AND ONE-HALF TABLETS DAILY BEFORE BREAKFAST   lovastatin (MEVACOR) 20 MG tablet TAKE 1 TABLET BY MOUTH NIGHTLY   Multiple Vitamin (MULTI-VITAMINS) TABS Take 1 tablet by mouth daily.   mupirocin nasal ointment (BACTROBAN) 2 % Place 1 application into the nose at bedtime.   tamoxifen (NOLVADEX) 20 MG tablet Take 1 tablet (20 mg total) by mouth daily.   Zinc 25 MG TABS Take 25 mg by mouth daily.   [DISCONTINUED] amLODipine (NORVASC) 2.5 MG tablet TAKE 1 TABLET BY MOUTH ONCE DAILY   No facility-administered  encounter medications on file as of 06/07/2022.      Medical History: Past Medical History:  Diagnosis Date   Asthma    GERD (gastroesophageal reflux disease)    High cholesterol    Hypertension    Hypothyroidism    Melanoma ([place]) 1986, 2008, 2016, 2021   melenoma left arm, left leg, back, back respectively   Osteoporosis    Polycythemia vera ([place])    Skin cancer 2012, 2016   Sleep apnea      Vital Signs: BP (!) 171/93   Pulse 91   Temp 98.2 F (36.8 C)   Resp 16   Ht 5' 2.5" (1.588 m)   Wt 102 lb 12.8 oz (46.6 kg)   SpO2 94%   BMI 18.50 kg/m    Review of Systems  Constitutional:  Negative for fatigue and fever.  HENT:  Positive for congestion, postnasal drip, rhinorrhea and sore throat. Negative for mouth sores.   Respiratory:  Positive for cough. Negative for shortness of breath and wheezing.   Cardiovascular:  Negative for chest pain.  Genitourinary:  Negative for flank pain.  Psychiatric/Behavioral: Negative.      Physical Exam Vitals and nursing note reviewed.  Constitutional:      Appearance: Normal appearance. She is normal weight.  HENT:     Head: Normocephalic and atraumatic.     Nose: Congestion present.  Mouth/Throat:     Pharynx: Posterior oropharyngeal erythema present.  Eyes:     Pupils: Pupils are equal, round, and reactive to light.  Cardiovascular:     Rate and Rhythm: Normal rate and regular rhythm.     Pulses: Normal pulses.     Heart sounds: Normal heart sounds.  Pulmonary:     Effort: Pulmonary effort is normal.     Breath sounds: Normal breath sounds.  Abdominal:     General: Abdomen is flat.     Palpations: Abdomen is soft.  Musculoskeletal:        General: Normal range of motion.     Cervical back: Normal range of motion.  Skin:    General: Skin is warm.  Neurological:     General: No focal deficit present.     Mental Status: She is alert and oriented to person, place, and time. Mental status is at baseline.   Psychiatric:        Mood and Affect: Mood normal.        Behavior: Behavior normal.        Thought Content: Thought content normal.        Judgment: Judgment normal.       Assessment/Plan: 1. Upper respiratory tract infection, unspecified type Will start on doxycycline for URI and continue nasal spray. May also use mucinex to help congestion and should rest. - doxycycline (VIBRA-TABS) 100 MG tablet; Take 1 tablet (100 mg total) by mouth 2 (two) times daily.  Dispense: 14 tablet; Refill: 0  2. Hypothyroidism, adult Will monitor labs closely due to TSH rising - TSH + free T4  3. Hyperkalemia Will recheck labs - Comprehensive metabolic panel  4. White coat syndrome with diagnosis of hypertension Well controlled at home and elevated in office, continue to monitor closely and continue current medications   General Counseling: Nylah verbalizes understanding of the findings of todays visit and agrees with plan of treatment. I have discussed any further diagnostic evaluation that may be needed or ordered today. We also reviewed her medications today. she has been encouraged to call the office with any questions or concerns that should arise related to todays visit.    Counseling:    Orders Placed This Encounter  Procedures   TSH + free T4   Comprehensive metabolic panel    Meds ordered this encounter  Medications   doxycycline (VIBRA-TABS) 100 MG tablet    Sig: Take 1 tablet (100 mg total) by mouth 2 (two) times daily.    Dispense:  14 tablet    Refill:  0    Time spent:30 Minutes

## 2022-06-10 ENCOUNTER — Other Ambulatory Visit: Payer: Self-pay | Admitting: Physician Assistant

## 2022-06-16 ENCOUNTER — Other Ambulatory Visit: Payer: Self-pay

## 2022-06-16 ENCOUNTER — Telehealth: Payer: Self-pay

## 2022-06-16 MED ORDER — AZITHROMYCIN 250 MG PO TABS: ORAL_TABLET | ORAL | 0 refills | Status: DC

## 2022-06-16 NOTE — Telephone Encounter (Signed)
Pt called pt called that she still coughing and sinusitis she said you said if she is not feeling better you will send Zpak as per lauren send Zpak and also advised continue OTC

## 2022-06-17 ENCOUNTER — Emergency Department
Admission: EM | Admit: 2022-06-17 | Discharge: 2022-06-17 | Disposition: A | Discharge status: Home / Self Care | Payer: Medicare Other | Attending: Emergency Medicine | Admitting: Emergency Medicine

## 2022-06-17 ENCOUNTER — Encounter: Payer: Self-pay | Admitting: Emergency Medicine

## 2022-06-17 ENCOUNTER — Emergency Department: Payer: Medicare Other

## 2022-06-17 ENCOUNTER — Other Ambulatory Visit: Payer: Self-pay

## 2022-06-17 DIAGNOSIS — I10 Essential (primary) hypertension: Secondary | ICD-10-CM | POA: Insufficient documentation

## 2022-06-17 DIAGNOSIS — E871 Hypo-osmolality and hyponatremia: Secondary | ICD-10-CM | POA: Diagnosis not present

## 2022-06-17 DIAGNOSIS — E039 Hypothyroidism, unspecified: Secondary | ICD-10-CM | POA: Insufficient documentation

## 2022-06-17 DIAGNOSIS — Z20822 Contact with and (suspected) exposure to covid-19: Secondary | ICD-10-CM | POA: Diagnosis not present

## 2022-06-17 DIAGNOSIS — D72829 Elevated white blood cell count, unspecified: Secondary | ICD-10-CM | POA: Diagnosis not present

## 2022-06-17 DIAGNOSIS — J45909 Unspecified asthma, uncomplicated: Secondary | ICD-10-CM | POA: Insufficient documentation

## 2022-06-17 DIAGNOSIS — J189 Pneumonia, unspecified organism: Secondary | ICD-10-CM | POA: Diagnosis not present

## 2022-06-17 DIAGNOSIS — R059 Cough, unspecified: Secondary | ICD-10-CM | POA: Diagnosis not present

## 2022-06-17 LAB — CBC WITH DIFFERENTIAL/PLATELET
Abs Immature Granulocytes: 0.15 10*3/uL — ABNORMAL HIGH (ref 0.00–0.07)
Basophils Absolute: 0.1 10*3/uL (ref 0.0–0.1)
Basophils Relative: 0 %
Eosinophils Absolute: 0 10*3/uL (ref 0.0–0.5)
Eosinophils Relative: 0 %
HCT: 44.4 % (ref 36.0–46.0)
Hemoglobin: 14.7 g/dL (ref 12.0–15.0)
Immature Granulocytes: 1 %
Lymphocytes Relative: 4 %
Lymphs Abs: 0.9 10*3/uL (ref 0.7–4.0)
MCH: 30.8 pg (ref 26.0–34.0)
MCHC: 33.1 g/dL (ref 30.0–36.0)
MCV: 93.1 fL (ref 80.0–100.0)
Monocytes Absolute: 1.1 10*3/uL — ABNORMAL HIGH (ref 0.1–1.0)
Monocytes Relative: 5 %
Neutro Abs: 18.7 10*3/uL — ABNORMAL HIGH (ref 1.7–7.7)
Neutrophils Relative %: 90 %
Platelets: 182 10*3/uL (ref 150–400)
RBC: 4.77 MIL/uL (ref 3.87–5.11)
RDW: 12.6 % (ref 11.5–15.5)
WBC: 20.9 10*3/uL — ABNORMAL HIGH (ref 4.0–10.5)
nRBC: 0 % (ref 0.0–0.2)

## 2022-06-17 LAB — RESP PANEL BY RT-PCR (FLU A&B, COVID) ARPGX2
Influenza A by PCR: NEGATIVE
Influenza B by PCR: NEGATIVE
SARS Coronavirus 2 by RT PCR: NEGATIVE

## 2022-06-17 LAB — BASIC METABOLIC PANEL
Anion gap: 10 (ref 5–15)
BUN: 17 mg/dL (ref 8–23)
CO2: 22 mmol/L (ref 22–32)
Calcium: 8.3 mg/dL — ABNORMAL LOW (ref 8.9–10.3)
Chloride: 98 mmol/L (ref 98–111)
Creatinine, Ser: 0.64 mg/dL (ref 0.44–1.00)
GFR, Estimated: >60 mL/min (ref >60)
Glucose, Bld: 117 mg/dL — ABNORMAL HIGH (ref 70–99)
Potassium: 3.5 mmol/L (ref 3.5–5.1)
Sodium: 130 mmol/L — ABNORMAL LOW (ref 135–145)

## 2022-06-17 MED ORDER — ALBUTEROL SULFATE HFA 108 (90 BASE) MCG/ACT IN AERS: 2.0000 | INHALATION_SPRAY | Freq: Four times a day (QID) | RESPIRATORY_TRACT | 2 refills | Status: DC | PRN

## 2022-06-17 MED ORDER — CEFUROXIME AXETIL 500 MG PO TABS: 500.0000 mg | ORAL_TABLET | Freq: Two times a day (BID) | ORAL | 0 refills | Status: AC

## 2022-06-17 MED ORDER — SODIUM CHLORIDE 0.9 % IV SOLN
1.0000 g | Freq: Once | INTRAVENOUS | Status: AC
  Administered 2022-06-17: 1 g via INTRAVENOUS
  Filled 2022-06-17: qty 10

## 2022-06-17 MED ORDER — PREDNISONE 10 MG PO TABS: ORAL_TABLET | ORAL | 0 refills | Status: AC

## 2022-06-17 NOTE — ED Provider Notes (Addendum)
[facility] Provider Note    Event Date/Time   First MD Initiated Contact with Patient 06/17/22 1134     (approximate)   History   Chief Complaint Cough   HPI Sabrina Zamora is a 88 y.o. female, history of anxiety, OSA, hypothyroidism, hypertension, asthma, polycythemia vera, GERD, hyperlipidemia, presents to the emergency department for evaluation of cough/congestion.  She states that she has been having the symptoms since after Thanksgiving.  She has been treated with a 7-day course of doxycycline.  When she did not receive any improvement, she was prescribed azithromycin.  She is currently on day 2 of her azithromycin.  She states that overall she feels okay, but is not getting any better.  Denies chest pain, shortness of breath, abdominal pain, nausea/vomiting, diarrhea, urinary symptoms, dizziness/lightheadedness, or weakness.  History Limitations: No limitations.        Physical Exam  Triage Vital Signs: ED Triage Vitals  Enc Vitals Group     BP 06/17/22 1032 (!) 157/81     Pulse Rate 06/17/22 1032 99     Resp 06/17/22 1032 16     Temp 06/17/22 1032 99.9 F (37.7 C)     Temp Source 06/17/22 1032 Oral     SpO2 06/17/22 1032 90 %     Weight 06/17/22 1032 98 lb (44.5 kg)     Height 06/17/22 1032 5\' 2"  (1.575 m)     Head Circumference --      Peak Flow --      Pain Score 06/17/22 1045 0     Pain Loc --      Pain Edu? --      Excl. in [place]? --     Most recent vital signs: Vitals:   06/17/22 1044 06/17/22 1324  BP: (!) 157/81 (!) 150/80  Pulse: 99 90  Resp: 20 20  Temp: 99.9 F (37.7 C)   SpO2: 94% 95%    General: Awake, NAD.  Audible productive cough. Skin: Warm, dry. No rashes or lesions.  Eyes: PERRL. Conjunctivae normal.  CV: Good peripheral perfusion.  Resp: Normal effort.  Moderate rhonchi bilaterally, particularly in the lower right lobe. Abd: Soft, non-tender. No distention.  Neuro: At baseline. No gross neurological  deficits.  Musculoskeletal: Normal ROM of all extremities.   Physical Exam    ED Results / Procedures / Treatments  Labs (all labs ordered are listed, but only abnormal results are displayed) Labs Reviewed  CBC WITH DIFFERENTIAL/PLATELET - Abnormal; Notable for the following components:      Result Value   WBC 20.9 (*)    Neutro Abs 18.7 (*)    Monocytes Absolute 1.1 (*)    Abs Immature Granulocytes 0.15 (*)    All other components within normal limits  BASIC METABOLIC PANEL - Abnormal; Notable for the following components:   Sodium 130 (*)    Glucose, Bld 117 (*)    Calcium 8.3 (*)    All other components within normal limits  RESP PANEL BY RT-PCR (FLU A&B, COVID) [id number]     EKG N/A.    RADIOLOGY  ED Provider Interpretation: I personally reviewed and interpreted this x-ray, evidence suggestive of pneumonia in the medial right lung base.  DG Chest 2 View  Result Date: 06/17/2022 CLINICAL DATA:  Provided history: Cough, congestion. Additional history provided: Nasal congestion, cough, chest congestion. EXAM: CHEST - 2 VIEW COMPARISON:  CT 05/11/2022. Prior chest radiographs 06/13/2020 and earlier. FINDINGS: Heart size within normal limits.  Aortic atherosclerosis. Redemonstrated post radiation changes within the left mid lung. Ill-defined opacity within the medial right lung base. No evidence of pleural effusion or pneumothorax. No acute bony abnormality identified. IMPRESSION: Ill-defined opacity within the medial right lung base, new from prior exams and compatible with pneumonia in the appropriate clinical setting. Followup PA and lateral chest radiographs are recommended in 3-4 weeks following a trial of antibiotic therapy to ensure resolution and exclude underlying malignancy at this site. Redemonstrated post radiation changes within the left mid lung. Aortic Atherosclerosis (ICD10-I70.0). Electronically Signed   By: Kellie Simmering D.O.   On: 06/17/2022 11:17     PROCEDURES:  Critical Care performed: N/A.  Procedures    MEDICATIONS ORDERED IN ED: Medications  cefTRIAXone (ROCEPHIN) 1 g in sodium chloride 0.9 % 100 mL IVPB (0 g Intravenous Stopped 06/17/22 1324)     IMPRESSION / MDM / ASSESSMENT AND PLAN / ED COURSE  I reviewed the triage vital signs and the nursing notes.                              Differential diagnosis includes, but is not limited to, COVID-19, influenza, bronchitis, community-acquired pneumonia, malignancy.  ED Course Patient appears well, vitals within normal limits.  NAD.  Respiratory panel negative for COVID-19 or influenza.  CBC shows notable leukocytosis at 20.9.  No anemia.  BMP shows hyponatremia at 130 and hypocalcemia 8.3.   Assessment/Plan Presentation consistent with community-acquired pneumonia, confirmed by chest x-ray which shows ill-defined opacity in the right middle lobe.  She appears well clinically, though her lab workup does show a significant leukocytosis at 20.9.  Vitals are within normal limits.  Treat her here with single dose of IV ceftriaxone.  Given her age, comorbidities, and positive findings, I did offer admission to her.  However, she states that she does not want to be admitted and would like to try a different medication at home.  It appears her pneumonia was refractory to doxycycline treatment initially.  Penicillins limited by allergy.  However, she has had cefuroxime in the past with some success.  Will provide her with a prescription for this.  Encouraged her to continue taking her azithromycin as well.  Will additionally provide her with prednisone to cover for her underlying asthma, as well as a albuterol inhaler.  Encouraged her to continue taking Mucinex as well.  She was additionally found to be hyponatremic today with sodium level of 130.  Currently asymptomatic.  Likely due to the fact that she has not eaten any food in the past 3 days.  No acute treatment needed at this  time. Advised her to begin transitioning into eating meals and drinking electrolyte rich fluids.  Given her age and comorbidities, I did strictly recommend follow-up within the next 24 to 48 hours with her regular doctor for reevaluation.  She was agreeable to this.  Will discharge.  Provided the patient with anticipatory guidance, return precautions, and educational material. Encouraged the patient to return to the emergency department at any time if they begin to experience any new or worsening symptoms. Patient expressed understanding and agreed with the plan.   Patient's presentation is most consistent with acute presentation with potential threat to life or bodily function.       FINAL CLINICAL IMPRESSION(S) / ED DIAGNOSES   Final diagnoses:  Community acquired pneumonia of right middle lobe of lung     Rx / DC Orders  ED Discharge Orders          Ordered    cefUROXime (CEFTIN) 500 MG tablet  2 times daily with meals        06/17/22 1306    albuterol (VENTOLIN HFA) 108 (90 Base) MCG/ACT inhaler  Every 6 hours PRN        06/17/22 1306    predniSONE (DELTASONE) 10 MG tablet  Q breakfast        06/17/22 1307             Note:  This document was prepared using Dragon voice recognition software and may include unintentional dictation errors.   Teodoro Spray, [place] 06/17/22 1312    Teodoro Spray, [place] 06/17/22 1457    Harvest Dark, MD 06/17/22 1530

## 2022-06-17 NOTE — ED Notes (Signed)
See triage note  Presents with cough for about 2-3 weeks   Low grade temp on arrival

## 2022-06-17 NOTE — Discharge Instructions (Signed)
-  Please take the full course of the cefuroxime as prescribed.  Please finish the azithromycin as well.  -Please take the full course of the prednisone taper to help with inflammation.  You may additionally utilize the albuterol inhaler as needed for cough/shortness of breath.  -Please follow-up with primary care provider within the next 1 to 3 days for reevaluation.  -There is a chance that this opacity found on your x-ray is not pneumonia and may in fact be something else.  You will need to have a repeat chest x-ray performed in 4 to 6 weeks to monitor for improvement.  You may coordinate this with your regular doctor.  -Return to the emergency department anytime if you begin to experience any new or worsening symptoms.

## 2022-06-17 NOTE — ED Triage Notes (Signed)
Pt via POV from home. Pt states after Thanksgiving she was having nasal congestion, cough, and chest congestion. States that she has been on multiple antibiotics with no relief. Cough is productive. Denies SOB. Denies fever. Denies pain. Pt is A&OX4 and NAD

## 2022-06-21 ENCOUNTER — Encounter: Payer: Self-pay | Admitting: Nurse Practitioner

## 2022-06-21 ENCOUNTER — Ambulatory Visit (INDEPENDENT_AMBULATORY_CARE_PROVIDER_SITE_OTHER): Payer: Medicare Other | Admitting: Nurse Practitioner

## 2022-06-21 VITALS — BP 140/80 | HR 94 | Temp 96.1°F | Resp 16 | Ht 62.5 in | Wt 102.0 lb

## 2022-06-21 DIAGNOSIS — J189 Pneumonia, unspecified organism: Secondary | ICD-10-CM

## 2022-06-21 NOTE — Progress Notes (Signed)
[facility] [place], [place] [address]  Internal MEDICINE  Office Visit Note  Patient Name: Sabrina Zamora  [id number]  [id number]  Date of Service: 06/21/2022  Chief Complaint  Patient presents with   Follow-up    ED f/u- pneumonia   Gastroesophageal Reflux   Hypertension   Hyperlipidemia    HPI [name] presents for a follow up visit after going to the ER for pneumonia.  --has pneumonia of the left middle lobe -- tx with zpack, then doxycycline and now cefuroxime.  Also having sinusitis symptoms      Current Medication: Outpatient Encounter Medications as of 06/21/2022  Medication Sig   ADVAIR DISKUS 250-50 MCG/ACT AEPB Inhale 1 puff into the lungs in the morning and at bedtime.   albuterol (VENTOLIN HFA) 108 (90 Base) MCG/ACT inhaler Inhale 2 puffs into the lungs every 6 (six) hours as needed for wheezing or shortness of breath.   amLODipine (NORVASC) 2.5 MG tablet TAKE 1 TABLET BY MOUTH ONCE DAILY   aspirin EC 81 MG tablet Take 81 mg by mouth 3 (three) times a week.   azithromycin (ZITHROMAX) 250 MG tablet Use as directed for 5 days   cefUROXime (CEFTIN) 500 MG tablet Take 1 tablet (500 mg total) by mouth 2 (two) times daily with a meal for 7 days.   Cholecalciferol (VITAMIN D) 50 MCG (2000 UT) CAPS Take 2,000 Units by mouth daily.   denosumab (PROLIA) 60 MG/ML SOSY injection Inject 60 mg into the skin every 6 (six) months.   levothyroxine (SYNTHROID) 25 MCG tablet TAKE ONE AND ONE-HALF TABLETS DAILY BEFORE BREAKFAST   lovastatin (MEVACOR) 20 MG tablet TAKE 1 TABLET BY MOUTH NIGHTLY   Multiple Vitamin (MULTI-VITAMINS) TABS Take 1 tablet by mouth daily.   mupirocin nasal ointment (BACTROBAN) 2 % Place 1 application into the nose at bedtime.   predniSONE (DELTASONE) 10 MG tablet Take 4 tablets (40 mg total) by mouth daily with breakfast for 2 days, THEN 3 tablets (30 mg total) daily with breakfast for 2 days, THEN 2 tablets (20 mg total) daily with  breakfast for 2 days, THEN 1 tablet (10 mg total) daily with breakfast for 2 days.   tamoxifen (NOLVADEX) 20 MG tablet Take 1 tablet (20 mg total) by mouth daily.   Zinc 25 MG TABS Take 25 mg by mouth daily.   No facility-administered encounter medications on file as of 06/21/2022.    Surgical History: Past Surgical History:  Procedure Laterality Date   BREAST BIOPSY Left 2003   neg   BREAST BIOPSY Right 09/27/2017   12:00 1 cmfn fibroadenomatous change    BREAST BIOPSY Right 09/17/2017   12:00 3 cmfn benign breast and fibroadipose tissue   BREAST BIOPSY Left 10/14/2020   stereo biopsy, ribbon clip/ INTERMEDIATE GRADE DUCTAL CARCINOMA IN SITU    BREAST EXCISIONAL BIOPSY Left 1988   excisional - neg   BREAST LUMPECTOMY Left 11/25/2020   BREAST LUMPECTOMY WITH RADIOFREQUENCY TAG IDENTIFICATION Left 40/04/2724   Procedure: BREAST LUMPECTOMY WITH RADIOFREQUENCY TAG IDENTIFICATION;  Surgeon: Jules Husbands, MD;  Location: [facility];  Service: General;  Laterality: Left;   CATARACT EXTRACTION W/PHACO Left 09/20/2017   Procedure: CATARACT EXTRACTION PHACO AND INTRAOCULAR LENS PLACEMENT ([place]) LEFT;  Surgeon: Leandrew Koyanagi, MD;  Location: [place];  Service: Ophthalmology;  Laterality: Left;   CATARACT EXTRACTION W/PHACO Right 02/15/2018   Procedure: CATARACT EXTRACTION PHACO AND INTRAOCULAR LENS PLACEMENT ([place]) TOPICAL  RIGHT;  Surgeon: Leandrew Koyanagi,  MD;  Location: [facility];  Service: Ophthalmology;  Laterality: Right;  prefers early   CHOLECYSTECTOMY     EYE SURGERY      Medical History: Past Medical History:  Diagnosis Date   Asthma    GERD (gastroesophageal reflux disease)    High cholesterol    Hypertension    Hypothyroidism    Melanoma ([place]) 1986, 2008, 2016, 2021   melenoma left arm, left leg, back, back respectively   Osteoporosis    Polycythemia vera ([place])    Skin cancer 2012, 2016   Sleep apnea     Family History: Family History   Problem Relation Age of Onset   Clotting disorder Sister    Breast cancer Sister 79   Heart disease Mother    Heart disease Father     Social History   Socioeconomic History   Marital status: Married    Spouse name: Sabrina Zamora   Number of children: 3   Years of education: Not on file   Highest education level: Not on file  Occupational History   Not on file  Tobacco Use   Smoking status: Never   Smokeless tobacco: Never  Vaping Use   Vaping Use: Never used  Substance and Sexual Activity   Alcohol use: Never    Alcohol/week: 0.0 standard drinks of alcohol   Drug use: Never   Sexual activity: Not on file  Other Topics Concern   Not on file  Social History Narrative   Not on file   Social Determinants of Health   Financial Resource Strain: Not on file  Food Insecurity: Not on file  Transportation Needs: Not on file  Physical Activity: Not on file  Stress: Not on file  Social Connections: Not on file  Intimate Partner Violence: Not on file      Review of Systems  Constitutional:  Positive for fatigue.  HENT:  Positive for congestion, postnasal drip, rhinorrhea and sinus pressure.   Respiratory:  Positive for cough, chest tightness, shortness of breath and wheezing.   Cardiovascular: Negative.  Negative for chest pain and palpitations.  Neurological:  Positive for headaches.    Vital Signs: BP (!) 140/80   Pulse 94   Temp (!) 96.1 F (35.6 C)   Resp 16   Ht 5' 2.5" (1.588 m)   Wt 102 lb (46.3 kg)   SpO2 96%   BMI 18.36 kg/m    Physical Exam Vitals reviewed.  Constitutional:      General: She is not in acute distress.    Appearance: Normal appearance. She is normal weight. She is not ill-appearing.  HENT:     Head: Normocephalic and atraumatic.  Eyes:     Pupils: Pupils are equal, round, and reactive to light.  Cardiovascular:     Rate and Rhythm: Normal rate and regular rhythm.  Pulmonary:     Effort: Pulmonary effort is normal. No respiratory  distress.  Neurological:     Mental Status: She is alert and oriented to person, place, and time.  Psychiatric:        Mood and Affect: Mood normal.        Behavior: Behavior normal.        Assessment/Plan: 1. Pneumonia of right middle lobe due to infectious organism Repeat chest xray in 5-6 weeks - DG Chest 2 View; Future   General Counseling: Audrianna verbalizes understanding of the findings of todays visit and agrees with plan of treatment. I have discussed any further diagnostic evaluation  that may be needed or ordered today. We also reviewed her medications today. she has been encouraged to call the office with any questions or concerns that should arise related to todays visit.    Orders Placed This Encounter  Procedures   DG Chest 2 View    No orders of the defined types were placed in this encounter.   Return if symptoms worsen or fail to improve.   Total time spent:20 Minutes Time spent includes review of chart, medications, test results, and follow up plan with the patient.   [place] Controlled Substance Database was reviewed by me.  This patient was seen by Jonetta Osgood, FNP-C in collaboration with Dr. Clayborn Bigness as a part of collaborative care agreement.   Isiaah Cuervo R. Valetta Fuller, MSN, FNP-C Internal medicine

## 2022-06-22 ENCOUNTER — Encounter: Payer: Self-pay | Admitting: Nurse Practitioner

## 2022-06-24 ENCOUNTER — Telehealth: Payer: Self-pay

## 2022-06-24 NOTE — Telephone Encounter (Signed)
Pt called that she finished antibiotic  and still not feeling better still little cough ing little advised her to give little time used inhaler and take mucinex or delsym if she not feeling better then call us back

## 2022-06-25 ENCOUNTER — Other Ambulatory Visit: Payer: Self-pay | Admitting: Physician Assistant

## 2022-06-29 ENCOUNTER — Telehealth: Payer: Self-pay

## 2022-06-29 NOTE — Telephone Encounter (Signed)
     Patient  visit on 12/7  at [facility]   Have you been able to follow up with your primary care physician? Yes   The patient was or was not able to obtain any needed medicine or equipment. Yes   Are there diet recommendations that you are having difficulty following? Na   Patient expresses understanding of discharge instructions and education provided has no other needs at this time.  Yes      [place], [facility], Care Management  [phone] [address], [place], [place] [address] Phone: [phone] Email: [email or web address]

## 2022-07-22 DIAGNOSIS — M810 Age-related osteoporosis without current pathological fracture: Secondary | ICD-10-CM | POA: Diagnosis not present

## 2022-07-28 ENCOUNTER — Other Ambulatory Visit: Payer: Self-pay | Admitting: Oncology

## 2022-07-28 DIAGNOSIS — E039 Hypothyroidism, unspecified: Secondary | ICD-10-CM | POA: Diagnosis not present

## 2022-07-28 DIAGNOSIS — E875 Hyperkalemia: Secondary | ICD-10-CM | POA: Diagnosis not present

## 2022-07-29 LAB — COMPREHENSIVE METABOLIC PANEL
ALT: 19 IU/L (ref 0–32)
AST: 31 IU/L (ref 0–40)
Albumin/Globulin Ratio: 1.6 (ref 1.2–2.2)
Albumin: 4.4 g/dL (ref 3.7–4.7)
Alkaline Phosphatase: 56 IU/L (ref 44–121)
BUN/Creatinine Ratio: 23 (ref 12–28)
BUN: 17 mg/dL (ref 8–27)
Bilirubin Total: 0.3 mg/dL (ref 0.0–1.2)
CO2: 23 mmol/L (ref 20–29)
Calcium: 9.7 mg/dL (ref 8.7–10.3)
Chloride: 103 mmol/L (ref 96–106)
Creatinine, Ser: 0.73 mg/dL (ref 0.57–1.00)
Globulin, Total: 2.8 g/dL (ref 1.5–4.5)
Glucose: 95 mg/dL (ref 70–99)
Potassium: 5 mmol/L (ref 3.5–5.2)
Sodium: 143 mmol/L (ref 134–144)
Total Protein: 7.2 g/dL (ref 6.0–8.5)
eGFR: 79 mL/min/{1.73_m2} (ref >59)

## 2022-07-29 LAB — TSH+FREE T4
Free T4: 1.43 ng/dL (ref 0.82–1.77)
TSH: 2.31 u[IU]/mL (ref 0.450–4.500)

## 2022-07-30 ENCOUNTER — Telehealth: Payer: Self-pay

## 2022-07-30 DIAGNOSIS — D2261 Melanocytic nevi of right upper limb, including shoulder: Secondary | ICD-10-CM | POA: Diagnosis not present

## 2022-07-30 DIAGNOSIS — L570 Actinic keratosis: Secondary | ICD-10-CM | POA: Diagnosis not present

## 2022-07-30 DIAGNOSIS — D2262 Melanocytic nevi of left upper limb, including shoulder: Secondary | ICD-10-CM | POA: Diagnosis not present

## 2022-07-30 DIAGNOSIS — D2271 Melanocytic nevi of right lower limb, including hip: Secondary | ICD-10-CM | POA: Diagnosis not present

## 2022-07-30 DIAGNOSIS — Z1283 Encounter for screening for malignant neoplasm of skin: Secondary | ICD-10-CM | POA: Diagnosis not present

## 2022-07-30 DIAGNOSIS — D2272 Melanocytic nevi of left lower limb, including hip: Secondary | ICD-10-CM | POA: Diagnosis not present

## 2022-07-30 DIAGNOSIS — D225 Melanocytic nevi of trunk: Secondary | ICD-10-CM | POA: Diagnosis not present

## 2022-07-30 NOTE — Telephone Encounter (Signed)
-----  Message from Carlean Jews, PA-C sent at 07/30/2022  1:04 PM EST ----- Please let her know that her labs are normal

## 2022-07-30 NOTE — Telephone Encounter (Signed)
Left message for patient regarding normal lab results.

## 2022-08-10 ENCOUNTER — Encounter: Payer: Self-pay | Admitting: Hematology and Oncology

## 2022-08-10 ENCOUNTER — Ambulatory Visit
Admission: RE | Admit: 2022-08-10 | Discharge: 2022-08-10 | Disposition: A | Discharge status: Home / Self Care | Payer: Medicare Other | Source: Ambulatory Visit | Attending: Nurse Practitioner | Admitting: Nurse Practitioner

## 2022-08-10 ENCOUNTER — Ambulatory Visit
Admission: RE | Admit: 2022-08-10 | Discharge: 2022-08-10 | Disposition: A | Discharge status: Home / Self Care | Payer: Medicare Other | Attending: Nurse Practitioner | Admitting: Nurse Practitioner

## 2022-08-10 DIAGNOSIS — J189 Pneumonia, unspecified organism: Secondary | ICD-10-CM

## 2022-08-17 NOTE — Progress Notes (Signed)
Chest xray shows that the pneumonia has resolved. Please let her know

## 2022-08-25 ENCOUNTER — Other Ambulatory Visit: Payer: Self-pay | Admitting: Physician Assistant

## 2022-09-06 ENCOUNTER — Inpatient Hospital Stay: Payer: Medicare Other | Attending: Oncology | Admitting: Oncology

## 2022-09-06 ENCOUNTER — Inpatient Hospital Stay: Payer: Medicare Other

## 2022-09-06 ENCOUNTER — Encounter: Payer: Self-pay | Admitting: Oncology

## 2022-09-06 VITALS — BP 154/82 | HR 61 | Temp 97.3°F | Resp 18 | Wt 97.6 lb

## 2022-09-06 DIAGNOSIS — C50912 Malignant neoplasm of unspecified site of left female breast: Secondary | ICD-10-CM

## 2022-09-06 DIAGNOSIS — Z79810 Long term (current) use of selective estrogen receptor modulators (SERMs): Secondary | ICD-10-CM | POA: Insufficient documentation

## 2022-09-06 DIAGNOSIS — Z170 Estrogen receptor positive status [ER+]: Secondary | ICD-10-CM | POA: Diagnosis not present

## 2022-09-06 DIAGNOSIS — Z79899 Other long term (current) drug therapy: Secondary | ICD-10-CM | POA: Diagnosis not present

## 2022-09-06 DIAGNOSIS — Z923 Personal history of irradiation: Secondary | ICD-10-CM | POA: Diagnosis not present

## 2022-09-06 DIAGNOSIS — D751 Secondary polycythemia: Secondary | ICD-10-CM

## 2022-09-06 DIAGNOSIS — R911 Solitary pulmonary nodule: Secondary | ICD-10-CM

## 2022-09-06 DIAGNOSIS — D0512 Intraductal carcinoma in situ of left breast: Secondary | ICD-10-CM | POA: Diagnosis not present

## 2022-09-06 LAB — COMPREHENSIVE METABOLIC PANEL
ALT: 18 U/L (ref 0–44)
AST: 28 U/L (ref 15–41)
Albumin: 3.7 g/dL (ref 3.5–5.0)
Alkaline Phosphatase: 33 U/L — ABNORMAL LOW (ref 38–126)
Anion gap: 8 (ref 5–15)
BUN: 22 mg/dL (ref 8–23)
CO2: 28 mmol/L (ref 22–32)
Calcium: 9.3 mg/dL (ref 8.9–10.3)
Chloride: 101 mmol/L (ref 98–111)
Creatinine, Ser: 0.84 mg/dL (ref 0.44–1.00)
GFR, Estimated: >60 mL/min (ref >60)
Glucose, Bld: 89 mg/dL (ref 70–99)
Potassium: 4.9 mmol/L (ref 3.5–5.1)
Sodium: 137 mmol/L (ref 135–145)
Total Bilirubin: 0.7 mg/dL (ref 0.3–1.2)
Total Protein: 7 g/dL (ref 6.5–8.1)

## 2022-09-06 LAB — CBC WITH DIFFERENTIAL/PLATELET
Abs Immature Granulocytes: 0.02 10*3/uL (ref 0.00–0.07)
Basophils Absolute: 0.1 10*3/uL (ref 0.0–0.1)
Basophils Relative: 1 %
Eosinophils Absolute: 0.1 10*3/uL (ref 0.0–0.5)
Eosinophils Relative: 1 %
HCT: 47.4 % — ABNORMAL HIGH (ref 36.0–46.0)
Hemoglobin: 15.4 g/dL — ABNORMAL HIGH (ref 12.0–15.0)
Immature Granulocytes: 0 %
Lymphocytes Relative: 32 %
Lymphs Abs: 2 10*3/uL (ref 0.7–4.0)
MCH: 31.5 pg (ref 26.0–34.0)
MCHC: 32.5 g/dL (ref 30.0–36.0)
MCV: 96.9 fL (ref 80.0–100.0)
Monocytes Absolute: 0.8 10*3/uL (ref 0.1–1.0)
Monocytes Relative: 12 %
Neutro Abs: 3.5 10*3/uL (ref 1.7–7.7)
Neutrophils Relative %: 54 %
Platelets: 177 10*3/uL (ref 150–400)
RBC: 4.89 MIL/uL (ref 3.87–5.11)
RDW: 13.3 % (ref 11.5–15.5)
WBC: 6.4 10*3/uL (ref 4.0–10.5)
nRBC: 0 % (ref 0.0–0.2)

## 2022-09-06 MED ORDER — TAMOXIFEN CITRATE 20 MG PO TABS: 20.0000 mg | ORAL_TABLET | Freq: Every day | ORAL | 1 refills | Status: DC

## 2022-09-06 NOTE — Progress Notes (Signed)
Hematology/Oncology Progress note [phone] [phone]    Chief Complaint: secondary polycythemia, lung nodule [presumed lung cancer], left breast DCIS with microinvasive component.  ASSESSMENT & PLAN:  ASSESSMENT & PLAN:   Ductal carcinoma in situ (DCIS) of left breast with microinvasive component (HCC) #Left breast DCIS with microinvasive component, pTMi pN0 Status post lumpectomy.  No sentinel lymph node biopsy was done. S/p RT Labs reviewed and discussed with patient Continue tamoxifen 20 mg daily.  Plan 5 years-- end August 2027 Continue asprin '81mg'$  daily for thrombosis prophylaxis.  Obtain annual bilateral diagnostic mammogram in March 2024  I recommend annual pelvic examination while on tamoxifen. Follow up annually with gynecologist.  Lung nodule #Lung nodule found on chest x-ray and confirmed on CT scan, no FDG activity on PET scan. Patient is status post SBRT finished in December 2023 10/30/2021, CT chest with contrast showed radiation changes.  Small left pleural effusion. Future surveillance CT scan scheduled via radiation oncologist office.  Erythrocytosis # erythrocytosis, presumed to be secondary to sleep apnea.- encourage compliance with CPAP No need for phlebotomy today.    Orders Placed This Encounter  Procedures   MM DIAG BREAST TOMO BILATERAL    Standing Status:   Future    Standing Expiration Date:   09/07/2023    Order Specific Question:   Reason for Exam (SYMPTOM  OR DIAGNOSIS REQUIRED)    Answer:   hx DCIS left breast    Order Specific Question:   Preferred imaging location?    Answer:   [facility]   US BREAST LTD UNI LEFT INC AXILLA    Standing Status:   Future    Standing Expiration Date:   09/07/2023    Order Specific Question:   Reason for Exam (SYMPTOM  OR DIAGNOSIS REQUIRED)    Answer:   hx DCIS left brest    Order Specific Question:   Preferred imaging location?    Answer:   [facility]   US BREAST LTD UNI  RIGHT INC AXILLA    Standing Status:   Future    Standing Expiration Date:   09/07/2023    Order Specific Question:   Reason for Exam (SYMPTOM  OR DIAGNOSIS REQUIRED)    Answer:   hx dcis left breast    Order Specific Question:   Preferred imaging location?    Answer:   [facility]   Follow up in 6 months.  All questions were answered. The patient knows to call the clinic with any problems, questions or concerns.  Earlie Server, MD, PhD [facility] Hematology Oncology 09/06/2022   Ductal carcinoma in situ (DCIS) of left breast with microinvasive component ([place]) #Left breast DCIS with microinvasive component, pTMi pN0 Status post lumpectomy.  No sentinel lymph node biopsy was done. S/p RT Labs reviewed and discussed with patient Continue tamoxifen 20 mg daily.  Plan 5 years-- end August 2027 Continue asprin '81mg'$  daily for thrombosis prophylaxis.  Obtain annual bilateral diagnostic mammogram in March 2024  I recommend annual pelvic examination while on tamoxifen. Follow up annually with gynecologist.  Lung nodule #Lung nodule found on chest x-ray and confirmed on CT scan, no FDG activity on PET scan. Patient is status post SBRT finished in December 2023 10/30/2021, CT chest with contrast showed radiation changes.  Small left pleural effusion. Future surveillance CT scan scheduled via radiation oncologist office.  Erythrocytosis # erythrocytosis, presumed to be secondary to sleep apnea.- encourage compliance with CPAP No need for phlebotomy today.  Orders Placed This Encounter  Procedures   MM DIAG BREAST TOMO BILATERAL    Standing Status:   Future    Standing Expiration Date:   09/07/2023    Order Specific Question:   Reason for Exam (SYMPTOM  OR DIAGNOSIS REQUIRED)    Answer:   hx DCIS left breast    Order Specific Question:   Preferred imaging location?    Answer:   [facility]   US BREAST LTD UNI LEFT INC AXILLA    Standing Status:   Future    Standing Expiration  Date:   09/07/2023    Order Specific Question:   Reason for Exam (SYMPTOM  OR DIAGNOSIS REQUIRED)    Answer:   hx DCIS left brest    Order Specific Question:   Preferred imaging location?    Answer:   [facility]   US BREAST LTD UNI RIGHT INC AXILLA    Standing Status:   Future    Standing Expiration Date:   09/07/2023    Order Specific Question:   Reason for Exam (SYMPTOM  OR DIAGNOSIS REQUIRED)    Answer:   hx dcis left breast    Order Specific Question:   Preferred imaging location?    Answer:   [facility]    All questions were answered. The patient knows to call the clinic with any problems, questions or concerns.  Earlie Server, MD, PhD [facility] Hematology Oncology 09/06/2022   HPI:  Patient previously followed up with Dr. Mike Gip.  Patient switched care to me on 11/29/2018. Extensive chart review was performed.  #Secondary polycythemia Patient has a history of sleep apnea she wears oxygen at night but no CPAP mask.  Never smoker. Work-up on 01/11/2012 reviewed and negative Jak 2 V617F mutation, exon 12 mutation. 08/25/2018 CALR negative, JAK 2 E12-15 negative, MPL negative.  Patient has been on phlebotomy program to keep hematocrit less than 48 and then later to keep it less than 50.  #History of melanoma x3.  T1 a melanoma in August 2012. saw Dermatology and had topical chemotherapy treatment to her forehead lesion.   #She has been compliant with CPAP machine. She did nocturnal oxygen testing and she is not qualified for nocturnal oxygen.   # Lung nodule -presumed lung cancer. She is a never smoker.  Report second hand smoke exposure when she grew up and during the initial few years of her marriage.    Her husband stopped smoking in 1960s.   # 06/26/2020, CT without contrast showed a partial solid nodule within the superior segment of the left lower pole.  This measures 2.4 cm with a solid component measuring 5 mm.  Bronchiectasis with tree-in-bud nodularity is noted  within the anterior and posterior basal portions of the right upper lobe.  10/06/2020 CT chest wo contrast - mixed solid and ground-glass nodule of  the anterior superior segment left lower lobe, with tenting of the adjacent fissural pleura. The overall dimensions are approximately 2.8 x 2.4 cm, previously 2.6 x 2.3 cm when measured similarly, with nodular components measuring approximately 0.5 cm and 0.7 cm. Central nodular components appear somewhat more solid than on comparison prior although are not substantially changed in size a new 5 mm pulmonary nodule of the lateral segment right middle lobe, nonspecific.Stable 4 mm nodule of the anterior right lower lobe, which remains nonspecific.4. There are numerous additional unchanged clustered and tree-in-bud centrilobular nodules, particularly in the posterior right upper lobe. 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  Patient's case was discussed on tumor board on 10/15/2020.  Consensus reached about proceed with biopsy versus PET scan+ empiric SBRT given her age. 10/28/2020, PET scan did not show activity of the left lower lobe nodule.  However her tumor board discussion, CT appearance is indicated for of lung cancer.  Patient was referred to establish care with Dr. Baruch Gouty radiation oncology and was evaluated on 11/03/2020.  RadOnc recommend watchful waiting for the time being.  Should the nodule continues to solidify and grow patient will be offered empirically SBRT at that time.  05/04/2021, CT chest showed continued slow growth of left lower lobe lung lesion. 06/23/2021, patient finished SBRT to left lower lung lesion.   Left breast DCIS with microinvasive component 09/25/20 screening mammogram showed possible distortion and calcifications in the left breast.  10/08/2020 left diagnostic mammogram showed Grouped microcalcifications in the left breast, Four circumscribed hypoechoic masses in the left breast may  represent complicated cysts and are probably benign.  10/14/2020 left breast upper outer quadrant at middle depth-- intermediate grade DCIS, 70m, with microcalcifications.  11/25/2020, patient underwent left lumpectomy by Dr. PDahlia ByesThere is microinvasive mammary carcinoma with high-grade DCIS.  Negative margins.  pTmi pNx Given her advanced age, multiple other medical conditions, strongly ER positivity, informed decision was made to omit sentinel lymph node biopsy. Case was discussed on breast cancer tumor board.  01/14/2021 finished radiation.  02/03/2021 started on tamoxifen  INTERVAL HISTORY GBONNIELOU STEINGRABERis a 89y.o. female who has above history reviewed by me today presents for follow up visit for erythrocytosis, lung nodule, left breast DCIS with microinvasive component Patient tolerates tamoxifen well.  No new breast concerns. She has been off CPAP for 3 months, due to recent pneumonia and sinus infection.  .    Review of Systems  Constitutional:  Negative for appetite change, chills, fatigue and fever.  HENT:   Negative for hearing loss and voice change.   Eyes:  Negative for eye problems.  Respiratory:  Negative for chest tightness and cough.   Cardiovascular:  Negative for chest pain.  Gastrointestinal:  Negative for abdominal distention, abdominal pain and blood in stool.  Endocrine: Negative for hot flashes.  Genitourinary:  Negative for difficulty urinating and frequency.   Musculoskeletal:  Negative for arthralgias.  Skin:  Negative for itching and rash.  Neurological:  Negative for extremity weakness.  Hematological:  Negative for adenopathy.  Psychiatric/Behavioral:  Negative for confusion.     Past Medical History:  Diagnosis Date   Asthma    GERD (gastroesophageal reflux disease)    High cholesterol    Hypertension    Hypothyroidism    Melanoma ([place], 2008, 2016, 2021   melenoma left arm, left leg, back, back respectively   Osteoporosis    Polycythemia vera  ([place]    Skin cancer 2012, 2016   Sleep apnea     Past Surgical History:  Procedure Laterality Date   BREAST BIOPSY Left 2003   neg   BREAST BIOPSY Right 09/27/2017   12:00 1 cmfn fibroadenomatous change    BREAST BIOPSY Right 09/17/2017   12:00 3 cmfn benign breast and fibroadipose tissue   BREAST BIOPSY Left 10/14/2020   stereo biopsy, ribbon clip/ INTERMEDIATE GRADE DUCTAL CARCINOMA IN SITU    BREAST EXCISIONAL BIOPSY Left 1988   excisional - neg   BREAST LUMPECTOMY Left 11/25/2020   BREAST LUMPECTOMY WITH RADIOFREQUENCY TAG IDENTIFICATION Left [id number]  Procedure: BREAST LUMPECTOMY WITH RADIOFREQUENCY TAG  IDENTIFICATION;  Surgeon: Jules Husbands, MD;  Location: [facility];  Service: General;  Laterality: Left;   CATARACT EXTRACTION W/PHACO Left 09/20/2017   Procedure: CATARACT EXTRACTION PHACO AND INTRAOCULAR LENS PLACEMENT ([place]) LEFT;  Surgeon: Leandrew Koyanagi, MD;  Location: [facility];  Service: Ophthalmology;  Laterality: Left;   CATARACT EXTRACTION W/PHACO Right 02/15/2018   Procedure: CATARACT EXTRACTION PHACO AND INTRAOCULAR LENS PLACEMENT ([place]) TOPICAL  RIGHT;  Surgeon: Leandrew Koyanagi, MD;  Location: [facility];  Service: Ophthalmology;  Laterality: Right;  prefers early   CHOLECYSTECTOMY     EYE SURGERY      Family History  Problem Relation Age of Onset   Clotting disorder Sister    Breast cancer Sister 67   Heart disease Mother    Heart disease Father     Social History:  reports that she has never smoked. She has never used smokeless tobacco. She reports that she does not drink alcohol and does not use drugs.  She has never smoked.  The patient is alone today.  Allergies:  Allergies  Allergen Reactions   Iodine Shortness Of Breath and Cough    Patient states she had a reaction with dose of contrast in 10/2021   Levofloxacin Rash and Shortness Of Breath   Penicillins Hives and Rash   Sulfa Antibiotics Rash   Tobramycin Other  (See Comments)    Unknown   Betadine [Povidone Iodine] Rash   Ciprofloxacin Itching and Rash    Current Medications: Current Outpatient Medications  Medication Sig Dispense Refill   ADVAIR DISKUS 250-50 MCG/ACT AEPB Inhale 1 puff into the lungs in the morning and at bedtime. 3 each 3   albuterol (VENTOLIN HFA) 108 (90 Base) MCG/ACT inhaler Inhale 2 puffs into the lungs every 6 (six) hours as needed for wheezing or shortness of breath. 8 g 2   amLODipine (NORVASC) 2.5 MG tablet TAKE 1 TABLET BY MOUTH ONCE DAILY 45 tablet 3   aspirin EC 81 MG tablet Take 81 mg by mouth 3 (three) times a week.     azithromycin (ZITHROMAX) 250 MG tablet Use as directed for 5 days 6 each 0   Cholecalciferol (VITAMIN D) 50 MCG (2000 UT) CAPS Take 2,000 Units by mouth daily.     denosumab (PROLIA) 60 MG/ML SOSY injection Inject 60 mg into the skin every 6 (six) months.     levothyroxine (SYNTHROID) 25 MCG tablet TAKE ONE AND ONE-HALF TABLETS DAILY BEFORE BREAKFAST 135 tablet 3   lovastatin (MEVACOR) 20 MG tablet TAKE 1 TABLET BY MOUTH NIGHTLY 90 tablet 2   Multiple Vitamin (MULTI-VITAMINS) TABS Take 1 tablet by mouth daily.     mupirocin nasal ointment (BACTROBAN) 2 % Place 1 application into the nose at bedtime. 10 g 1   Zinc 25 MG TABS Take 25 mg by mouth daily.     tamoxifen (NOLVADEX) 20 MG tablet Take 1 tablet (20 mg total) by mouth daily. 90 tablet 1   No current facility-administered medications for this visit.    Physical Exam: Blood pressure (!) 154/82, pulse 61, temperature (!) 97.3 F (36.3 C), resp. rate 18, weight 97 lb 9.6 oz (44.3 kg).  Physical Exam Constitutional:      General: She is not in acute distress.    Appearance: Normal appearance. She is not diaphoretic.     Comments: Thin built, walks independently.   HENT:     Head: Normocephalic and atraumatic.     Nose: Nose normal.  Mouth/Throat:     Pharynx: No oropharyngeal exudate.  Eyes:     General: No scleral icterus.     Pupils: Pupils are equal, round, and reactive to light.  Cardiovascular:     Rate and Rhythm: Normal rate and regular rhythm.     Heart sounds: No murmur heard. Pulmonary:     Effort: Pulmonary effort is normal. No respiratory distress.  Abdominal:     Palpations: Abdomen is soft.  Musculoskeletal:        General: Normal range of motion.     Cervical back: Normal range of motion and neck supple.  Skin:    General: Skin is dry.     Findings: No rash.  Neurological:     Mental Status: She is alert and oriented to person, place, and time. Mental status is at baseline.     Cranial Nerves: Cranial nerve deficit present.     Motor: No abnormal muscle tone.  Psychiatric:        Mood and Affect: Mood and affect normal.     Lab    Latest Ref Rng & Units 09/06/2022    1:51 PM 06/17/2022   12:22 PM 03/03/2022   12:58 PM  CBC  WBC 4.0 - 10.5 K/uL 6.4  20.9  6.0   Hemoglobin 12.0 - 15.0 g/dL 15.4  14.7  15.5   Hematocrit 36.0 - 46.0 % 47.4  44.4  47.2   Platelets 150 - 400 K/uL 177  182  181       Latest Ref Rng & Units 09/06/2022    1:51 PM 07/28/2022    3:37 PM 06/17/2022   12:22 PM  CMP  Glucose 70 - 99 mg/dL 89  95  117   BUN 8 - 23 mg/dL '22  17  17   '$ Creatinine 0.44 - 1.00 mg/dL 0.84  0.73  0.64   Sodium 135 - 145 mmol/L 137  143  130   Potassium 3.5 - 5.1 mmol/L 4.9  5.0  3.5   Chloride 98 - 111 mmol/L 101  103  98   CO2 22 - 32 mmol/L '28  23  22   '$ Calcium 8.9 - 10.3 mg/dL 9.3  9.7  8.3   Total Protein 6.5 - 8.1 g/dL 7.0  7.2    Total Bilirubin 0.3 - 1.2 mg/dL 0.7  0.3    Alkaline Phos 38 - 126 U/L 33  56    AST 15 - 41 U/L 28  31    ALT 0 - 44 U/L 18  19

## 2022-09-06 NOTE — Assessment & Plan Note (Addendum)
#  Lung nodule found on chest x-ray and confirmed on CT scan, no FDG activity on PET scan. Patient is status post SBRT finished in December 2023 10/30/2021, CT chest with contrast showed radiation changes.  Small left pleural effusion. Future surveillance CT scan scheduled via radiation oncologist office.

## 2022-09-06 NOTE — Progress Notes (Signed)
Pt here for follow up. No new concerns voiced.   

## 2022-09-06 NOTE — Assessment & Plan Note (Addendum)
#   erythrocytosis, presumed to be secondary to sleep apnea.- encourage compliance with CPAP No need for phlebotomy today.

## 2022-09-06 NOTE — Progress Notes (Signed)
No phelb today per MD

## 2022-09-06 NOTE — Assessment & Plan Note (Addendum)
#  Left breast DCIS with microinvasive component, pTMi pN0 Status post lumpectomy.  No sentinel lymph node biopsy was done. S/p RT Labs reviewed and discussed with patient Continue tamoxifen 20 mg daily.  Plan 5 years-- end August 2027 Continue asprin 81mg  daily for thrombosis prophylaxis.  Obtain annual bilateral diagnostic mammogram in March 2024  I recommend annual pelvic examination while on tamoxifen. Follow up annually with gynecologist.

## 2022-09-07 ENCOUNTER — Other Ambulatory Visit: Payer: Self-pay

## 2022-09-07 MED ORDER — TAMOXIFEN CITRATE 20 MG PO TABS: 20.0000 mg | ORAL_TABLET | Freq: Every day | ORAL | 1 refills | Status: DC

## 2022-09-15 ENCOUNTER — Other Ambulatory Visit: Payer: Self-pay | Admitting: Physician Assistant

## 2022-09-15 ENCOUNTER — Telehealth: Payer: Self-pay | Admitting: Physician Assistant

## 2022-09-15 DIAGNOSIS — J4489 Other specified chronic obstructive pulmonary disease: Secondary | ICD-10-CM

## 2022-09-15 MED ORDER — FLUTICASONE-SALMETEROL 250-50 MCG/ACT IN AEPB: 1.0000 | INHALATION_SPRAY | Freq: Two times a day (BID) | RESPIRATORY_TRACT | 3 refills | Status: DC

## 2022-09-21 NOTE — Telephone Encounter (Signed)
error 

## 2022-10-01 DIAGNOSIS — D485 Neoplasm of uncertain behavior of skin: Secondary | ICD-10-CM | POA: Diagnosis not present

## 2022-10-01 DIAGNOSIS — D0462 Carcinoma in situ of skin of left upper limb, including shoulder: Secondary | ICD-10-CM | POA: Diagnosis not present

## 2022-10-06 ENCOUNTER — Ambulatory Visit
Admission: RE | Admit: 2022-10-06 | Discharge: 2022-10-06 | Disposition: A | Discharge status: Home / Self Care | Payer: Medicare Other | Source: Ambulatory Visit | Attending: Oncology | Admitting: Oncology

## 2022-10-06 DIAGNOSIS — C50912 Malignant neoplasm of unspecified site of left female breast: Secondary | ICD-10-CM | POA: Insufficient documentation

## 2022-10-06 DIAGNOSIS — D0512 Intraductal carcinoma in situ of left breast: Secondary | ICD-10-CM

## 2022-10-06 DIAGNOSIS — R922 Inconclusive mammogram: Secondary | ICD-10-CM | POA: Diagnosis not present

## 2022-10-06 DIAGNOSIS — Z79810 Long term (current) use of selective estrogen receptor modulators (SERMs): Secondary | ICD-10-CM | POA: Diagnosis not present

## 2022-11-04 ENCOUNTER — Encounter: Payer: Self-pay | Admitting: Hematology and Oncology

## 2022-11-11 ENCOUNTER — Ambulatory Visit (INDEPENDENT_AMBULATORY_CARE_PROVIDER_SITE_OTHER): Payer: Medicare Other | Admitting: Physician Assistant

## 2022-11-11 ENCOUNTER — Encounter: Payer: Self-pay | Admitting: Hematology and Oncology

## 2022-11-11 ENCOUNTER — Encounter: Payer: Self-pay | Admitting: Physician Assistant

## 2022-11-11 VITALS — BP 138/70 | HR 78 | Temp 98.0°F | Resp 16 | Ht 62.0 in | Wt 97.0 lb

## 2022-11-11 DIAGNOSIS — E039 Hypothyroidism, unspecified: Secondary | ICD-10-CM | POA: Diagnosis not present

## 2022-11-11 DIAGNOSIS — I10 Essential (primary) hypertension: Secondary | ICD-10-CM

## 2022-11-11 DIAGNOSIS — R636 Underweight: Secondary | ICD-10-CM | POA: Diagnosis not present

## 2022-11-11 NOTE — Progress Notes (Signed)
[facility] [address] [place], [place] [address]  Internal MEDICINE  Office Visit Note  Patient Name: Sabrina Zamora  [id number]  [id number]  Date of Service: 11/16/2022  Chief Complaint  Patient presents with   Follow-up   Hyperlipidemia   Hypertension    HPI Pt is here for routine follow up -Goes back to Dr. Cathie Hoops in 2 weeks -Has pulmonary follow up soon  -Had not been using cpap recently after being off of it from PNA. She has started using again now but isnt sure if she will continue. She reports Dr. Cathie Hoops has been monitoring her labs with her cpap use -Did have updated mammogram in March and had follow up on Tamoxifen with GYN -working to gain weight back after PNA, will increase protein  Current Medication: Outpatient Encounter Medications as of 11/11/2022  Medication Sig   albuterol (VENTOLIN HFA) 108 (90 Base) MCG/ACT inhaler Inhale 2 puffs into the lungs every 6 (six) hours as needed for wheezing or shortness of breath.   amLODipine (NORVASC) 2.5 MG tablet TAKE 1 TABLET BY MOUTH ONCE DAILY   aspirin EC 81 MG tablet Take 81 mg by mouth 3 (three) times a week.   Cholecalciferol (VITAMIN D) 50 MCG (2000 UT) CAPS Take 2,000 Units by mouth daily.   denosumab (PROLIA) 60 MG/ML SOSY injection Inject 60 mg into the skin every 6 (six) months.   fluticasone-salmeterol (ADVAIR) 250-50 MCG/ACT AEPB Inhale 1 puff into the lungs in the morning and at bedtime.   levothyroxine (SYNTHROID) 25 MCG tablet TAKE ONE AND ONE-HALF TABLETS DAILY BEFORE BREAKFAST   lovastatin (MEVACOR) 20 MG tablet TAKE 1 TABLET BY MOUTH NIGHTLY   Multiple Vitamin (MULTI-VITAMINS) TABS Take 1 tablet by mouth daily.   mupirocin nasal ointment (BACTROBAN) 2 % Place 1 application into the nose at bedtime.   tamoxifen (NOLVADEX) 20 MG tablet Take 1 tablet (20 mg total) by mouth daily.   Zinc 25 MG TABS Take 25 mg by mouth daily.   [DISCONTINUED] azithromycin (ZITHROMAX) 250 MG tablet Use as directed for 5  days   No facility-administered encounter medications on file as of 11/11/2022.    Surgical History: Past Surgical History:  Procedure Laterality Date   BREAST BIOPSY Left 2003   neg   BREAST BIOPSY Right 09/27/2017   12:00 1 cmfn fibroadenomatous change    BREAST BIOPSY Right 09/17/2017   12:00 3 cmfn benign breast and fibroadipose tissue   BREAST BIOPSY Left 10/14/2020   stereo biopsy, ribbon clip/ INTERMEDIATE GRADE DUCTAL CARCINOMA IN SITU    BREAST EXCISIONAL BIOPSY Left 1988   excisional - neg   BREAST LUMPECTOMY Left 11/25/2020   BREAST LUMPECTOMY WITH RADIOFREQUENCY TAG IDENTIFICATION Left 11/25/2020   Procedure: BREAST LUMPECTOMY WITH RADIOFREQUENCY TAG IDENTIFICATION;  Surgeon: Leafy Ro, MD;  Location: [facility] OR[facility];  Service: General;  Laterality: Left;   CATARACT EXTRACTION W/PHACO Left 09/20/2017   Procedure: CATARACT EXTRACTION PHACO AND INTRAOCULAR LENS PLACEMENT (IOC) LEFT;  Surgeon: Lockie Mola, MD;  Location: [facility];  Service: Ophthalmology;  Laterality: Left;   CATARACT EXTRACTION W/PHACO Right 02/15/2018   Procedure: CATARACT EXTRACTION PHACO AND INTRAOCULAR LENS PLACEMENT (IOC) TOPICAL  RIGHT;  Surgeon: Lockie Mola, MD;  Location: [facility];  Service: Ophthalmology;  Laterality: Right;  prefers early   CHOLECYSTECTOMY     EYE SURGERY      Medical History: Past Medical History:  Diagnosis Date   Asthma    GERD (gastroesophageal reflux disease)  High cholesterol    Hypertension    Hypothyroidism    Melanoma (HCC) 1986, 2008, 2016, 2021   melenoma left arm, left leg, back, back respectively   Osteoporosis    Polycythemia vera (HCC)    Skin cancer 2012, 2016   Sleep apnea     Family History: Family History  Problem Relation Age of Onset   Clotting disorder Sister    Breast cancer Sister 71   Heart disease Mother    Heart disease Father     Social History   Socioeconomic History   Marital status:  Married    Spouse name: Chrissie Noa   Number of children: 3   Years of education: Not on file   Highest education level: Not on file  Occupational History   Not on file  Tobacco Use   Smoking status: Never   Smokeless tobacco: Never  Vaping Use   Vaping Use: Never used  Substance and Sexual Activity   Alcohol use: Never    Alcohol/week: 0.0 standard drinks of alcohol   Drug use: Never   Sexual activity: Not on file  Other Topics Concern   Not on file  Social History Narrative   Not on file   Social Determinants of Health   Financial Resource Strain: Not on file  Food Insecurity: Not on file  Transportation Needs: Not on file  Physical Activity: Not on file  Stress: Not on file  Social Connections: Not on file  Intimate Partner Violence: Not on file      Review of Systems  Constitutional:  Negative for chills, fatigue and unexpected weight change.  HENT:  Negative for congestion, rhinorrhea, sneezing and sore throat.   Eyes:  Negative for redness.  Respiratory:  Negative for cough, chest tightness and shortness of breath.   Cardiovascular:  Negative for chest pain and palpitations.  Gastrointestinal:  Negative for abdominal pain, constipation, diarrhea, nausea and vomiting.  Genitourinary:  Negative for dysuria and frequency.  Musculoskeletal:  Negative for arthralgias, back pain, joint swelling and neck pain.  Skin:  Negative for rash.  Neurological: Negative.  Negative for tremors and numbness.  Hematological:  Negative for adenopathy. Does not bruise/bleed easily.  Psychiatric/Behavioral:  Negative for behavioral problems (Depression), sleep disturbance and suicidal ideas. The patient is not nervous/anxious.     Vital Signs: BP 138/70   Pulse 78   Temp 98 F (36.7 C)   Resp 16   Ht 5\' 2"  (1.575 m)   Wt 97 lb (44 kg)   SpO2 99%   BMI 17.74 kg/m    Physical Exam Vitals and nursing note reviewed.  Constitutional:      Appearance: Normal appearance. She is  normal weight.  HENT:     Head: Normocephalic and atraumatic.  Eyes:     Pupils: Pupils are equal, round, and reactive to light.  Cardiovascular:     Rate and Rhythm: Normal rate and regular rhythm.     Pulses: Normal pulses.     Heart sounds: Normal heart sounds.  Pulmonary:     Effort: Pulmonary effort is normal.     Breath sounds: Normal breath sounds.  Abdominal:     General: Abdomen is flat.     Palpations: Abdomen is soft.  Musculoskeletal:        General: Normal range of motion.     Cervical back: Normal range of motion.     Right lower leg: No edema.     Left lower leg: No edema.  Skin:    General: Skin is warm.  Neurological:     General: No focal deficit present.     Mental Status: She is alert and oriented to person, place, and time. Mental status is at baseline.  Psychiatric:        Mood and Affect: Mood normal.        Behavior: Behavior normal.        Thought Content: Thought content normal.        Judgment: Judgment normal.        Assessment/Plan: 1. Essential hypertension Improved, continue current medication  2. Hypothyroidism, adult Stable, continue synthroid as before  3. Underweight Lost weight when she had PNA and working to gain back. Continue to work on increasing protein/caloric intake.   General Counseling: Lechelle verbalizes understanding of the findings of todays visit and agrees with plan of treatment. I have discussed any further diagnostic evaluation that may be needed or ordered today. We also reviewed her medications today. she has been encouraged to call the office with any questions or concerns that should arise related to todays visit.    No orders of the defined types were placed in this encounter.   No orders of the defined types were placed in this encounter.   This patient was seen by Lynn Ito, PA-C in collaboration with Dr. Beverely Risen as a part of collaborative care agreement.   Total time spent:30 Minutes Time  spent includes review of chart, medications, test results, and follow up plan with the patient.      Dr Lyndon Code Internal medicine

## 2022-11-17 ENCOUNTER — Ambulatory Visit: Payer: Medicare Other | Admitting: Surgery

## 2022-11-17 DIAGNOSIS — D0462 Carcinoma in situ of skin of left upper limb, including shoulder: Secondary | ICD-10-CM | POA: Diagnosis not present

## 2022-11-19 DIAGNOSIS — D2261 Melanocytic nevi of right upper limb, including shoulder: Secondary | ICD-10-CM | POA: Diagnosis not present

## 2022-11-19 DIAGNOSIS — Z85820 Personal history of malignant melanoma of skin: Secondary | ICD-10-CM | POA: Diagnosis not present

## 2022-11-19 DIAGNOSIS — D2271 Melanocytic nevi of right lower limb, including hip: Secondary | ICD-10-CM | POA: Diagnosis not present

## 2022-11-19 DIAGNOSIS — D2262 Melanocytic nevi of left upper limb, including shoulder: Secondary | ICD-10-CM | POA: Diagnosis not present

## 2022-11-19 DIAGNOSIS — D2272 Melanocytic nevi of left lower limb, including hip: Secondary | ICD-10-CM | POA: Diagnosis not present

## 2022-11-19 DIAGNOSIS — D1801 Hemangioma of skin and subcutaneous tissue: Secondary | ICD-10-CM | POA: Diagnosis not present

## 2022-11-23 ENCOUNTER — Ambulatory Visit
Admission: RE | Admit: 2022-11-23 | Discharge: 2022-11-23 | Disposition: A | Discharge status: Home / Self Care | Payer: Medicare Other | Source: Ambulatory Visit | Attending: Radiation Oncology | Admitting: Radiation Oncology

## 2022-11-23 DIAGNOSIS — C50412 Malignant neoplasm of upper-outer quadrant of left female breast: Secondary | ICD-10-CM | POA: Diagnosis not present

## 2022-11-23 DIAGNOSIS — Z170 Estrogen receptor positive status [ER+]: Secondary | ICD-10-CM | POA: Insufficient documentation

## 2022-11-23 DIAGNOSIS — J479 Bronchiectasis, uncomplicated: Secondary | ICD-10-CM | POA: Diagnosis not present

## 2022-11-23 DIAGNOSIS — C3490 Malignant neoplasm of unspecified part of unspecified bronchus or lung: Secondary | ICD-10-CM | POA: Diagnosis not present

## 2022-11-24 ENCOUNTER — Ambulatory Visit
Admission: RE | Admit: 2022-11-24 | Discharge: 2022-11-24 | Disposition: A | Discharge status: Home / Self Care | Payer: Medicare Other | Source: Ambulatory Visit | Attending: Radiation Oncology | Admitting: Radiation Oncology

## 2022-11-24 VITALS — BP 165/99 | HR 81 | Temp 96.3°F | Resp 24 | Wt 96.3 lb

## 2022-11-24 DIAGNOSIS — R911 Solitary pulmonary nodule: Secondary | ICD-10-CM | POA: Diagnosis not present

## 2022-11-24 NOTE — Progress Notes (Signed)
Radiation Oncology Follow up Note  Name: Sabrina Zamora   Date:   11/24/2022 MRN:  [id number] DOB: 1931-01-08    This 89 y.o. female presents to the clinic today for 77-month follow-up status post SBRT to her left lower lobe for stage I non-small cell lung cancer and previously treated back in 22 for DCIS of the breast.  REFERRING PROVIDER: Carlean Jews, PA*  HPI: Patient is a 89 year old female now out 60 months having completed SBRT to her left lower lobe for stage I non-small cell lung cancer.  Seen today in routine follow-up she is doing well.  She specifically denies cough hemoptysis chest tightness or any change in her pulmonary status.  Most recent CT scan of her chest.  Shows stable postradiation scarring in the left perihilar region no evidence of recurrent or progressive disease.  COMPLICATIONS OF TREATMENT: none  FOLLOW UP COMPLIANCE: keeps appointments   PHYSICAL EXAM:  BP (!) 165/99 (BP Location: Right Arm, Patient Position: Sitting, Cuff Size: Small)   Pulse 81   Temp (!) 96.3 F (35.7 C) (Tympanic)   Resp (!) 24   Wt 96 lb 4.8 oz (43.7 kg)   SpO2 99%   BMI 17.61 kg/m  Well-developed well-nourished patient in NAD. HEENT reveals PERLA, EOMI, discs not visualized.  Oral cavity is clear. No oral mucosal lesions are identified. Neck is clear without evidence of cervical or supraclavicular adenopathy. Lungs are clear to A&P. Cardiac examination is essentially unremarkable with regular rate and rhythm without murmur rub or thrill. Abdomen is benign with no organomegaly or masses noted. Motor sensory and DTR levels are equal and symmetric in the upper and lower extremities. Cranial nerves II through XII are grossly intact. Proprioception is intact. No peripheral adenopathy or edema is identified. No motor or sensory levels are noted. Crude visual fields are within normal range.  RADIOLOGY RESULTS: CT scans reviewed compatible with above-stated findings  PLAN: Present time  patient is doing well with no significant change on CT scan showing no progression of her lung cancer.  I am pleased with her overall progress.  She continues close follow-up care with medical oncology.  I have asked to see her back in 1 year for follow-up.  Patient is to call with any concerns.  I would like to take this opportunity to thank you for allowing me to participate in the care of your patient.Carmina Miller, MD

## 2022-12-06 ENCOUNTER — Encounter: Payer: Self-pay | Admitting: Hematology and Oncology

## 2022-12-13 ENCOUNTER — Ambulatory Visit (INDEPENDENT_AMBULATORY_CARE_PROVIDER_SITE_OTHER): Payer: Medicare Other | Admitting: Surgery

## 2022-12-13 ENCOUNTER — Encounter: Payer: Self-pay | Admitting: Surgery

## 2022-12-13 VITALS — BP 133/76 | HR 84 | Temp 98.1°F | Ht 62.5 in | Wt 96.4 lb

## 2022-12-13 DIAGNOSIS — D0512 Intraductal carcinoma in situ of left breast: Secondary | ICD-10-CM | POA: Diagnosis not present

## 2022-12-13 NOTE — Patient Instructions (Signed)
If you have any concerns or questions, please feel free to call our office.   Breast Self-Awareness Breast self-awareness is knowing how your breasts look and feel. You need to: Check your breasts on a regular basis. Tell your doctor about any changes. Become familiar with the look and feel of your breasts. This can help you catch a breast problem while it is still small and can be treated. You should do breast self-exams even if you have breast implants. What you need: A mirror. A well-lit room. A pillow or other soft object. How to do a breast self-exam Follow these steps to do a breast self-exam: Look for changes  Take off all the clothes above your waist. Stand in front of a mirror in a room with good lighting. Put your hands down at your sides. Compare your breasts in the mirror. Look for any difference between them, such as: A difference in shape. A difference in size. Wrinkles, dips, and bumps in one breast and not the other. Look at each breast for changes in the skin, such as: Redness. Scaly areas. Skin that has gotten thicker. Dimpling. Open sores (ulcers). Look for changes in your nipples, such as: Fluid coming out of a nipple. Fluid around a nipple. Bleeding. Dimpling. Redness. A nipple that looks pushed in (retracted), or that has changed position. Feel for changes Lie on your back. Feel each breast. To do this: Pick a breast to feel. Place a pillow under the shoulder closest to that breast. Put the arm closest to that breast behind your head. Feel the nipple area of that breast using the hand of your other arm. Feel the area with the pads of your three middle fingers by making small circles with your fingers. Use light, medium, and firm pressure. Continue the overlapping circles, moving downward over the breast. Keep making circles with your fingers. Stop when you feel your ribs. Start making circles with your fingers again, this time going upward until you  reach your collarbone. Then, make circles outward across your breast and into your armpit area. Squeeze your nipple. Check for discharge and lumps. Repeat these steps to check your other breast. Sit or stand in the tub or shower. With soapy water on your skin, feel each breast the same way you did when you were lying down. Write down what you find Writing down what you find can help you remember what to tell your doctor. Write down: What is normal for each breast. Any changes you find in each breast. These include: The kind of changes you find. A tender or painful breast. Any lump you find. Write down its size and where it is. When you last had your monthly period (menstrual cycle). General tips If you are breastfeeding, the best time to check your breasts is after you feed your baby or after you use a breast pump. If you get monthly bleeding, the best time to check your breasts is 5-7 days after your monthly cycle ends. With time, you will become comfortable with the self-exam. You will also start to know if there are changes in your breasts. Contact a doctor if: You see a change in the shape or size of your breasts or nipples. You see a change in the skin of your breast or nipples, such as red or scaly skin. You have fluid coming from your nipples that is not normal. You find a new lump or thick area. You have breast pain. You have any concerns about your  breast health. Summary Breast self-awareness includes looking for changes in your breasts and feeling for changes within your breasts. You should do breast self-awareness in front of a mirror in a well-lit room. If you get monthly periods (menstrual cycles), the best time to check your breasts is 5-7 days after your period ends. Tell your doctor about any changes you see in your breasts. Changes include changes in size, changes on the skin, painful or tender breasts, or fluid from your nipples that is not normal. This information is  not intended to replace advice given to you by your health care provider. Make sure you discuss any questions you have with your health care provider. Document Revised: 12/03/2021 Document Reviewed: 04/30/2021 Elsevier Patient Education  2024 [employer].

## 2022-12-15 NOTE — Progress Notes (Signed)
Outpatient Surgical Follow Up  12/15/2022  Sabrina Zamora is an 89 y.o. female.   Chief Complaint  Patient presents with   Follow-up    1 yr f/u with mammo    HPI:  Sabrina Zamora is an 89 year old female status post lumpectomy 11/2020 for DCIS. She did receive adjuvant XRT  Evidence of microinvasion and patient declined sentinel node biopsy.  She is doing very well.  She recently had a mammogram that have personally reviewed showing no evidence of suspicious lesions on either breast. She als had lund lesion that was radiated. Tolerating tamoxifen   Past Medical History:  Diagnosis Date   Asthma    GERD (gastroesophageal reflux disease)    High cholesterol    Hypertension    Hypothyroidism    Melanoma (HCC) 1986, 2008, 2016, 2021   melenoma left arm, left leg, back, back respectively   Osteoporosis    Polycythemia vera (HCC)    Skin cancer 2012, 2016   Sleep apnea     Past Surgical History:  Procedure Laterality Date   BREAST BIOPSY Left 2003   neg   BREAST BIOPSY Right 09/27/2017   12:00 1 cmfn fibroadenomatous change    BREAST BIOPSY Right 09/17/2017   12:00 3 cmfn benign breast and fibroadipose tissue   BREAST BIOPSY Left 10/14/2020   stereo biopsy, ribbon clip/ INTERMEDIATE GRADE DUCTAL CARCINOMA IN SITU    BREAST EXCISIONAL BIOPSY Left 1988   excisional - neg   BREAST LUMPECTOMY Left 11/25/2020   BREAST LUMPECTOMY WITH RADIOFREQUENCY TAG IDENTIFICATION Left 11/25/2020   Procedure: BREAST LUMPECTOMY WITH RADIOFREQUENCY TAG IDENTIFICATION;  Surgeon: Leafy Ro, MD;  Location: [facility];  Service: General;  Laterality: Left;   CATARACT EXTRACTION W/PHACO Left 09/20/2017   Procedure: CATARACT EXTRACTION PHACO AND INTRAOCULAR LENS PLACEMENT (IOC) LEFT;  Surgeon: Lockie Mola, MD;  Location: [facility];  Service: Ophthalmology;  Laterality: Left;   CATARACT EXTRACTION W/PHACO Right 02/15/2018   Procedure: CATARACT EXTRACTION PHACO AND INTRAOCULAR LENS  PLACEMENT (IOC) TOPICAL  RIGHT;  Surgeon: Lockie Mola, MD;  Location: [facility];  Service: Ophthalmology;  Laterality: Right;  prefers early   CHOLECYSTECTOMY     EYE SURGERY      Family History  Problem Relation Age of Onset   Clotting disorder Sister    Breast cancer Sister 43   Heart disease Mother    Heart disease Father     Social History:  reports that she has never smoked. She has never used smokeless tobacco. She reports that she does not drink alcohol and does not use drugs.  Allergies:  Allergies  Allergen Reactions   Iodine Shortness Of Breath and Cough    Patient states she had a reaction with dose of contrast in 10/2021   Levofloxacin Rash and Shortness Of Breath   Penicillins Hives and Rash   Sulfa Antibiotics Rash   Tobramycin Other (See Comments)    Unknown   Betadine [Povidone Iodine] Rash   Ciprofloxacin Itching and Rash    Medications reviewed.    ROS Full ROS performed and is otherwise negative other than what is stated in HPI   BP 133/76   Pulse 84   Temp 98.1 F (36.7 C) (Oral)   Ht 5' 2.5" (1.588 m)   Wt 96 lb 6.4 oz (43.7 kg)   SpO2 97%   BMI 17.35 kg/m   Physical Exam  Physical Exam Vitals and nursing note reviewed. Exam conducted with a chaperone present.  Constitutional:  General: She is not in acute distress.    Appearance: Normal appearance. She is normal weight. She is not ill-appearing.  Eyes:     General: No scleral icterus.       Right eye: No discharge.        Left eye: No discharge.  Cardiovascular:     Rate and Rhythm: Normal rate and regular rhythm.     Heart sounds: No murmur heard.   No friction rub.  Pulmonary:     Effort: Pulmonary effort is normal. No respiratory distress.     Breath sounds: Normal breath sounds. No stridor. No wheezing.     Comments: Breast: Evidence of prior lumpectomy in the left side there is no evidence of any significant deformity on the left breast or any new  palpable lesions.  There is no evidence of lymphadenopathy on either side.  Evidence of radiation changes. Abdominal:     General: Abdomen is flat. There is no distension.     Palpations: There is no mass.     Tenderness: There is no abdominal tenderness. There is no guarding or rebound.     Hernia: No hernia is present.  Musculoskeletal:        General: No swelling, tenderness or deformity. Normal range of motion.     Cervical back: Normal range of motion and neck supple. No rigidity or tenderness.  Lymphadenopathy:     Cervical: No cervical adenopathy.  Skin:    General: Skin is warm and dry.     Capillary Refill: Capillary refill takes less than 2 seconds.     Coloration: Skin is not jaundiced or pale.     Findings: No bruising or erythema.  Neurological:     General: No focal deficit present.     Mental Status: She is alert and oriented to person, place, and time.  Psychiatric:        Mood and Affect: Mood normal.        Behavior: Behavior normal.        Thought Content: Thought content normal.        Judgment: Judgment normal.   Assessment/Plan: 89 year old female with history of DCIS in the left breast status postlumpectomy and radiation therapy.  No evidence of recurrence. No evidence of complications or new breast issues.  I will see her in 1 year with repeat mammogram.  I spent Greater than 30 minutes including personally reviewing imaging studies, placing orders, counseling the patient and performing appropriate documentation  Sterling Big, MD [facility] General Surgeon

## 2023-01-25 ENCOUNTER — Other Ambulatory Visit: Payer: Self-pay | Admitting: Physician Assistant

## 2023-01-27 DIAGNOSIS — M810 Age-related osteoporosis without current pathological fracture: Secondary | ICD-10-CM | POA: Diagnosis not present

## 2023-02-23 ENCOUNTER — Other Ambulatory Visit: Payer: Self-pay | Admitting: Physician Assistant

## 2023-02-28 ENCOUNTER — Encounter: Payer: Self-pay | Admitting: Hematology and Oncology

## 2023-03-04 ENCOUNTER — Other Ambulatory Visit: Payer: Self-pay

## 2023-03-04 DIAGNOSIS — C50912 Malignant neoplasm of unspecified site of left female breast: Secondary | ICD-10-CM

## 2023-03-04 DIAGNOSIS — Z79810 Long term (current) use of selective estrogen receptor modulators (SERMs): Secondary | ICD-10-CM

## 2023-03-07 ENCOUNTER — Inpatient Hospital Stay: Payer: Medicare Other

## 2023-03-07 ENCOUNTER — Encounter: Payer: Self-pay | Admitting: Oncology

## 2023-03-07 ENCOUNTER — Inpatient Hospital Stay: Payer: Medicare Other | Admitting: Oncology

## 2023-03-07 VITALS — BP 150/79 | HR 78 | Temp 97.6°F | Resp 18 | Wt 98.4 lb

## 2023-03-07 DIAGNOSIS — Z170 Estrogen receptor positive status [ER+]: Secondary | ICD-10-CM | POA: Diagnosis not present

## 2023-03-07 DIAGNOSIS — C50912 Malignant neoplasm of unspecified site of left female breast: Secondary | ICD-10-CM

## 2023-03-07 DIAGNOSIS — J90 Pleural effusion, not elsewhere classified: Secondary | ICD-10-CM

## 2023-03-07 DIAGNOSIS — Z923 Personal history of irradiation: Secondary | ICD-10-CM | POA: Insufficient documentation

## 2023-03-07 DIAGNOSIS — D0512 Intraductal carcinoma in situ of left breast: Secondary | ICD-10-CM

## 2023-03-07 DIAGNOSIS — D751 Secondary polycythemia: Secondary | ICD-10-CM | POA: Insufficient documentation

## 2023-03-07 DIAGNOSIS — G4730 Sleep apnea, unspecified: Secondary | ICD-10-CM

## 2023-03-07 DIAGNOSIS — Z79899 Other long term (current) drug therapy: Secondary | ICD-10-CM

## 2023-03-07 DIAGNOSIS — R911 Solitary pulmonary nodule: Secondary | ICD-10-CM

## 2023-03-07 DIAGNOSIS — Z79810 Long term (current) use of selective estrogen receptor modulators (SERMs): Secondary | ICD-10-CM

## 2023-03-07 LAB — CMP (CANCER CENTER ONLY)
ALT: 21 U/L (ref 0–44)
AST: 27 U/L (ref 15–41)
Albumin: 3.8 g/dL (ref 3.5–5.0)
Alkaline Phosphatase: 38 U/L (ref 38–126)
Anion gap: 7 (ref 5–15)
BUN: 22 mg/dL (ref 8–23)
CO2: 26 mmol/L (ref 22–32)
Calcium: 9.1 mg/dL (ref 8.9–10.3)
Chloride: 103 mmol/L (ref 98–111)
Creatinine: 0.83 mg/dL (ref 0.44–1.00)
GFR, Estimated: >60 mL/min (ref >60)
Glucose, Bld: 93 mg/dL (ref 70–99)
Potassium: 3.9 mmol/L (ref 3.5–5.1)
Sodium: 136 mmol/L (ref 135–145)
Total Bilirubin: 0.6 mg/dL (ref 0.3–1.2)
Total Protein: 7.1 g/dL (ref 6.5–8.1)

## 2023-03-07 LAB — CBC (CANCER CENTER ONLY)
HCT: 48.7 % — ABNORMAL HIGH (ref 36.0–46.0)
Hemoglobin: 16.2 g/dL — ABNORMAL HIGH (ref 12.0–15.0)
MCH: 32 pg (ref 26.0–34.0)
MCHC: 33.3 g/dL (ref 30.0–36.0)
MCV: 96.1 fL (ref 80.0–100.0)
Platelet Count: 184 10*3/uL (ref 150–400)
RBC: 5.07 MIL/uL (ref 3.87–5.11)
RDW: 13.1 % (ref 11.5–15.5)
WBC Count: 6.5 10*3/uL (ref 4.0–10.5)
nRBC: 0 % (ref 0.0–0.2)

## 2023-03-07 NOTE — Progress Notes (Signed)
Hematology/Oncology Progress note [phone] [phone]    Chief Complaint: secondary polycythemia, lung nodule [presumed lung cancer], left breast DCIS with microinvasive component.   ASSESSMENT & PLAN:   Ductal carcinoma in situ (DCIS) of left breast with microinvasive component (HCC) #Left breast DCIS with microinvasive component, pTMi pN0 Status post lumpectomy.  No sentinel lymph node biopsy was done. S/p RT Labs reviewed and discussed with patient Continue tamoxifen 20 mg daily.  Plan 5 years-- end August 2027 Continue asprin 81mg  daily for thrombosis prophylaxis.  annual bilateral diagnostic mammogram in March 2025 I recommend annual pelvic examination while on tamoxifen. Follow up annually with gynecologist.  Erythrocytosis # erythrocytosis, presumed to be secondary to sleep apnea.- encourage compliance with CPAP No need for phlebotomy today.    Lung nodule #Lung nodule found on chest x-ray and confirmed on CT scan, no FDG activity on PET scan. Patient is status post SBRT finished in December 2023 10/30/2021, CT chest with contrast showed radiation changes.  Small left pleural effusion. Future surveillance CT scan scheduled via radiation oncologist office.  Orders Placed This Encounter  Procedures   MM 3D DIAGNOSTIC MAMMOGRAM BILATERAL BREAST    Standing Status:   Future    Standing Expiration Date:   03/06/2024    Order Specific Question:   Reason for Exam (SYMPTOM  OR DIAGNOSIS REQUIRED)    Answer:   Breast cancer    Order Specific Question:   Preferred imaging location?    Answer:   [facility]   CBC with Differential (Cancer Center Only)    Standing Status:   Future    Standing Expiration Date:   03/06/2024   CMP (Cancer Center only)    Standing Status:   Future    Standing Expiration Date:   03/06/2024   Follow up in 6 months.  All questions were answered. The patient knows to call the clinic with any problems, questions or  concerns.  [name], MD, PhD [facility] Hematology Oncology 03/07/2023   Ductal carcinoma in situ (DCIS) of left breast with microinvasive component (HCC) #Left breast DCIS with microinvasive component, pTMi pN0 Status post lumpectomy.  No sentinel lymph node biopsy was done. S/p RT Labs reviewed and discussed with patient Continue tamoxifen 20 mg daily.  Plan 5 years-- end August 2027 Continue asprin 81mg  daily for thrombosis prophylaxis.  Obtain annual bilateral diagnostic mammogram in March 2024  I recommend annual pelvic examination while on tamoxifen. Follow up annually with gynecologist.  Erythrocytosis # erythrocytosis, presumed to be secondary to sleep apnea.- encourage compliance with CPAP No need for phlebotomy today.    Lung nodule #Lung nodule found on chest x-ray and confirmed on CT scan, no FDG activity on PET scan. Patient is status post SBRT finished in December 2023 10/30/2021, CT chest with contrast showed radiation changes.  Small left pleural effusion. Future surveillance CT scan scheduled via radiation oncologist office.  No orders of the defined types were placed in this encounter.   All questions were answered. The patient knows to call the clinic with any problems, questions or concerns.  [name], MD, PhD [facility] Hematology Oncology 03/07/2023   HPI:  Patient previously followed up with Dr. Merlene Pulling.  Patient switched care to me on 11/29/2018. Extensive chart review was performed.  #Secondary polycythemia Patient has a history of sleep apnea she wears oxygen at night but no CPAP mask.  Never smoker. Work-up on 01/11/2012 reviewed and negative Jak 2 V617F mutation, exon 12 mutation. 08/25/2018  CALR negative, JAK 2 E12-15 negative, MPL negative.  Patient has been on phlebotomy program to keep hematocrit less than 48 and then later to keep it less than 50.  #History of melanoma x3.  T1 a melanoma in August 2012. saw Dermatology and had topical  chemotherapy treatment to her forehead lesion.   #She has been compliant with CPAP machine. She did nocturnal oxygen testing and she is not qualified for nocturnal oxygen.   # Lung nodule -presumed lung cancer. She is a never smoker.  Report second hand smoke exposure when she grew up and during the initial few years of her marriage.    Her husband stopped smoking in 1960s.   # 06/26/2020, CT without contrast showed a partial solid nodule within the superior segment of the left lower pole.  This measures 2.4 cm with a solid component measuring 5 mm.  Bronchiectasis with tree-in-bud nodularity is noted within the anterior and posterior basal portions of the right upper lobe.  10/06/2020 CT chest wo contrast - mixed solid and ground-glass nodule of  the anterior superior segment left lower lobe, with tenting of the adjacent fissural pleura. The overall dimensions are approximately 2.8 x 2.4 cm, previously 2.6 x 2.3 cm when measured similarly, with nodular components measuring approximately 0.5 cm and 0.7 cm. Central nodular components appear somewhat more solid than on comparison prior although are not substantially changed in size a new 5 mm pulmonary nodule of the lateral segment right middle lobe, nonspecific.Stable 4 mm nodule of the anterior right lower lobe, which remains nonspecific.4. There are numerous additional unchanged clustered and tree-in-bud centrilobular nodules, particularly in the posterior right upper lobe. 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  Patient's case was discussed on tumor board on 10/15/2020.  Consensus reached about proceed with biopsy versus PET scan+ empiric SBRT given her age. 10/28/2020, PET scan did not show activity of the left lower lobe nodule.  However her tumor board discussion, CT appearance is indicated for of lung cancer.  Patient was referred to establish care with Dr. Rushie Chestnut radiation oncology and was evaluated on  11/03/2020.  RadOnc recommend watchful waiting for the time being.  Should the nodule continues to solidify and grow patient will be offered empirically SBRT at that time.  05/04/2021, CT chest showed continued slow growth of left lower lobe lung lesion. 06/23/2021, patient finished SBRT to left lower lung lesion.   Left breast DCIS with microinvasive component 09/25/20 screening mammogram showed possible distortion and calcifications in the left breast.  10/08/2020 left diagnostic mammogram showed Grouped microcalcifications in the left breast, Four circumscribed hypoechoic masses in the left breast may represent complicated cysts and are probably benign.  10/14/2020 left breast upper outer quadrant at middle depth-- intermediate grade DCIS, 5mm, with microcalcifications.  11/25/2020, patient underwent left lumpectomy by Dr. Everlene Farrier There is microinvasive mammary carcinoma with high-grade DCIS.  Negative margins.  pTmi pNx Given her advanced age, multiple other medical conditions, strongly ER positivity, informed decision was made to omit sentinel lymph node biopsy. Case was discussed on breast cancer tumor board.  01/14/2021 finished radiation.  02/03/2021 started on tamoxifen  INTERVAL HISTORY MYAMI LUGARDO is a 89 y.o. female who has above history reviewed by me today presents for follow up visit for erythrocytosis, lung nodule, left breast DCIS with microinvasive component Patient tolerates tamoxifen well.  No new breast concerns. Does not use CPAP daily.    Review of Systems  Constitutional:  Negative for appetite change,  chills, fatigue and fever.  HENT:   Negative for hearing loss and voice change.   Eyes:  Negative for eye problems.  Respiratory:  Negative for chest tightness and cough.   Cardiovascular:  Negative for chest pain.  Gastrointestinal:  Negative for abdominal distention, abdominal pain and blood in stool.  Endocrine: Negative for hot flashes.  Genitourinary:  Negative for  difficulty urinating and frequency.   Musculoskeletal:  Negative for arthralgias.  Skin:  Negative for itching and rash.  Neurological:  Negative for extremity weakness.  Hematological:  Negative for adenopathy.  Psychiatric/Behavioral:  Negative for confusion.     Past Medical History:  Diagnosis Date   Asthma    GERD (gastroesophageal reflux disease)    High cholesterol    Hypertension    Hypothyroidism    Melanoma (HCC) 1986, 2008, 2016, 2021   melenoma left arm, left leg, back, back respectively   Osteoporosis    Polycythemia vera (HCC)    Skin cancer 2012, 2016   Sleep apnea     Past Surgical History:  Procedure Laterality Date   BREAST BIOPSY Left 2003   neg   BREAST BIOPSY Right 09/27/2017   12:00 1 cmfn fibroadenomatous change    BREAST BIOPSY Right 09/17/2017   12:00 3 cmfn benign breast and fibroadipose tissue   BREAST BIOPSY Left 10/14/2020   stereo biopsy, ribbon clip/ INTERMEDIATE GRADE DUCTAL CARCINOMA IN SITU    BREAST EXCISIONAL BIOPSY Left 1988   excisional - neg   BREAST LUMPECTOMY Left 11/25/2020   BREAST LUMPECTOMY WITH RADIOFREQUENCY TAG IDENTIFICATION Left 11/25/2020   Procedure: BREAST LUMPECTOMY WITH RADIOFREQUENCY TAG IDENTIFICATION;  Surgeon: Leafy Ro, MD;  Location: [facility] ORS;  Service: General;  Laterality: Left;   CATARACT EXTRACTION W/PHACO Left 09/20/2017   Procedure: CATARACT EXTRACTION PHACO AND INTRAOCULAR LENS PLACEMENT (IOC) LEFT;  Surgeon: Lockie Mola, MD;  Location: [facility];  Service: Ophthalmology;  Laterality: Left;   CATARACT EXTRACTION W/PHACO Right 02/15/2018   Procedure: CATARACT EXTRACTION PHACO AND INTRAOCULAR LENS PLACEMENT (IOC) TOPICAL  RIGHT;  Surgeon: Lockie Mola, MD;  Location: [facility];  Service: Ophthalmology;  Laterality: Right;  prefers early   CHOLECYSTECTOMY     EYE SURGERY      Family History  Problem Relation Age of Onset   Clotting disorder Sister    Breast  cancer Sister 82   Heart disease Mother    Heart disease Father     Social History:  reports that she has never smoked. She has never used smokeless tobacco. She reports that she does not drink alcohol and does not use drugs.  She has never smoked.  The patient is alone today.  Allergies:  Allergies  Allergen Reactions   Iodine Shortness Of Breath and Cough    Patient states she had a reaction with dose of contrast in 10/2021   Levofloxacin Rash and Shortness Of Breath   Penicillins Hives and Rash   Sulfa Antibiotics Rash   Tobramycin Other (See Comments)    Unknown   Betadine [Povidone Iodine] Rash   Ciprofloxacin Itching and Rash    Current Medications: Current Outpatient Medications  Medication Sig Dispense Refill   albuterol (VENTOLIN HFA) 108 (90 Base) MCG/ACT inhaler Inhale 2 puffs into the lungs every 6 (six) hours as needed for wheezing or shortness of breath. 8 g 2   amLODipine (NORVASC) 2.5 MG tablet TAKE ONE TABLET (2.5 MG) BY MOUTH EVERY DAY 45 tablet 3   aspirin EC 81  MG tablet Take 81 mg by mouth 3 (three) times a week.     Cholecalciferol (VITAMIN D) 50 MCG (2000 UT) CAPS Take 2,000 Units by mouth daily.     denosumab (PROLIA) 60 MG/ML SOSY injection Inject 60 mg into the skin every 6 (six) months.     fluticasone-salmeterol (ADVAIR) 250-50 MCG/ACT AEPB Inhale 1 puff into the lungs in the morning and at bedtime. 3 each 3   levothyroxine (SYNTHROID) 25 MCG tablet TAKE ONE AND ONE-HALF TABLETS DAILY BEFORE BREAKFAST 135 tablet 3   lovastatin (MEVACOR) 20 MG tablet TAKE 1 TABLET BY MOUTH NIGHTLY 90 tablet 2   Multiple Vitamin (MULTI-VITAMINS) TABS Take 1 tablet by mouth daily.     mupirocin nasal ointment (BACTROBAN) 2 % Place 1 application into the nose at bedtime. 10 g 1   tamoxifen (NOLVADEX) 20 MG tablet Take 1 tablet (20 mg total) by mouth daily. 90 tablet 1   Zinc 25 MG TABS Take 25 mg by mouth daily.     No current facility-administered medications for this  visit.    Physical Exam: Blood pressure (!) 150/79, pulse 78, temperature 97.6 F (36.4 C), temperature source Tympanic, resp. rate 18, weight 98 lb 6.4 oz (44.6 kg), SpO2 99%.  Physical Exam Constitutional:      General: She is not in acute distress.    Appearance: Normal appearance. She is not diaphoretic.     Comments: Thin built, walks independently.   HENT:     Head: Normocephalic and atraumatic.     Nose: Nose normal.     Mouth/Throat:     Pharynx: No oropharyngeal exudate.  Eyes:     General: No scleral icterus.    Pupils: Pupils are equal, round, and reactive to light.  Cardiovascular:     Rate and Rhythm: Normal rate and regular rhythm.     Heart sounds: No murmur heard. Pulmonary:     Effort: Pulmonary effort is normal. No respiratory distress.  Abdominal:     Palpations: Abdomen is soft.  Musculoskeletal:        General: Normal range of motion.     Cervical back: Normal range of motion and neck supple.  Skin:    General: Skin is dry.     Findings: No rash.  Neurological:     Mental Status: She is alert and oriented to person, place, and time. Mental status is at baseline.     Motor: No abnormal muscle tone.  Psychiatric:        Mood and Affect: Mood and affect normal.     Lab    Latest Ref Rng & Units 03/07/2023    2:00 PM 09/06/2022    1:51 PM 06/17/2022   12:22 PM  CBC  WBC 4.0 - 10.5 K/uL 6.5  6.4  20.9   Hemoglobin 12.0 - 15.0 g/dL 16.1  09.6  04.5   Hematocrit 36.0 - 46.0 % 48.7  47.4  44.4   Platelets 150 - 400 K/uL 184  177  182       Latest Ref Rng & Units 09/06/2022    1:51 PM 07/28/2022    3:37 PM 06/17/2022   12:22 PM  CMP  Glucose 70 - 99 mg/dL 89  95  409   BUN 8 - 23 mg/dL 22  17  17    Creatinine 0.44 - 1.00 mg/dL 8.11  9.14  7.82   Sodium 135 - 145 mmol/L 137  143  130   Potassium 3.5 -  5.1 mmol/L 4.9  5.0  3.5   Chloride 98 - 111 mmol/L 101  103  98   CO2 22 - 32 mmol/L 28  23  22    Calcium 8.9 - 10.3 mg/dL 9.3  9.7  8.3   Total  Protein 6.5 - 8.1 g/dL 7.0  7.2    Total Bilirubin 0.3 - 1.2 mg/dL 0.7  0.3    Alkaline Phos 38 - 126 U/L 33  56    AST 15 - 41 U/L 28  31    ALT 0 - 44 U/L 18  19

## 2023-03-07 NOTE — Assessment & Plan Note (Signed)
#   erythrocytosis, presumed to be secondary to sleep apnea.- encourage compliance with CPAP No need for phlebotomy today.

## 2023-03-07 NOTE — Assessment & Plan Note (Signed)
#  Lung nodule found on chest x-ray and confirmed on CT scan, no FDG activity on PET scan. Patient is status post SBRT finished in December 2023 10/30/2021, CT chest with contrast showed radiation changes.  Small left pleural effusion. Future surveillance CT scan scheduled via radiation oncologist office.

## 2023-03-07 NOTE — Assessment & Plan Note (Addendum)
#  Left breast DCIS with microinvasive component, pTMi pN0 Status post lumpectomy.  No sentinel lymph node biopsy was done. S/p RT Labs reviewed and discussed with patient Continue tamoxifen 20 mg daily.  Plan 5 years-- end August 2027 Continue asprin 81mg  daily for thrombosis prophylaxis.  annual bilateral diagnostic mammogram in March 2025 I recommend annual pelvic examination while on tamoxifen. Follow up annually with gynecologist.

## 2023-03-22 ENCOUNTER — Other Ambulatory Visit: Payer: Self-pay | Admitting: Physician Assistant

## 2023-03-25 DIAGNOSIS — Z85820 Personal history of malignant melanoma of skin: Secondary | ICD-10-CM | POA: Diagnosis not present

## 2023-03-25 DIAGNOSIS — L821 Other seborrheic keratosis: Secondary | ICD-10-CM | POA: Diagnosis not present

## 2023-03-25 DIAGNOSIS — Z08 Encounter for follow-up examination after completed treatment for malignant neoplasm: Secondary | ICD-10-CM | POA: Diagnosis not present

## 2023-03-25 DIAGNOSIS — Z872 Personal history of diseases of the skin and subcutaneous tissue: Secondary | ICD-10-CM | POA: Diagnosis not present

## 2023-03-25 DIAGNOSIS — L814 Other melanin hyperpigmentation: Secondary | ICD-10-CM | POA: Diagnosis not present

## 2023-03-25 DIAGNOSIS — Z09 Encounter for follow-up examination after completed treatment for conditions other than malignant neoplasm: Secondary | ICD-10-CM | POA: Diagnosis not present

## 2023-04-05 DIAGNOSIS — M810 Age-related osteoporosis without current pathological fracture: Secondary | ICD-10-CM | POA: Diagnosis not present

## 2023-04-19 ENCOUNTER — Encounter: Payer: Self-pay | Admitting: Hematology and Oncology

## 2023-04-29 DIAGNOSIS — H01022 Squamous blepharitis right lower eyelid: Secondary | ICD-10-CM | POA: Diagnosis not present

## 2023-04-29 DIAGNOSIS — H40013 Open angle with borderline findings, low risk, bilateral: Secondary | ICD-10-CM | POA: Diagnosis not present

## 2023-04-29 DIAGNOSIS — D3102 Benign neoplasm of left conjunctiva: Secondary | ICD-10-CM | POA: Diagnosis not present

## 2023-04-29 DIAGNOSIS — H5203 Hypermetropia, bilateral: Secondary | ICD-10-CM | POA: Diagnosis not present

## 2023-04-29 DIAGNOSIS — H01025 Squamous blepharitis left lower eyelid: Secondary | ICD-10-CM | POA: Diagnosis not present

## 2023-04-29 DIAGNOSIS — H524 Presbyopia: Secondary | ICD-10-CM | POA: Diagnosis not present

## 2023-04-29 DIAGNOSIS — H52223 Regular astigmatism, bilateral: Secondary | ICD-10-CM | POA: Diagnosis not present

## 2023-04-29 DIAGNOSIS — Z961 Presence of intraocular lens: Secondary | ICD-10-CM | POA: Diagnosis not present

## 2023-05-12 ENCOUNTER — Encounter: Payer: Self-pay | Admitting: Hematology and Oncology

## 2023-05-19 ENCOUNTER — Encounter: Payer: Self-pay | Admitting: Physician Assistant

## 2023-05-19 ENCOUNTER — Ambulatory Visit: Payer: Medicare Other | Admitting: Physician Assistant

## 2023-05-19 ENCOUNTER — Other Ambulatory Visit: Payer: Self-pay | Admitting: Physician Assistant

## 2023-05-19 VITALS — BP 110/70 | HR 80 | Temp 98.0°F | Resp 16 | Ht 62.5 in | Wt 99.0 lb

## 2023-05-19 DIAGNOSIS — Z0000 Encounter for general adult medical examination without abnormal findings: Secondary | ICD-10-CM

## 2023-05-19 DIAGNOSIS — E782 Mixed hyperlipidemia: Secondary | ICD-10-CM | POA: Diagnosis not present

## 2023-05-19 DIAGNOSIS — Z0001 Encounter for general adult medical examination with abnormal findings: Secondary | ICD-10-CM | POA: Diagnosis not present

## 2023-05-19 DIAGNOSIS — R6889 Other general symptoms and signs: Secondary | ICD-10-CM | POA: Diagnosis not present

## 2023-05-19 DIAGNOSIS — E539 Vitamin B deficiency, unspecified: Secondary | ICD-10-CM | POA: Diagnosis not present

## 2023-05-19 DIAGNOSIS — E038 Other specified hypothyroidism: Secondary | ICD-10-CM | POA: Diagnosis not present

## 2023-05-19 DIAGNOSIS — R5383 Other fatigue: Secondary | ICD-10-CM | POA: Diagnosis not present

## 2023-05-19 NOTE — Progress Notes (Signed)
[facility] [address] [place], [place] [address]  Internal MEDICINE  Office Visit Note  Patient Name: Sabrina Zamora  [id number]  [id number]  Date of Service: 05/19/2023  Chief Complaint  Patient presents with   Medicare Wellness   Gastroesophageal Reflux   Hypertension   Hyperlipidemia    HPI Sabrina Zamora presents for an annual well visit and physical exam.  Well-appearing 89 y.o. female -Doing well today and has no concerns Routine mammogram: Followed by oncology/GS for hx of DCIS Labs: lab slip given, would like copy of results mailed as well Other concerns: weight up by 2lbs, has been working on gaining weight.  -Previously had reaction to shingles vaccines so didn't get second. Flu shot tomorrow and plans on PNA shot as well      05/19/2023    9:53 AM 05/13/2022    9:58 AM 05/11/2021    8:50 AM  MMSE - Mini Mental State Exam  Orientation to time 5 5 5   Orientation to Place 5 5 5   Registration 3 3 3   Attention/ Calculation 5 5 5   Recall 3 3 3   Language- name 2 objects 2 2 2   Language- repeat 1 1 1   Language- follow 3 step command 3 3 3   Language- read & follow direction 1 1 1   Write a sentence 1 1 1   Copy design 1 1 1   Total score 30 30 30     Functional Status Survey: Is the patient deaf or have difficulty hearing?: No Does the patient have difficulty seeing, even when wearing glasses/contacts?: No Does the patient have difficulty concentrating, remembering, or making decisions?: No Does the patient have difficulty walking or climbing stairs?: No Does the patient have difficulty dressing or bathing?: No Does the patient have difficulty doing errands alone such as visiting a doctor's office or shopping?: No     05/13/2022    9:57 AM 05/26/2022   10:14 AM 06/17/2022   10:45 AM 11/11/2022    8:41 AM 05/19/2023    9:53 AM  Fall Risk  Falls in the past year? 0   0 0  (RETIRED) Patient Fall Risk Level  Low fall risk Low fall risk    Patient at Risk  for Falls Due to    No Fall Risks   Fall risk Follow up    Falls evaluation completed        05/19/2023    9:53 AM  Depression screen PHQ 2/9  Decreased Interest 0  Down, Depressed, Hopeless 0  PHQ - 2 Score 0        No data to display            Current Medication: Outpatient Encounter Medications as of 05/19/2023  Medication Sig   albuterol (VENTOLIN HFA) 108 (90 Base) MCG/ACT inhaler Inhale 2 puffs into the lungs every 6 (six) hours as needed for wheezing or shortness of breath.   amLODipine (NORVASC) 2.5 MG tablet TAKE ONE TABLET (2.5 MG) BY MOUTH EVERY DAY   aspirin EC 81 MG tablet Take 81 mg by mouth 3 (three) times a week.   Cholecalciferol (VITAMIN D) 50 MCG (2000 UT) CAPS Take 2,000 Units by mouth daily.   denosumab (PROLIA) 60 MG/ML SOSY injection Inject 60 mg into the skin every 6 (six) months.   fluticasone-salmeterol (ADVAIR) 250-50 MCG/ACT AEPB Inhale 1 puff into the lungs in the morning and at bedtime.   levothyroxine (SYNTHROID) 25 MCG tablet TAKE ONE AND ONE-HALF TABLETS DAILY BEFORE BREAKFAST  lovastatin (MEVACOR) 20 MG tablet TAKE 1 TABLET BY MOUTH NIGHTLY   Multiple Vitamin (MULTI-VITAMINS) TABS Take 1 tablet by mouth daily.   mupirocin nasal ointment (BACTROBAN) 2 % Place 1 application into the nose at bedtime.   tamoxifen (NOLVADEX) 20 MG tablet Take 1 tablet (20 mg total) by mouth daily.   Zinc 25 MG TABS Take 25 mg by mouth daily.   No facility-administered encounter medications on file as of 05/19/2023.    Surgical History: Past Surgical History:  Procedure Laterality Date   BREAST BIOPSY Left 2003   neg   BREAST BIOPSY Right 09/27/2017   12:00 1 cmfn fibroadenomatous change    BREAST BIOPSY Right 09/17/2017   12:00 3 cmfn benign breast and fibroadipose tissue   BREAST BIOPSY Left 10/14/2020   stereo biopsy, ribbon clip/ INTERMEDIATE GRADE DUCTAL CARCINOMA IN SITU    BREAST EXCISIONAL BIOPSY Left 1988   excisional - neg   BREAST LUMPECTOMY  Left 11/25/2020   BREAST LUMPECTOMY WITH RADIOFREQUENCY TAG IDENTIFICATION Left 11/25/2020   Procedure: BREAST LUMPECTOMY WITH RADIOFREQUENCY TAG IDENTIFICATION;  Surgeon: Leafy Ro, MD;  Location: [facility] ORS;  Service: General;  Laterality: Left;   CATARACT EXTRACTION W/PHACO Left 09/20/2017   Procedure: CATARACT EXTRACTION PHACO AND INTRAOCULAR LENS PLACEMENT (IOC) LEFT;  Surgeon: Lockie Mola, MD;  Location[facility];  Service: Ophthalmology;  Laterality: Left;   CATARACT EXTRACTION W/PHACO Right 02/15/2018   Procedure: CATARACT EXTRACTION PHACO AND INTRAOCULAR LENS PLACEMENT (IOC) TOPICAL  RIGHT;  Surgeon: Lockie Mola, MD;  Location: [facility];  Service: Ophthalmology;  Laterality: Right;  prefers early   CHOLECYSTECTOMY     EYE SURGERY      Medical History: Past Medical History:  Diagnosis Date   Asthma    GERD (gastroesophageal reflux disease)    High cholesterol    Hypertension    Hypothyroidism    Melanoma (HCC) 1986, 2008, 2016, 2021   melenoma left arm, left leg, back, back respectively   Osteoporosis    Polycythemia vera (HCC)    Skin cancer 2012, 2016   Sleep apnea     Family History: Family History  Problem Relation Age of Onset   Clotting disorder Sister    Breast cancer Sister 64   Heart disease Mother    Heart disease Father     Social History   Socioeconomic History   Marital status: Married    Spouse name: Sabrina Zamora   Number of children: 3   Years of education: Not on file   Highest education level: Not on file  Occupational History   Not on file  Tobacco Use   Smoking status: Never   Smokeless tobacco: Never  Vaping Use   Vaping status: Never Used  Substance and Sexual Activity   Alcohol use: Never    Alcohol/week: 0.0 standard drinks of alcohol   Drug use: Never   Sexual activity: Not on file  Other Topics Concern   Not on file  Social History Narrative   Not on file   Social Determinants of Health    Financial Resource Strain: Not on file  Food Insecurity: Not on file  Transportation Needs: Not on file  Physical Activity: Not on file  Stress: Not on file  Social Connections: Not on file  Intimate Partner Violence: Not on file      Review of Systems  Constitutional:  Negative for chills, fatigue and unexpected weight change.  HENT:  Negative for congestion, rhinorrhea, sneezing and sore  throat.   Eyes:  Negative for redness.  Respiratory:  Negative for cough, chest tightness and shortness of breath.   Cardiovascular:  Negative for chest pain and palpitations.  Gastrointestinal:  Negative for abdominal pain, constipation, diarrhea, nausea and vomiting.  Genitourinary:  Negative for dysuria and frequency.  Musculoskeletal:  Negative for arthralgias, back pain, joint swelling and neck pain.  Skin:  Negative for rash.  Neurological: Negative.  Negative for tremors and numbness.  Hematological:  Negative for adenopathy. Does not bruise/bleed easily.  Psychiatric/Behavioral:  Negative for behavioral problems (Depression), sleep disturbance and suicidal ideas. The patient is not nervous/anxious.     Vital Signs: BP 110/70   Pulse 80   Temp 98 F (36.7 C)   Resp 16   Ht 5' 2.5" (1.588 m)   Wt 99 lb (44.9 kg)   SpO2 99%   BMI 17.82 kg/m    Physical Exam Vitals and nursing note reviewed.  Constitutional:      Appearance: Normal appearance.  HENT:     Head: Normocephalic and atraumatic.  Cardiovascular:     Rate and Rhythm: Normal rate and regular rhythm.  Pulmonary:     Effort: Pulmonary effort is normal.     Breath sounds: Normal breath sounds.  Neurological:     Mental Status: She is alert.  Psychiatric:        Behavior: Behavior normal.        Assessment/Plan: 1. Encounter for Medicare annual wellness exam AWV performed, lab slip given. Plans to have flu and PNA shots soon    General Counseling: Emilynn verbalizes understanding of the findings of  todays visit and agrees with plan of treatment. I have discussed any further diagnostic evaluation that may be needed or ordered today. We also reviewed her medications today. she has been encouraged to call the office with any questions or concerns that should arise related to todays visit.    No orders of the defined types were placed in this encounter.   No orders of the defined types were placed in this encounter.   Return in about 6 months (around 11/16/2023).   Total time spent:30 Minutes Time spent includes review of chart, medications, test results, and follow up plan with the patient.   [place] Controlled Substance Database was reviewed by me.  This patient was seen by Lynn Ito, PA-C in collaboration with Dr. Beverely Risen as a part of collaborative care agreement.  Lynn Ito, PA-C Internal medicine

## 2023-05-20 DIAGNOSIS — Z23 Encounter for immunization: Secondary | ICD-10-CM | POA: Diagnosis not present

## 2023-05-20 LAB — COMPREHENSIVE METABOLIC PANEL
ALT: 17 [IU]/L (ref 0–32)
AST: 25 [IU]/L (ref 0–40)
Albumin: 4.2 g/dL (ref 3.6–4.6)
Alkaline Phosphatase: 43 [IU]/L — ABNORMAL LOW (ref 44–121)
BUN/Creatinine Ratio: 26 (ref 12–28)
BUN: 21 mg/dL (ref 10–36)
Bilirubin Total: 0.4 mg/dL (ref 0.0–1.2)
CO2: 26 mmol/L (ref 20–29)
Calcium: 10.1 mg/dL (ref 8.7–10.3)
Chloride: 100 mmol/L (ref 96–106)
Creatinine, Ser: 0.82 mg/dL (ref 0.57–1.00)
Globulin, Total: 2.7 g/dL (ref 1.5–4.5)
Glucose: 95 mg/dL (ref 70–99)
Potassium: 5.2 mmol/L (ref 3.5–5.2)
Sodium: 140 mmol/L (ref 134–144)
Total Protein: 6.9 g/dL (ref 6.0–8.5)
eGFR: 68 mL/min/{1.73_m2} (ref >59)

## 2023-05-20 LAB — CBC WITH DIFFERENTIAL/PLATELET
Basophils Absolute: 0.1 10*3/uL (ref 0.0–0.2)
Basos: 1 %
EOS (ABSOLUTE): 0.1 10*3/uL (ref 0.0–0.4)
Eos: 1 %
Hematocrit: 50.4 % — ABNORMAL HIGH (ref 34.0–46.6)
Hemoglobin: 16.1 g/dL — ABNORMAL HIGH (ref 11.1–15.9)
Immature Grans (Abs): 0 10*3/uL (ref 0.0–0.1)
Immature Granulocytes: 0 %
Lymphocytes Absolute: 1.4 10*3/uL (ref 0.7–3.1)
Lymphs: 21 %
MCH: 31.4 pg (ref 26.6–33.0)
MCHC: 31.9 g/dL (ref 31.5–35.7)
MCV: 98 fL — ABNORMAL HIGH (ref 79–97)
Monocytes Absolute: 0.7 10*3/uL (ref 0.1–0.9)
Monocytes: 10 %
Neutrophils Absolute: 4.4 10*3/uL (ref 1.4–7.0)
Neutrophils: 67 %
Platelets: 185 10*3/uL (ref 150–450)
RBC: 5.13 x10E6/uL (ref 3.77–5.28)
RDW: 12.2 % (ref 11.7–15.4)
WBC: 6.6 10*3/uL (ref 3.4–10.8)

## 2023-05-20 LAB — TSH: TSH: 2.51 u[IU]/mL (ref 0.450–4.500)

## 2023-05-20 LAB — LIPID PANEL WITH LDL/HDL RATIO
Cholesterol, Total: 164 mg/dL (ref 100–199)
HDL: 74 mg/dL (ref >39)
LDL Chol Calc (NIH): 75 mg/dL (ref 0–99)
LDL/HDL Ratio: 1 ratio (ref 0.0–3.2)
Triglycerides: 78 mg/dL (ref 0–149)
VLDL Cholesterol Cal: 15 mg/dL (ref 5–40)

## 2023-05-20 LAB — T4, FREE: Free T4: 1.55 ng/dL (ref 0.82–1.77)

## 2023-05-25 LAB — B12 AND FOLATE PANEL
Folate: >20 ng/mL (ref >3.0)
Vitamin B-12: 1175 pg/mL (ref 232–1245)

## 2023-05-25 LAB — SPECIMEN STATUS REPORT

## 2023-05-31 ENCOUNTER — Other Ambulatory Visit: Payer: Self-pay | Admitting: Oncology

## 2023-05-31 ENCOUNTER — Telehealth: Payer: Self-pay

## 2023-05-31 NOTE — Telephone Encounter (Signed)
-----   Message from Carlean Jews sent at 05/27/2023  8:16 AM EST ----- Can you please mail patient a copy of labs and let her know that you are doing so. Labs look good, hemoglobin still a little elevated which Dr. Cathie Hoops has been following and encouraging cpap use.

## 2023-05-31 NOTE — Telephone Encounter (Signed)
Mailed patient copy of lab results per Lauren.

## 2023-06-14 ENCOUNTER — Telehealth: Payer: Self-pay | Admitting: *Deleted

## 2023-06-14 NOTE — Telephone Encounter (Signed)
Call returned to patient and informed of doctor response that she does not need a Phlebotomy

## 2023-06-14 NOTE — Telephone Encounter (Signed)
Patient called reporting that she had her PCP physical labs drawn and hwer Hgb is 16.1 Hct 50.4 Next appointment not until 09/07/23 and is asking if she needs a phlebotomy before then. Please advise

## 2023-07-07 DIAGNOSIS — S81812A Laceration without foreign body, left lower leg, initial encounter: Secondary | ICD-10-CM | POA: Diagnosis not present

## 2023-07-07 DIAGNOSIS — Z681 Body mass index (BMI) 19 or less, adult: Secondary | ICD-10-CM | POA: Diagnosis not present

## 2023-07-07 DIAGNOSIS — Z23 Encounter for immunization: Secondary | ICD-10-CM | POA: Diagnosis not present

## 2023-07-09 DIAGNOSIS — Z681 Body mass index (BMI) 19 or less, adult: Secondary | ICD-10-CM | POA: Diagnosis not present

## 2023-07-09 DIAGNOSIS — S81812D Laceration without foreign body, left lower leg, subsequent encounter: Secondary | ICD-10-CM | POA: Diagnosis not present

## 2023-07-11 DIAGNOSIS — Z681 Body mass index (BMI) 19 or less, adult: Secondary | ICD-10-CM | POA: Diagnosis not present

## 2023-07-11 DIAGNOSIS — S81812D Laceration without foreign body, left lower leg, subsequent encounter: Secondary | ICD-10-CM | POA: Diagnosis not present

## 2023-07-14 DIAGNOSIS — Z681 Body mass index (BMI) 19 or less, adult: Secondary | ICD-10-CM | POA: Diagnosis not present

## 2023-07-14 DIAGNOSIS — S81802A Unspecified open wound, left lower leg, initial encounter: Secondary | ICD-10-CM | POA: Diagnosis not present

## 2023-07-14 DIAGNOSIS — Z789 Other specified health status: Secondary | ICD-10-CM | POA: Diagnosis not present

## 2023-07-18 DIAGNOSIS — S81802A Unspecified open wound, left lower leg, initial encounter: Secondary | ICD-10-CM | POA: Diagnosis not present

## 2023-07-18 DIAGNOSIS — Z681 Body mass index (BMI) 19 or less, adult: Secondary | ICD-10-CM | POA: Diagnosis not present

## 2023-07-18 DIAGNOSIS — Z789 Other specified health status: Secondary | ICD-10-CM | POA: Diagnosis not present

## 2023-07-20 DIAGNOSIS — Z681 Body mass index (BMI) 19 or less, adult: Secondary | ICD-10-CM | POA: Diagnosis not present

## 2023-07-20 DIAGNOSIS — S81812D Laceration without foreign body, left lower leg, subsequent encounter: Secondary | ICD-10-CM | POA: Diagnosis not present

## 2023-07-25 DIAGNOSIS — Z681 Body mass index (BMI) 19 or less, adult: Secondary | ICD-10-CM | POA: Diagnosis not present

## 2023-07-25 DIAGNOSIS — S81812D Laceration without foreign body, left lower leg, subsequent encounter: Secondary | ICD-10-CM | POA: Diagnosis not present

## 2023-08-01 DIAGNOSIS — S81812D Laceration without foreign body, left lower leg, subsequent encounter: Secondary | ICD-10-CM | POA: Diagnosis not present

## 2023-08-01 DIAGNOSIS — Z681 Body mass index (BMI) 19 or less, adult: Secondary | ICD-10-CM | POA: Diagnosis not present

## 2023-08-04 ENCOUNTER — Other Ambulatory Visit: Payer: Self-pay | Admitting: Physician Assistant

## 2023-08-04 DIAGNOSIS — M810 Age-related osteoporosis without current pathological fracture: Secondary | ICD-10-CM | POA: Diagnosis not present

## 2023-08-05 DIAGNOSIS — Z09 Encounter for follow-up examination after completed treatment for conditions other than malignant neoplasm: Secondary | ICD-10-CM | POA: Diagnosis not present

## 2023-08-05 DIAGNOSIS — Z85820 Personal history of malignant melanoma of skin: Secondary | ICD-10-CM | POA: Diagnosis not present

## 2023-08-05 DIAGNOSIS — S81812A Laceration without foreign body, left lower leg, initial encounter: Secondary | ICD-10-CM | POA: Diagnosis not present

## 2023-08-05 DIAGNOSIS — L820 Inflamed seborrheic keratosis: Secondary | ICD-10-CM | POA: Diagnosis not present

## 2023-08-05 DIAGNOSIS — Z08 Encounter for follow-up examination after completed treatment for malignant neoplasm: Secondary | ICD-10-CM | POA: Diagnosis not present

## 2023-08-05 DIAGNOSIS — Z85828 Personal history of other malignant neoplasm of skin: Secondary | ICD-10-CM | POA: Diagnosis not present

## 2023-08-05 DIAGNOSIS — Z872 Personal history of diseases of the skin and subcutaneous tissue: Secondary | ICD-10-CM | POA: Diagnosis not present

## 2023-08-09 ENCOUNTER — Other Ambulatory Visit: Payer: Self-pay | Admitting: Oncology

## 2023-08-09 DIAGNOSIS — S81812D Laceration without foreign body, left lower leg, subsequent encounter: Secondary | ICD-10-CM | POA: Diagnosis not present

## 2023-08-09 DIAGNOSIS — D0512 Intraductal carcinoma in situ of left breast: Secondary | ICD-10-CM

## 2023-08-09 DIAGNOSIS — Z681 Body mass index (BMI) 19 or less, adult: Secondary | ICD-10-CM | POA: Diagnosis not present

## 2023-08-10 DIAGNOSIS — D3102 Benign neoplasm of left conjunctiva: Secondary | ICD-10-CM | POA: Diagnosis not present

## 2023-08-10 DIAGNOSIS — H01022 Squamous blepharitis right lower eyelid: Secondary | ICD-10-CM | POA: Diagnosis not present

## 2023-08-10 DIAGNOSIS — H01025 Squamous blepharitis left lower eyelid: Secondary | ICD-10-CM | POA: Diagnosis not present

## 2023-08-10 DIAGNOSIS — Z961 Presence of intraocular lens: Secondary | ICD-10-CM | POA: Diagnosis not present

## 2023-08-10 DIAGNOSIS — H40013 Open angle with borderline findings, low risk, bilateral: Secondary | ICD-10-CM | POA: Diagnosis not present

## 2023-08-30 DIAGNOSIS — Z681 Body mass index (BMI) 19 or less, adult: Secondary | ICD-10-CM | POA: Diagnosis not present

## 2023-08-30 DIAGNOSIS — S81812D Laceration without foreign body, left lower leg, subsequent encounter: Secondary | ICD-10-CM | POA: Diagnosis not present

## 2023-08-31 ENCOUNTER — Other Ambulatory Visit: Payer: Self-pay | Admitting: Oncology

## 2023-09-01 ENCOUNTER — Encounter: Payer: Self-pay | Admitting: Hematology and Oncology

## 2023-09-07 ENCOUNTER — Encounter: Payer: Self-pay | Admitting: Oncology

## 2023-09-07 ENCOUNTER — Inpatient Hospital Stay: Payer: Medicare Other | Attending: Oncology

## 2023-09-07 ENCOUNTER — Inpatient Hospital Stay (HOSPITAL_BASED_OUTPATIENT_CLINIC_OR_DEPARTMENT_OTHER): Payer: Medicare Other | Admitting: Oncology

## 2023-09-07 ENCOUNTER — Inpatient Hospital Stay: Payer: Medicare Other

## 2023-09-07 VITALS — BP 160/90 | HR 79 | Temp 98.2°F | Resp 18 | Wt 101.3 lb

## 2023-09-07 DIAGNOSIS — Z79810 Long term (current) use of selective estrogen receptor modulators (SERMs): Secondary | ICD-10-CM | POA: Diagnosis not present

## 2023-09-07 DIAGNOSIS — D751 Secondary polycythemia: Secondary | ICD-10-CM

## 2023-09-07 DIAGNOSIS — R911 Solitary pulmonary nodule: Secondary | ICD-10-CM

## 2023-09-07 DIAGNOSIS — D0512 Intraductal carcinoma in situ of left breast: Secondary | ICD-10-CM | POA: Insufficient documentation

## 2023-09-07 DIAGNOSIS — C50912 Malignant neoplasm of unspecified site of left female breast: Secondary | ICD-10-CM | POA: Diagnosis not present

## 2023-09-07 DIAGNOSIS — Z923 Personal history of irradiation: Secondary | ICD-10-CM | POA: Insufficient documentation

## 2023-09-07 LAB — CBC WITH DIFFERENTIAL (CANCER CENTER ONLY)
Abs Immature Granulocytes: 0.05 10*3/uL (ref 0.00–0.07)
Basophils Absolute: 0 10*3/uL (ref 0.0–0.1)
Basophils Relative: 1 %
Eosinophils Absolute: 0.1 10*3/uL (ref 0.0–0.5)
Eosinophils Relative: 2 %
HCT: 44.4 % (ref 36.0–46.0)
Hemoglobin: 14.4 g/dL (ref 12.0–15.0)
Immature Granulocytes: 1 %
Lymphocytes Relative: 35 %
Lymphs Abs: 2 10*3/uL (ref 0.7–4.0)
MCH: 31.5 pg (ref 26.0–34.0)
MCHC: 32.4 g/dL (ref 30.0–36.0)
MCV: 97.2 fL (ref 80.0–100.0)
Monocytes Absolute: 0.8 10*3/uL (ref 0.1–1.0)
Monocytes Relative: 14 %
Neutro Abs: 2.8 10*3/uL (ref 1.7–7.7)
Neutrophils Relative %: 47 %
Platelet Count: 179 10*3/uL (ref 150–400)
RBC: 4.57 MIL/uL (ref 3.87–5.11)
RDW: 13.2 % (ref 11.5–15.5)
WBC Count: 5.8 10*3/uL (ref 4.0–10.5)
nRBC: 0 % (ref 0.0–0.2)

## 2023-09-07 LAB — CMP (CANCER CENTER ONLY)
ALT: 16 U/L (ref 0–44)
AST: 25 U/L (ref 15–41)
Albumin: 3.5 g/dL (ref 3.5–5.0)
Alkaline Phosphatase: 33 U/L — ABNORMAL LOW (ref 38–126)
Anion gap: 8 (ref 5–15)
BUN: 22 mg/dL (ref 8–23)
CO2: 25 mmol/L (ref 22–32)
Calcium: 9.2 mg/dL (ref 8.9–10.3)
Chloride: 104 mmol/L (ref 98–111)
Creatinine: 0.66 mg/dL (ref 0.44–1.00)
GFR, Estimated: >60 mL/min (ref >60)
Glucose, Bld: 102 mg/dL — ABNORMAL HIGH (ref 70–99)
Potassium: 4.2 mmol/L (ref 3.5–5.1)
Sodium: 137 mmol/L (ref 135–145)
Total Bilirubin: 0.6 mg/dL (ref 0.0–1.2)
Total Protein: 6.7 g/dL (ref 6.5–8.1)

## 2023-09-07 NOTE — Assessment & Plan Note (Addendum)
#  Lung nodule found on chest x-ray and confirmed on CT scan, no FDG activity on PET scan. Patient is status post SBRT finished in December 2023 10/30/2021, CT chest with contrast showed radiation changes.  Small left pleural effusion. surveillance CT scan was ordered and  scheduled via radiation oncologist office.

## 2023-09-07 NOTE — Progress Notes (Signed)
 Hematology/Oncology Progress note [phone] [phone]    Chief Complaint: secondary polycythemia, lung nodule [presumed lung cancer], left breast DCIS with microinvasive component.   ASSESSMENT & PLAN:   Ductal carcinoma in situ (DCIS) of left breast with microinvasive component (HCC) #Left breast DCIS with microinvasive component, pTMi pN0 Status post lumpectomy.  No sentinel lymph node biopsy was done. S/p RT Labs reviewed and discussed with patient Continue tamoxifen 20 mg daily.  Plan 5 years-- end August 2027 Continue asprin 81mg  daily for thrombosis prophylaxis.  annual bilateral diagnostic mammogram in March/April 2025 I recommend annual pelvic examination while on tamoxifen. Follow up annually with gynecologist.  Erythrocytosis # erythrocytosis, resolved after she utilized CPAP machine. No need for phlebotomy today.    Lung nodule #Lung nodule found on chest x-ray and confirmed on CT scan, no FDG activity on PET scan. Patient is status post SBRT finished in December 2023 10/30/2021, CT chest with contrast showed radiation changes.  Small left pleural effusion. surveillance CT scan was ordered and  scheduled via radiation oncologist office.  Orders Placed This Encounter  Procedures   CMP (Cancer Center only)    Standing Status:   Future    Expected Date:   03/06/2024    Expiration Date:   09/06/2024   CBC with Differential (Cancer Center Only)    Standing Status:   Future    Expected Date:   03/06/2024    Expiration Date:   09/06/2024   Follow up in 6 months.  All questions were answered. The patient knows to call the clinic with any problems, questions or concerns.  Sabrina Patience, MD, PhD [facility] Hematology Oncology 09/07/2023   Ductal carcinoma in situ (DCIS) of left breast with microinvasive component (HCC) #Left breast DCIS with microinvasive component, pTMi pN0 Status post lumpectomy.  No sentinel lymph node biopsy was done. S/p RT Labs  reviewed and discussed with patient Continue tamoxifen 20 mg daily.  Plan 5 years-- end August 2027 Continue asprin 81mg  daily for thrombosis prophylaxis.  annual bilateral diagnostic mammogram in March/April 2025 I recommend annual pelvic examination while on tamoxifen. Follow up annually with gynecologist.  Erythrocytosis # erythrocytosis, resolved after she utilized CPAP machine. No need for phlebotomy today.    Lung nodule #Lung nodule found on chest x-ray and confirmed on CT scan, no FDG activity on PET scan. Patient is status post SBRT finished in December 2023 10/30/2021, CT chest with contrast showed radiation changes.  Small left pleural effusion. surveillance CT scan was ordered and  scheduled via radiation oncologist office.  Orders Placed This Encounter  Procedures   CMP (Cancer Center only)    Standing Status:   Future    Expected Date:   03/06/2024    Expiration Date:   09/06/2024   CBC with Differential (Cancer Center Only)    Standing Status:   Future    Expected Date:   03/06/2024    Expiration Date:   09/06/2024    All questions were answered. The patient knows to call the clinic with any problems, questions or concerns.  Sabrina Patience, MD, PhD [facility] Hematology Oncology 09/07/2023   HPI:  Patient previously followed up with Dr. Merlene Zamora.  Patient switched care to me on 11/29/2018. Extensive chart review was performed.  #Secondary polycythemia Patient has a history of sleep apnea she wears oxygen at night but no CPAP mask.  Never smoker. Work-up on 01/11/2012 reviewed and negative Jak 2 V617F mutation, exon 12 mutation. 08/25/2018 CALR negative,  JAK 2 E12-15 negative, MPL negative.  Patient has been on phlebotomy program to keep hematocrit less than 48 and then later to keep it less than 50.  #History of melanoma x3.  T1 a melanoma in August 2012. saw Dermatology and had topical chemotherapy treatment to her forehead lesion.   #She has been compliant with CPAP  machine. She did nocturnal oxygen testing and she is not qualified for nocturnal oxygen.   # Lung nodule -presumed lung cancer. She is a never smoker.  Report second hand smoke exposure when she grew up and during the initial few years of her marriage.    Her husband stopped smoking in 1960s.   # 06/26/2020, CT without contrast showed a partial solid nodule within the superior segment of the left lower pole.  This measures 2.4 cm with a solid component measuring 5 mm.  Bronchiectasis with tree-in-bud nodularity is noted within the anterior and posterior basal portions of the right upper lobe.  10/06/2020 CT chest wo contrast - mixed solid and ground-glass nodule of  the anterior superior segment left lower lobe, with tenting of the adjacent fissural pleura. The overall dimensions are approximately 2.8 x 2.4 cm, previously 2.6 x 2.3 cm when measured similarly, with nodular components measuring approximately 0.5 cm and 0.7 cm. Central nodular components appear somewhat more solid than on comparison prior although are not substantially changed in size a new 5 mm pulmonary nodule of the lateral segment right middle lobe, nonspecific.Stable 4 mm nodule of the anterior right lower lobe, which remains nonspecific.4. There are numerous additional unchanged clustered and tree-in-bud centrilobular nodules, particularly in the posterior right upper lobe. 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  Patient's case was discussed on tumor board on 10/15/2020.  Consensus reached about proceed with biopsy versus PET scan+ empiric SBRT given her age. 10/28/2020, PET scan did not show activity of the left lower lobe nodule.  However her tumor board discussion, CT appearance is indicated for of lung cancer.  Patient was referred to establish care with Dr. Rushie Chestnut radiation oncology and was evaluated on 11/03/2020.  RadOnc recommend watchful waiting for the time being.  Should the nodule  continues to solidify and grow patient will be offered empirically SBRT at that time.  05/04/2021, CT chest showed continued slow growth of left lower lobe lung lesion. 06/23/2021, patient finished SBRT to left lower lung lesion.   Left breast DCIS with microinvasive component 09/25/20 screening mammogram showed possible distortion and calcifications in the left breast.  10/08/2020 left diagnostic mammogram showed Grouped microcalcifications in the left breast, Four circumscribed hypoechoic masses in the left breast may represent complicated cysts and are probably benign.  10/14/2020 left breast upper outer quadrant at middle depth-- intermediate grade DCIS, 5mm, with microcalcifications.  11/25/2020, patient underwent left lumpectomy by Dr. Everlene Farrier There is microinvasive mammary carcinoma with high-grade DCIS.  Negative margins.  pTmi pNx Given her advanced age, multiple other medical conditions, strongly ER positivity, informed decision was made to omit sentinel lymph node biopsy. Case was discussed on breast cancer tumor board.  01/14/2021 finished radiation.  02/03/2021 started on tamoxifen  INTERVAL HISTORY Sabrina Zamora is a 89 y.o. female who has above history reviewed by me today presents for follow up visit for erythrocytosis, lung nodule, left breast DCIS with microinvasive component Patient tolerates tamoxifen well.  No new breast concerns. She has used her CPAP machine for sleep apnea.    Review of Systems  Constitutional:  Negative for  appetite change, chills, fatigue and fever.  HENT:   Negative for hearing loss and voice change.   Eyes:  Negative for eye problems.  Respiratory:  Negative for chest tightness and cough.   Cardiovascular:  Negative for chest pain.  Gastrointestinal:  Negative for abdominal distention, abdominal pain and blood in stool.  Endocrine: Negative for hot flashes.  Genitourinary:  Negative for difficulty urinating and frequency.   Musculoskeletal:  Negative  for arthralgias.  Skin:  Negative for itching and rash.  Neurological:  Negative for extremity weakness.  Hematological:  Negative for adenopathy.  Psychiatric/Behavioral:  Negative for confusion.     Past Medical History:  Diagnosis Date   Asthma    GERD (gastroesophageal reflux disease)    High cholesterol    Hypertension    Hypothyroidism    Melanoma (HCC) 1986, 2008, 2016, 2021   melenoma left arm, left leg, back, back respectively   Osteoporosis    Polycythemia vera (HCC)    Skin cancer 2012, 2016   Sleep apnea     Past Surgical History:  Procedure Laterality Date   BREAST BIOPSY Left 2003   neg   BREAST BIOPSY Right 09/27/2017   12:00 1 cmfn fibroadenomatous change    BREAST BIOPSY Right 09/17/2017   12:00 3 cmfn benign breast and fibroadipose tissue   BREAST BIOPSY Left 10/14/2020   stereo biopsy, ribbon clip/ INTERMEDIATE GRADE DUCTAL CARCINOMA IN SITU    BREAST EXCISIONAL BIOPSY Left 1988   excisional - neg   BREAST LUMPECTOMY Left 11/25/2020   BREAST LUMPECTOMY WITH RADIOFREQUENCY TAG IDENTIFICATION Left 11/25/2020   Procedure: BREAST LUMPECTOMY WITH RADIOFREQUENCY TAG IDENTIFICATION;  Surgeon: Leafy Ro, MD;  Location: [facility];  Service: General;  Laterality: Left;   CATARACT EXTRACTION W/PHACO Left 09/20/2017   Procedure: CATARACT EXTRACTION PHACO AND INTRAOCULAR LENS PLACEMENT (IOC) LEFT;  Surgeon: Lockie Mola, MD;  Location: [facility];  Service: Ophthalmology;  Laterality: Left;   CATARACT EXTRACTION W/PHACO Right 02/15/2018   Procedure: CATARACT EXTRACTION PHACO AND INTRAOCULAR LENS PLACEMENT (IOC) TOPICAL  RIGHT;  Surgeon: Lockie Mola, MD;  Location: [facility];  Service: Ophthalmology;  Laterality: Right;  prefers early   CHOLECYSTECTOMY     EYE SURGERY      Family History  Problem Relation Age of Onset   Clotting disorder Sister    Breast cancer Sister 67   Heart disease Mother    Heart disease Father      Social History:  reports that she has never smoked. She has never used smokeless tobacco. She reports that she does not drink alcohol and does not use drugs.  She has never smoked.  The patient is alone today.  Allergies:  Allergies  Allergen Reactions   Iodine Shortness Of Breath and Cough    Patient states she had a reaction with dose of contrast in 10/2021   Levofloxacin Rash and Shortness Of Breath   Penicillins Hives and Rash   Sulfa Antibiotics Rash   Tobramycin Other (See Comments)    Unknown   Betadine [Povidone Iodine] Rash   Ciprofloxacin Itching and Rash    Current Medications: Current Outpatient Medications  Medication Sig Dispense Refill   albuterol (VENTOLIN HFA) 108 (90 Base) MCG/ACT inhaler Inhale 2 puffs into the lungs every 6 (six) hours as needed for wheezing or shortness of breath. 8 g 2   amLODipine (NORVASC) 2.5 MG tablet TAKE ONE TABLET (2.5 MG) BY MOUTH EVERY DAY 45 tablet 3   aspirin  EC 81 MG tablet Take 81 mg by mouth 3 (three) times a week.     Cholecalciferol (VITAMIN D) 50 MCG (2000 UT) CAPS Take 2,000 Units by mouth daily.     denosumab (PROLIA) 60 MG/ML SOSY injection Inject 60 mg into the skin every 6 (six) months.     fluticasone-salmeterol (ADVAIR) 250-50 MCG/ACT AEPB Inhale 1 puff into the lungs in the morning and at bedtime. 3 each 3   levothyroxine (SYNTHROID) 25 MCG tablet TAKE ONE AND ONE-HALF TABLETS DAILY BEFORE BREAKFAST 135 tablet 3   lovastatin (MEVACOR) 20 MG tablet TAKE 1 TABLET BY MOUTH NIGHTLY 90 tablet 2   Multiple Vitamin (MULTI-VITAMINS) TABS Take 1 tablet by mouth daily.     mupirocin nasal ointment (BACTROBAN) 2 % Place 1 application into the nose at bedtime. 10 g 1   tamoxifen (NOLVADEX) 20 MG tablet TAKE 1 TABLET BY MOUTH DAILY 90 tablet 1   Zinc 25 MG TABS Take 25 mg by mouth daily.     No current facility-administered medications for this visit.    Physical Exam: Blood pressure (!) 160/90, pulse 79, temperature 98.2 F  (36.8 C), resp. rate 18, weight 101 lb 4.8 oz (45.9 kg).  Physical Exam Constitutional:      General: She is not in acute distress.    Appearance: Normal appearance. She is not diaphoretic.     Comments: Thin built, walks independently.   HENT:     Head: Normocephalic and atraumatic.  Eyes:     General: No scleral icterus. Cardiovascular:     Rate and Rhythm: Normal rate and regular rhythm.  Pulmonary:     Effort: Pulmonary effort is normal. No respiratory distress.     Breath sounds: No wheezing.  Abdominal:     General: There is no distension.     Palpations: Abdomen is soft.  Musculoskeletal:        General: Normal range of motion.     Cervical back: Normal range of motion and neck supple.  Skin:    General: Skin is dry.     Findings: No rash.  Neurological:     Mental Status: She is alert and oriented to person, place, and time. Mental status is at baseline.     Motor: No abnormal muscle tone.  Psychiatric:        Mood and Affect: Mood and affect normal.     Lab    Latest Ref Rng & Units 09/07/2023    1:04 PM 05/19/2023   10:47 AM 03/07/2023    2:00 PM  CBC  WBC 4.0 - 10.5 K/uL 5.8  6.6  6.5   Hemoglobin 12.0 - 15.0 g/dL 09.8  11.9  14.7   Hematocrit 36.0 - 46.0 % 44.4  50.4  48.7   Platelets 150 - 400 K/uL 179  185  184       Latest Ref Rng & Units 09/07/2023    1:03 PM 05/19/2023   10:47 AM 03/07/2023    2:00 PM  CMP  Glucose 70 - 99 mg/dL 829  95  93   BUN 8 - 23 mg/dL 22  21  22    Creatinine 0.44 - 1.00 mg/dL 5.62  1.30  8.65   Sodium 135 - 145 mmol/L 137  140  136   Potassium 3.5 - 5.1 mmol/L 4.2  5.2  3.9   Chloride 98 - 111 mmol/L 104  100  103   CO2 22 - 32 mmol/L 25  26  26   Calcium 8.9 - 10.3 mg/dL 9.2  29.5  9.1   Total Protein 6.5 - 8.1 g/dL 6.7  6.9  7.1   Total Bilirubin 0.0 - 1.2 mg/dL 0.6  0.4  0.6   Alkaline Phos 38 - 126 U/L 33  43  38   AST 15 - 41 U/L 25  25  27    ALT 0 - 44 U/L 16  17  21

## 2023-09-07 NOTE — Assessment & Plan Note (Addendum)
#  Left breast DCIS with microinvasive component, pTMi pN0 Status post lumpectomy.  No sentinel lymph node biopsy was done. S/p RT Labs reviewed and discussed with patient Continue tamoxifen 20 mg daily.  Plan 5 years-- end August 2027 Continue asprin 81mg  daily for thrombosis prophylaxis.  annual bilateral diagnostic mammogram in March/April 2025 I recommend annual pelvic examination while on tamoxifen. Follow up annually with gynecologist.

## 2023-09-07 NOTE — Assessment & Plan Note (Addendum)
#   erythrocytosis, resolved after she utilized CPAP machine. No need for phlebotomy today.

## 2023-10-06 ENCOUNTER — Other Ambulatory Visit: Payer: Self-pay | Admitting: Physician Assistant

## 2023-10-06 DIAGNOSIS — J4489 Other specified chronic obstructive pulmonary disease: Secondary | ICD-10-CM

## 2023-10-10 ENCOUNTER — Telehealth: Payer: Self-pay | Admitting: Physician Assistant

## 2023-10-10 NOTE — Telephone Encounter (Signed)
 Patient called regarding fluticasone-salmeterol (ADVAIR) refills. I told her refill was sent in this morning to [facility]

## 2023-10-12 ENCOUNTER — Encounter: Payer: Self-pay | Admitting: Hematology and Oncology

## 2023-10-12 ENCOUNTER — Ambulatory Visit
Admission: RE | Admit: 2023-10-12 | Discharge: 2023-10-12 | Disposition: A | Discharge status: Home / Self Care | Source: Ambulatory Visit | Attending: Oncology | Admitting: Oncology

## 2023-10-12 DIAGNOSIS — R92333 Mammographic heterogeneous density, bilateral breasts: Secondary | ICD-10-CM | POA: Diagnosis not present

## 2023-10-12 DIAGNOSIS — D0512 Intraductal carcinoma in situ of left breast: Secondary | ICD-10-CM | POA: Insufficient documentation

## 2023-10-12 DIAGNOSIS — R928 Other abnormal and inconclusive findings on diagnostic imaging of breast: Secondary | ICD-10-CM | POA: Diagnosis not present

## 2023-10-26 ENCOUNTER — Telehealth: Payer: Self-pay | Admitting: Physician Assistant

## 2023-10-26 NOTE — Telephone Encounter (Signed)
 Per request, 11/11/2022 office notes faxed to [facility]; [phone]

## 2023-11-14 ENCOUNTER — Encounter: Payer: Self-pay | Admitting: Hematology and Oncology

## 2023-11-14 ENCOUNTER — Ambulatory Visit (INDEPENDENT_AMBULATORY_CARE_PROVIDER_SITE_OTHER): Admitting: Surgery

## 2023-11-14 ENCOUNTER — Encounter: Payer: Self-pay | Admitting: Surgery

## 2023-11-14 VITALS — BP 164/82 | HR 70 | Temp 98.2°F | Ht 62.0 in | Wt 100.4 lb

## 2023-11-14 DIAGNOSIS — Z86000 Personal history of in-situ neoplasm of breast: Secondary | ICD-10-CM | POA: Diagnosis not present

## 2023-11-14 DIAGNOSIS — Z09 Encounter for follow-up examination after completed treatment for conditions other than malignant neoplasm: Secondary | ICD-10-CM

## 2023-11-14 DIAGNOSIS — D0512 Intraductal carcinoma in situ of left breast: Secondary | ICD-10-CM

## 2023-11-14 NOTE — Patient Instructions (Addendum)
 Please call the office if you have any questions or concerns    We will see you back in one year. We placed you in our recall system. We will send you a letter with an appointment for your mammogram and to come back to see Dr Dana Duncan.

## 2023-11-15 NOTE — Progress Notes (Signed)
 Outpatient Surgical Follow Up    Sabrina Zamora is an 89 y.o. female.   Chief Complaint  Patient presents with   Follow-up    Mammogram    Sabrina Zamora is an 89 year old female status post lumpectomy 11/2020 for DCIS. She did receive adjuvant XRT  Evidence of microinvasion and patient declined sentinel node biopsy.  She is doing very well.  She recently had a mammogram that have personally reviewed showing no evidence of suspicious lesions on either breast. She als had lund lesion that was radiated. Tolerating tamoxifen , she still drives and is is reasonable health HPI:   Past Medical History:  Diagnosis Date   Asthma    GERD (gastroesophageal reflux disease)    High cholesterol    Hypertension    Hypothyroidism    Melanoma (HCC) 1986, 2008, 2016, 2021   melenoma left arm, left leg, back, back respectively   Osteoporosis    Polycythemia vera (HCC)    Skin cancer 2012, 2016   Sleep apnea     Past Surgical History:  Procedure Laterality Date   BREAST BIOPSY Left 2003   neg   BREAST BIOPSY Right 09/27/2017   12:00 1 cmfn fibroadenomatous change    BREAST BIOPSY Right 09/17/2017   12:00 3 cmfn benign breast and fibroadipose tissue   BREAST BIOPSY Left 10/14/2020   stereo biopsy, ribbon clip/ INTERMEDIATE GRADE DUCTAL CARCINOMA IN SITU    BREAST EXCISIONAL BIOPSY Left 1988   excisional - neg   BREAST LUMPECTOMY Left 11/25/2020   BREAST LUMPECTOMY WITH RADIOFREQUENCY TAG IDENTIFICATION Left 11/25/2020   Procedure: BREAST LUMPECTOMY WITH RADIOFREQUENCY TAG IDENTIFICATION;  Surgeon: Alben Alma, MD;  Location: [facility];  Service: General;  Laterality: Left;   CATARACT EXTRACTION W/PHACO Left 09/20/2017   Procedure: CATARACT EXTRACTION PHACO AND INTRAOCULAR LENS PLACEMENT (IOC) LEFT;  Surgeon: Annell Kidney, MD;  Location: [facility];  Service: Ophthalmology;  Laterality: Left;   CATARACT EXTRACTION W/PHACO Right 02/15/2018   Procedure: CATARACT EXTRACTION PHACO AND  INTRAOCULAR LENS PLACEMENT (IOC) TOPICAL  RIGHT;  Surgeon: Annell Kidney, MD;  Location: [facility];  Service: Ophthalmology;  Laterality: Right;  prefers early   CHOLECYSTECTOMY     EYE SURGERY      Family History  Problem Relation Age of Onset   Clotting disorder Sister    Breast cancer Sister 54   Heart disease Mother    Heart disease Father     Social History:  reports that she has never smoked. She has never used smokeless tobacco. She reports that she does not drink alcohol and does not use drugs.  Allergies:  Allergies  Allergen Reactions   Iodine Shortness Of Breath and Cough    Patient states she had a reaction with dose of contrast in 10/2021   Levofloxacin Rash and Shortness Of Breath   Penicillins Hives and Rash   Sulfa Antibiotics Rash   Tobramycin Other (See Comments)    Unknown   Betadine [Povidone Iodine] Rash   Ciprofloxacin Itching and Rash    Medications reviewed.    ROS Full ROS performed and is otherwise negative other than what is stated in HPI   BP (!) 164/82   Pulse 70   Temp 98.2 F (36.8 C) (Oral)   Ht 5\' 2"  (1.575 m)   Wt 100 lb 6.4 oz (45.5 kg)   SpO2 99%   BMI 18.36 kg/m   Physical Exam   Physical Exam  Physical Exam Vitals and nursing note reviewed.  Exam conducted with a chaperone present.  Constitutional:      General: She is not in acute distress.    Appearance: Normal appearance. She is normal weight. She is not ill-appearing.  Eyes:     General: No scleral icterus.       Right eye: No discharge.        Left eye: No discharge.  Cardiovascular:     Rate and Rhythm: Normal rate and regular rhythm.     Heart sounds: No murmur heard.   No friction rub.  Pulmonary:     Effort: Pulmonary effort is normal. No respiratory distress.     Breath sounds: Normal breath sounds. No stridor. No wheezing.     Comments: Breast: Evidence of prior lumpectomy in the left side there is no evidence of any significant  deformity on the left breast or any new palpable lesions.  There is no evidence of lymphadenopathy on either side.  Evidence of radiation changes. She doers have prior evidence of melanoma surgery left lateral and posterior chest wall Abdominal:     General: Abdomen is flat. There is no distension.     Palpations: There is no mass.     Tenderness: There is no abdominal tenderness. There is no guarding or rebound.     Hernia: No hernia is present.  Musculoskeletal:        General: No swelling, tenderness or deformity. Normal range of motion.     Cervical back: Normal range of motion and neck supple. No rigidity or tenderness.  Lymphadenopathy:     Cervical: No cervical adenopathy.  Skin:    General: Skin is warm and dry.     Capillary Refill: Capillary refill takes less than 2 seconds.     Coloration: Skin is not jaundiced or pale.     Findings: No bruising or erythema.  Neurological:     General: No focal deficit present.     Mental Status: She is alert and oriented to person, place, and time.  Psychiatric:        Mood and Affect: Mood normal.        Behavior: Behavior normal.        Thought Content: Thought content normal.        Judgment: Judgment normal.    Assessment/Plan: 89 year old female with history of DCIS in the left breast status postlumpectomy and radiation therapy.  No evidence of recurrence. No evidence of complications or new breast issues. SHe is doing very well  She wishes me to see her in 1 year with repeat mammogram.  I spent  30 minutes including personally reviewing imaging studies, placing orders, counseling the patient and performing appropriate documentation     Evelia Hipp, MD [facility] General Surgeon

## 2023-11-17 ENCOUNTER — Encounter: Payer: Self-pay | Admitting: Hematology and Oncology

## 2023-11-17 ENCOUNTER — Encounter: Payer: Self-pay | Admitting: Physician Assistant

## 2023-11-17 ENCOUNTER — Ambulatory Visit (INDEPENDENT_AMBULATORY_CARE_PROVIDER_SITE_OTHER): Payer: Medicare Other | Admitting: Physician Assistant

## 2023-11-17 VITALS — BP 132/82 | HR 74 | Temp 97.8°F | Resp 16 | Ht 62.5 in | Wt 102.0 lb

## 2023-11-17 DIAGNOSIS — I10 Essential (primary) hypertension: Secondary | ICD-10-CM

## 2023-11-17 DIAGNOSIS — R636 Underweight: Secondary | ICD-10-CM

## 2023-11-17 DIAGNOSIS — D0512 Intraductal carcinoma in situ of left breast: Secondary | ICD-10-CM | POA: Diagnosis not present

## 2023-11-17 NOTE — Progress Notes (Signed)
 [facility] [address] [place], [place] [address]  Internal MEDICINE  Office Visit Note  Patient Name: Sabrina Zamora  [id number]  [id number]  Date of Service: 11/17/2023  Chief Complaint  Patient presents with   Follow-up   Gastroesophageal Reflux   Asthma    HPI Pt is here for routine follow up -BP stable -Is using cpap nightly now and reports oncology is happy with her hemoglobin now as it has come down since using PAP again -using docusate sometimes, trying to increase water intake as well -weight is still low, but up 3lbs since last visit -Followed by oncology/GS or hx of DCIS and reports having recent mammogram  Current Medication: Outpatient Encounter Medications as of 11/17/2023  Medication Sig   amLODipine  (NORVASC ) 2.5 MG tablet TAKE ONE TABLET (2.5 MG) BY MOUTH EVERY DAY   aspirin EC 81 MG tablet Take 81 mg by mouth 3 (three) times a week.   Cholecalciferol (VITAMIN D ) 50 MCG (2000 UT) CAPS Take 2,000 Units by mouth daily.   denosumab  (PROLIA ) 60 MG/ML SOSY injection Inject 60 mg into the skin every 6 (six) months.   fluticasone -salmeterol (ADVAIR ) 250-50 MCG/ACT AEPB INHALE 1 PUFF TWICE A DAY AS DIRECTED **RINSE MOUTH AFTER USE**   levothyroxine  (SYNTHROID ) 25 MCG tablet TAKE ONE AND ONE-HALF TABLETS DAILY BEFORE BREAKFAST   lovastatin  (MEVACOR ) 20 MG tablet TAKE 1 TABLET BY MOUTH NIGHTLY   Multiple Vitamin (MULTI-VITAMINS) TABS Take 1 tablet by mouth daily.   mupirocin  nasal ointment (BACTROBAN ) 2 % Place 1 application into the nose at bedtime.   tamoxifen  (NOLVADEX ) 20 MG tablet TAKE 1 TABLET BY MOUTH DAILY   Zinc 25 MG TABS Take 25 mg by mouth daily.   No facility-administered encounter medications on file as of 11/17/2023.    Surgical History: Past Surgical History:  Procedure Laterality Date   BREAST BIOPSY Left 2003   neg   BREAST BIOPSY Right 09/27/2017   12:00 1 cmfn fibroadenomatous change    BREAST BIOPSY Right 09/17/2017   12:00 3  cmfn benign breast and fibroadipose tissue   BREAST BIOPSY Left 10/14/2020   stereo biopsy, ribbon clip/ INTERMEDIATE GRADE DUCTAL CARCINOMA IN SITU    BREAST EXCISIONAL BIOPSY Left 1988   excisional - neg   BREAST LUMPECTOMY Left 11/25/2020   BREAST LUMPECTOMY WITH RADIOFREQUENCY TAG IDENTIFICATION Left 11/25/2020   Procedure: BREAST LUMPECTOMY WITH RADIOFREQUENCY TAG IDENTIFICATION;  Surgeon: Alben Alma, MD;  Location: [facility] ORS;  Service: General;  Laterality: Left;   CATARACT EXTRACTION W/PHACO Left 09/20/2017   Procedure: CATARACT EXTRACTION PHACO AND INTRAOCULAR LENS PLACEMENT (IOC) LEFT;  Surgeon: Annell Kidney, MD;  Location: [facility];  Service: Ophthalmology;  Laterality: Left;   CATARACT EXTRACTION W/PHACO Right 02/15/2018   Procedure: CATARACT EXTRACTION PHACO AND INTRAOCULAR LENS PLACEMENT (IOC) TOPICAL  RIGHT;  Surgeon: Annell Kidney, MD;  Location: [facility];  Service: Ophthalmology;  Laterality: Right;  prefers early   CHOLECYSTECTOMY     EYE SURGERY      Medical History: Past Medical History:  Diagnosis Date   Asthma    GERD (gastroesophageal reflux disease)    High cholesterol    Hypertension    Hypothyroidism    Melanoma (HCC) 1986, 2008, 2016, 2021   melenoma left arm, left leg, back, back respectively   Osteoporosis    Polycythemia vera (HCC)    Skin cancer 2012, 2016   Sleep apnea     Family History: Family History  Problem  Relation Age of Onset   Clotting disorder Sister    Breast cancer Sister 32   Heart disease Mother    Heart disease Father     Social History   Socioeconomic History   Marital status: Married    Spouse name: Sammie Crigler   Number of children: 3   Years of education: Not on file   Highest education level: Not on file  Occupational History   Not on file  Tobacco Use   Smoking status: Never   Smokeless tobacco: Never  Vaping Use   Vaping status: Never Used  Substance and Sexual Activity    Alcohol use: Never    Alcohol/week: 0.0 standard drinks of alcohol   Drug use: Never   Sexual activity: Not on file  Other Topics Concern   Not on file  Social History Narrative   Not on file   Social Drivers of Health   Financial Resource Strain: Not on file  Food Insecurity: Not on file  Transportation Needs: Not on file  Physical Activity: Not on file  Stress: Not on file  Social Connections: Not on file  Intimate Partner Violence: Not on file      Review of Systems  Constitutional:  Negative for chills, fatigue and unexpected weight change.  HENT:  Negative for congestion, rhinorrhea, sneezing and sore throat.   Eyes:  Negative for redness.  Respiratory:  Negative for cough, chest tightness and shortness of breath.   Cardiovascular:  Negative for chest pain and palpitations.  Gastrointestinal:  Positive for constipation. Negative for abdominal pain, diarrhea, nausea and vomiting.  Genitourinary:  Negative for dysuria and frequency.  Musculoskeletal:  Negative for arthralgias, back pain, joint swelling and neck pain.  Skin:  Negative for rash.  Neurological: Negative.  Negative for tremors and numbness.  Hematological:  Negative for adenopathy. Does not bruise/bleed easily.  Psychiatric/Behavioral:  Negative for behavioral problems (Depression), sleep disturbance and suicidal ideas. The patient is not nervous/anxious.     Vital Signs: BP 132/82   Pulse 74   Temp 97.8 F (36.6 C)   Resp 16   Ht 5' 2.5" (1.588 m)   Wt 102 lb (46.3 kg)   SpO2 97%   BMI 18.36 kg/m    Physical Exam Vitals and nursing note reviewed.  Constitutional:      Appearance: Normal appearance. She is normal weight.  HENT:     Head: Normocephalic and atraumatic.  Cardiovascular:     Rate and Rhythm: Normal rate and regular rhythm.     Pulses: Normal pulses.     Heart sounds: Normal heart sounds.  Pulmonary:     Effort: Pulmonary effort is normal.     Breath sounds: Normal breath  sounds.  Musculoskeletal:        General: Normal range of motion.     Cervical back: Normal range of motion.  Skin:    General: Skin is warm.  Neurological:     General: No focal deficit present.     Mental Status: She is alert and oriented to person, place, and time. Mental status is at baseline.  Psychiatric:        Mood and Affect: Mood normal.        Behavior: Behavior normal.        Thought Content: Thought content normal.        Judgment: Judgment normal.        Assessment/Plan: 1. Essential hypertension (Primary) Stable, continue current medication  2. Underweight Up 3  lbs since last visit and encouraged to keep up protein intake as well as stay hydrated  3. Ductal carcinoma in situ (DCIS) of left breast Followed by oncology/GS   General Counseling: Ramandeep verbalizes understanding of the findings of todays visit and agrees with plan of treatment. I have discussed any further diagnostic evaluation that may be needed or ordered today. We also reviewed her medications today. she has been encouraged to call the office with any questions or concerns that should arise related to todays visit.    No orders of the defined types were placed in this encounter.   No orders of the defined types were placed in this encounter.   This patient was seen by Taylor Favia, PA-C in collaboration with Dr. Verneta Gone as a part of collaborative care agreement.   Total time spent:30 Minutes Time spent includes review of chart, medications, test results, and follow up plan with the patient.      Dr Fozia M Khan Internal medicine

## 2023-11-23 ENCOUNTER — Ambulatory Visit
Admission: RE | Admit: 2023-11-23 | Discharge: 2023-11-23 | Disposition: A | Discharge status: Home / Self Care | Payer: Medicare Other | Source: Ambulatory Visit | Attending: Radiation Oncology | Admitting: Radiation Oncology

## 2023-11-23 ENCOUNTER — Encounter: Payer: Self-pay | Admitting: Hematology and Oncology

## 2023-11-23 DIAGNOSIS — J479 Bronchiectasis, uncomplicated: Secondary | ICD-10-CM | POA: Diagnosis not present

## 2023-11-23 DIAGNOSIS — Z85118 Personal history of other malignant neoplasm of bronchus and lung: Secondary | ICD-10-CM | POA: Diagnosis not present

## 2023-11-23 DIAGNOSIS — R911 Solitary pulmonary nodule: Secondary | ICD-10-CM | POA: Diagnosis not present

## 2023-11-23 DIAGNOSIS — I700 Atherosclerosis of aorta: Secondary | ICD-10-CM | POA: Diagnosis not present

## 2023-11-28 ENCOUNTER — Other Ambulatory Visit: Payer: Self-pay | Admitting: Physician Assistant

## 2023-11-30 ENCOUNTER — Other Ambulatory Visit: Payer: Self-pay | Admitting: *Deleted

## 2023-11-30 ENCOUNTER — Encounter: Payer: Self-pay | Admitting: Radiation Oncology

## 2023-11-30 ENCOUNTER — Ambulatory Visit
Admission: RE | Admit: 2023-11-30 | Discharge: 2023-11-30 | Disposition: A | Discharge status: Home / Self Care | Payer: Medicare Other | Source: Ambulatory Visit | Attending: Radiation Oncology | Admitting: Radiation Oncology

## 2023-11-30 VITALS — BP 152/102 | HR 81 | Temp 98.0°F | Resp 14 | Ht 62.5 in | Wt 100.0 lb

## 2023-11-30 DIAGNOSIS — Z85828 Personal history of other malignant neoplasm of skin: Secondary | ICD-10-CM | POA: Diagnosis not present

## 2023-11-30 DIAGNOSIS — Z923 Personal history of irradiation: Secondary | ICD-10-CM | POA: Diagnosis not present

## 2023-11-30 DIAGNOSIS — D2261 Melanocytic nevi of right upper limb, including shoulder: Secondary | ICD-10-CM | POA: Diagnosis not present

## 2023-11-30 DIAGNOSIS — L821 Other seborrheic keratosis: Secondary | ICD-10-CM | POA: Diagnosis not present

## 2023-11-30 DIAGNOSIS — R918 Other nonspecific abnormal finding of lung field: Secondary | ICD-10-CM

## 2023-11-30 DIAGNOSIS — Z08 Encounter for follow-up examination after completed treatment for malignant neoplasm: Secondary | ICD-10-CM | POA: Diagnosis not present

## 2023-11-30 DIAGNOSIS — Z85118 Personal history of other malignant neoplasm of bronchus and lung: Secondary | ICD-10-CM | POA: Diagnosis not present

## 2023-11-30 DIAGNOSIS — Z86000 Personal history of in-situ neoplasm of breast: Secondary | ICD-10-CM | POA: Diagnosis not present

## 2023-11-30 DIAGNOSIS — Z85820 Personal history of malignant melanoma of skin: Secondary | ICD-10-CM | POA: Diagnosis not present

## 2023-11-30 DIAGNOSIS — D225 Melanocytic nevi of trunk: Secondary | ICD-10-CM | POA: Diagnosis not present

## 2023-11-30 DIAGNOSIS — Z872 Personal history of diseases of the skin and subcutaneous tissue: Secondary | ICD-10-CM | POA: Diagnosis not present

## 2023-11-30 DIAGNOSIS — C3432 Malignant neoplasm of lower lobe, left bronchus or lung: Secondary | ICD-10-CM | POA: Diagnosis not present

## 2023-11-30 DIAGNOSIS — D2271 Melanocytic nevi of right lower limb, including hip: Secondary | ICD-10-CM | POA: Diagnosis not present

## 2023-11-30 DIAGNOSIS — D2262 Melanocytic nevi of left upper limb, including shoulder: Secondary | ICD-10-CM | POA: Diagnosis not present

## 2023-11-30 DIAGNOSIS — D2272 Melanocytic nevi of left lower limb, including hip: Secondary | ICD-10-CM | POA: Diagnosis not present

## 2023-11-30 DIAGNOSIS — Z09 Encounter for follow-up examination after completed treatment for conditions other than malignant neoplasm: Secondary | ICD-10-CM | POA: Diagnosis not present

## 2023-11-30 NOTE — Progress Notes (Signed)
 Radiation Oncology Follow up Note  Name: Sabrina Zamora   Date:   11/30/2023 MRN:  [id number] DOB: 08-Oct-1930    This 89 y.o. female presents to the clinic today for 39-month follow-up status post SBRT to her left lower lobe for stage I non-small cell lung cancer and patient previously treated back in 22 for DCIS of the breast.  REFERRING PROVIDER: Jacques Mattock, PA*  HPI: Patient is a 89 year old female now out 28 months having completed SBRT to her left lower lobe for stage I non-small cell lung cancer.  Seen today in routine follow-up she is doing well.  She specifically [place] cough hemoptysis chest tightness dysphagia or any change in her pulmonary status.  I repeated her CT scan of her chest.  Which shows stable postradiation changes in the left perihilar region without evidence of recurrent or progressive disease.  COMPLICATIONS OF TREATMENT: none  FOLLOW UP COMPLIANCE: keeps appointments   PHYSICAL EXAM:  BP (!) 152/102   Pulse 81   Temp 98 F (36.7 C)   Resp 14   Ht 5' 2.5" (1.588 m)   Wt 100 lb (45.4 kg)   BMI 18.00 kg/m  Well-developed well-nourished patient in NAD. HEENT reveals PERLA, EOMI, discs not visualized.  Oral cavity is clear. No oral mucosal lesions are identified. Neck is clear without evidence of cervical or supraclavicular adenopathy. Lungs are clear to A&P. Cardiac examination is essentially unremarkable with regular rate and rhythm without murmur rub or thrill. Abdomen is benign with no organomegaly or masses noted. Motor sensory and DTR levels are equal and symmetric in the upper and lower extremities. Cranial nerves II through XII are grossly intact. Proprioception is intact. No peripheral adenopathy or edema is identified. No motor or sensory levels are noted. Crude visual fields are within normal range.  RADIOLOGY RESULTS: CT scan and serial CT scans reviewed compatible with above-stated findings  PLAN: At the present time patient is doing well with  no evidence of progressive disease now out 28 months from SBRT.  I will see her 1 more time in a year and then turn follow-up care to her primary care doctor as well as medical oncology.  Patient comprehends recommendations well she knows to call with any concerns.  I would like to take this opportunity to thank you for allowing me to participate in the care of your patient.Glenis Langdon, MD

## 2023-12-19 ENCOUNTER — Other Ambulatory Visit: Payer: Self-pay | Admitting: Physician Assistant

## 2024-02-07 DIAGNOSIS — M810 Age-related osteoporosis without current pathological fracture: Secondary | ICD-10-CM | POA: Diagnosis not present

## 2024-03-07 ENCOUNTER — Inpatient Hospital Stay: Payer: Medicare Other | Attending: Oncology

## 2024-03-07 ENCOUNTER — Inpatient Hospital Stay (HOSPITAL_BASED_OUTPATIENT_CLINIC_OR_DEPARTMENT_OTHER): Payer: Medicare Other | Admitting: Oncology

## 2024-03-07 ENCOUNTER — Encounter: Payer: Self-pay | Admitting: Oncology

## 2024-03-07 VITALS — BP 159/78 | HR 73 | Temp 98.4°F | Resp 18 | Wt 102.7 lb

## 2024-03-07 DIAGNOSIS — D0512 Intraductal carcinoma in situ of left breast: Secondary | ICD-10-CM | POA: Diagnosis present

## 2024-03-07 DIAGNOSIS — C50912 Malignant neoplasm of unspecified site of left female breast: Secondary | ICD-10-CM | POA: Diagnosis not present

## 2024-03-07 DIAGNOSIS — Z85820 Personal history of malignant melanoma of skin: Secondary | ICD-10-CM | POA: Insufficient documentation

## 2024-03-07 DIAGNOSIS — Z803 Family history of malignant neoplasm of breast: Secondary | ICD-10-CM | POA: Diagnosis not present

## 2024-03-07 DIAGNOSIS — D751 Secondary polycythemia: Secondary | ICD-10-CM | POA: Insufficient documentation

## 2024-03-07 DIAGNOSIS — R911 Solitary pulmonary nodule: Secondary | ICD-10-CM | POA: Diagnosis not present

## 2024-03-07 DIAGNOSIS — Z7982 Long term (current) use of aspirin: Secondary | ICD-10-CM | POA: Diagnosis not present

## 2024-03-07 LAB — CBC WITH DIFFERENTIAL (CANCER CENTER ONLY)
Abs Immature Granulocytes: 0.04 K/uL (ref 0.00–0.07)
Basophils Absolute: 0 K/uL (ref 0.0–0.1)
Basophils Relative: 1 %
Eosinophils Absolute: 0.1 K/uL (ref 0.0–0.5)
Eosinophils Relative: 2 %
HCT: 45 % (ref 36.0–46.0)
Hemoglobin: 14.7 g/dL (ref 12.0–15.0)
Immature Granulocytes: 1 %
Lymphocytes Relative: 30 %
Lymphs Abs: 1.7 K/uL (ref 0.7–4.0)
MCH: 31.7 pg (ref 26.0–34.0)
MCHC: 32.7 g/dL (ref 30.0–36.0)
MCV: 97 fL (ref 80.0–100.0)
Monocytes Absolute: 0.7 K/uL (ref 0.1–1.0)
Monocytes Relative: 11 %
Neutro Abs: 3.2 K/uL (ref 1.7–7.7)
Neutrophils Relative %: 55 %
Platelet Count: 176 K/uL (ref 150–400)
RBC: 4.64 MIL/uL (ref 3.87–5.11)
RDW: 13.2 % (ref 11.5–15.5)
WBC Count: 5.7 K/uL (ref 4.0–10.5)
nRBC: 0 % (ref 0.0–0.2)

## 2024-03-07 LAB — CMP (CANCER CENTER ONLY)
ALT: 18 U/L (ref 0–44)
AST: 26 U/L (ref 15–41)
Albumin: 3.5 g/dL (ref 3.5–5.0)
Alkaline Phosphatase: 37 U/L — ABNORMAL LOW (ref 38–126)
Anion gap: 6 (ref 5–15)
BUN: 20 mg/dL (ref 8–23)
CO2: 26 mmol/L (ref 22–32)
Calcium: 9.1 mg/dL (ref 8.9–10.3)
Chloride: 105 mmol/L (ref 98–111)
Creatinine: 0.73 mg/dL (ref 0.44–1.00)
GFR, Estimated: >60 mL/min (ref >60)
Glucose, Bld: 132 mg/dL — ABNORMAL HIGH (ref 70–99)
Potassium: 4.9 mmol/L (ref 3.5–5.1)
Sodium: 137 mmol/L (ref 135–145)
Total Bilirubin: 0.5 mg/dL (ref 0.0–1.2)
Total Protein: 6.5 g/dL (ref 6.5–8.1)

## 2024-03-07 NOTE — Progress Notes (Signed)
 Hematology/Oncology Progress note [phone] [phone]    Chief Complaint: secondary polycythemia, lung nodule [presumed lung cancer], left breast DCIS with microinvasive component.   ASSESSMENT & PLAN:   Ductal carcinoma in situ (DCIS) of left breast with microinvasive component (HCC) #Left breast DCIS with microinvasive component, pTMi pN0 Status post lumpectomy.  No sentinel lymph node biopsy was done. S/p RT Labs reviewed and discussed with patient. Patient remains in good condition for her age.  She desires to follow standard care recommendation. Continue tamoxifen  20 mg daily.  Plan 5 years-- end August 2027 Continue asprin 81mg  daily for thrombosis prophylaxis.  annual bilateral diagnostic mammogram in April 2026 I recommend annual pelvic examination while on tamoxifen . Follow up annually with gynecologist.  Lung nodule #Lung nodule found on chest x-ray and confirmed on CT scan, no FDG activity on PET scan.  Presumed lung cancer. Patient is status post SBRT finished in December 2023 Surveillance CT scan was ordered and  scheduled via radiation oncologist office.  Orders Placed This Encounter  Procedures   CBC with Differential (Cancer Center Only)    Standing Status:   Future    Expected Date:   09/07/2024    Expiration Date:   12/06/2024   CMP (Cancer Center only)    Standing Status:   Future    Expected Date:   09/07/2024    Expiration Date:   12/06/2024   Follow up in 6 months.  All questions were answered. The patient knows to call the clinic with any problems, questions or concerns.  Zelphia Cap, MD, PhD [facility] Hematology Oncology 03/07/2024   Ductal carcinoma in situ (DCIS) of left breast with microinvasive component (HCC) #Left breast DCIS with microinvasive component, pTMi pN0 Status post lumpectomy.  No sentinel lymph node biopsy was done. S/p RT Labs reviewed and discussed with patient. Patient remains in good condition for her age.   She desires to follow standard care recommendation. Continue tamoxifen  20 mg daily.  Plan 5 years-- end August 2027 Continue asprin 81mg  daily for thrombosis prophylaxis.  annual bilateral diagnostic mammogram in April 2026 I recommend annual pelvic examination while on tamoxifen . Follow up annually with gynecologist.  Lung nodule #Lung nodule found on chest x-ray and confirmed on CT scan, no FDG activity on PET scan.  Patient is status post SBRT finished in December 2023 10/30/2021, CT chest with contrast showed radiation changes.  Small left pleural effusion. surveillance CT scan was ordered and  scheduled via radiation oncologist office.  Orders Placed This Encounter  Procedures   CBC with Differential (Cancer Center Only)    Standing Status:   Future    Expected Date:   09/07/2024    Expiration Date:   12/06/2024   CMP (Cancer Center only)    Standing Status:   Future    Expected Date:   09/07/2024    Expiration Date:   12/06/2024   Follow-up in 6 months. All questions were answered. The patient knows to call the clinic with any problems, questions or concerns.  Zelphia Cap, MD, PhD [facility] Hematology Oncology 03/07/2024   HPI:  Patient previously followed up with Dr. Rudell.  Patient switched care to me on 11/29/2018. Extensive chart review was performed.  #Secondary polycythemia Patient has a history of sleep apnea she wears oxygen  at night but no CPAP mask.  Never smoker. Work-up on 01/11/2012 reviewed and negative Jak 2 V617F mutation, exon 12 mutation. 08/25/2018 CALR negative, JAK 2 E12-15 negative, MPL negative.  Patient has been on phlebotomy program to keep hematocrit less than 48 and then later to keep it less than 50.  #History of melanoma x3.  T1 a melanoma in August 2012. saw Dermatology and had topical chemotherapy treatment to her forehead lesion.   #She has been compliant with CPAP machine. She did nocturnal oxygen  testing and she is not qualified for nocturnal  oxygen .   # Lung nodule -presumed lung cancer. She is a never smoker.  Report second hand smoke exposure when she grew up and during the initial few years of her marriage.    Her husband stopped smoking in 1960s.   # 06/26/2020, CT without contrast showed a partial solid nodule within the superior segment of the left lower pole.  This measures 2.4 cm with a solid component measuring 5 mm.  Bronchiectasis with tree-in-bud nodularity is noted within the anterior and posterior basal portions of the right upper lobe.  10/06/2020 CT chest wo contrast - mixed solid and ground-glass nodule of  the anterior superior segment left lower lobe, with tenting of the adjacent fissural pleura. The overall dimensions are approximately 2.8 x 2.4 cm, previously 2.6 x 2.3 cm when measured similarly, with nodular components measuring approximately 0.5 cm and 0.7 cm. Central nodular components appear somewhat more solid than on comparison prior although are not substantially changed in size a new 5 mm pulmonary nodule of the lateral segment right middle lobe, nonspecific.Stable 4 mm nodule of the anterior right lower lobe, which remains nonspecific.4. There are numerous additional unchanged clustered and tree-in-bud centrilobular nodules, particularly in the posterior right upper lobe. 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  Patient's case was discussed on tumor board on 10/15/2020.  Consensus reached about proceed with biopsy versus PET scan+ empiric SBRT given her age. 10/28/2020, PET scan did not show activity of the left lower lobe nodule.  However her tumor board discussion, CT appearance is indicated for of lung cancer.  Patient was referred to establish care with Dr. Lenn radiation oncology and was evaluated on 11/03/2020.  RadOnc recommend watchful waiting for the time being.  Should the nodule continues to solidify and grow patient will be offered empirically SBRT at that  time.  05/04/2021, CT chest showed continued slow growth of left lower lobe lung lesion. 06/23/2021, patient finished SBRT to left lower lung lesion.   Left breast DCIS with microinvasive component 09/25/20 screening mammogram showed possible distortion and calcifications in the left breast.  10/08/2020 left diagnostic mammogram showed Grouped microcalcifications in the left breast, Four circumscribed hypoechoic masses in the left breast may represent complicated cysts and are probably benign.  10/14/2020 left breast upper outer quadrant at middle depth-- intermediate grade DCIS, 5mm, with microcalcifications.  11/25/2020, patient underwent left lumpectomy by Dr. Jordis There is microinvasive mammary carcinoma with high-grade DCIS.  Negative margins.  pTmi pNx Given her advanced age, multiple other medical conditions, strongly ER positivity, informed decision was made to omit sentinel lymph node biopsy. Case was discussed on breast cancer tumor board.  01/14/2021 finished radiation.  02/03/2021 started on tamoxifen   INTERVAL HISTORY Sabrina Zamora is a 89 y.o. female who has above history reviewed by me today presents for follow up visit for erythrocytosis, lung nodule, left breast DCIS with microinvasive component Patient tolerates tamoxifen  well.  No new breast concerns. She has used her CPAP machine for sleep apnea.    Review of Systems  Constitutional:  Negative for appetite change, chills, fatigue and fever.  HENT:   Negative for hearing loss and voice change.   Eyes:  Negative for eye problems.  Respiratory:  Negative for chest tightness and cough.   Cardiovascular:  Negative for chest pain.  Gastrointestinal:  Negative for abdominal distention, abdominal pain and blood in stool.  Endocrine: Negative for hot flashes.  Genitourinary:  Negative for difficulty urinating and frequency.   Musculoskeletal:  Negative for arthralgias.  Skin:  Negative for itching and rash.  Neurological:   Negative for extremity weakness.  Hematological:  Negative for adenopathy.  Psychiatric/Behavioral:  Negative for confusion.     Past Medical History:  Diagnosis Date   Asthma    GERD (gastroesophageal reflux disease)    High cholesterol    Hypertension    Hypothyroidism    Melanoma (HCC) 1986, 2008, 2016, 2021   melenoma left arm, left leg, back, back respectively   Osteoporosis    Polycythemia vera (HCC)    Skin cancer 2012, 2016   Sleep apnea     Past Surgical History:  Procedure Laterality Date   BREAST BIOPSY Left 2003   neg   BREAST BIOPSY Right 09/27/2017   12:00 1 cmfn fibroadenomatous change    BREAST BIOPSY Right 09/17/2017   12:00 3 cmfn benign breast and fibroadipose tissue   BREAST BIOPSY Left 10/14/2020   stereo biopsy, ribbon clip/ INTERMEDIATE GRADE DUCTAL CARCINOMA IN SITU    BREAST EXCISIONAL BIOPSY Left 1988   excisional - neg   BREAST LUMPECTOMY Left 11/25/2020   BREAST LUMPECTOMY WITH RADIOFREQUENCY TAG IDENTIFICATION Left 11/25/2020   Procedure: BREAST LUMPECTOMY WITH RADIOFREQUENCY TAG IDENTIFICATION;  Surgeon: Jordis Laneta FALCON, MD;  Location: [facility] OR[facility];  Service: General;  Laterality: Left;   CATARACT EXTRACTION W/PHACO Left 09/20/2017   Procedure: CATARACT EXTRACTION PHACO AND INTRAOCULAR LENS PLACEMENT (IOC) LEFT;  Surgeon: Mittie Gaskin, MD;  Location: [facility];  Service: Ophthalmology;  Laterality: Left;   CATARACT EXTRACTION W/PHACO Right 02/15/2018   Procedure: CATARACT EXTRACTION PHACO AND INTRAOCULAR LENS PLACEMENT (IOC) TOPICAL  RIGHT;  Surgeon: Mittie Gaskin, MD;  Location: [facility];  Service: Ophthalmology;  Laterality: Right;  prefers early   CHOLECYSTECTOMY     EYE SURGERY      Family History  Problem Relation Age of Onset   Clotting disorder Sister    Breast cancer Sister 78   Heart disease Mother    Heart disease Father     Social History:  reports that she has never smoked. She has never used  smokeless tobacco. She reports that she does not drink alcohol and does not use drugs.  She has never smoked.  The patient is alone today.  Allergies:  Allergies  Allergen Reactions   Iodine Shortness Of Breath and Cough    Patient states she had a reaction with dose of contrast in 10/2021   Levofloxacin Rash and Shortness Of Breath   Penicillins Hives and Rash   Sulfa Antibiotics Rash   Tobramycin Other (See Comments)    Unknown   Betadine [Povidone Iodine] Rash   Ciprofloxacin Itching and Rash    Current Medications: Current Outpatient Medications  Medication Sig Dispense Refill   amLODipine  (NORVASC ) 2.5 MG tablet TAKE ONE TABLET (2.5 MG) BY MOUTH ONCE DAILY 45 tablet 3   aspirin EC 81 MG tablet Take 81 mg by mouth 3 (three) times a week.     Cholecalciferol (VITAMIN D ) 50 MCG (2000 UT) CAPS Take 2,000 Units by mouth daily.     denosumab  (PROLIA )  60 MG/ML SOSY injection Inject 60 mg into the skin every 6 (six) months.     fluticasone -salmeterol (ADVAIR ) 250-50 MCG/ACT AEPB INHALE 1 PUFF TWICE A DAY AS DIRECTED **RINSE MOUTH AFTER USE** 180 each 1   levothyroxine  (SYNTHROID ) 25 MCG tablet TAKE ONE AND ONE-HALF TABLETS DAILY BEFORE BREAKFAST 135 tablet 3   lovastatin  (MEVACOR ) 20 MG tablet TAKE 1 TABLET BY MOUTH NIGHTLY 90 tablet 2   Multiple Vitamin (MULTI-VITAMINS) TABS Take 1 tablet by mouth daily.     mupirocin  nasal ointment (BACTROBAN ) 2 % Place 1 application into the nose at bedtime. 10 g 1   tamoxifen  (NOLVADEX ) 20 MG tablet TAKE 1 TABLET BY MOUTH DAILY 90 tablet 1   Zinc 25 MG TABS Take 25 mg by mouth daily.     No current facility-administered medications for this visit.    Physical Exam: Blood pressure (!) 159/78, pulse 73, temperature 98.4 F (36.9 C), temperature source Tympanic, resp. rate 18, weight 102 lb 11.2 oz (46.6 kg), SpO2 98%.  Physical Exam Constitutional:      General: She is not in acute distress.    Appearance: Normal appearance. She is not  diaphoretic.     Comments: Thin built, walks independently.   HENT:     Head: Normocephalic and atraumatic.  Eyes:     General: No scleral icterus. Cardiovascular:     Rate and Rhythm: Normal rate and regular rhythm.  Pulmonary:     Effort: Pulmonary effort is normal. No respiratory distress.     Breath sounds: No wheezing.  Abdominal:     General: There is no distension.     Palpations: Abdomen is soft.  Musculoskeletal:        General: Normal range of motion.     Cervical back: Normal range of motion and neck supple.  Skin:    General: Skin is dry.     Findings: No rash.  Neurological:     Mental Status: She is alert and oriented to person, place, and time. Mental status is at baseline.     Motor: No abnormal muscle tone.  Psychiatric:        Mood and Affect: Mood and affect normal.     Lab    Latest Ref Rng & Units 03/07/2024   12:48 PM 09/07/2023    1:04 PM 05/19/2023   10:47 AM  CBC  WBC 4.0 - 10.5 K/uL 5.7  5.8  6.6   Hemoglobin 12.0 - 15.0 g/dL 85.2  85.5  83.8   Hematocrit 36.0 - 46.0 % 45.0  44.4  50.4   Platelets 150 - 400 K/uL 176  179  185       Latest Ref Rng & Units 03/07/2024   12:48 PM 09/07/2023    1:03 PM 05/19/2023   10:47 AM  CMP  Glucose 70 - 99 mg/dL 867  897  95   BUN 8 - 23 mg/dL 20  22  21    Creatinine 0.44 - 1.00 mg/dL 9.26  9.33  9.17   Sodium 135 - 145 mmol/L 137  137  140   Potassium 3.5 - 5.1 mmol/L 4.9  4.2  5.2   Chloride 98 - 111 mmol/L 105  104  100   CO2 22 - 32 mmol/L 26  25  26    Calcium  8.9 - 10.3 mg/dL 9.1  9.2  89.8   Total Protein 6.5 - 8.1 g/dL 6.5  6.7  6.9   Total Bilirubin 0.0 - 1.2 mg/dL  0.5  0.6  0.4   Alkaline Phos 38 - 126 U/L 37  33  43   AST 15 - 41 U/L 26  25  25    ALT 0 - 44 U/L 18  16  17

## 2024-03-07 NOTE — Assessment & Plan Note (Addendum)
#  Lung nodule found on chest x-ray and confirmed on CT scan, no FDG activity on PET scan.  Presumed lung cancer. Patient is status post SBRT finished in December 2023 Surveillance CT scan was ordered and  scheduled via radiation oncologist office.

## 2024-03-07 NOTE — Assessment & Plan Note (Addendum)
#  Left breast DCIS with microinvasive component, pTMi pN0 Status post lumpectomy.  No sentinel lymph node biopsy was done. S/p RT Labs reviewed and discussed with patient. Patient remains in good condition for her age.  She desires to follow standard care recommendation. Continue tamoxifen  20 mg daily.  Plan 5 years-- end August 2027 Continue asprin 81mg  daily for thrombosis prophylaxis.  annual bilateral diagnostic mammogram in April 2026 I recommend annual pelvic examination while on tamoxifen . Follow up annually with gynecologist.

## 2024-04-04 DIAGNOSIS — D2261 Melanocytic nevi of right upper limb, including shoulder: Secondary | ICD-10-CM | POA: Diagnosis not present

## 2024-04-04 DIAGNOSIS — D2271 Melanocytic nevi of right lower limb, including hip: Secondary | ICD-10-CM | POA: Diagnosis not present

## 2024-04-04 DIAGNOSIS — D2262 Melanocytic nevi of left upper limb, including shoulder: Secondary | ICD-10-CM | POA: Diagnosis not present

## 2024-04-04 DIAGNOSIS — D2272 Melanocytic nevi of left lower limb, including hip: Secondary | ICD-10-CM | POA: Diagnosis not present

## 2024-04-04 DIAGNOSIS — Z85820 Personal history of malignant melanoma of skin: Secondary | ICD-10-CM | POA: Diagnosis not present

## 2024-04-04 DIAGNOSIS — Z85828 Personal history of other malignant neoplasm of skin: Secondary | ICD-10-CM | POA: Diagnosis not present

## 2024-04-18 DIAGNOSIS — Z23 Encounter for immunization: Secondary | ICD-10-CM | POA: Diagnosis not present

## 2024-05-07 ENCOUNTER — Other Ambulatory Visit: Payer: Self-pay | Admitting: Physician Assistant

## 2024-05-08 DIAGNOSIS — Z681 Body mass index (BMI) 19 or less, adult: Secondary | ICD-10-CM | POA: Diagnosis not present

## 2024-05-08 DIAGNOSIS — S81812A Laceration without foreign body, left lower leg, initial encounter: Secondary | ICD-10-CM | POA: Diagnosis not present

## 2024-05-17 ENCOUNTER — Telehealth: Payer: Self-pay | Admitting: Physician Assistant

## 2024-05-17 NOTE — Telephone Encounter (Signed)
 Left vm and sent mychart message to confirm 05/24/24 appointment-Toni

## 2024-05-21 ENCOUNTER — Ambulatory Visit: Payer: Medicare Other | Admitting: Physician Assistant

## 2024-05-24 ENCOUNTER — Ambulatory Visit: Admitting: Physician Assistant

## 2024-05-30 ENCOUNTER — Other Ambulatory Visit: Payer: Self-pay | Admitting: Oncology

## 2024-06-14 ENCOUNTER — Telehealth: Payer: Self-pay | Admitting: Physician Assistant

## 2024-06-14 NOTE — Telephone Encounter (Signed)
 Left vm and sent mychart message to confirm 06/21/24 appointment-Toni

## 2024-06-21 ENCOUNTER — Ambulatory Visit: Admitting: Physician Assistant

## 2024-07-06 ENCOUNTER — Other Ambulatory Visit: Payer: Self-pay | Admitting: Physician Assistant

## 2024-07-06 DIAGNOSIS — J4489 Other specified chronic obstructive pulmonary disease: Secondary | ICD-10-CM

## 2024-08-08 ENCOUNTER — Other Ambulatory Visit: Payer: Self-pay | Admitting: Physician Assistant

## 2024-08-10 ENCOUNTER — Other Ambulatory Visit: Payer: Self-pay

## 2024-08-10 MED ORDER — AMLODIPINE BESYLATE 2.5 MG PO TABS: 2.5000 mg | ORAL_TABLET | Freq: Every day | ORAL | 3 refills | Status: AC

## 2024-09-05 ENCOUNTER — Ambulatory Visit: Admitting: Oncology

## 2024-09-05 ENCOUNTER — Other Ambulatory Visit

## 2024-09-17 ENCOUNTER — Ambulatory Visit: Admitting: Oncology

## 2024-11-29 ENCOUNTER — Other Ambulatory Visit

## 2024-12-06 ENCOUNTER — Ambulatory Visit: Admitting: Radiation Oncology
